# Patient Record
Sex: Male | Born: 1950
Health system: Southern US, Community
[De-identification: ages and names within clinical notes are randomized; demographics above are authoritative.]

## PROBLEM LIST (undated history)

## (undated) DIAGNOSIS — N400 Enlarged prostate without lower urinary tract symptoms: Secondary | ICD-10-CM

## (undated) DIAGNOSIS — C61 Malignant neoplasm of prostate: Secondary | ICD-10-CM

## (undated) DIAGNOSIS — F419 Anxiety disorder, unspecified: Secondary | ICD-10-CM

## (undated) DIAGNOSIS — E785 Hyperlipidemia, unspecified: Secondary | ICD-10-CM

## (undated) DIAGNOSIS — M1990 Unspecified osteoarthritis, unspecified site: Secondary | ICD-10-CM

## (undated) DIAGNOSIS — T7840XA Allergy, unspecified, initial encounter: Secondary | ICD-10-CM

## (undated) DIAGNOSIS — R7303 Prediabetes: Secondary | ICD-10-CM

## (undated) DIAGNOSIS — K635 Polyp of colon: Secondary | ICD-10-CM

## (undated) DIAGNOSIS — I10 Essential (primary) hypertension: Secondary | ICD-10-CM

## (undated) DIAGNOSIS — Z87442 Personal history of urinary calculi: Secondary | ICD-10-CM

## (undated) DIAGNOSIS — N200 Calculus of kidney: Secondary | ICD-10-CM

## (undated) DIAGNOSIS — N402 Nodular prostate without lower urinary tract symptoms: Secondary | ICD-10-CM

## (undated) DIAGNOSIS — E669 Obesity, unspecified: Secondary | ICD-10-CM

## (undated) DIAGNOSIS — K5790 Diverticulosis of intestine, part unspecified, without perforation or abscess without bleeding: Secondary | ICD-10-CM

## (undated) HISTORY — DX: Polyp of colon: K63.5

## (undated) HISTORY — DX: Hyperlipidemia, unspecified: E78.5

## (undated) HISTORY — PX: TRANSURETHRAL RESECTION OF PROSTATE: SHX73

## (undated) HISTORY — DX: Unspecified osteoarthritis, unspecified site: M19.90

## (undated) HISTORY — PX: HERNIA REPAIR: SHX51

## (undated) HISTORY — DX: Diverticulosis of intestine, part unspecified, without perforation or abscess without bleeding: K57.90

## (undated) HISTORY — DX: Obesity, unspecified: E66.9

## (undated) HISTORY — DX: Allergy, unspecified, initial encounter: T78.40XA

## (undated) HISTORY — DX: Benign prostatic hyperplasia without lower urinary tract symptoms: N40.0

## (undated) HISTORY — PX: JOINT REPLACEMENT: SHX530

## (undated) HISTORY — DX: Anxiety disorder, unspecified: F41.9

## (undated) HISTORY — PX: ANKLE FUSION: SHX881

## (undated) HISTORY — DX: Calculus of kidney: N20.0

## (undated) HISTORY — DX: Nodular prostate without lower urinary tract symptoms: N40.2

---
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
## 1989-06-10 HISTORY — PX: ANKLE FUSION: SHX881

## 1999-06-15 ENCOUNTER — Ambulatory Visit (HOSPITAL_COMMUNITY): Admission: RE | Admit: 1999-06-15 | Discharge: 1999-06-15 | Payer: Self-pay | Admitting: *Deleted

## 1999-06-15 ENCOUNTER — Encounter (INDEPENDENT_AMBULATORY_CARE_PROVIDER_SITE_OTHER): Payer: Self-pay | Admitting: Specialist

## 2002-01-19 ENCOUNTER — Encounter: Payer: Self-pay | Admitting: Urology

## 2002-01-19 ENCOUNTER — Encounter: Admission: RE | Admit: 2002-01-19 | Discharge: 2002-01-19 | Payer: Self-pay | Admitting: Urology

## 2002-01-28 ENCOUNTER — Ambulatory Visit (HOSPITAL_BASED_OUTPATIENT_CLINIC_OR_DEPARTMENT_OTHER): Admission: RE | Admit: 2002-01-28 | Discharge: 2002-01-28 | Payer: Self-pay | Admitting: Urology

## 2002-01-28 ENCOUNTER — Encounter (INDEPENDENT_AMBULATORY_CARE_PROVIDER_SITE_OTHER): Payer: Self-pay | Admitting: Specialist

## 2002-02-02 ENCOUNTER — Emergency Department (HOSPITAL_COMMUNITY): Admission: EM | Admit: 2002-02-02 | Discharge: 2002-02-02 | Payer: Self-pay | Admitting: Emergency Medicine

## 2002-02-07 ENCOUNTER — Emergency Department (HOSPITAL_COMMUNITY): Admission: EM | Admit: 2002-02-07 | Discharge: 2002-02-07 | Payer: Self-pay | Admitting: Emergency Medicine

## 2002-02-10 ENCOUNTER — Emergency Department (HOSPITAL_COMMUNITY): Admission: EM | Admit: 2002-02-10 | Discharge: 2002-02-10 | Payer: Self-pay | Admitting: Emergency Medicine

## 2003-08-08 ENCOUNTER — Inpatient Hospital Stay (HOSPITAL_COMMUNITY): Admission: RE | Admit: 2003-08-08 | Discharge: 2003-08-09 | Payer: Self-pay | Admitting: Urology

## 2003-08-08 ENCOUNTER — Encounter (INDEPENDENT_AMBULATORY_CARE_PROVIDER_SITE_OTHER): Payer: Self-pay | Admitting: Specialist

## 2004-08-15 ENCOUNTER — Inpatient Hospital Stay (HOSPITAL_COMMUNITY): Admission: EM | Admit: 2004-08-15 | Discharge: 2004-08-18 | Payer: Self-pay | Admitting: Emergency Medicine

## 2004-08-16 ENCOUNTER — Encounter (INDEPENDENT_AMBULATORY_CARE_PROVIDER_SITE_OTHER): Payer: Self-pay | Admitting: Specialist

## 2005-11-27 ENCOUNTER — Emergency Department (HOSPITAL_COMMUNITY): Admission: EM | Admit: 2005-11-27 | Discharge: 2005-11-28 | Payer: Self-pay | Admitting: Emergency Medicine

## 2008-01-09 ENCOUNTER — Encounter
Admission: RE | Admit: 2008-01-09 | Discharge: 2008-01-09 | Payer: Self-pay | Admitting: Physical Medicine and Rehabilitation

## 2008-07-27 DIAGNOSIS — N402 Nodular prostate without lower urinary tract symptoms: Secondary | ICD-10-CM

## 2008-07-27 HISTORY — DX: Nodular prostate without lower urinary tract symptoms: N40.2

## 2008-10-04 ENCOUNTER — Ambulatory Visit: Payer: Self-pay | Admitting: *Deleted

## 2009-06-10 HISTORY — PX: COLONOSCOPY: SHX174

## 2009-06-10 HISTORY — PX: POLYPECTOMY: SHX149

## 2009-07-31 ENCOUNTER — Encounter: Payer: Self-pay | Admitting: Internal Medicine

## 2009-08-18 ENCOUNTER — Encounter (INDEPENDENT_AMBULATORY_CARE_PROVIDER_SITE_OTHER): Payer: Self-pay | Admitting: *Deleted

## 2009-08-21 ENCOUNTER — Ambulatory Visit: Payer: Self-pay | Admitting: Internal Medicine

## 2009-09-01 ENCOUNTER — Telehealth: Payer: Self-pay | Admitting: Internal Medicine

## 2009-09-04 ENCOUNTER — Ambulatory Visit: Payer: Self-pay | Admitting: Internal Medicine

## 2009-09-04 LAB — HM COLONOSCOPY

## 2009-09-05 ENCOUNTER — Encounter: Payer: Self-pay | Admitting: Internal Medicine

## 2009-09-07 ENCOUNTER — Encounter: Payer: Self-pay | Admitting: Internal Medicine

## 2010-07-10 NOTE — Miscellaneous (Signed)
Summary: changed from moviprep to miralax  Clinical Lists Changes  Medications: Added new medication of MIRALAX   POWD (POLYETHYLENE GLYCOL 3350) As directed - Signed Added new medication of REGLAN 10 MG  TABS (METOCLOPRAMIDE HCL) As directed - Signed Added new medication of DULCOLAX 5 MG  TBEC (BISACODYL) As directed - Signed Rx of MIRALAX   POWD (POLYETHYLENE GLYCOL 3350) As directed;  #255 gms x 0;  Signed;  Entered by: Clide Cliff RN;  Authorized by: Hart Carwin MD;  Method used: [facility] [address] [phone]*, [address] [place], [place], [place]  [address], Ph: [phone] or [phone], Fax: [phone] Rx of REGLAN 10 MG  TABS (METOCLOPRAMIDE HCL) As directed;  #2 x 0;  Signed;  Entered by: Clide Cliff RN;  Authorized by: Hart Carwin MD;  Method used: [facility]ically to [facility] [address] [phone]*, [address], [place], [place], [place]  [address], Ph: [phone] or [phone], Fax: [phone] Rx of DULCOLAX 5 MG  TBEC (BISACODYL) As directed;  #4 x 1;  Signed;  Entered by: Clide Cliff RN;  Authorized by: Hart Carwin MD;  Method used: Electronically to [facility] [address] [phone]*, [address], [place], [place], [place]  [address], Ph: [phone] or [phone], Fax: [phone] Observations: Added new observation of ALLERGY REV: Done (08/21/2009 13:18)    DO NOT GIVE THE PATIENT MOVIPREP AS PREVIOUSLY ORDERED.  Prescriptions: DULCOLAX 5 MG  TBEC (BISACODYL) As directed  #4 x 1   Entered by:   Clide Cliff RN   Authorized by:   Hart Carwin MD   Signed by:   Clide Cliff RN on 08/21/2009   Method used:   Electronically to        [facility] [phone]* (retail)       [address]       [place], [place]  [address]       Ph: [phone] or [phone]       Fax: [phone]   RxID:   [id number] REGLAN 10 MG  TABS (METOCLOPRAMIDE HCL) As directed  #2  x 0   Entered by:   Clide Cliff RN   Authorized by:   Hart Carwin MD   Signed by:   Clide Cliff RN on 08/21/2009   Method used:   Electronically to        [facility] [address] [phone]* (retail)       [address]       [place], [place]  [address]       Ph: [phone] or [phone]       Fax: [phone]   RxID:   [id number] MIRALAX   POWD (POLYETHYLENE GLYCOL 3350) As directed  #255 gms x 0   Entered by:   Clide Cliff RN   Authorized by:   Hart Carwin MD   Signed by:   Clide Cliff RN on 08/21/2009   Method used:   Electronically to        [facility] [address] [phone]* (retail)       [address]       [place], [place]  [address]       Ph: [phone] or [phone]       Fax: [phone]   RxID:  [id number]  

## 2010-07-10 NOTE — Miscellaneous (Signed)
Summary: ROUTINE COL .[name] KitchenMarland KitchenEM  Clinical Lists Changes  Medications: Added new medication of MOVIPREP 100 GM  SOLR (PEG-KCL-NACL-NASULF-NA ASC-C) As directed - Signed Rx of MOVIPREP 100 GM  SOLR (PEG-KCL-NACL-NASULF-NA ASC-C) As directed;  #1 x 0;  Signed;  Entered by: Clide Cliff RN;  Authorized by: Hart Carwin MD;  Method used: Electronically to [facility] [address] [phone]*, [address] [place], [place], [place]  [address], Ph: [phone] or [phone], Fax: [phone] Allergies: Added new allergy or adverse reaction of ASA Added new allergy or adverse reaction of * STATINS Observations: Added new observation of NKA: F (08/21/2009 12:46)    Prescriptions: MOVIPREP 100 GM  SOLR (PEG-KCL-NACL-NASULF-NA ASC-C) As directed  #1 x 0   Entered by:   Clide Cliff RN   Authorized by:   Hart Carwin MD   Signed by:   Clide Cliff RN on 08/21/2009   Method used:   Electronically to        [facility] [phone]* (retail)       [address]       [place], [place]  [address]       Ph: [phone] or [phone]       Fax: [phone]   RxID:   [id number]

## 2010-07-10 NOTE — Procedures (Signed)
Summary: Colonoscopy  Patient: [name] Note: All result statuses are Final unless otherwise noted.  Tests: (1) Colonoscopy (COL)   COL Colonoscopy           DONE     [facility]     [address]     [place], [place]  [address]           COLONOSCOPY PROCEDURE REPORT           PATIENT:  [name] [name], [name] [name]  MR#:  [id number]     BIRTHDATE:  1950/12/25, 59 yrs. old  GENDER:  male     ENDOSCOPIST:  Hedwig Morton. Juanda Chance, MD     REF. BY:  Robert Bellow, M.D.     PROCEDURE DATE:  09/04/2009     PROCEDURE:  Colonoscopy 19147     ASA CLASS:  Class II     INDICATIONS:  Routine Risk Screening hx of colon polyps, no     records available     MEDICATIONS:   Versed 4 mg, Fentanyl 50 mcg           DESCRIPTION OF PROCEDURE:   After the risks benefits and     alternatives of the procedure were thoroughly explained, informed     consent was obtained.  Digital rectal exam was performed and     revealed no rectal masses.   The LB CF-H180AL [id number] endoscope     was introduced through the anus and advanced to the cecum, which     was identified by the ileocecal valve, without limitations.  The     quality of the prep was good, using MiraLax.  The instrument was     then slowly withdrawn as the colon was fully examined.     <<PROCEDUREIMAGES>>           FINDINGS:  Three polyps were found in the sigmoid colon. 3 polyps     at 20, 28, 30 cm sizes 3-6 mm The polyp was removed using cold     biopsy forceps. Polyp was snared without cautery. Retrieval was     successful (see image1, image2, image9, and image8). snare polyp     There were mild diverticular changes in left colon. diverticulosis     was found (see image3 and image7).  This was otherwise a normal     examination of the colon (see image10, image6, image5, and     image4). cecal pouch vis from outside the pouch   Retroflexed     views in the rectum revealed no abnormalities.    The scope was     then withdrawn from the patient and  the procedure completed.           COMPLICATIONS:  None     ENDOSCOPIC IMPRESSION:     1) Three polyps in the sigmoid colon     2) Diverticulosis,mild,left sided diverticulosis     3) Otherwise normal examination     RECOMMENDATIONS:     1) Await pathology results     2) high fiber diet     REPEAT EXAM:  In 5 year(s) for.           ______________________________     Hedwig Morton. Juanda Chance, MD           CC:           n.     eSIGNED:   Hedwig Morton. Brodie at 09/04/2009 10:11 AM  Dekker, Verga, [phone]  Note: An exclamation mark (!) indicates a result that was not dispersed into the flowsheet. Document Creation Date: 09/04/2009 10:12 AM _______________________________________________________________________  (1) Order result status: Final Collection or observation date-time: 09/04/2009 10:03 Requested date-time:  Receipt date-time:  Reported date-time:  Referring Physician:   Ordering Physician: Lina Sar [phone]) Specimen Source:  Source: Launa Grill Order Number: [id number] Lab site:   Appended Document: Colonoscopy     Procedures Next Due Date:    Colonoscopy: 08/2016

## 2010-07-10 NOTE — Progress Notes (Signed)
Summary: Not sure which prep to do  Phone Note Call from Patient Call back at Home Phone [phone]   Call For: Dr Juanda Chance Reason for Call: Talk to Nurse Summary of Call: Got Moviprep from drugstore but Miralax instructions. Initial call taken by: Leanor Kail [facility],  September 01, 2009 9:56 AM  Follow-up for Phone Call        Spoke with patient, encouraged him to take back moviprep. he states he picked both preps up. told patient he should use the miralax prep becaused this is the prep instructions he has, he agrees. Follow-up by: Sherren Kerns RN,  September 01, 2009 10:06 AM

## 2010-07-10 NOTE — Letter (Signed)
Summary: Patient Notice- Polyp Results  [facility]  [address] [place], [place] [address]   Phone: [phone]  Fax: [phone]        September 07, 2009 MRN: [id number]    [name] [name] [address] [place], [place]  [address]    Dear [name],  I am pleased to inform you that the colon polyp(s) removed during your recent colonoscopy was (were) found to be benign (no cancer detected) upon pathologic examination.The polyp was hyperplastic ( not precancerous)  I recommend you have a repeat colonoscopy examination in 7_ years to look for recurrent polyps, as having colon polyps increases your risk for having recurrent polyps or even colon cancer in the future.  Should you develop new or worsening symptoms of abdominal pain, bowel habit changes or bleeding from the rectum or bowels, please schedule an evaluation with either your primary care physician or with me.  Additional information/recommendations:  _x_ No further action with gastroenterology is needed at this time. Please      follow-up with your primary care physician for your other healthcare      needs.  __ Please call [phone] to schedule a return visit to review your      situation.  __ Please keep your follow-up visit as already scheduled.  __ Continue treatment plan as outlined the day of your exam.  Please call us if you are having persistent problems or have questions about your condition that have not been fully answered at this time.  Sincerely,  Hart Carwin MD  This letter has been electronically signed by your physician.  Appended Document: Patient Notice- Polyp Results Letter mailed 4.1.11

## 2010-07-10 NOTE — Letter (Signed)
Summary: [facility] Instructions  [facility]  [address] [place], [place] [address]   Phone: [phone]  Fax: [phone]       [name] [name]    [birth year]    MRN: [id number]       Procedure Day /Date:  Monday 09/04/2009     Arrival Time:  8:00 am     Procedure Time: 9:00 am     Location of Procedure:                    _x _  [facility] (4th Floor)    PREPARATION FOR COLONOSCOPY WITH MIRALAX  Starting 5 days prior to your procedure Wednesday 3/23 do not eat nuts, seeds, popcorn, corn, beans, peas,  salads, or any raw vegetables.  Do not take any fiber supplements (e.g. Metamucil, Citrucel, and Benefiber). ____________________________________________________________________________________________________   THE DAY BEFORE YOUR PROCEDURE         DATE: Sunday 3/27  1   Drink clear liquids the entire day-NO SOLID FOOD  2   Do not drink anything colored red or purple.  Avoid juices with pulp.  No orange juice.  3   Drink at least 64 oz. (8 glasses) of fluid/clear liquids during the day to prevent dehydration and help the prep work efficiently.  CLEAR LIQUIDS INCLUDE: Water Jello Ice Popsicles Tea (sugar ok, no milk/cream) Powdered fruit flavored drinks Coffee (sugar ok, no milk/cream) Gatorade Juice: apple, white grape, white cranberry  Lemonade Clear bullion, consomm, broth Carbonated beverages (any kind) Strained chicken noodle soup Hard Candy  4   Mix the entire bottle of Miralax with 64 oz. of Gatorade/Powerade in the morning and put in the refrigerator to chill.  5   At 3:00 pm take 2 Dulcolax/Bisacodyl tablets.  6   At 4:30 pm take one Reglan/Metoclopramide tablet.  7  Starting at 5:00 pm drink one 8 oz glass of the Miralax mixture every 15-20 minutes until you have finished drinking the entire 64 oz.  You should finish drinking prep around 7:30 or 8:00 pm.  8   If you are nauseated, you may take the 2nd Reglan/Metoclopramide  tablet at 6:30 pm.        9    At 8:00 pm take 2 more DULCOLAX/Bisacodyl tablets.     THE DAY OF YOUR PROCEDURE      DATE:  Monday 3/28  You may drink clear liquids until 7:00 am  (2 HOURS BEFORE PROCEDURE).   MEDICATION INSTRUCTIONS  Unless otherwise instructed, you should take regular prescription medications with a small sip of water as early as possible the morning of your procedure.  Diabetic patients - see separate instructions.   Additional medication instructions:  hold lisinopril/hctz the am of the procedure.         OTHER INSTRUCTIONS  You will need a responsible adult at least 58 years of age to accompany you and drive you home.   This person must remain in the waiting room during your procedure.  Wear loose fitting clothing that is easily removed.  Leave jewelry and other valuables at home.  However, you may wish to [name] a book to read or an iPod/MP3 player to listen to music as you wait for your procedure to start.  Remove all body piercing jewelry and leave at home.  Total time from sign-in until discharge is approximately 2-3 hours.  You should go home directly after your procedure and rest.  You can resume normal  activities the day after your procedure.  The day of your procedure you should not:   Drive   Make legal decisions   Operate machinery   Drink alcohol   Return to work  You will receive specific instructions about eating, activities and medications before you leave.   The above instructions have been reviewed and explained to me by   Clide Cliff, RN______________________    I fully understand and can verbalize these instructions _____________________________ Date _______

## 2010-07-10 NOTE — Letter (Signed)
Summary: [facility]  [facility]   Imported By: Lester [place] 09/20/2009 07:53:19  _____________________________________________________________________  External Attachment:    Type:   Image     Comment:   External Document

## 2010-07-10 NOTE — Letter (Signed)
Summary: Patient Notice- Polyp Results   [facility]  [address] [place], [place] [address]   Phone: [phone]  Fax: [phone]        September 05, 2009 MRN: [id number]    [name] [name] [address] [place], [place]  [address]    Dear [name],  I am pleased to inform you that the colon polyp(s) removed during your recent colonoscopy was (were) found to be benign (no cancer detected) upon pathologic examination.All polyps were hyperplastic ( not precancerous)  I recommend you have a repeat colonoscopy examination in 7_ years to look for recurrent polyps, as having colon polyps increases your risk for having recurrent polyps or even colon cancer in the future.  Should you develop new or worsening symptoms of abdominal pain, bowel habit changes or bleeding from the rectum or bowels, please schedule an evaluation with either your primary care physician or with me.  Additional information/recommendations:  _7_ No further action with gastroenterology is needed at this time. Please      follow-up with your primary care physician for your other healthcare      needs.  __ Please call [phone] to schedule a return visit to review your      situation.  __ Please keep your follow-up visit as already scheduled.  __ Continue treatment plan as outlined the day of your exam.  Please call us if you are having persistent problems or have questions about your condition that have not been fully answered at this time.  Sincerely,  Hart Carwin MD  This letter has been electronically signed by your physician.  Appended Document: Patient Notice- Polyp Results letter mailed 3.30.11

## 2010-10-23 NOTE — Procedures (Signed)
DUPLEX DEEP VENOUS EXAM - LOWER EXTREMITY   INDICATION:  Bilateral lower extremity pain.   HISTORY:  Edema:  No.  Trauma/Surgery:  No.  Pain:  Yes.  PE:  No.  Previous DVT:  No.  Anticoagulants:  No.  Other:   DUPLEX EXAM:                CFV   SFV   PopV  PTV    GSV                R  L  R  L  R  L  R   L  R  L  Thrombosis    o  o  o  o  o  o  o   o  o  o  Spontaneous   +  +  +  +  +  +  +   +  +  +  Phasic        +  +  +  +  +  +  +   +  +  +  Augmentation  +  +  +  +  +  +  +   +  +  +  Compressible  +  +  +  +  +  +  +   +  +  +  Competent     +  +  +  +  +  +  +   +  +  +   Legend:  + - yes  o - no  p - partial  D - decreased   IMPRESSION:  No evidence of deep or superficial vein thrombosis.    _____________________________  P. Liliane Bade, M.D.   AC/MEDQ  D:  10/04/2008  T:  10/04/2008  Job:  [id number]

## 2010-10-26 NOTE — Consult Note (Signed)
   NAMEFRANKIE, SCIPIO NO.:  [id number]   MEDICAL RECORD NO.:  [id number]                   PATIENT TYPE:  EMS   LOCATION:  ED                                   FACILITY:  [facility]   PHYSICIAN:  Lindaann Slough, M.D.               DATE OF BIRTH:  19-Sep-1950   DATE OF CONSULTATION:  DATE OF DISCHARGE:                                   CONSULTATION   REASON FOR CONSULTATION:  Urinary retention and gross hematuria.   HISTORY OF PRESENT ILLNESS:  The patient is a 60 year old male who has been  having difficulty voiding since yesterday. He has been voiding small amounts  of urine at a time. He was seen in the emergency room this evening and was  catheterized for 1200 cc of bloody urine. The patient had incision of  bladder neck and removal of bladder stone on January 28, 2002. He went into  urinary retention after the removal of the Foley catheter and the catheter  was left indwelling for a few days. It was removed again four days ago and  he started having difficulty voiding yesterday. He has been on Flomax.   PHYSICAL EXAMINATION:  VITAL SIGNS: Blood pressure is 157/110, pulse 66,  respiratory rate 16, temperature 97.4.  GU: Penis is uncircumcised. Meatus is normal. Scrotum unremarkable.  Testicles and cords and epididymis are within normal limits.  RECTAL: Examination is deferred.   IMPRESSION:  1. Clots with urinary retention.  2. Benign prostatic hypertrophy.  3. Bladder stones.   SUGGESTION:  1. Discharge to home with Foley catheter to leg bag.  2. Septra DS one tablet twice a day.  3. Increase Flomax to 2 capsules daily at HS.  4. Follow-up with Dr. Patsi Sears.                                                 Lindaann Slough, M.D.    MN/MEDQ  D:  02/07/2002  T:  02/07/2002  Job:  [id number]

## 2010-10-26 NOTE — Op Note (Signed)
[name] [name], [name] [name] NO.:  [id number]   MEDICAL RECORD NO.:  [id number]          PATIENT TYPE:  INP   LOCATION:  0349                         FACILITY:  [facility]   PHYSICIAN:  Georgiana Spinner, M.D.    DATE OF BIRTH:  12/20/1950   DATE OF PROCEDURE:  08/16/2004  DATE OF DISCHARGE:                                 OPERATIVE REPORT   PROCEDURE:  Upper endoscopy.   INDICATIONS:  Acute gastrointestinal bleeding for which I admitted him to  the hospital.   ANESTHESIA:  Demerol 50 mg, Versed 5 mg.   PROCEDURE:  With the patient mildly sedated in the left lateral decubitus  position, the Olympus videoscopic endoscope was inserted and passed under  direct vision through the esophagus which appeared normal into the stomach.  Fundus, body, and antrum appeared normal other than what appeared to be  either a pancreatic rest or umbilicated fold in the antrum which was  photographed and biopsied. We entered into the duodenal bulb and second  portion duodenum, both which appeared normal. From this point, the endoscope  was slowly withdrawn taking circumferential views of duodenal mucosa until  the endoscope had been pulled back in the stomach placed in retroflexion to  review the stomach from below. The endoscope was straightened and withdrawn,  taking circumferential views of remaining gastric esophageal mucosa. The  patient's vital signs and pulse oximeter remained stable. The patient  tolerated procedure well without apparent complications.   FINDINGS:  Umbilicated fold versus pancreatic rest of the antrum biopsied  and photographed. Await biopsy report. No evidence of acute gastrointestinal  bleeding. Plan will be to proceed with unprepped colonoscopy.      GMO/MEDQ  D:  08/16/2004  T:  08/16/2004  Job:  [id number]   cc:   Jonita Albee, M.D.  [facility]  [address]  [place]  [place] [address]  Fax: [phone]

## 2010-10-26 NOTE — Op Note (Signed)
NAMERAMBO, SARAFIAN NO.:  [id number]   MEDICAL RECORD NO.:  [id number]          PATIENT TYPE:  INP   LOCATION:  0349                         [facility]ACILITY[facility]  [facility]   PHYSICIAN:  Georgiana Spinner, M.D.    DATE OF BIRTH:  November 22, 1950   DATE OF PROCEDURE:  08/16/2004  DATE OF DISCHARGE:                                 OPERATIVE REPORT   PROCEDURE:  Colonoscopy.   INDICATIONS:  Rectal bleeding.   ANESTHESIA:  Demerol 25, Versed 2 mg.   PROCEDURE:  With the patient mildly sedated in the left lateral decubitus  position, the Olympus videoscopic colonoscope was inserted into the rectum  and passed under direct vision to above the splenic flexure. It was noted  that there was a transition from blood to brown at about the level of the  splenic flexure at approximately 50-60 cm of advancement from the anal verge  on the colonoscope.  We advanced further and the stool was clearly brown in  the transverse colon and became more copious. I felt that we had reached the  limit of benefit from this procedure. The endoscope was then withdrawn  taking circumferential views of the remaining colonic mucosa. The endoscope  was withdrawn. The patient's vital signs and pulse oximeter remained stable.  The patient tolerated procedure well without apparent complications.   FINDINGS:  No active bleeding at this time but blood seen in the left colon  from approximately the splenic flexure distally and brown stool seen  proximal to this.   PLAN:  Prep the patient for a colonoscopy which will plan on doing and  proceeding to do tomorrow.      GMO/MEDQ  D:  08/16/2004  T:  08/16/2004  Job:  [id number]   cc:   Jonita Albee, M.D.  [facility]  [address]  [place]  [place] [address]  Fax: [phone]

## 2010-10-26 NOTE — Op Note (Signed)
[name] [name], CELESTINE NO.:  [id number]   MEDICAL RECORD NO.:  [id number]                   PATIENT TYPE:  INP   LOCATION:  0342                                 FACILITY:  [facility]   PHYSICIAN:  Sigmund I. Patsi Sears, M.D.         DATE OF BIRTH:  [birth year]   DATE OF PROCEDURE:  08/08/2003  DATE OF DISCHARGE:  08/09/2003                                 OPERATIVE REPORT   PREOPERATIVE DIAGNOSES:  Benign prostatic hypertrophy and urethral  stricture.   POSTOPERATIVE DIAGNOSES:  Benign prostatic hypertrophy and urethral  stricture.   OPERATION:  Urethral dilation and transurethral resection of prostate.   SURGEON:  Sigmund I. Patsi Sears, M.D.   ANESTHESIA:  Spinal.   PREPARATION:  After appropriate preanesthesia, the patient was brought to  the operating room, placed on the operating table in the dorsal supine  position.  He was then replaced in the right lateral decubitus position  where a spinal anesthetic was introduced. He was then replaced in dorsal  lithotomy position where the pubis was prepped with Betadine solution and  draped in the usual fashion.   DESCRIPTION OF PROCEDURE:  Cystourethroscopy revealed dense distal urethral  stricture, and this was dilated with Sissy Hoff sounds. Following this,  cystoscopy revealed a large prostate which was resected beginning at the 11  o'clock to 7 o'clock position, and from the 2 o'clock and the 5 o'clock  positions. Chips were sent to the laboratory for evaluation. Following this,  a 71 Ainsworth catheter was passed, and the patient was then taken to the  recovery room in good condition.                                               Sigmund I. Patsi Sears, M.D.    SIT/MEDQ  D:  10/13/2003  T:  10/13/2003  Job:  [id number]

## 2010-10-26 NOTE — Op Note (Signed)
   [name] [name], [name] [name]                         ACCOUNT NO.:  [id number]   MEDICAL RECORD NO.:  [id number]                   PATIENT TYPE:  AMB   LOCATION:  DAY                                  FACILITY:  [facility]   PHYSICIAN:  Sigmund I. Patsi Sears, M.D.         DATE OF BIRTH:  01/27/1951   DATE OF PROCEDURE:  02/11/2002  DATE OF DISCHARGE:                                 OPERATIVE REPORT   PREOPERATIVE DIAGNOSIS:  Bladder neck obstruction with bladder stones.   POSTOPERATIVE DIAGNOSIS:  Bladder neck obstruction with bladder stones.   OPERATION:  1. Cystourethroscopy.  2. Cystolithalopaxy.  3. Transurethral bladder neck resection.   SURGEON:  Sigmund I. Patsi Sears, M.D.   ANESTHESIA:  General endotracheal.   PREPARATION:  After appropriate preanesthesia, the patient is brought to the  operating room and placed on the operating table in the dorsal supine  position where general endotracheal anesthesia was introduced.  He was then  re-placed in the dorsal lithotomy position where the pubis was prepped with  Betadine solution and draped in the usual fashion.   DESCRIPTION OF PROCEDURE:  Cystourethroscopy revealed a massive amount of  small egg-like stones within the bladder.  The bladder neck was markedly  elevated.  The bladder neck was first incised with a Collins knife then  resected with resectoscope, keeping the verumontanum in position through the  entire case.  Following this, bleeding was electrocoagulated, and multiple  stones were photographed and then removed with the piston syringe.  A Foley  catheter was placed to mild traction, but no continuous irrigation was  necessary.  The patient was awakened and taken to the recovery room in good  condition.                                               Sigmund I. Patsi Sears, M.D.    SIT/MEDQ  D:  03/19/2002  T:  03/19/2002  Job:  [id number]

## 2010-10-26 NOTE — Op Note (Signed)
NAMEMUNG, RINKER NO.:  [id number]   MEDICAL RECORD NO.:  [id number]          PATIENT TYPE:  INP   LOCATION:  0349                         FACILITY:  [facility]   PHYSICIAN:  Georgiana Spinner, M.D.    DATE OF BIRTH:  09-Mar-1951   DATE OF PROCEDURE:  08/17/2004  DATE OF DISCHARGE:                                 OPERATIVE REPORT   PROCEDURE:  Colonoscopy.   INDICATIONS:  Rectal bleeding.   ANESTHESIA:  Demerol 60, Versed 6 mg.   INDICATIONS FOR PROCEDURE:  Acute GI bleed.   DESCRIPTION OF PROCEDURE:  With the patient mildly sedated in the left  lateral decubitus position after a prep of MiraLax and Gatorade, the Olympus  videoscopic colonoscope was inserted into the rectum and passed under direct  vision to the cecum identified by ileocecal valve and appendiceal orifice  both of which were photographed. From this point the colonoscope was slowly  withdrawn taking circumferential views of colonic mucosa stopping to  photograph diverticulosis seen in the sigmoid colon which was moderately  significant until we reached the rectum which appeared normal on direct  showed and showed hemorrhoids on retroflexed view.  The endoscope was  straightened and withdrawn. The patient's vital signs, pulse oximeter  remained stable. The patient tolerated procedure well without apparent  complications.   FINDINGS:  Diverticulosis of the sigmoid colon, internal hemorrhoids  otherwise an unremarkable examination.   IMPRESSION:  Probable diverticular bleed now stopped.   PLAN:  If the patient remains stable will plan on discharge in the next 24  hours. Start the patient on iron therapy and have him follow-up with me as  an outpatient.      GMO/MEDQ  D:  08/17/2004  T:  08/17/2004  Job:  [id number]   cc:   Jonita Albee, M.D.  [facility]  [address]  [place]  [place] [address]  Fax: [phone]

## 2010-10-26 NOTE — Discharge Summary (Signed)
NAMETAYSHAWN, PURNELL NO.:  [id number]   MEDICAL RECORD NO.:  [id number]          PATIENT TYPE:  INP   LOCATION:  0349                         FACILITY:  [facility]   PHYSICIAN:  Georgiana Spinner, M.D.    DATE OF BIRTH:  12-Sep-1950   DATE OF ADMISSION:  08/15/2004  DATE OF DISCHARGE:  08/18/2004                                 DISCHARGE SUMMARY   Mr. Barto is a very pleasant 60 year old gentleman whom I had seen  previously, who was admitted to the hospital for acute gastrointestinal  bleeding.   HISTORY:  The patient was seen as an outpatient, had complained of some  small amount of rectal bleeding; however, that evening and the following  morning, the patient started bleeding significantly and was admitted to the  hospital on the 8th of March.  His past medical history is notable for  hypertension.  He was on aspirin and Lisinopril as his only medication, and  he had an allergy TO UNCOATED ASPIRIN.   FAMILY HISTORY:  Noncontributory.   No history of alcohol or tobacco use.  His only complaint besides the  bleeding was lower abdominal cramping.  When he was admitted, his pulse was  102 lying, went to 123 standing.  Blood pressure remained stable at 126/76,  and examination was notable only for red blood on rectal exam.  The patient  received during the hospital course 3 units of packed red blood cells.  On  the following morning, he underwent endoscopy and an unprepped colonoscopy.  Endoscopy was completely negative.  Colonoscopy revealed blood in the left  colon which turned to brown at the splenic flexure.  The following day, we  did a prepped colonoscopy since he remained fairly stable at that time and,  at that point, we found diverticulosis of the sigmoid colon, moderately  severe and internal hemorrhoids the only finding.  The patient remained  stable and was discharged the following day, March 11, for outpatient follow  up.  At that time, instructions were to  hold Lisinopril for 48 hours, drink  fluids, start on iron sulfate 325 mg t.i.d. with meals, and hold his aspirin  for 2 weeks.  Follow up will be with me in approximately 6 weeks as an  outpatient.   LABORATORY DATA:  Laboratory studies at this time revealed the following:  At discharge, his hemoglobin is 11.2, hematocrit 32.4.  His CMP on admission  showed a total protein of 5.5 with an albumin of 3, both slightly low.  Otherwise, an unremarkable examination.  His pro time PTT was normal.      GMO/MEDQ  D:  08/18/2004  T:  08/18/2004  Job:  [id number]   cc:   Jonita Albee, M.D.  [facility]  [address]  [place]  [place] [address]  Fax: [phone]

## 2011-01-28 LAB — LIPID PANEL
Cholesterol: 227 mg/dL — AB (ref 0–200)
LDl/HDL Ratio: 5.3

## 2011-03-18 ENCOUNTER — Emergency Department (HOSPITAL_BASED_OUTPATIENT_CLINIC_OR_DEPARTMENT_OTHER)
Admission: EM | Admit: 2011-03-18 | Discharge: 2011-03-18 | Disposition: A | Discharge status: Home / Self Care | Payer: BC Managed Care – PPO | Attending: Emergency Medicine | Admitting: Emergency Medicine

## 2011-03-18 ENCOUNTER — Encounter: Payer: Self-pay | Admitting: *Deleted

## 2011-03-18 DIAGNOSIS — E7800 Pure hypercholesterolemia, unspecified: Secondary | ICD-10-CM | POA: Insufficient documentation

## 2011-03-18 DIAGNOSIS — M5432 Sciatica, left side: Secondary | ICD-10-CM

## 2011-03-18 DIAGNOSIS — M25559 Pain in unspecified hip: Secondary | ICD-10-CM | POA: Insufficient documentation

## 2011-03-18 DIAGNOSIS — M5430 Sciatica, unspecified side: Secondary | ICD-10-CM | POA: Insufficient documentation

## 2011-03-18 DIAGNOSIS — I10 Essential (primary) hypertension: Secondary | ICD-10-CM | POA: Insufficient documentation

## 2011-03-18 DIAGNOSIS — Z79899 Other long term (current) drug therapy: Secondary | ICD-10-CM | POA: Insufficient documentation

## 2011-03-18 HISTORY — DX: Essential (primary) hypertension: I10

## 2011-03-18 MED ORDER — DIAZEPAM 5 MG PO TABS
5.0000 mg | ORAL_TABLET | Freq: Once | ORAL | Status: AC
  Administered 2011-03-18: 5 mg via ORAL
  Filled 2011-03-18: qty 1

## 2011-03-18 MED ORDER — IBUPROFEN 600 MG PO TABS: 600.0000 mg | ORAL_TABLET | Freq: Four times a day (QID) | ORAL | Status: AC | PRN

## 2011-03-18 MED ORDER — OXYCODONE-ACETAMINOPHEN 5-325 MG PO TABS: 1.0000 | ORAL_TABLET | ORAL | Status: AC | PRN

## 2011-03-18 MED ORDER — HYDROMORPHONE HCL 2 MG/ML IJ SOLN
2.0000 mg | Freq: Once | INTRAMUSCULAR | Status: AC
  Administered 2011-03-18: 2 mg via INTRAMUSCULAR
  Filled 2011-03-18: qty 1

## 2011-03-18 NOTE — ED Notes (Signed)
Dr. Campos at bedside   

## 2011-03-18 NOTE — ED Notes (Signed)
Pt able to ambulate around room. States the pain is at 7/10, but tolerable.

## 2011-03-18 NOTE — ED Notes (Signed)
Pt states he began a new workout routine last week, and was riding the bike and doing stair steps last Friday. Pain began that next morning to his left hip and thigh. Took tylenol yesterday without relief. Pt reports the discomfort/pain continues despite resting his leg for several days.

## 2011-03-18 NOTE — ED Notes (Signed)
Pt c/o lower back pain which radiates to left hip and down leg x 3 days.

## 2011-03-18 NOTE — ED Provider Notes (Signed)
History    Scribed for Lyanne Co, MD, the patient was seen in room MH07/MH07. This chart was scribed by Katha Cabal. This patient's care was started at 9:54 PM.  CSN: [id number] Arrival date & time: 03/18/2011  8:38 PM  Chief Complaint  Patient presents with  . Back Pain  . Hip Pain    (Consider location/radiation/quality/duration/timing/severity/associated sxs/prior treatment) HPI  [name] [name] is a 60 y.o. male who presents to the Emergency Department complaining of constant left hip pain that radiates to knee that began 3 days ago.  Denies weakness, numbness, urinary and bowel incontientce, painful BMs, fever, chills. back pain, abdominal pain, heavy lifting and hx of back surgery. Pain worsens while standing.  Tylenol doesn't help pain.  Per Wife:  Patient has been working out at [employer] Patient with hx of HTN, gout, and cholesterolemia and is compliant with medication.  No hx of AAA. Denies abdominal pain.  PCP Dr. Celedonio Savage      PAST MEDICAL HISTORY:  Past Medical History  Diagnosis Date  . Hypertension     PAST SURGICAL HISTORY:  Past Surgical History  Procedure Date  . Joint replacement   . Hernia repair     FAMILY HISTORY:  History reviewed. No pertinent family history.   SOCIAL HISTORY: History   Social History  . Marital Status: Married    Spouse Name: N/A    Number of Children: N/A  . Years of Education: N/A   Social History Main Topics  . Smoking status: Never Smoker   . Smokeless tobacco: None  . Alcohol Use: No  . Drug Use:   . Sexually Active: No   Other Topics Concern  . None   Social History Narrative  . None    Review of Systems  All other systems reviewed and are negative.    Allergies  Aspirin and Statins  Home Medications   Current Outpatient Rx  Name Route Sig Dispense Refill  . ASPIRIN EC 81 MG PO TBEC Oral Take 81 mg by mouth daily.      . B COMPLEX PO TABS Oral Take 1 tablet by mouth daily.      [name] Kitchen VITAMIN  D 1000 UNITS PO TABS Oral Take 1,000 Units by mouth daily.      . FEBUXOSTAT 40 MG PO TABS Oral Take 40 mg by mouth daily.      . OMEGA-3 FATTY ACIDS 1000 MG PO CAPS Oral Take 1 g by mouth daily.      [name] Kitchen LISINOPRIL-HYDROCHLOROTHIAZIDE 10-12.5 MG PO TABS Oral Take 1 tablet by mouth daily.      [name] Kitchen ONE-DAILY MULTI VITAMINS PO TABS Oral Take 1 tablet by mouth daily.      . TESTOSTERONE CYPIONATE 200 MG/ML IM OIL Intramuscular Inject into the muscle as needed.        BP 156/87  Pulse 60  Temp 99.3 F (37.4 C)  Resp 16  Ht 6\' 1"  (1.854 m)  Wt 289 lb (131.09 kg)  BMI 38.13 kg/m2  SpO2 98%  Physical Exam  Nursing note and vitals reviewed. Constitutional: He is oriented to person, place, and time. He appears well-developed and well-nourished.  HENT:  Head: Normocephalic and atraumatic.  Eyes: EOM are normal.  Neck: Normal range of motion.  Pulmonary/Chest: Effort normal. No respiratory distress.  Abdominal: Soft. He exhibits no distension. There is no tenderness. There is no rebound and no guarding.  Musculoskeletal: Normal range of motion.  No C, T, L spine tenderness.  Mild tenderness of sciatic grove, mild pain with external rotation of left hip, however, able to fully range left hip  Neurological: He is alert and oriented to person, place, and time.       nlm strength of left lower extremity   Skin: Skin is warm. No rash noted.  Psychiatric: He has a normal mood and affect. Judgment normal.    ED Course  Procedures (including critical care time) OTHER DATA REVIEWED: Nursing notes, vital signs, and past medical records reviewed.   DIAGNOSTIC STUDIES: Oxygen Saturation is 100% on room air, normal by my interpretation.     LABS / RADIOLOGY:  Labs Reviewed - No data to display  No results found.   ED COURSE / COORDINATION OF CARE:  9:59 PM  Physical exam complete.  Pain Control.          MDM   MDM: Patients symptoms seem most consistent with left-sided  sciatica.  He had no symptoms to suggest cauda equina.  Doubt AAA.  Doubt aortic dissection..Doubt spinal epidural abscess.    IMPRESSION: Diagnoses that have been ruled out:  Diagnoses that are still under consideration:  Final diagnoses:  Sciatica of left side     MEDICATIONS GIVEN IN THE E.D. Scheduled Meds:    . diazepam  5 mg Oral Once  .  HYDROmorphone (DILAUDID) injection  2 mg Intramuscular Once   Continuous Infusions:     DISCHARGE MEDICATIONS: New Prescriptions   IBUPROFEN (ADVIL,MOTRIN) 600 MG TABLET    Take 1 tablet (600 mg total) by mouth every 6 (six) hours as needed for pain.   OXYCODONE-ACETAMINOPHEN (PERCOCET) 5-325 MG PER TABLET    Take 1 tablet by mouth every 4 (four) hours as needed for pain.     I personally performed the services described in this documentation, which was scribed in my presence. The recorded information has been reviewed and considered.            Lyanne Co, MD 03/18/11 [phone]

## 2011-06-11 DIAGNOSIS — Z5189 Encounter for other specified aftercare: Secondary | ICD-10-CM

## 2011-06-11 HISTORY — DX: Encounter for other specified aftercare: Z51.89

## 2011-08-02 ENCOUNTER — Encounter: Payer: Self-pay | Admitting: *Deleted

## 2011-08-02 DIAGNOSIS — E669 Obesity, unspecified: Secondary | ICD-10-CM

## 2011-08-02 DIAGNOSIS — N402 Nodular prostate without lower urinary tract symptoms: Secondary | ICD-10-CM

## 2011-08-02 DIAGNOSIS — I152 Hypertension secondary to endocrine disorders: Secondary | ICD-10-CM | POA: Insufficient documentation

## 2011-08-02 DIAGNOSIS — N400 Enlarged prostate without lower urinary tract symptoms: Secondary | ICD-10-CM | POA: Insufficient documentation

## 2011-08-02 DIAGNOSIS — N200 Calculus of kidney: Secondary | ICD-10-CM | POA: Insufficient documentation

## 2011-08-02 DIAGNOSIS — I10 Essential (primary) hypertension: Secondary | ICD-10-CM | POA: Insufficient documentation

## 2011-08-05 ENCOUNTER — Ambulatory Visit (INDEPENDENT_AMBULATORY_CARE_PROVIDER_SITE_OTHER): Payer: BC Managed Care – PPO | Admitting: Internal Medicine

## 2011-08-05 ENCOUNTER — Encounter: Payer: Self-pay | Admitting: Internal Medicine

## 2011-08-05 VITALS — BP 134/83 | HR 65 | Temp 97.4°F | Resp 20 | Ht 73.5 in | Wt 290.0 lb

## 2011-08-05 DIAGNOSIS — Z7189 Other specified counseling: Secondary | ICD-10-CM

## 2011-08-05 DIAGNOSIS — N400 Enlarged prostate without lower urinary tract symptoms: Secondary | ICD-10-CM

## 2011-08-05 DIAGNOSIS — N529 Male erectile dysfunction, unspecified: Secondary | ICD-10-CM

## 2011-08-05 DIAGNOSIS — I10 Essential (primary) hypertension: Secondary | ICD-10-CM

## 2011-08-05 DIAGNOSIS — Z79899 Other long term (current) drug therapy: Secondary | ICD-10-CM

## 2011-08-05 DIAGNOSIS — Z0000 Encounter for general adult medical examination without abnormal findings: Secondary | ICD-10-CM

## 2011-08-05 DIAGNOSIS — R030 Elevated blood-pressure reading, without diagnosis of hypertension: Secondary | ICD-10-CM

## 2011-08-05 LAB — POCT UA - MICROSCOPIC ONLY
Mucus, UA: NEGATIVE
Yeast, UA: NEGATIVE

## 2011-08-05 LAB — POCT URINALYSIS DIPSTICK
Bilirubin, UA: NEGATIVE
Glucose, UA: NEGATIVE
Ketones, UA: NEGATIVE
Leukocytes, UA: NEGATIVE
Nitrite, UA: NEGATIVE

## 2011-08-05 LAB — POCT GLYCOSYLATED HEMOGLOBIN (HGB A1C): Hemoglobin A1C: 5.5

## 2011-08-05 NOTE — Progress Notes (Signed)
  Subjective:    Patient ID: [name] [name], male    DOB: [birth year], 61 y.o.   MRN: [id number]  HPI Feels good. HTN, ED, BPH Insomnia all controlled and tx. See scanned survey.   Review of Systems  See scanned ROS    Objective:   Physical Exam Obesity Normal exam       Assessment & Plan:   Refill meds 1 year No change in meds

## 2011-08-05 NOTE — Patient Instructions (Signed)
Back Exercises Back exercises help treat and prevent back injuries. The goal of back exercises is to increase the strength of your abdominal and back muscles and the flexibility of your back. These exercises should be started when you no longer have back pain. Back exercises include:  Pelvic Tilt. Lie on your back with your knees bent. Tilt your pelvis until the lower part of your back is against the floor. Hold this position 5 to 10 sec and repeat 5 to 10 times.   Knee to Chest. Pull first 1 knee up against your chest and hold for 20 to 30 seconds, repeat this with the other knee, and then both knees. This may be done with the other leg straight or bent, whichever feels better.   Sit-Ups or Curl-Ups. Bend your knees 90 degrees. Start with tilting your pelvis, and do a partial, slow sit-up, lifting your trunk only 30 to 45 degrees off the floor. Take at least 2 to 3 seconds for each sit-up. Do not do sit-ups with your knees out straight. If partial sit-ups are difficult, simply do the above but with only tightening your abdominal muscles and holding it as directed.   Hip-Lift. Lie on your back with your knees flexed 90 degrees. Push down with your feet and shoulders as you raise your hips a couple inches off the floor; hold for 10 seconds, repeat 5 to 10 times.   Back arches. Lie on your stomach, propping yourself up on bent elbows. Slowly press on your hands, causing an arch in your low back. Repeat 3 to 5 times. Any initial stiffness and discomfort should lessen with repetition over time.   Shoulder-Lifts. Lie face down with arms beside your body. Keep hips and torso pressed to floor as you slowly lift your head and shoulders off the floor.  Do not overdo your exercises, especially in the beginning. Exercises may cause you some mild back discomfort which lasts for a few minutes; however, if the pain is more severe, or lasts for more than 15 minutes, do not continue exercises until you see your  caregiver. Improvement with exercise therapy for back problems is slow.  See your caregivers for assistance with developing a proper back exercise program. Document Released: 07/04/2004 Document Revised: 01/23/2011 Document Reviewed: 05/27/2005 [employer] Patient Information 2012 [employer]. 

## 2011-08-06 LAB — CBC WITH DIFFERENTIAL/PLATELET
Lymphocytes Relative: 38 % (ref 12–46)
Lymphs Abs: 1.8 10*3/uL (ref 0.7–4.0)
Neutrophils Relative %: 53 % (ref 43–77)
Platelets: 186 10*3/uL (ref 150–400)
RBC: 4.98 MIL/uL (ref 4.22–5.81)
WBC: 4.7 10*3/uL (ref 4.0–10.5)

## 2011-08-06 LAB — COMPREHENSIVE METABOLIC PANEL
ALT: 37 U/L (ref 0–53)
CO2: 26 mEq/L (ref 19–32)
Calcium: 9.7 mg/dL (ref 8.4–10.5)
Chloride: 105 mEq/L (ref 96–112)
Potassium: 3.8 mEq/L (ref 3.5–5.3)
Sodium: 140 mEq/L (ref 135–145)
Total Protein: 6.9 g/dL (ref 6.0–8.3)

## 2011-08-06 LAB — LIPID PANEL
Cholesterol: 211 mg/dL — ABNORMAL HIGH (ref 0–200)
VLDL: 31 mg/dL (ref 0–40)

## 2011-08-06 LAB — TSH: TSH: 1.435 u[IU]/mL (ref 0.350–4.500)

## 2011-12-14 ENCOUNTER — Ambulatory Visit (INDEPENDENT_AMBULATORY_CARE_PROVIDER_SITE_OTHER): Payer: BC Managed Care – PPO | Admitting: Family Medicine

## 2011-12-14 VITALS — BP 144/84 | HR 81 | Temp 98.0°F | Resp 18 | Ht 73.0 in | Wt 284.0 lb

## 2011-12-14 DIAGNOSIS — M5430 Sciatica, unspecified side: Secondary | ICD-10-CM

## 2011-12-14 MED ORDER — OXYCODONE-ACETAMINOPHEN 5-325 MG PO TABS: 1.0000 | ORAL_TABLET | Freq: Three times a day (TID) | ORAL | Status: DC | PRN

## 2011-12-14 MED ORDER — PREDNISONE 20 MG PO TABS: ORAL_TABLET | ORAL | Status: DC

## 2011-12-14 NOTE — Progress Notes (Signed)
This is a 61 y.o.male who developed acute sciatica in left leg yesterday.   Patient has a past history of low back pain for which she   Mcguire Gasparyan denies any urinary symptoms, bowel problems, numbness in the legs, loss of motor power. [name] had no fever.  SLinwood Malizia has tried nothing.  Past Medical History  Diagnosis Date  . Hypertension   . BPH (benign prostatic hypertrophy)   . Obesity, unspecified   . Colon polyp     diverticular bleed with transfusion  . Kidney stones     15-20  . Prostate nodule [id number]  . Hyperlipidemia   . Anxiety      Past Surgical History  Procedure Date  . Joint replacement   . Hernia repair   . Transurethral resection of prostate   . Ankle fusion     Objective: Obese middle-aged male in no acute distress.  Palpation of the back reveals no localized tenderness.  Inspection of the back: Reveals no scoliosis  Straight-leg raising: Pain reproduced in the right back with left leg straightening well seated. No pain with right straight leg raising.  Motor exam of lower extremity: No abnormal weakness.  His left ankle is fused  Reflexes: Normal left KJ, right KJ, right AJ.  Left ankle is fused  Skin exam: Fading ecchymosis left brachial radialis area.  Assessment/Plan: Acute lower back pain without acute neurological findings consistent with sciatica on left  1. Sciatica  oxyCODONE-acetaminophen (ROXICET) 5-325 MG per tablet, predniSONE (DELTASONE) 20 MG tablet

## 2011-12-14 NOTE — Patient Instructions (Addendum)

## 2011-12-16 ENCOUNTER — Encounter: Payer: Self-pay | Admitting: Family Medicine

## 2011-12-16 ENCOUNTER — Ambulatory Visit (INDEPENDENT_AMBULATORY_CARE_PROVIDER_SITE_OTHER): Payer: BC Managed Care – PPO | Admitting: Family Medicine

## 2011-12-16 ENCOUNTER — Ambulatory Visit
Admission: RE | Admit: 2011-12-16 | Discharge: 2011-12-16 | Disposition: A | Discharge status: Home / Self Care | Payer: Self-pay | Source: Ambulatory Visit | Attending: Family Medicine | Admitting: Family Medicine

## 2011-12-16 VITALS — BP 98/70 | HR 102 | Temp 97.4°F | Resp 18 | Wt 286.0 lb

## 2011-12-16 DIAGNOSIS — M5430 Sciatica, unspecified side: Secondary | ICD-10-CM

## 2011-12-16 NOTE — Progress Notes (Signed)
This 61 year old man has acute left sciatica after moving awkwardly 4 days ago. On Saturday I gave him prednisone and Percocet he said no improvement in the pain. In addition to the sharp incapacitating pain, patient has paresthesias in his left thigh. He's had no change in bowel or bladder function, no fever, no stiff neck, no trauma, no previous back surgery , and no change in his muscle strength.  Objective: Patient is lying on his right side on his exam table in obvious pain. He's been using crutches. Back is nontender and shows no bony abnormality Straight-leg raising is exquisitely tender with left leg manipulation. Patient has good strength in his left leg. He has no significant loss of sensation in that left leg to touch.  Assessment acute unremitting sciatica of left leg  Plan: Stat open MRI today

## 2011-12-17 ENCOUNTER — Telehealth: Payer: Self-pay

## 2011-12-17 ENCOUNTER — Other Ambulatory Visit: Payer: Self-pay | Admitting: Family Medicine

## 2011-12-17 ENCOUNTER — Ambulatory Visit
Admission: RE | Admit: 2011-12-17 | Discharge: 2011-12-17 | Disposition: A | Discharge status: Home / Self Care | Payer: BC Managed Care – PPO | Source: Ambulatory Visit | Attending: Family Medicine | Admitting: Family Medicine

## 2011-12-17 ENCOUNTER — Ambulatory Visit: Admission: RE | Admit: 2011-12-17 | Payer: BC Managed Care – PPO | Source: Ambulatory Visit

## 2011-12-17 DIAGNOSIS — M5430 Sciatica, unspecified side: Secondary | ICD-10-CM

## 2011-12-17 DIAGNOSIS — M549 Dorsalgia, unspecified: Secondary | ICD-10-CM

## 2011-12-17 NOTE — Telephone Encounter (Signed)
The patient called again to request a pain medication for his sciatica that was exacerbated by the pressure applied this morning while trying to have MRI done.  The patient will also need to have the MRI rescheduled.  Please call patient at [phone].

## 2011-12-17 NOTE — Telephone Encounter (Signed)
PATIENT STATES HE WAS TO HAVE A MRI OF HIS (L) LEG TODAY, BUT HE COULD NOT STAND THE PRESSURE THEY HAD TO PUT ON IT. HE NEEDS TO HAVE IT RESCHEDULED, BUT IN THE MEAN TIME, HE REALLY NEEDS TO GET SOME STRONG MEDICATION CALLED INTO THE PHARMACY AS SOON AS POSSIBLE. DR. Delorise Jackson THE MRI. BEST PHONE [phone] (CELL)  PHARMACY CHOICE IS [facility] ([place] [address])   MBC

## 2011-12-17 NOTE — Telephone Encounter (Signed)
Patient is on Oxycodone. That should be quite strong enough. Patient will need to call and reschedule the appointment

## 2011-12-18 MED ORDER — OXYCODONE-ACETAMINOPHEN 10-325 MG PO TABS: 1.0000 | ORAL_TABLET | Freq: Three times a day (TID) | ORAL | Status: AC | PRN

## 2011-12-18 NOTE — Telephone Encounter (Signed)
Patient called again requesting pain medication.  He said the oxycodone is not helping and that Dr. Milus Glazier had talked about putting him on something stronger for the pain.  He was unable to get his MRI yesterday because he couldn't lay on his side.  Please call.

## 2011-12-18 NOTE — Telephone Encounter (Signed)
Please call in the percocet 10/325 #20 1 q6h prn

## 2011-12-18 NOTE — Telephone Encounter (Signed)
Left signed Rx in p/up drawer for pt p/up since it can not be called/sent in. Notified pt who agreed to p/up and then he will reschedule MRI when his pain is decreased.

## 2011-12-22 ENCOUNTER — Ambulatory Visit (INDEPENDENT_AMBULATORY_CARE_PROVIDER_SITE_OTHER): Payer: BC Managed Care – PPO | Admitting: Emergency Medicine

## 2011-12-22 ENCOUNTER — Ambulatory Visit
Admission: RE | Admit: 2011-12-22 | Discharge: 2011-12-22 | Disposition: A | Discharge status: Home / Self Care | Payer: BC Managed Care – PPO | Source: Ambulatory Visit | Attending: Family Medicine | Admitting: Family Medicine

## 2011-12-22 ENCOUNTER — Telehealth: Payer: Self-pay

## 2011-12-22 VITALS — BP 142/80 | HR 64 | Temp 97.8°F | Resp 16 | Wt 282.0 lb

## 2011-12-22 DIAGNOSIS — M549 Dorsalgia, unspecified: Secondary | ICD-10-CM

## 2011-12-22 MED ORDER — PREDNISONE 20 MG PO TABS: ORAL_TABLET | ORAL | Status: DC

## 2011-12-22 MED ORDER — HYDROCODONE-ACETAMINOPHEN 10-325 MG PO TABS: 1.0000 | ORAL_TABLET | ORAL | Status: AC | PRN

## 2011-12-22 NOTE — Telephone Encounter (Signed)
Pt request OOW note for tomorrow. Please call when ready.

## 2011-12-22 NOTE — Progress Notes (Signed)
  Subjective:    Patient ID: [name] [name], male    DOB: [birth year], 61 y.o.   MRN: [id number]  HPI patient states he has had severe low back pain. His pain is on the left and radiates down the left leg he has tried to go for a MRI but has been unable to lay flat to have the procedure done. He is receiving no relief from oxycodone he was given for pain he has persistent pain which radiates down the back of the left leg he denies any bowel or bladder problems. Patient had an MRI done in August of 2009 which showed a left foraminal disc protrusion and osteophyte spurring at L3-L4 with moderate changes at L5-S1. At that time he is under care of Dr. Regino Schultze and received epidural steroid injections.    Review of Systems     Objective:   Physical Exam physical exam reveals the patient laying in a fetal position complaining of severe low back pain. He did reflexes of the knees are 2+. He has a fused left ankle his right ankle reflexes 2+. He has straight leg raising positive findings on the left at approximately 40. Straight leg raising on the right was negative        Assessment & Plan:  Lumbar disc disease with foraminal disc and severe left-sided back pain with sciatica. We'll make referral to Dr. Regino Schultze in the morning. We'll try and give him pain relief with medications until then.

## 2011-12-23 NOTE — Telephone Encounter (Signed)
Note ready for pick up. Pt notified.  

## 2012-02-18 ENCOUNTER — Other Ambulatory Visit: Payer: Self-pay | Admitting: Family Medicine

## 2012-02-18 ENCOUNTER — Ambulatory Visit: Payer: BC Managed Care – PPO

## 2012-02-18 ENCOUNTER — Ambulatory Visit (INDEPENDENT_AMBULATORY_CARE_PROVIDER_SITE_OTHER): Payer: BC Managed Care – PPO | Admitting: Family Medicine

## 2012-02-18 VITALS — BP 122/80 | HR 99 | Temp 98.8°F | Resp 16 | Ht 73.5 in | Wt 283.0 lb

## 2012-02-18 DIAGNOSIS — R05 Cough: Secondary | ICD-10-CM

## 2012-02-18 MED ORDER — ALPRAZOLAM 1 MG PO TABS: 1.0000 mg | ORAL_TABLET | Freq: Every evening | ORAL | Status: DC | PRN

## 2012-02-18 MED ORDER — GUAIFENESIN-CODEINE 100-10 MG/5ML PO SYRP: 5.0000 mL | ORAL_SOLUTION | Freq: Three times a day (TID) | ORAL | Status: AC | PRN

## 2012-02-18 NOTE — Patient Instructions (Addendum)
Thank you for coming in today. I think you have a cold. Take Tylenol or ibuprofen as needed for sore throat. Additionally take cough syrup at night to help his sleep with your cough. Come back if not improving or worsening. Call or go to the emergency room if you get worse, have trouble breathing, have chest pains, or palpitations.

## 2012-02-18 NOTE — Progress Notes (Signed)
[name] [name] is a 61 y.o. male who presents to [facility] today for cough, headache, sore throat, congestion since Friday, and is worsening. No fevers or chills, or trouble breathing. Feels well otherwise. No sick contacts. Eating and drinking well. Has tried ibuprofen and Aleve which is not very much helpful. His most bothersome symptom is a sore throat and headache.  This is consistent with prior episodes of viral URIs.   PMH: Reviewed significant for hypertension, prostate nodule History  Substance Use Topics  . Smoking status: Never Smoker   . Smokeless tobacco: Not on file  . Alcohol Use: No   ROS as above  Medications reviewed. Current Outpatient Prescriptions  Medication Sig Dispense Refill  . ALPRAZolam (XANAX) 1 MG tablet Take 1 tablet (1 mg total) by mouth at bedtime as needed.  30 tablet  0  . aspirin EC 81 MG tablet Take 81 mg by mouth daily.        [name] Kitchen b complex vitamins tablet Take 1 tablet by mouth daily.        . cholecalciferol (VITAMIN D) 1000 UNITS tablet Take 1,000 Units by mouth daily.        . febuxostat (ULORIC) 40 MG tablet Take 40 mg by mouth daily.        . fish oil-omega-3 fatty acids 1000 MG capsule Take 1 g by mouth daily.        [name] Kitchen lisinopril-hydrochlorothiazide (PRINZIDE,ZESTORETIC) 10-12.5 MG per tablet Take 1 tablet by mouth daily.        . Multiple Vitamin (MULTIVITAMIN) tablet Take 1 tablet by mouth daily.        . sildenafil (VIAGRA) 100 MG tablet Take 100 mg by mouth as needed.      . testosterone cypionate (DEPOTESTOTERONE CYPIONATE) 200 MG/ML injection Inject into the muscle as needed.        [name] Kitchen guaiFENesin-codeine (ROBITUSSIN AC) 100-10 MG/5ML syrup Take 5 mLs by mouth 3 (three) times daily as needed for cough.  120 mL  0    Exam:  BP 122/80  Pulse 99  Temp 98.8 F (37.1 C)  Resp 16  Ht 6' 1.5" (1.867 m)  Wt 283 lb (128.368 kg)  BMI 36.83 kg/m2  SpO2 96% Gen: Well NAD HEENT: EOMI,  MMM Lungs: CTABL Nl WOB, occasional crackles in the right lower  base. Otherwise clear. No rhonchi or dullness to percussion Heart: RRR no MRG Abd: NABS, NT, ND Exts: Non edematous BL  LE, warm and well perfused.   Chest x-ray 2 view: No significant infiltrate noted. Mucus plugging present bilaterally with hilar fullness.  Assessment and Plan: 61 y.o. male with viral bronchitis versus URI.  Plan to treat symptomatically with Tylenol or ibuprofen. Additionally we'll treat cough with codeine and guaifenesin cough syrup. Discussed warning signs or symptoms. Please see discharge instructions. Patient expresses understanding.

## 2012-02-21 NOTE — Progress Notes (Signed)
Xray read and patient discussed with Dr. Denyse Amass. Agree with assessment and plan of care per his note. Xray report noted. IMPRESSION: Chronic bronchitic changes. No evidence of active pulmonary disease.

## 2012-04-20 ENCOUNTER — Other Ambulatory Visit: Payer: Self-pay | Admitting: Internal Medicine

## 2012-05-10 ENCOUNTER — Other Ambulatory Visit: Payer: Self-pay | Admitting: Physician Assistant

## 2012-06-07 ENCOUNTER — Other Ambulatory Visit: Payer: Self-pay | Admitting: Internal Medicine

## 2012-06-23 NOTE — Progress Notes (Signed)
Completed prior auth for pt's Viagra 100 mg over the phone and received approval for 36 mos. Faxed approval notice to pharmacy.

## 2012-08-24 ENCOUNTER — Other Ambulatory Visit: Payer: Self-pay | Admitting: Orthopedic Surgery

## 2012-08-24 DIAGNOSIS — M25562 Pain in left knee: Secondary | ICD-10-CM

## 2012-08-30 ENCOUNTER — Ambulatory Visit
Admission: RE | Admit: 2012-08-30 | Discharge: 2012-08-30 | Disposition: A | Discharge status: Home / Self Care | Payer: Managed Care, Other (non HMO) | Source: Ambulatory Visit | Attending: Orthopedic Surgery | Admitting: Orthopedic Surgery

## 2012-08-30 DIAGNOSIS — M25562 Pain in left knee: Secondary | ICD-10-CM

## 2012-09-19 ENCOUNTER — Ambulatory Visit (INDEPENDENT_AMBULATORY_CARE_PROVIDER_SITE_OTHER): Payer: Managed Care, Other (non HMO) | Admitting: Family Medicine

## 2012-09-19 VITALS — BP 151/91 | HR 65 | Temp 97.9°F | Resp 18 | Ht 74.5 in | Wt 291.0 lb

## 2012-09-19 DIAGNOSIS — R059 Cough, unspecified: Secondary | ICD-10-CM

## 2012-09-19 DIAGNOSIS — R05 Cough: Secondary | ICD-10-CM

## 2012-09-19 DIAGNOSIS — J209 Acute bronchitis, unspecified: Secondary | ICD-10-CM

## 2012-09-19 MED ORDER — HYDROCODONE-HOMATROPINE 5-1.5 MG/5ML PO SYRP: 5.0000 mL | ORAL_SOLUTION | Freq: Three times a day (TID) | ORAL | Status: DC | PRN

## 2012-09-19 MED ORDER — ALBUTEROL SULFATE HFA 108 (90 BASE) MCG/ACT IN AERS: 2.0000 | INHALATION_SPRAY | Freq: Four times a day (QID) | RESPIRATORY_TRACT | Status: DC | PRN

## 2012-09-19 MED ORDER — AZITHROMYCIN 250 MG PO TABS: ORAL_TABLET | ORAL | Status: DC

## 2012-09-19 NOTE — Progress Notes (Signed)
Patient ID: Toran Murch MRN: [id number], DOB: [birth year], 62 y.o. Date of Encounter: 09/19/2012, 10:42 AM  Primary Physician: Tally Due, MD  Chief Complaint:  Chief Complaint  Patient presents with  . Bronchitis    x 2 weeks   w/chest pain only when coughing     HPI: 62 y.o. year old male presents with a 14 day history of nasal congestion, post nasal drip, sore throat, and cough. Mild sinus pressure. Afebrile. No chills. Nasal congestion thick and green/yellow. Cough is productive of green/yellow sputum and not associated with time of day. Ears feel full, leading to sensation of muffled hearing. Has tried OTC cold preps without success. No GI complaints. Appetite okay  No sick contacts, recent antibiotics, or recent travels.   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  Past Medical History  Diagnosis Date  . Hypertension   . BPH (benign prostatic hypertrophy)   . Obesity, unspecified   . Colon polyp     diverticular bleed with transfusion  . Kidney stones     15-20  . Prostate nodule [id number]  . Hyperlipidemia   . Anxiety      Home Meds: Prior to Admission medications   Medication Sig Start Date End Date Taking? Authorizing Provider  ALPRAZolam Prudy Feeler) 1 MG tablet Take 1 tablet (1 mg total) by mouth at bedtime as needed for sleep. Needs OFFICE VISIT FOR MORE. 05/10/12  Yes Jonita Albee, MD  aspirin EC 81 MG tablet Take 81 mg by mouth daily.     Yes Historical Provider, MD  b complex vitamins tablet Take 1 tablet by mouth daily.     Yes Historical Provider, MD  cholecalciferol (VITAMIN D) 1000 UNITS tablet Take 1,000 Units by mouth daily.     Yes Historical Provider, MD  febuxostat (ULORIC) 40 MG tablet Take 40 mg by mouth daily.     Yes Historical Provider, MD  fish oil-omega-3 fatty acids 1000 MG capsule Take 1 g by mouth daily.     Yes Historical Provider, MD  lisinopril-hydrochlorothiazide  (PRINZIDE,ZESTORETIC) 10-12.5 MG per tablet TAKE 1 TABLET EVERY DAY FOR BLOOD PRESSURE 04/20/12  Yes Nelva Nay, PA-C  Multiple Vitamin (MULTIVITAMIN) tablet Take 1 tablet by mouth daily.     Yes Historical Provider, MD  sildenafil (VIAGRA) 100 MG tablet Take 1 tablet (100 mg total) by mouth as needed for erectile dysfunction. Needs office visit 06/07/12  Yes Nelva Nay, PA-C  testosterone cypionate (DEPOTESTOTERONE CYPIONATE) 200 MG/ML injection Inject into the muscle as needed.     Yes Historical Provider, MD    Allergies:  Allergies  Allergen Reactions  . Aspirin     REACTION: upsets stomach  . Statins     REACTION: UPSETS STOMACH    History   Social History  . Marital Status: Married    Spouse Name: N/A    Number of Children: 2  . Years of Education: N/A   Occupational History  . Not on file.   Social History Main Topics  . Smoking status: Never Smoker   . Smokeless tobacco: Not on file  . Alcohol Use: No  . Drug Use: No  . Sexually Active: No   Other Topics Concern  . Not on file   Social History Narrative  . No narrative on file     Review of Systems: Constitutional: negative for chills, fever, night sweats or weight changes  Cardiovascular: negative for chest pain or palpitations Respiratory: negative for hemoptysis, wheezing, or shortness of breath Abdominal: negative for abdominal pain, nausea, vomiting or diarrhea Dermatological: negative for rash Neurologic: negative for headache   Physical Exam: Blood pressure 151/91, pulse 65, temperature 97.9 F (36.6 C), temperature source Oral, resp. rate 18, height 6' 2.5" (1.892 m), weight 291 lb (131.997 kg), SpO2 98.00%., Body mass index is 36.87 kg/(m^2). General: Well developed, well nourished, in no acute distress. Head: Normocephalic, atraumatic, eyes without discharge, sclera non-icteric, nares are congested. Bilateral auditory canals clear, TM's are without perforation, pearly grey with  reflective cone of light bilaterally. No sinus TTP. Oral cavity moist, dentition normal. Posterior pharynx with post nasal drip and mild erythema. No peritonsillar abscess or tonsillar exudate. Neck: Supple. No thyromegaly. Full ROM. No lymphadenopathy. Lungs: Coarse breath sounds bilaterally without wheezes, rales, or rhonchi. Breathing is unlabored.  Heart: RRR with S1 S2. No murmurs, rubs, or gallops appreciated. Msk:  Strength and tone normal for age. Extremities: No clubbing or cyanosis. No edema. Neuro: Alert and oriented X 3. Moves all extremities spontaneously. CNII-XII grossly in tact. Psych:  Responds to questions appropriately with a normal affect.    ASSESSMENT AND PLAN:  62 y.o. year old male with bronchitis. -Acute bronchitis - Plan: azithromycin (ZITHROMAX Z-PAK) 250 MG tablet, HYDROcodone-homatropine (HYCODAN) 5-1.5 MG/5ML syrup, albuterol (PROVENTIL HFA;VENTOLIN HFA) 108 (90 BASE) MCG/ACT inhaler   -Mucinex -Tylenol/Motrin prn -Rest/fluids -RTC precautions -RTC 3-5 days if no improvement  Signed, Elvina Sidle, MD 09/19/2012 10:42 AM

## 2012-09-19 NOTE — Patient Instructions (Signed)

## 2012-09-25 ENCOUNTER — Other Ambulatory Visit: Payer: Self-pay | Admitting: Physician Assistant

## 2012-09-28 ENCOUNTER — Telehealth: Payer: Self-pay

## 2012-09-28 NOTE — Telephone Encounter (Signed)
[facility]axed [facility] confirmation to [facility] at [phone].

## 2012-09-28 NOTE — Telephone Encounter (Signed)
JANE FROM THE [facility] STATES PT IS HAVING SURGERY AND THEY ARE IN NEED OF HIS EKG HE HAD DONE LAST YEAR. PLEASE FAX TO [phone] AND THE PHONE NUMBER IS [phone] [phone]

## 2012-10-30 ENCOUNTER — Other Ambulatory Visit: Payer: Self-pay | Admitting: Internal Medicine

## 2012-11-09 ENCOUNTER — Ambulatory Visit (INDEPENDENT_AMBULATORY_CARE_PROVIDER_SITE_OTHER): Payer: Managed Care, Other (non HMO) | Admitting: Family Medicine

## 2012-11-09 VITALS — BP 130/80 | HR 78 | Temp 98.1°F | Resp 18 | Ht 73.75 in | Wt 290.0 lb

## 2012-11-09 DIAGNOSIS — Z0000 Encounter for general adult medical examination without abnormal findings: Secondary | ICD-10-CM

## 2012-11-09 DIAGNOSIS — I10 Essential (primary) hypertension: Secondary | ICD-10-CM

## 2012-11-09 DIAGNOSIS — G4700 Insomnia, unspecified: Secondary | ICD-10-CM

## 2012-11-09 DIAGNOSIS — R0683 Snoring: Secondary | ICD-10-CM

## 2012-11-09 DIAGNOSIS — E669 Obesity, unspecified: Secondary | ICD-10-CM

## 2012-11-09 DIAGNOSIS — N529 Male erectile dysfunction, unspecified: Secondary | ICD-10-CM

## 2012-11-09 DIAGNOSIS — E291 Testicular hypofunction: Secondary | ICD-10-CM

## 2012-11-09 DIAGNOSIS — M109 Gout, unspecified: Secondary | ICD-10-CM

## 2012-11-09 DIAGNOSIS — R040 Epistaxis: Secondary | ICD-10-CM

## 2012-11-09 LAB — COMPREHENSIVE METABOLIC PANEL
AST: 19 U/L (ref 0–37)
Alkaline Phosphatase: 52 U/L (ref 39–117)
BUN: 16 mg/dL (ref 6–23)
Creat: 1.15 mg/dL (ref 0.50–1.35)
Potassium: 4.1 mEq/L (ref 3.5–5.3)
Total Bilirubin: 0.4 mg/dL (ref 0.3–1.2)

## 2012-11-09 LAB — POCT CBC
Granulocyte percent: 58.3 %G (ref 37–80)
MCH, POC: 27.3 pg (ref 27–31.2)
MCHC: 31 g/dL — AB (ref 31.8–35.4)
MCV: 88.1 fL (ref 80–97)
MID (cbc): 0.3 (ref 0–0.9)
POC LYMPH PERCENT: 34.2 %L (ref 10–50)
Platelet Count, POC: 194 10*3/uL (ref 142–424)
RDW, POC: 16.4 %

## 2012-11-09 LAB — LIPID PANEL
Cholesterol: 229 mg/dL — ABNORMAL HIGH (ref 0–200)
Total CHOL/HDL Ratio: 5.7 Ratio
Triglycerides: 130 mg/dL (ref <150)
VLDL: 26 mg/dL (ref 0–40)

## 2012-11-09 MED ORDER — SILDENAFIL CITRATE 100 MG PO TABS: 100.0000 mg | ORAL_TABLET | ORAL | Status: DC | PRN

## 2012-11-09 MED ORDER — FEBUXOSTAT 40 MG PO TABS: 40.0000 mg | ORAL_TABLET | Freq: Every day | ORAL | Status: DC

## 2012-11-09 MED ORDER — ALPRAZOLAM 1 MG PO TABS: 1.0000 mg | ORAL_TABLET | Freq: Every evening | ORAL | Status: DC | PRN

## 2012-11-09 MED ORDER — POTASSIUM CITRATE ER 10 MEQ (1080 MG) PO TBCR: 10.0000 meq | EXTENDED_RELEASE_TABLET | Freq: Two times a day (BID) | ORAL | Status: DC

## 2012-11-09 MED ORDER — LISINOPRIL-HYDROCHLOROTHIAZIDE 10-12.5 MG PO TABS: ORAL_TABLET | ORAL | Status: DC

## 2012-11-09 NOTE — Progress Notes (Addendum)
[facility] [address] [place] [address] [phone]  Date:  11/09/2012   Name:  [name] [name]   DOB:  1950/11/16   MRN:  [id number]  PCP:  Tally Due, MD    Chief Complaint: Annual Exam   History of Present Illness:  [name] [name] is a 62 y.o. very pleasant male patient who presents with the following:  He is fasting this am- here for a CPE.   He sees Dr. Patsi Sears for urologic issues- he was last seen about one year ago and plans to follow-up soon.  He has had a prostatectomy, and is treated with testosterone.    He has noted some nosebleeds for the last year or so.  He stopped his aspirin, but continues to have a bleed once or twice a month. He can stop them in 15 minutes.  He always bleeds from the right side.  The bleeds can occur after blowing his nose, or without any apparent cause.    Last colonoscopy about 2 years ago, negative per his report.   He uses alprazaolam at bedtime once a week or so for insomnia.   He ran out of his uloric a few months ago and would like to restart.  He did well on 40 mg a day  States his home BP is usually 120's /80's.   He does snore at home, never been tested for OSA  Patient Active Problem List   Diagnosis Date Noted  . Cough 02/18/2012  . Hypertension   . BPH (benign prostatic hypertrophy)   . Obesity, unspecified   . Kidney stones   . Prostate nodule     Past Medical History  Diagnosis Date  . Hypertension   . BPH (benign prostatic hypertrophy)   . Obesity, unspecified   . Colon polyp     diverticular bleed with transfusion  . Kidney stones     15-20  . Prostate nodule [id number]  . Hyperlipidemia   . Anxiety   . Allergy   . Arthritis     Past Surgical History  Procedure Laterality Date  . Joint replacement    . Hernia repair    . Transurethral resection of prostate    . Ankle fusion      History  Substance Use Topics  . Smoking status: Never Smoker   . Smokeless  tobacco: Not on file  . Alcohol Use: No    Family History  Problem Relation Age of Onset  . Prostate cancer    . Diabetes    . Diabetes Mother     Allergies  Allergen Reactions  . Aspirin     REACTION: upsets stomach  . Statins     REACTION: UPSETS STOMACH    Medication list has been reviewed and updated.  Current Outpatient Prescriptions on File Prior to Visit  Medication Sig Dispense Refill  . albuterol (PROVENTIL HFA;VENTOLIN HFA) 108 (90 BASE) MCG/ACT inhaler Inhale 2 puffs into the lungs every 6 (six) hours as needed for wheezing.  1 Inhaler  1  . ALPRAZolam (XANAX) 1 MG tablet Take 1 tablet (1 mg total) by mouth at bedtime as needed for sleep. Needs OFFICE VISIT FOR MORE.  30 tablet  0  . b complex vitamins tablet Take 1 tablet by mouth daily.        . cholecalciferol (VITAMIN D) 1000 UNITS tablet Take 1,000 Units by mouth daily.        . fish oil-omega-3 fatty acids  1000 MG capsule Take 1 g by mouth daily.        [name] Kitchen lisinopril-hydrochlorothiazide (PRINZIDE,ZESTORETIC) 10-12.5 MG per tablet TAKE 1 TABLET EVERY DAY FOR BLOOD PRESSURE  30 tablet  0  . Multiple Vitamin (MULTIVITAMIN) tablet Take 1 tablet by mouth daily.        . sildenafil (VIAGRA) 100 MG tablet Take 1 tablet (100 mg total) by mouth as needed for erectile dysfunction. Needs office visit  21 tablet  0  . testosterone cypionate (DEPOTESTOTERONE CYPIONATE) 200 MG/ML injection Inject into the muscle as needed.        [name] Kitchen aspirin EC 81 MG tablet Take 81 mg by mouth daily.        [name] Kitchen azithromycin (ZITHROMAX Z-PAK) 250 MG tablet Take as directed on pack  6 tablet  0  . febuxostat (ULORIC) 40 MG tablet Take 40 mg by mouth daily.        [name] Kitchen HYDROcodone-homatropine (HYCODAN) 5-1.5 MG/5ML syrup Take 5 mLs by mouth every 8 (eight) hours as needed for cough.  120 mL  0   No current facility-administered medications on file prior to visit.    Review of Systems:  As per HPI- otherwise negative. Tdap and pneumovax are  UTD  Physical Examination: Filed Vitals:   11/09/12 0854  BP: 142/92  Pulse: 78  Temp: 98.1 F (36.7 C)  Resp: 18   Filed Vitals:   11/09/12 0854  Height: 6' 1.75" (1.873 m)  Weight: 290 lb (131.543 kg)   Body mass index is 37.5 kg/(m^2). Ideal Body Weight: Weight in (lb) to have BMI = 25: 193  GEN: WDWN, NAD, Non-toxic, A & O x 3, obesity HEENT: Atraumatic, Normocephalic. Neck supple. No masses, No LAD.  Bilateral TM wnl, oropharynx normal.  PEERL,EOMI.   Ears and Nose: No external deformity. CV: RRR, No M/G/R. No JVD. No thrill. No extra heart sounds. PULM: CTA B, no wheezes, crackles, rhonchi. No retractions. No resp. distress. No accessory muscle use. ABD: S, NT, ND, +BS. No rebound. No HSM. EXTR: No c/c/e NEURO Normal gait.  PSYCH: Normally interactive. Conversant. Not depressed or anxious appearing.  Calm demeanor.  Defer genital exam as he sees the urologist for this.    Results for orders placed in visit on 11/09/12  POCT CBC      Result Value Range   WBC 4.5 (*) 4.6 - 10.2 K/uL   Lymph, poc 1.5  0.6 - 3.4   POC LYMPH PERCENT 34.2  10 - 50 %L   MID (cbc) 0.3  0 - 0.9   POC MID % 7.5  0 - 12 %M   POC Granulocyte 2.6  2 - 6.9   Granulocyte percent 58.3  37 - 80 %G   RBC 4.79  4.69 - 6.13 M/uL   Hemoglobin 13.1 (*) 14.1 - 18.1 g/dL   HCT, POC 16.1 (*) 09.6 - 53.7 %   MCV 88.1  80 - 97 fL   MCH, POC 27.3  27 - 31.2 pg   MCHC 31.0 (*) 31.8 - 35.4 g/dL   RDW, POC 04.5     Platelet Count, POC 194  142 - 424 K/uL   MPV 7.1  0 - 99.8 fL    Assessment and Plan: Physical exam, annual - Plan: POCT CBC, Comprehensive metabolic panel, Lipid panel  Hypogonadism male - Plan: Testosterone  Snoring - Plan: Nocturnal polysomnography (NPSG)  Epistaxis - Plan: Ambulatory referral to ENT  Obesity, unspecified  HTN (hypertension) - Plan:  lisinopril-hydrochlorothiazide (PRINZIDE,ZESTORETIC) 10-12.5 MG per tablet, potassium citrate (UROCIT-K) 10 MEQ (1080 MG) SR  tablet  Erectile dysfunction - Plan: sildenafil (VIAGRA) 100 MG tablet  Insomnia - Plan: ALPRAZolam (XANAX) 1 MG tablet  Gout - Plan: febuxostat (ULORIC) 40 MG tablet  Refilled his medications today.  Referral for a sleep study, and to ENT for nosebleeds.   See patient instructions for more details.   Will plan further follow- up pending labs.   Signed Abbe Amsterdam, MD

## 2012-11-09 NOTE — Patient Instructions (Addendum)
We will get your set up to see ENT about the nosebleeds, and to have a sleep study to check you for sleep apnea.  As your knee heals, work on getting more exercise for weight loss.  Please keep an eye on your blood pressure- you can check it at work or at the drug store.  Let us know if running more than 135/85 on a regular basis  Be sure to see the dentist regularly  If you would like, you can have a shingles vaccine at the drug store-  Use the paper rx I gave you today

## 2012-11-10 ENCOUNTER — Encounter: Payer: Self-pay | Admitting: Family Medicine

## 2013-01-27 ENCOUNTER — Other Ambulatory Visit: Payer: Self-pay | Admitting: Family Medicine

## 2013-01-27 DIAGNOSIS — G4700 Insomnia, unspecified: Secondary | ICD-10-CM

## 2013-01-28 NOTE — Telephone Encounter (Signed)
Will refill #30 with 1RF

## 2013-04-15 ENCOUNTER — Other Ambulatory Visit: Payer: Self-pay

## 2013-06-15 ENCOUNTER — Ambulatory Visit (INDEPENDENT_AMBULATORY_CARE_PROVIDER_SITE_OTHER): Payer: BC Managed Care – PPO | Admitting: Physician Assistant

## 2013-06-15 VITALS — BP 130/80 | HR 87 | Temp 98.7°F | Resp 16 | Ht 73.75 in | Wt 291.4 lb

## 2013-06-15 DIAGNOSIS — J069 Acute upper respiratory infection, unspecified: Secondary | ICD-10-CM

## 2013-06-15 DIAGNOSIS — R059 Cough, unspecified: Secondary | ICD-10-CM

## 2013-06-15 DIAGNOSIS — R05 Cough: Secondary | ICD-10-CM

## 2013-06-15 MED ORDER — HYDROCODONE-HOMATROPINE 5-1.5 MG/5ML PO SYRP: 5.0000 mL | ORAL_SOLUTION | Freq: Three times a day (TID) | ORAL | Status: DC | PRN

## 2013-06-15 MED ORDER — BENZONATATE 100 MG PO CAPS: 100.0000 mg | ORAL_CAPSULE | Freq: Three times a day (TID) | ORAL | Status: DC | PRN

## 2013-06-15 NOTE — Patient Instructions (Signed)
Begin using the benzonatate (Tessalon Perles) every 8 hours as needed for cough.  Use the Hycodan syrup at bed time - this will make you sleepy!  Take Mucinex twice daily.  Drink plenty of fluids - water is best!  If any of your symptoms are worsening or not improving, please let us know   Upper Respiratory Infection, Adult An upper respiratory infection (URI) is also sometimes known as the common cold. The upper respiratory tract includes the nose, sinuses, throat, trachea, and bronchi. Bronchi are the airways leading to the lungs. Most people improve within 1 week, but symptoms can last up to 2 weeks. A residual cough may last even longer.  CAUSES Many different viruses can infect the tissues lining the upper respiratory tract. The tissues become irritated and inflamed and often become very moist. Mucus production is also common. A cold is contagious. You can easily spread the virus to others by oral contact. This includes kissing, sharing a glass, coughing, or sneezing. Touching your mouth or nose and then touching a surface, which is then touched by another person, can also spread the virus. SYMPTOMS  Symptoms typically develop 1 to 3 days after you come in contact with a cold virus. Symptoms vary from person to person. They may include:  Runny nose.  Sneezing.  Nasal congestion.  Sinus irritation.  Sore throat.  Loss of voice (laryngitis).  Cough.  Fatigue.  Muscle aches.  Loss of appetite.  Headache.  Low-grade fever. DIAGNOSIS  You might diagnose your own cold based on familiar symptoms, since most people get a cold 2 to 3 times a year. Your caregiver can confirm this based on your exam. Most importantly, your caregiver can check that your symptoms are not due to another disease such as strep throat, sinusitis, pneumonia, asthma, or epiglottitis. Blood tests, throat tests, and X-rays are not necessary to diagnose a common cold, but they may sometimes be helpful in excluding  other more serious diseases. Your caregiver will decide if any further tests are required. RISKS AND COMPLICATIONS  You may be at risk for a more severe case of the common cold if you smoke cigarettes, have chronic heart disease (such as heart failure) or lung disease (such as asthma), or if you have a weakened immune system. The very young and very old are also at risk for more serious infections. Bacterial sinusitis, middle ear infections, and bacterial pneumonia can complicate the common cold. The common cold can worsen asthma and chronic obstructive pulmonary disease (COPD). Sometimes, these complications can require emergency medical care and may be life-threatening. PREVENTION  The best way to protect against getting a cold is to practice good hygiene. Avoid oral or hand contact with people with cold symptoms. Wash your hands often if contact occurs. There is no clear evidence that vitamin C, vitamin E, echinacea, or exercise reduces the chance of developing a cold. However, it is always recommended to get plenty of rest and practice good nutrition. TREATMENT  Treatment is directed at relieving symptoms. There is no cure. Antibiotics are not effective, because the infection is caused by a virus, not by bacteria. Treatment may include:  Increased fluid intake. Sports drinks offer valuable electrolytes, sugars, and fluids.  Breathing heated mist or steam (vaporizer or shower).  Eating chicken soup or other clear broths, and maintaining good nutrition.  Getting plenty of rest.  Using gargles or lozenges for comfort.  Controlling fevers with ibuprofen or acetaminophen as directed by your caregiver.  Increasing usage  of your inhaler if you have asthma. Zinc gel and zinc lozenges, taken in the first 24 hours of the common cold, can shorten the duration and lessen the severity of symptoms. Pain medicines may help with fever, muscle aches, and throat pain. A variety of non-prescription medicines  are available to treat congestion and runny nose. Your caregiver can make recommendations and may suggest nasal or lung inhalers for other symptoms.  HOME CARE INSTRUCTIONS   Only take over-the-counter or prescription medicines for pain, discomfort, or fever as directed by your caregiver.  Use a warm mist humidifier or inhale steam from a shower to increase air moisture. This may keep secretions moist and make it easier to breathe.  Drink enough water and fluids to keep your urine clear or pale yellow.  Rest as needed.  Return to work when your temperature has returned to normal or as your caregiver advises. You may need to stay home longer to avoid infecting others. You can also use a face mask and careful hand washing to prevent spread of the virus. SEEK MEDICAL CARE IF:   After the first few days, you feel you are getting worse rather than better.  You need your caregiver's advice about medicines to control symptoms.  You develop chills, worsening shortness of breath, or brown or red sputum. These may be signs of pneumonia.  You develop yellow or brown nasal discharge or pain in the face, especially when you bend forward. These may be signs of sinusitis.  You develop a fever, swollen neck glands, pain with swallowing, or white areas in the back of your throat. These may be signs of strep throat. SEEK IMMEDIATE MEDICAL CARE IF:   You have a fever.  You develop severe or persistent headache, ear pain, sinus pain, or chest pain.  You develop wheezing, a prolonged cough, cough up blood, or have a change in your usual mucus (if you have chronic lung disease).  You develop sore muscles or a stiff neck. Document Released: 11/20/2000 Document Revised: 08/19/2011 Document Reviewed: 09/28/2010 [employer] Patient Information 2014 [place], [place].

## 2013-06-15 NOTE — Progress Notes (Signed)
   Subjective:    Patient ID: [name] [name], male    DOB: Jun 30, 1950, 63 y.o.   MRN: [id number]  HPI   [name] [name] is a very pleasant 63 yr old male here with concern for illness.  Reports symptoms began about 5 days ago.  Complains of dry, non-productive cough.  Coughed all night last night.  Chest is sore from coughing so much - across anterior chest, no radiation of pain, not worsened with exertion, only present with cough.  Has had a light headache for a couple days as well.  Denies nasal congestion.  Subjective fever 2 days ago but didn't take temp.  Denies body aches, muscle aches, SOB, wheezing.  Hx bronchitis but no asthma hx.  Non smoker.  Tried otc Delsym without relief.   Review of Systems  Constitutional: Positive for fever (subjective). Negative for chills.  HENT: Negative for congestion, ear pain, rhinorrhea, sinus pressure and sore throat.   Respiratory: Positive for cough. Negative for shortness of breath and wheezing.   Cardiovascular: Positive for chest pain (with cough only).  Gastrointestinal: Negative.   Musculoskeletal: Negative for arthralgias and myalgias.  Skin: Negative.   Neurological: Positive for headaches.       Objective:   Physical Exam  Vitals reviewed. Constitutional: He is oriented to person, place, and time. He appears well-developed and well-nourished. No distress.  HENT:  Head: Normocephalic and atraumatic.  Mouth/Throat: Uvula is midline and oropharynx is clear and moist.  Bilateral canals occluded with cerumen  Eyes: Conjunctivae are normal. No scleral icterus.  Neck: Neck supple.  Cardiovascular: Normal rate, regular rhythm and normal heart sounds.   Pulmonary/Chest: Effort normal and breath sounds normal. He has no wheezes. He has no rales.  Lymphadenopathy:    He has no cervical adenopathy.  Neurological: He is alert and oriented to person, place, and time.  Skin: Skin is warm and dry.  Psychiatric: He has a normal mood and affect. His  behavior is normal.        Assessment & Plan:  Cough - Plan: benzonatate (TESSALON) 100 MG capsule, HYDROcodone-homatropine (HYCODAN) 5-1.5 MG/5ML syrup   [name] [name] is a very pleasant 63 yr old male here with 5 days of cough.  Afebrile, lungs CTA.  Will treat with Tessalon, Hycodan.  Can also add Mucinex.  Push fluids, rest.  Work note provided.  Discussed RTC precautions including fever, worsening cough, SOB, etc  E. Natividad Brood MHS, PA-C [facility] 1/6/20158:34 AM

## 2013-06-18 ENCOUNTER — Telehealth: Payer: Self-pay

## 2013-06-18 ENCOUNTER — Encounter: Payer: Self-pay | Admitting: *Deleted

## 2013-06-18 DIAGNOSIS — R05 Cough: Secondary | ICD-10-CM

## 2013-06-18 DIAGNOSIS — R059 Cough, unspecified: Secondary | ICD-10-CM

## 2013-06-18 MED ORDER — HYDROCODONE-HOMATROPINE 5-1.5 MG/5ML PO SYRP: 5.0000 mL | ORAL_SOLUTION | Freq: Three times a day (TID) | ORAL | Status: DC | PRN

## 2013-06-18 NOTE — Telephone Encounter (Signed)
Rx printed for pt to pick up.  If worsening or not improving pt needs to RTC

## 2013-06-18 NOTE — Telephone Encounter (Signed)
Pt would like a refill on hydrocodone.  Best# [phone]  [facility]

## 2013-06-18 NOTE — Telephone Encounter (Signed)
Notified, pt agreed.

## 2013-07-07 ENCOUNTER — Ambulatory Visit (INDEPENDENT_AMBULATORY_CARE_PROVIDER_SITE_OTHER): Payer: BC Managed Care – PPO | Admitting: Emergency Medicine

## 2013-07-07 VITALS — BP 152/96 | HR 75 | Temp 98.5°F | Resp 16 | Ht 73.25 in | Wt 292.0 lb

## 2013-07-07 NOTE — Progress Notes (Signed)
[facility] [address], [place] [address] [phone]  Date:  07/07/2013   Name:  Baldomero Mirarchi   DOB:  1951/05/22   MRN:  [id number]  PCP:  Kennon Portela, MD    Chief Complaint: Headache and Nausea   History of Present Illness:  Myron Stankovich is a 63 y.o. very pleasant male patient who presents with the following:  Awoke with "the worst headache of life" this morning.  No relief with OTC medications. Has some photophobia and denies other neuro or visual symptoms.  History of migraines 25 years ago.  Says headache is frontal.  No nausea or vomiting.  No history of injury or antecedent illness.  No nuchal rigidity.  No improvement with over the counter medications or other home remedies. Denies other complaint or health concern today.   Patient Active Problem List   Diagnosis Date Noted  . Cough 02/18/2012  . Hypertension   . BPH (benign prostatic hypertrophy)   . Obesity, unspecified   . Kidney stones   . Prostate nodule     Past Medical History  Diagnosis Date  . Hypertension   . BPH (benign prostatic hypertrophy)   . Obesity, unspecified   . Colon polyp     diverticular bleed with transfusion  . Kidney stones     15-20  . Prostate nodule [id number]  . Hyperlipidemia   . Anxiety   . Allergy   . Arthritis     Past Surgical History  Procedure Laterality Date  . Joint replacement    . Hernia repair    . Transurethral resection of prostate    . Ankle fusion      History  Substance Use Topics  . Smoking status: Never Smoker   . Smokeless tobacco: Not on file  . Alcohol Use: No    Family History  Problem Relation Age of Onset  . Prostate cancer    . Diabetes    . Diabetes Mother     Allergies  Allergen Reactions  . Aspirin     REACTION: upsets stomach  . Statins     REACTION: UPSETS STOMACH    Medication list has been reviewed and updated.  Current Outpatient Prescriptions on File Prior to Visit  Medication Sig  Dispense Refill  . ALPRAZolam (XANAX) 1 MG tablet TAKE 1 TABLET BY MOUTH AT BEDTIME AS NEEDED FOR SLEEP  30 tablet  1  . b complex vitamins tablet Take 1 tablet by mouth daily.        . benzonatate (TESSALON) 100 MG capsule Take 1-2 capsules (100-200 mg total) by mouth 3 (three) times daily as needed for cough.  40 capsule  0  . cholecalciferol (VITAMIN D) 1000 UNITS tablet Take 1,000 Units by mouth daily.        . febuxostat (ULORIC) 40 MG tablet Take 1 tablet (40 mg total) by mouth daily.  30 tablet  11  . fish oil-omega-3 fatty acids 1000 MG capsule Take 1 g by mouth daily.        [name] Kitchen HYDROcodone-homatropine (HYCODAN) 5-1.5 MG/5ML syrup Take 5 mLs by mouth every 8 (eight) hours as needed for cough.  30 mL  0  . lisinopril-hydrochlorothiazide (PRINZIDE,ZESTORETIC) 10-12.5 MG per tablet TAKE 1 TABLET EVERY DAY FOR BLOOD PRESSURE  90 tablet  3  . Multiple Vitamin (MULTIVITAMIN) tablet Take 1 tablet by mouth daily.        . potassium citrate (UROCIT-K) 10 MEQ (1080 MG) SR  tablet Take 1 tablet (10 mEq total) by mouth 2 (two) times daily.  180 tablet  3  . sildenafil (VIAGRA) 100 MG tablet Take 1 tablet (100 mg total) by mouth as needed for erectile dysfunction.  6 tablet  11  . testosterone cypionate (DEPOTESTOTERONE CYPIONATE) 200 MG/ML injection Inject into the muscle as needed.         No current facility-administered medications on file prior to visit.    Review of Systems:  As per HPI, otherwise negative.    Physical Examination: Filed Vitals:   07/07/13 1950  BP: 152/96  Pulse: 75  Temp: 98.5 F (36.9 C)  Resp: 16   Filed Vitals:   07/07/13 1950  Height: 6' 1.25" (1.861 m)  Weight: 292 lb (132.45 kg)   Body mass index is 38.24 kg/(m^2). Ideal Body Weight: Weight in (lb) to have BMI = 25: 190.4  GEN: WDWN, moderate distress, Non-toxic, A & O x 3 HEENT: Atraumatic, Normocephalic. Neck supple. No masses, No LAD. Ears and Nose: No external deformity. CV: RRR, No M/G/R. No JVD.  No thrill. No extra heart sounds. PULM: CTA B, no wheezes, crackles, rhonchi. No retractions. No resp. distress. No accessory muscle use. ABD: S, NT, ND, +BS. No rebound. No HSM. EXTR: No c/c/e NEURO Normal gait.  PSYCH: Normally interactive. Conversant. Not depressed or anxious appearing.  Calm demeanor.    Assessment and Plan: Headache Referred to ER for evaluation and treatment There by POV.  Patient and wife in agreement.   Signed,  Ellison Carwin, MD

## 2013-08-16 ENCOUNTER — Encounter: Payer: Self-pay | Admitting: Internal Medicine

## 2013-08-16 ENCOUNTER — Ambulatory Visit (INDEPENDENT_AMBULATORY_CARE_PROVIDER_SITE_OTHER): Payer: BC Managed Care – PPO | Admitting: Internal Medicine

## 2013-08-16 VITALS — BP 136/84 | HR 64 | Temp 98.3°F | Resp 16 | Ht 73.5 in | Wt 294.0 lb

## 2013-08-16 DIAGNOSIS — R040 Epistaxis: Secondary | ICD-10-CM

## 2013-08-16 DIAGNOSIS — Z7189 Other specified counseling: Secondary | ICD-10-CM

## 2013-08-16 DIAGNOSIS — I10 Essential (primary) hypertension: Secondary | ICD-10-CM

## 2013-08-16 LAB — COMPREHENSIVE METABOLIC PANEL
ALBUMIN: 4.2 g/dL (ref 3.5–5.2)
ALT: 27 U/L (ref 0–53)
AST: 20 U/L (ref 0–37)
Alkaline Phosphatase: 55 U/L (ref 39–117)
BUN: 16 mg/dL (ref 6–23)
CO2: 27 mEq/L (ref 19–32)
Calcium: 9.9 mg/dL (ref 8.4–10.5)
Chloride: 107 mEq/L (ref 96–112)
Creat: 1.2 mg/dL (ref 0.50–1.35)
GLUCOSE: 103 mg/dL — AB (ref 70–99)
POTASSIUM: 4.1 meq/L (ref 3.5–5.3)
SODIUM: 143 meq/L (ref 135–145)
TOTAL PROTEIN: 6.9 g/dL (ref 6.0–8.3)
Total Bilirubin: 0.3 mg/dL (ref 0.2–1.2)

## 2013-08-16 LAB — POCT CBC
GRANULOCYTE PERCENT: 57.7 % (ref 37–80)
HEMATOCRIT: 40.3 % — AB (ref 43.5–53.7)
Hemoglobin: 12.7 g/dL — AB (ref 14.1–18.1)
Lymph, poc: 2 (ref 0.6–3.4)
MCH, POC: 27.4 pg (ref 27–31.2)
MCHC: 31.5 g/dL — AB (ref 31.8–35.4)
MCV: 86.8 fL (ref 80–97)
MID (cbc): 0.3 (ref 0–0.9)
MPV: 6.7 fL (ref 0–99.8)
PLATELET COUNT, POC: 218 10*3/uL (ref 142–424)
POC GRANULOCYTE: 3.1 (ref 2–6.9)
POC LYMPH %: 36.8 % (ref 10–50)
POC MID %: 5.5 %M (ref 0–12)
RBC: 4.64 M/uL — AB (ref 4.69–6.13)
RDW, POC: 16.4 %
WBC: 5.3 10*3/uL (ref 4.6–10.2)

## 2013-08-16 LAB — PSA: PSA: 6.57 ng/mL — ABNORMAL HIGH (ref <=4.00)

## 2013-08-16 LAB — TSH: TSH: 1.456 u[IU]/mL (ref 0.350–4.500)

## 2013-08-16 NOTE — Progress Notes (Signed)
   Subjective:    Patient ID: [name] [name], male    DOB: 1950-06-21, 63 y.o.   MRN: [id number]  HPI Nose bleeds off and on 6 weeks   Review of Systems     Objective:   Physical Exam  Vitals reviewed. Constitutional: He is oriented to person, place, and time. He appears well-developed and well-nourished. No distress.  HENT:  Head: Normocephalic.  Right Ear: External ear normal.  Left Ear: External ear normal.  Nose: Mucosal edema, rhinorrhea and sinus tenderness present. Epistaxis is observed.  Eyes: EOM are normal. No scleral icterus.  Neck: Normal range of motion. Neck supple.  Cardiovascular: Normal rate.   Pulmonary/Chest: Effort normal and breath sounds normal.  Neurological: He is alert and oriented to person, place, and time. He exhibits normal muscle tone. Coordination normal.  Psychiatric: He has a normal mood and affect. His behavior is normal.     Results for orders placed in visit on 08/16/13  POCT CBC      Result Value Ref Range   WBC 5.3  4.6 - 10.2 K/uL   Lymph, poc 2.0  0.6 - 3.4   POC LYMPH PERCENT 36.8  10 - 50 %L   MID (cbc) 0.3  0 - 0.9   POC MID % 5.5  0 - 12 %M   POC Granulocyte 3.1  2 - 6.9   Granulocyte percent 57.7  37 - 80 %G   RBC 4.64 (*) 4.69 - 6.13 M/uL   Hemoglobin 12.7 (*) 14.1 - 18.1 g/dL   HCT, POC 40.3 (*) 43.5 - 53.7 %   MCV 86.8  80 - 97 fL   MCH, POC 27.4  27 - 31.2 pg   MCHC 31.5 (*) 31.8 - 35.4 g/dL   RDW, POC 16.4     Platelet Count, POC 218  142 - 424 K/uL   MPV 6.7  0 - 99.8 fL   Cautery right anterior septum pyogenic granuloma and bleed. Preped with viscus lidocain     Assessment & Plan:  Anterior Epistaxis Nose bleed care

## 2013-08-16 NOTE — Patient Instructions (Signed)

## 2013-08-18 ENCOUNTER — Encounter: Payer: Self-pay | Admitting: Radiology

## 2013-10-05 ENCOUNTER — Other Ambulatory Visit: Payer: Self-pay | Admitting: Family Medicine

## 2013-10-05 DIAGNOSIS — G4700 Insomnia, unspecified: Secondary | ICD-10-CM

## 2013-10-06 NOTE — Telephone Encounter (Signed)
Dr Lorelei Pont, it looks like you have most recently been managing this med for pt, but you haven't seen the pt since 11/2012. Do you need to see pt for RFs, or do we need to send to Dr Elder Cyphers to review?

## 2013-10-18 ENCOUNTER — Ambulatory Visit (INDEPENDENT_AMBULATORY_CARE_PROVIDER_SITE_OTHER): Payer: BC Managed Care – PPO | Admitting: Internal Medicine

## 2013-10-18 VITALS — BP 122/84 | HR 73 | Temp 98.2°F | Resp 18 | Ht 73.0 in | Wt 294.0 lb

## 2013-10-18 DIAGNOSIS — R040 Epistaxis: Secondary | ICD-10-CM

## 2013-10-18 DIAGNOSIS — R972 Elevated prostate specific antigen [PSA]: Secondary | ICD-10-CM

## 2013-10-18 NOTE — Progress Notes (Signed)
   Subjective:    Patient ID: [name] [name], male    DOB: November 12, 1950, 63 y.o.   MRN: [id number]  HPI Right nasal bleed increasing in frequency, reviewed note from ENT. Silver nitrate cautery done and endocopy done. Not on blood thinners. Also PSA up to 6.47, take this to urology.   Review of Systems     Objective:   Physical Exam  Constitutional: He is oriented to person, place, and time. He appears well-developed and well-nourished.  HENT:  Head: Normocephalic.  Right Ear: External ear normal.  Left Ear: External ear normal.  Nose: Mucosal edema, rhinorrhea and sinus tenderness present. Epistaxis is observed.    Mouth/Throat: Oropharynx is clear and moist.  Granuloma in right nasal passage at inferior turbinate.  Pulmonary/Chest: Effort normal.  Musculoskeletal: Normal range of motion. He exhibits tenderness.  Neurological: He is alert and oriented to person, place, and time. He exhibits normal muscle tone. Coordination normal.  Psychiatric: He has a normal mood and affect.    Viscus lidocaine topical Silver nitrate cautery with success      Assessment & Plan:  Anterior nosebleed

## 2013-10-18 NOTE — Patient Instructions (Signed)

## 2013-11-12 ENCOUNTER — Ambulatory Visit: Payer: BC Managed Care – PPO

## 2013-11-12 ENCOUNTER — Ambulatory Visit (INDEPENDENT_AMBULATORY_CARE_PROVIDER_SITE_OTHER): Payer: BC Managed Care – PPO | Admitting: Family Medicine

## 2013-11-12 VITALS — BP 120/76 | HR 86 | Temp 97.8°F | Resp 16 | Ht 73.0 in | Wt 294.5 lb

## 2013-11-12 DIAGNOSIS — R05 Cough: Secondary | ICD-10-CM

## 2013-11-12 DIAGNOSIS — R042 Hemoptysis: Secondary | ICD-10-CM

## 2013-11-12 DIAGNOSIS — H9190 Unspecified hearing loss, unspecified ear: Secondary | ICD-10-CM

## 2013-11-12 DIAGNOSIS — R059 Cough, unspecified: Secondary | ICD-10-CM

## 2013-11-12 DIAGNOSIS — J40 Bronchitis, not specified as acute or chronic: Secondary | ICD-10-CM

## 2013-11-12 DIAGNOSIS — J209 Acute bronchitis, unspecified: Secondary | ICD-10-CM

## 2013-11-12 MED ORDER — HYDROCODONE-HOMATROPINE 5-1.5 MG/5ML PO SYRP: 5.0000 mL | ORAL_SOLUTION | Freq: Three times a day (TID) | ORAL | Status: DC | PRN

## 2013-11-12 MED ORDER — AZITHROMYCIN 250 MG PO TABS: ORAL_TABLET | ORAL | Status: DC

## 2013-11-12 MED ORDER — PREDNISONE 20 MG PO TABS: ORAL_TABLET | ORAL | Status: DC

## 2013-11-12 NOTE — Patient Instructions (Signed)

## 2013-11-12 NOTE — Progress Notes (Signed)
° °  Subjective:    Patient ID: [name] [name], male    DOB: Oct 18, 1950, 63 y.o.   MRN: [id number] This chart was scribed for [name] Haber, MD by Terressa Koyanagi, ED Scribe at [facility]. This patient was seen in room Room 11 and the patient's care was started at 9:04 AM.  HPI HPI Comments: [name] [name] is a 63 y.o. male, with a history of HTN, BPH. Kidney stones and prostate nodule, who presents to the [facility] complaining of bronchitis.     Review of Systems     Objective:   Physical Exam  CONSTITUTIONAL: Well developed/well nourished HEAD: Normocephalic/atraumatic EYES: EOMI/PERRL ENMT: Mucous membranes moist NECK: supple no meningeal signs SPINE:entire spine nontender CV: S1/S2 noted, no murmurs/rubs/gallops noted LUNGS: Lungs are clear to auscultation bilaterally, no apparent distress ABDOMEN: soft, nontender, no rebound or guarding GU:no cva tenderness NEURO: Pt is awake/alert, moves all extremitiesx4 EXTREMITIES: pulses normal, full ROM SKIN: warm, color normal PSYCH: no abnormalities of mood noted       Assessment & Plan:  DIAGNOSTIC STUDIES: Oxygen Saturation is 99% on RA, normal by my interpretation.   Cough - Plan: DG Chest 2 View, HYDROcodone-homatropine (HYCODAN) 5-1.5 MG/5ML syrup  Hemoptysis - Plan: DG Chest 2 View  Bronchitis - Plan: azithromycin (ZITHROMAX Z-PAK) 250 MG tablet, predniSONE (DELTASONE) 20 MG tablet, HYDROcodone-homatropine (HYCODAN) 5-1.5 MG/5ML syrup  Hearing loss  Signed, [name] Haber, MD

## 2013-11-12 NOTE — Progress Notes (Signed)
Patient ID: [name] [name] MRN: [id number], DOB: October 02, 1950, 63 y.o. Date of Encounter: 11/12/2013, 8:21 AM  Primary Physician: Kennon Portela, MD  Chief Complaint:  Chief Complaint  Patient presents with  . Bronchitis    cough x 1 day    HPI: 63 y.o. year old male presents with a 2 day history of nasal congestion, post nasal drip, sore throat, and cough. Mild sinus pressure. Afebrile. No chills. Nasal congestion thick and green/yellow. Cough is productive of green/yellow sputum and not associated with time of day. Ears feel full, leading to sensation of muffled hearing. Has tried OTC cold preps without success. No GI complaints.   No sick contacts, recent antibiotics, or recent travels.   No leg trauma, sedentary periods, h/o cancer, or tobacco use.  Patient has had bronchitis in the past. She denies fever or asthma. Nevertheless he has had wheezing in the past and he has wheezing now.  Patient works as an Engineer, manufacturing systems  Past Medical History  Diagnosis Date  . Hypertension   . BPH (benign prostatic hypertrophy)   . Obesity, unspecified   . Colon polyp     diverticular bleed with transfusion  . Kidney stones     15-20  . Prostate nodule [id number]  . Hyperlipidemia   . Anxiety   . Allergy   . Arthritis      Home Meds: Prior to Admission medications   Medication Sig Start Date End Date Taking? Authorizing Provider  ALPRAZolam (XANAX) 1 MG tablet TAKE 1 TABLET BY MOUTH AT BEDTIME AS NEEDED FOR SLEEP   Yes Gay Filler Copland, MD  b complex vitamins tablet Take 1 tablet by mouth daily.     Yes Historical Provider, MD  benzonatate (TESSALON) 100 MG capsule Take 1-2 capsules (100-200 mg total) by mouth 3 (three) times daily as needed for cough. 06/15/13  Yes Theda Sers, PA-C  cholecalciferol (VITAMIN D) 1000 UNITS tablet Take 1,000 Units by mouth daily.     Yes Historical Provider, MD  febuxostat (ULORIC) 40 MG tablet Take 1 tablet (40 mg total) by mouth daily.  11/09/12  Yes Gay Filler Copland, MD  fish oil-omega-3 fatty acids 1000 MG capsule Take 1 g by mouth daily.     Yes Historical Provider, MD  HYDROcodone-homatropine (HYCODAN) 5-1.5 MG/5ML syrup Take 5 mLs by mouth every 8 (eight) hours as needed for cough. 06/18/13  Yes Eleanore Kurtis Bushman, PA-C  lisinopril-hydrochlorothiazide (PRINZIDE,ZESTORETIC) 10-12.5 MG per tablet TAKE 1 TABLET EVERY DAY FOR BLOOD PRESSURE 11/09/12  Yes Gay Filler Copland, MD  Multiple Vitamin (MULTIVITAMIN) tablet Take 1 tablet by mouth daily.     Yes Historical Provider, MD  potassium citrate (UROCIT-K) 10 MEQ (1080 MG) SR tablet Take 1 tablet (10 mEq total) by mouth 2 (two) times daily. 11/09/12  Yes Gay Filler Copland, MD  sildenafil (VIAGRA) 100 MG tablet Take 1 tablet (100 mg total) by mouth as needed for erectile dysfunction. 11/09/12  Yes Gay Filler Copland, MD  testosterone cypionate (DEPOTESTOTERONE CYPIONATE) 200 MG/ML injection Inject into the muscle as needed.     Yes Historical Provider, MD    Allergies:  Allergies  Allergen Reactions  . Aspirin     REACTION: upsets stomach  . Statins     REACTION: UPSETS STOMACH    History   Social History  . Marital Status: Married    Spouse Name: N/A    Number of Children: 2  . Years of Education: N/A   Occupational  History  . Not on file.   Social History Main Topics  . Smoking status: Never Smoker   . Smokeless tobacco: Not on file  . Alcohol Use: No  . Drug Use: No  . Sexual Activity: No   Other Topics Concern  . Not on file   Social History Narrative  . No narrative on file     Review of Systems: Constitutional: negative for chills, fever, night sweats or weight changes Cardiovascular: negative for chest pain or palpitations Respiratory: Positive for hemoptysis, wheezing, but no  shortness of breath Abdominal: negative for abdominal pain, nausea, vomiting or diarrhea Dermatological: negative for rash Neurologic: negative for headache   Physical  Exam: Blood pressure 120/76, pulse 86, temperature 97.8 F (36.6 C), temperature source Oral, resp. rate 16, height 6\' 1"  (1.854 m), weight 294 lb 8 oz (133.584 kg), SpO2 99.00%., Body mass index is 38.86 kg/(m^2). General: Well developed, well nourished, in no acute distress. Head: Normocephalic, atraumatic, eyes without discharge, sclera non-icteric, nares are congested. Bilateral auditory canals clear, TM's are without perforation, pearly grey with reflective cone of light bilaterally. No sinus TTP. Oral cavity moist, dentition normal. Posterior pharynx with post nasal drip and mild erythema. No peritonsillar abscess or tonsillar exudate. Neck: Supple. No thyromegaly. Full ROM. No lymphadenopathy. Lungs: Coarse breath sounds bilaterally without wheezes, rales, or rhonchi. Breathing is unlabored.  Heart: RRR with S1 S2. No murmurs, rubs, or gallops appreciated. Msk:  Strength and tone normal for age. Extremities: No clubbing or cyanosis. No edema. Neuro: Alert and oriented X 3. Moves all extremities spontaneously. CNII-XII grossly in tact. Psych:  Responds to questions appropriately with a normal affect.   [facility] reading (PRIMARY) by  Dr. Joseph Art: Chest x-ray shows.streaky RLL density     ASSESSMENT AND PLAN:  63 y.o. year old male with bronchitis. Cough - Plan: DG Chest 2 View, HYDROcodone-homatropine (HYCODAN) 5-1.5 MG/5ML syrup  Hemoptysis - Plan: DG Chest 2 View  Bronchitis - Plan: azithromycin (ZITHROMAX Z-PAK) 250 MG tablet, predniSONE (DELTASONE) 20 MG tablet, HYDROcodone-homatropine (HYCODAN) 5-1.5 MG/5ML syrup   - -Mucinex -Tylenol/Motrin prn -Rest/fluids -RTC precautions -RTC 3-5 days if no improvement  Signed, [name], MD 11/12/2013 8:21 AM

## 2013-12-06 ENCOUNTER — Ambulatory Visit
Admission: RE | Admit: 2013-12-06 | Discharge: 2013-12-06 | Disposition: A | Discharge status: Home / Self Care | Payer: BC Managed Care – PPO | Source: Ambulatory Visit | Attending: Emergency Medicine | Admitting: Emergency Medicine

## 2013-12-06 ENCOUNTER — Telehealth: Payer: Self-pay | Admitting: Emergency Medicine

## 2013-12-06 ENCOUNTER — Telehealth: Payer: Self-pay | Admitting: Family Medicine

## 2013-12-06 ENCOUNTER — Ambulatory Visit (INDEPENDENT_AMBULATORY_CARE_PROVIDER_SITE_OTHER): Payer: BC Managed Care – PPO | Admitting: Emergency Medicine

## 2013-12-06 VITALS — BP 124/86 | HR 102 | Temp 99.9°F | Resp 16 | Ht 73.0 in | Wt 289.8 lb

## 2013-12-06 DIAGNOSIS — K5792 Diverticulitis of intestine, part unspecified, without perforation or abscess without bleeding: Secondary | ICD-10-CM

## 2013-12-06 DIAGNOSIS — R109 Unspecified abdominal pain: Secondary | ICD-10-CM

## 2013-12-06 DIAGNOSIS — K5732 Diverticulitis of large intestine without perforation or abscess without bleeding: Secondary | ICD-10-CM

## 2013-12-06 LAB — POCT URINALYSIS DIPSTICK
Bilirubin, UA: NEGATIVE
Glucose, UA: NEGATIVE
Ketones, UA: NEGATIVE
LEUKOCYTES UA: NEGATIVE
NITRITE UA: NEGATIVE
PH UA: 5
PROTEIN UA: NEGATIVE
Spec Grav, UA: 1.01
Urobilinogen, UA: 0.2

## 2013-12-06 LAB — COMPREHENSIVE METABOLIC PANEL
ALT: 29 U/L (ref 0–53)
AST: 24 U/L (ref 0–37)
Albumin: 4.4 g/dL (ref 3.5–5.2)
Alkaline Phosphatase: 56 U/L (ref 39–117)
BUN: 17 mg/dL (ref 6–23)
CALCIUM: 9.4 mg/dL (ref 8.4–10.5)
CHLORIDE: 102 meq/L (ref 96–112)
CO2: 27 mEq/L (ref 19–32)
CREATININE: 1.13 mg/dL (ref 0.50–1.35)
GLUCOSE: 96 mg/dL (ref 70–99)
Potassium: 3.6 mEq/L (ref 3.5–5.3)
Sodium: 139 mEq/L (ref 135–145)
Total Bilirubin: 0.5 mg/dL (ref 0.2–1.2)
Total Protein: 7.2 g/dL (ref 6.0–8.3)

## 2013-12-06 LAB — CBC WITH DIFFERENTIAL/PLATELET
Basophils Absolute: 0 10*3/uL (ref 0.0–0.1)
Basophils Relative: 0 % (ref 0–1)
EOS ABS: 0.1 10*3/uL (ref 0.0–0.7)
Eosinophils Relative: 1 % (ref 0–5)
HEMATOCRIT: 38.9 % — AB (ref 39.0–52.0)
HEMOGLOBIN: 13.5 g/dL (ref 13.0–17.0)
LYMPHS ABS: 1.8 10*3/uL (ref 0.7–4.0)
Lymphocytes Relative: 18 % (ref 12–46)
MCH: 27.6 pg (ref 26.0–34.0)
MCHC: 34.7 g/dL (ref 30.0–36.0)
MCV: 79.4 fL (ref 78.0–100.0)
MONO ABS: 0.7 10*3/uL (ref 0.1–1.0)
Monocytes Relative: 7 % (ref 3–12)
Neutro Abs: 7.5 10*3/uL (ref 1.7–7.7)
Neutrophils Relative %: 74 % (ref 43–77)
PLATELETS: 191 10*3/uL (ref 150–400)
RBC: 4.9 MIL/uL (ref 4.22–5.81)
RDW: 15.8 % — ABNORMAL HIGH (ref 11.5–15.5)
WBC: 10.1 10*3/uL (ref 4.0–10.5)

## 2013-12-06 LAB — POCT UA - MICROSCOPIC ONLY
Bacteria, U Microscopic: NEGATIVE
CASTS, UR, LPF, POC: NEGATIVE
Crystals, Ur, HPF, POC: NEGATIVE
Mucus, UA: NEGATIVE
RBC, urine, microscopic: NEGATIVE
YEAST UA: NEGATIVE

## 2013-12-06 MED ORDER — METRONIDAZOLE 500 MG PO TABS: 500.0000 mg | ORAL_TABLET | Freq: Four times a day (QID) | ORAL | Status: DC

## 2013-12-06 MED ORDER — CIPROFLOXACIN HCL 500 MG PO TABS: 500.0000 mg | ORAL_TABLET | Freq: Two times a day (BID) | ORAL | Status: DC

## 2013-12-06 MED ORDER — IOHEXOL 300 MG/ML  SOLN: 125.0000 mL | Freq: Once | INTRAMUSCULAR | Status: AC | PRN

## 2013-12-06 NOTE — Progress Notes (Signed)
[facility] [address], [place] [address] [phone]  Date:  12/06/2013   Name:  Muhammadali Ries   DOB:  08-25-1950   MRN:  [id number]  PCP:  Kennon Portela, MD    Chief Complaint: Abdominal Pain   History of Present Illness:  Jose Corvin is a 63 y.o. very pleasant male patient who presents with the following:  Sitting on the couch yesterday and developed lower abdominal pain.  Worsened since.  Poor appetite, feels bad overall.  No fever or chills, nausea or vomiting.  No blood in stools or black stools.  Ventral hernia repair.  Colonoscopy 3 years ago.  No dysuria, urgency or frequency.  No fever or chills.  No cough or coryza.  No jar tenderness.  History of diverticulosis.  Denies other complaint or health concern today.   Patient Active Problem List   Diagnosis Date Noted  . Hypertension   . BPH (benign prostatic hypertrophy)   . Kidney stones   . Prostate nodule     Past Medical History  Diagnosis Date  . Hypertension   . BPH (benign prostatic hypertrophy)   . Obesity, unspecified   . Colon polyp     diverticular bleed with transfusion  . Kidney stones     15-20  . Prostate nodule [id number]  . Hyperlipidemia   . Anxiety   . Allergy   . Arthritis     Past Surgical History  Procedure Laterality Date  . Joint replacement    . Hernia repair    . Transurethral resection of prostate    . Ankle fusion      History  Substance Use Topics  . Smoking status: Never Smoker   . Smokeless tobacco: Not on file  . Alcohol Use: No    Family History  Problem Relation Age of Onset  . Prostate cancer    . Diabetes    . Diabetes Mother     Allergies  Allergen Reactions  . Aspirin     REACTION: upsets stomach  . Statins     REACTION: UPSETS STOMACH    Medication list has been reviewed and updated.  Current Outpatient Prescriptions on File Prior to Visit  Medication Sig Dispense Refill  . ALPRAZolam (XANAX) 1 MG tablet TAKE  1 TABLET BY MOUTH AT BEDTIME AS NEEDED FOR SLEEP  30 tablet  1  . azithromycin (ZITHROMAX Z-PAK) 250 MG tablet Take as directed on pack  6 tablet  0  . b complex vitamins tablet Take 1 tablet by mouth daily.        . benzonatate (TESSALON) 100 MG capsule Take 1-2 capsules (100-200 mg total) by mouth 3 (three) times daily as needed for cough.  40 capsule  0  . cholecalciferol (VITAMIN D) 1000 UNITS tablet Take 1,000 Units by mouth daily.        . febuxostat (ULORIC) 40 MG tablet Take 1 tablet (40 mg total) by mouth daily.  30 tablet  11  . fish oil-omega-3 fatty acids 1000 MG capsule Take 1 g by mouth daily.        [name] Kitchen lisinopril-hydrochlorothiazide (PRINZIDE,ZESTORETIC) 10-12.5 MG per tablet TAKE 1 TABLET EVERY DAY FOR BLOOD PRESSURE  90 tablet  3  . Multiple Vitamin (MULTIVITAMIN) tablet Take 1 tablet by mouth daily.        . potassium citrate (UROCIT-K) 10 MEQ (1080 MG) SR tablet Take 1 tablet (10 mEq total) by mouth 2 (two) times daily.  180 tablet  3  . predniSONE (DELTASONE) 20 MG tablet 2 daily with food  10 tablet  1  . sildenafil (VIAGRA) 100 MG tablet Take 1 tablet (100 mg total) by mouth as needed for erectile dysfunction.  6 tablet  11  . testosterone cypionate (DEPOTESTOTERONE CYPIONATE) 200 MG/ML injection Inject into the muscle as needed.        [name] Kitchen HYDROcodone-homatropine (HYCODAN) 5-1.5 MG/5ML syrup Take 5 mLs by mouth every 8 (eight) hours as needed for cough.  30 mL  0   No current facility-administered medications on file prior to visit.    Review of Systems:  As per HPI, otherwise negative.    Physical Examination: Filed Vitals:   12/06/13 1147  BP: 124/86  Pulse: 102  Temp: 99.9 F (37.7 C)  Resp: 16   Filed Vitals:   12/06/13 1147  Height: 6\' 1"  (1.854 m)  Weight: 289 lb 12.8 oz (131.452 kg)   Body mass index is 38.24 kg/(m^2). Ideal Body Weight: Weight in (lb) to have BMI = 25: 189.1  GEN: obese, NAD, ill appearing, A & O x 3 HEENT: Atraumatic,  Normocephalic. Neck supple. No masses, No LAD. Ears and Nose: No external deformity. CV: RRR, No M/G/R. No JVD. No thrill. No extra heart sounds. PULM: CTA B, no wheezes, crackles, rhonchi. No retractions. No resp. distress. No accessory muscle use. ABD: S, tender in right and left lower quadrants more on right, ND, +BS. Right lower quadrant direct rebound. No HSM. EXTR: No c/c/e NEURO Normal gait.  PSYCH: Normally interactive. Conversant. Not depressed or anxious appearing.  Calm demeanor.    Assessment and Plan: Acute diverticulitis cipro Flagyl Follow up 2 weeks  Signed,  Ellison Carwin, MD

## 2013-12-06 NOTE — Telephone Encounter (Signed)
Patient needs a work note for tomorrow. Had a CT scan today.  [phone]

## 2013-12-06 NOTE — Patient Instructions (Signed)
Please report to [facility]:  [place] at 4:15  Please start drinking one bottle of the contrast at 1pm. You will then need to drink the second bottle at 4pm  Do not have anything further to eat. Nothing to eat until after your scan. You may have liquids to drink.

## 2013-12-06 NOTE — Telephone Encounter (Signed)
[name] [name] from [facility] called with call report CT. See report in imaging. Patient notified acute diverticulitis medication will be sent to pharmacy. Recheck in 2 weeks sooner if worse or no improvement. He voiced understanding.

## 2013-12-07 NOTE — Telephone Encounter (Addendum)
Called and spoke with pt. He will be given a work note for 6/30-67/1 per Dr. Ouida Sills.

## 2014-01-05 ENCOUNTER — Other Ambulatory Visit: Payer: Self-pay | Admitting: Family Medicine

## 2014-01-06 NOTE — Telephone Encounter (Signed)
Dr Elder Cyphers, since you have seen pt more recently and are pt's PCP, I am forwarding this req to you for review.

## 2014-01-12 ENCOUNTER — Encounter: Payer: Self-pay | Admitting: Internal Medicine

## 2014-03-24 ENCOUNTER — Ambulatory Visit (INDEPENDENT_AMBULATORY_CARE_PROVIDER_SITE_OTHER): Payer: BC Managed Care – PPO

## 2014-03-24 ENCOUNTER — Ambulatory Visit (INDEPENDENT_AMBULATORY_CARE_PROVIDER_SITE_OTHER): Payer: BC Managed Care – PPO | Admitting: Family Medicine

## 2014-03-24 ENCOUNTER — Other Ambulatory Visit: Payer: Self-pay | Admitting: Family Medicine

## 2014-03-24 VITALS — BP 142/80 | HR 76 | Temp 98.6°F | Resp 18 | Ht 74.5 in | Wt 297.0 lb

## 2014-03-24 DIAGNOSIS — M25461 Effusion, right knee: Secondary | ICD-10-CM

## 2014-03-24 DIAGNOSIS — M25561 Pain in right knee: Secondary | ICD-10-CM

## 2014-03-24 DIAGNOSIS — M1711 Unilateral primary osteoarthritis, right knee: Secondary | ICD-10-CM

## 2014-03-24 DIAGNOSIS — M179 Osteoarthritis of knee, unspecified: Secondary | ICD-10-CM

## 2014-03-24 LAB — SYNOVIAL FLUID PANEL
Crystals, Fluid: NONE SEEN
Eosinophils-Synovial: 0 % (ref 0–1)
Glucose, Synovial Fluid: 83 mg/dL
Lymphocytes-Synovial Fld: 33 % — ABNORMAL HIGH (ref 0–20)
Monocyte/Macrophage: 1 % — ABNORMAL LOW (ref 50–90)
Neutrophil, Synovial: 66 % — ABNORMAL HIGH (ref 0–25)
Protein, Synovial Fluid: 4.1 g/dL — ABNORMAL HIGH (ref 1.0–3.0)
WBC, Synovial: 585 cu mm — ABNORMAL HIGH (ref 0–200)

## 2014-03-24 NOTE — Progress Notes (Signed)
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 He's had a problem with his left knee in the past and Dr. French Ana operated on at resolving the effusion and pain from a torn meniscus. I was over year ago.  He was doing his usual activities up until this happened. It seemed to be a spontaneous swelling because he has no remembered injury or change in activity.  The knee is diffusely swollen and painful.  Objective: Patient in no acute distress Examination of the right knee reveals diffuse swelling and tenderness in the medial and lateral joint lines. The effusion extends to above the knee it is obvious to both palpation and inspection. He is unable to bend the knee more than 90.  [facility] reading (PRIMARY) by  Dr. Joseph Art  Right knee: .large effusion with some joint space narrowing  After sterile Betadine prep and 1% Xylocaine local anesthesia, 15 cc of serosanguineous fluid was aspirated from the lateral joint line of the right knee. Then 1 cc of Marcaine and 1 cc of Depo-Medrol was instilled with at least 50% relief of pain.    assessment: Osteoarthritis of the knee with large joint effusion  Plan: Out of work until Monday, return if pain or turns. Fluid sent for joint analysis Ace wrap  Signed, Carola Frost.D.

## 2014-03-27 LAB — BODY FLUID CULTURE
Gram Stain: NONE SEEN
Gram Stain: NONE SEEN
Organism ID, Bacteria: NO GROWTH

## 2014-04-12 ENCOUNTER — Other Ambulatory Visit: Payer: Self-pay | Admitting: Family Medicine

## 2014-04-24 ENCOUNTER — Other Ambulatory Visit: Payer: Self-pay | Admitting: Family Medicine

## 2014-04-27 NOTE — Telephone Encounter (Signed)
Called in.

## 2014-05-07 ENCOUNTER — Other Ambulatory Visit: Payer: Self-pay | Admitting: Internal Medicine

## 2014-05-08 ENCOUNTER — Ambulatory Visit (INDEPENDENT_AMBULATORY_CARE_PROVIDER_SITE_OTHER): Payer: BC Managed Care – PPO | Admitting: Family Medicine

## 2014-05-08 VITALS — BP 154/91 | HR 75 | Resp 16

## 2014-05-08 DIAGNOSIS — R040 Epistaxis: Secondary | ICD-10-CM

## 2014-05-08 DIAGNOSIS — I10 Essential (primary) hypertension: Secondary | ICD-10-CM

## 2014-05-08 LAB — POCT CBC
Granulocyte percent: 58.3 %G (ref 37–80)
HEMATOCRIT: 40.5 % — AB (ref 43.5–53.7)
Hemoglobin: 12.6 g/dL — AB (ref 14.1–18.1)
LYMPH, POC: 2.2 (ref 0.6–3.4)
MCH: 26.3 pg — AB (ref 27–31.2)
MCHC: 31.2 g/dL — AB (ref 31.8–35.4)
MCV: 84.4 fL (ref 80–97)
MID (cbc): 0.2 (ref 0–0.9)
MPV: 5.3 fL (ref 0–99.8)
POC Granulocyte: 3.3 (ref 2–6.9)
POC LYMPH PERCENT: 38.6 %L (ref 10–50)
POC MID %: 3.1 %M (ref 0–12)
Platelet Count, POC: 236 10*3/uL (ref 142–424)
RBC: 4.8 M/uL (ref 4.69–6.13)
RDW, POC: 16.6 %
WBC: 5.6 10*3/uL (ref 4.6–10.2)

## 2014-05-08 LAB — PROTIME-INR
INR: 0.96 (ref <1.50)
Prothrombin Time: 12.8 seconds (ref 11.6–15.2)

## 2014-05-08 LAB — APTT: aPTT: 27 seconds (ref 24–37)

## 2014-05-08 NOTE — Progress Notes (Addendum)
Subjective:    Patient ID: [name] [name], male    DOB: 1950/08/19, 63 y.o.   MRN: [id number]  HPI Chief Complaint  Patient presents with  . Epistaxis   This chart was scribed for [name] Forts, MD by [name] [name], ED Scribe. This patient was seen in room 7 and the patient's care was started at 12:58 PM.  HPI Comments: [name] [name] is a 63 y.o. male who presents to the [facility] complaining of recurrent epistaxis from bilateral nares that began this morning. Per wife nosebleed began this morning which had stopped but restarted about 30 minutes with drainage to the back of his throat. Pt has nosebleeds frequently but has not had one in about a month. He has been seen by ENT, last visit being in June 2015, where he received a nasal spray with relief to nosebleeds but has not used spray recently.  He has undergone recurrent cauterization of R nare.  Bleeding from B nares today with bleeding down posterior pharynx as well.  No other sites of bleeding. Does not take ASA.  Denies dizziness.  Past Medical History  Diagnosis Date  . Hypertension   . BPH (benign prostatic hypertrophy)   . Obesity, unspecified   . Colon polyp     diverticular bleed with transfusion  . Kidney stones     15-20  . Prostate nodule [id number]  . Hyperlipidemia   . Anxiety   . Allergy   . Arthritis    Allergies  Allergen Reactions  . Aspirin     REACTION: upsets stomach  . Statins     REACTION: UPSETS STOMACH   Prior to Admission medications   Medication Sig Start Date End Date Taking? Authorizing Provider  ALPRAZolam [name] [name]) 1 MG tablet TAKE 1 TABLET AT BEDTIME AS NEEDED 04/27/14  Yes Orma Flaming, MD  b complex vitamins tablet Take 1 tablet by mouth daily.     Yes Historical Provider, MD  cholecalciferol (VITAMIN D) 1000 UNITS tablet Take 1,000 Units by mouth daily.     Yes Historical Provider, MD  febuxostat (ULORIC) 40 MG tablet Take 1 tablet (40 mg total) by mouth daily. 11/09/12   Yes Gay Filler Copland, MD  fish oil-omega-3 fatty acids 1000 MG capsule Take 1 g by mouth daily.     Yes Historical Provider, MD  lisinopril-hydrochlorothiazide (PRINZIDE,ZESTORETIC) 10-12.5 MG per tablet Take 1 tablet by mouth daily. PATIENT NEEDS BLOOD PRESSURE CHECK UP FOR ADDITIONAL REFILLS 04/12/14  Yes Orma Flaming, MD  Multiple Vitamin (MULTIVITAMIN) tablet Take 1 tablet by mouth daily.     Yes Historical Provider, MD  potassium citrate (UROCIT-K) 10 MEQ (1080 MG) SR tablet Take 1 tablet (10 mEq total) by mouth 2 (two) times daily. 11/09/12  Yes Gay Filler Copland, MD  testosterone cypionate (DEPOTESTOTERONE CYPIONATE) 200 MG/ML injection Inject into the muscle as needed.     Yes Historical Provider, MD  VIAGRA 100 MG tablet TAKE 1 TABLET (100 MG TOTAL) BY MOUTH AS NEEDED FOR ERECTILE DYSFUNCTION.   Yes Orma Flaming, MD   Review of Systems  Constitutional: Negative for fever, chills, diaphoresis and fatigue.  HENT: Positive for nosebleeds. Negative for congestion, ear discharge, ear pain, postnasal drip, rhinorrhea and sore throat.   Respiratory: Negative for cough and shortness of breath.   Hematological: Does not bruise/bleed easily.   Objective:   Physical Exam  Constitutional: He is oriented to person, place, and time. He appears well-developed and well-nourished. No  distress.  HENT:  Head: Normocephalic and atraumatic.  Nose: No nose lacerations, sinus tenderness, nasal deformity, septal deviation or nasal septal hematoma. Epistaxis is observed.  No foreign bodies.  Mouth/Throat: No oral lesions. No oropharyngeal exudate, posterior oropharyngeal edema or posterior oropharyngeal erythema.  Blood draining from the chamber of the right nares and a little from the left chamber with residual blood. Mild blood drainage in the posterior oropharynx.  No mucosal lesions or sites of active bleeding.   Eyes: Conjunctivae and EOM are normal.  Neck: Neck supple.  Cardiovascular: Normal rate.     Pulmonary/Chest: Effort normal.  Musculoskeletal: Normal range of motion.  Neurological: He is alert and oriented to person, place, and time.  Skin: Skin is warm and dry. He is not diaphoretic.  Psychiatric: He has a normal mood and affect. His behavior is normal.  Nursing note and vitals reviewed.   Results for orders placed or performed in visit on 05/08/14  POCT CBC  Result Value Ref Range   WBC 5.6 4.6 - 10.2 K/uL   Lymph, poc 2.2 0.6 - 3.4   POC LYMPH PERCENT 38.6 10 - 50 %L   MID (cbc) 0.2 0 - 0.9   POC MID % 3.1 0 - 12 %M   POC Granulocyte 3.3 2 - 6.9   Granulocyte percent 58.3 37 - 80 %G   RBC 4.80 4.69 - 6.13 M/uL   Hemoglobin 12.6 (A) 14.1 - 18.1 g/dL   HCT, POC 40.5 (A) 43.5 - 53.7 %   MCV 84.4 80 - 97 fL   MCH, POC 26.3 (A) 27 - 31.2 pg   MCHC 31.2 (A) 31.8 - 35.4 g/dL   RDW, POC 16.6 %   Platelet Count, POC 236 142 - 424 K/uL   MPV 5.3 0 - 99.8 fL    PROCEDURE NOTE: VERBAL CONSENT OBTAINED; RHINO ROCKET SOAKED IN STERILE WATER IN A STERILE BOWL.  RHINO ROCKET INSERTED INTO R NARE WITH MILD RESISTANCE; INSERTED RHINO ROCKET ADEQUATELY.  5 CC AIR INSUFLATED.  PATIENT OBSERVED FOR 20 MINUTES IN OFFICE AFTER PROCEDURE.   REPEAT BLOOD PRESSURE 120/75  Assessment & Plan:  Epistaxis - Plan: POCT CBC, Protime-INR, APTT  Essential hypertension, benign    1. Epistaxis R>L:  New. Consistent with posterior bleed with B nares bleeding and bleeding down OP.  S/p Rhino Rocket placement with good hemostasis. Obtain labs including PT, PTT.  Follow-up with [name] [name]/ENT in upcoming 72 hours.   2. HTN: stable.     No orders of the defined types were placed in this encounter.    I personally performed the services described in this documentation, which was scribed in my presence. The recorded information has been reviewed and considered.  [name] [name], M.D.  [facility] [address] [place], [place]  [address] [phone] phone [phone] fax

## 2014-05-08 NOTE — Patient Instructions (Signed)
1. CALL ENT TOMORROW FOR AN APPOINTMENT WITHIN 72 HOURS.   Nosebleed Nosebleeds can be caused by many conditions, including trauma, infections, polyps, foreign bodies, dry mucous membranes or climate, medicines, and air conditioning. Most nosebleeds occur in the front of the nose. Because of this location, most nosebleeds can be controlled by pinching the nostrils gently and continuously for at least 10 to 20 minutes. The long, continuous pressure allows enough time for the blood to clot. If pressure is released during that 10 to 20 minute time period, the process may have to be started again. The nosebleed may stop by itself or quit with pressure, or it may need concentrated heating (cautery) or pressure from packing. HOME CARE INSTRUCTIONS   If your nose was packed, try to maintain the pack inside until your health care provider removes it. If a gauze pack was used and it starts to fall out, gently replace it or cut the end off. Do not cut if a balloon catheter was used to pack the nose. Otherwise, do not remove unless instructed.  Avoid blowing your nose for 12 hours after treatment. This could dislodge the pack or clot and start the bleeding again.  If the bleeding starts again, sit up and bend forward, gently pinching the front half of your nose continuously for 20 minutes.  If bleeding was caused by dry mucous membranes, use over-the-counter saline nasal spray or gel. This will keep the mucous membranes moist and allow them to heal. If you must use a lubricant, choose the water-soluble variety. Use it only sparingly and not within several hours of lying down.  Do not use petroleum jelly or mineral oil, as these may drip into the lungs and cause serious problems.  Maintain humidity in your home by using less air conditioning or by using a humidifier.  Do not use aspirin or medicines which make bleeding more likely. Your health care provider can give you recommendations on this.  Resume  normal activities as you are able, but try to avoid straining, lifting, or bending at the waist for several days.  If the nosebleeds become recurrent and the cause is unknown, your health care provider may suggest laboratory tests. SEEK MEDICAL CARE IF: You have a fever. SEEK IMMEDIATE MEDICAL CARE IF:   Bleeding recurs and cannot be controlled.  There is unusual bleeding from or bruising on other parts of the body.  Nosebleeds continue.  There is any worsening of the condition which originally brought you in.  You become light-headed, feel faint, become sweaty, or vomit blood. MAKE SURE YOU:   Understand these instructions.  Will watch your condition.  Will get help right away if you are not doing well or get worse. Document Released: 03/06/2005 Document Revised: 10/11/2013 Document Reviewed: 04/27/2009 [facility] Patient Information 2015 [place], [place]. This information is not intended to replace advice given to you by your health care provider. Make sure you discuss any questions you have with your health care provider.

## 2014-05-09 ENCOUNTER — Telehealth: Payer: Self-pay

## 2014-05-09 NOTE — Telephone Encounter (Signed)
Called ENT spoke to [facility], who advised the Rhino Rocket should be in place for 5 days prior to appointment. I have left message for Leafy Ro, Dr Danie Binder assistant, as we have been advised previously to only leave in place for 72 hours.  I have given her my number to call me back, hopefully with sooner appointment.

## 2014-05-09 NOTE — Telephone Encounter (Signed)
Pt should rtc to have rhino rocket removed if not able to get into ENT sooner than 72 hours. Please advise.

## 2014-05-09 NOTE — Telephone Encounter (Signed)
Patient was treated yesterday at our walk in clinic for a nose bleed. Per spouse patient had a rhino rocket inserted into his nose and was told it would need to come out in 72 hours. Patient has an appointment at a ENT on Friday 05/13/14 @ 1pm and wants to know if this is ok. Patient's call back number is [phone]

## 2014-05-10 NOTE — Telephone Encounter (Signed)
I spoke to [facility] Dr Danie Binder assistant, she has advised Friday is the first available, and the rhino rocket should remain in place for 5 days. She indicates she has discussed with Dr Wilburn Cornelia and patient does not need to be seen before Friday, and it should not be removed prior to the appointment with him on Friday.

## 2014-05-10 NOTE — Telephone Encounter (Signed)
Please call and advise and reassure patient of recommendations by Dr. Wilburn Cornelia.

## 2014-05-11 NOTE — Telephone Encounter (Signed)
Lm for rtn call 

## 2014-05-12 NOTE — Telephone Encounter (Signed)
Lm for pt - Confirm Dr. Victorio Palm recommendations.

## 2014-09-12 ENCOUNTER — Other Ambulatory Visit: Payer: Self-pay | Admitting: Family Medicine

## 2014-09-13 ENCOUNTER — Other Ambulatory Visit: Payer: Self-pay

## 2014-09-13 MED ORDER — LISINOPRIL-HYDROCHLOROTHIAZIDE 10-12.5 MG PO TABS: 1.0000 | ORAL_TABLET | Freq: Every day | ORAL | Status: DC

## 2014-10-25 ENCOUNTER — Ambulatory Visit (INDEPENDENT_AMBULATORY_CARE_PROVIDER_SITE_OTHER): Payer: BLUE CROSS/BLUE SHIELD | Admitting: Physician Assistant

## 2014-10-25 DIAGNOSIS — J069 Acute upper respiratory infection, unspecified: Secondary | ICD-10-CM | POA: Diagnosis not present

## 2014-10-25 DIAGNOSIS — M109 Gout, unspecified: Secondary | ICD-10-CM | POA: Insufficient documentation

## 2014-10-25 DIAGNOSIS — B9789 Other viral agents as the cause of diseases classified elsewhere: Principal | ICD-10-CM

## 2014-10-25 MED ORDER — HYDROCOD POLST-CPM POLST ER 10-8 MG/5ML PO SUER: 5.0000 mL | Freq: Two times a day (BID) | ORAL | Status: DC | PRN

## 2014-10-25 MED ORDER — FLUTICASONE PROPIONATE 50 MCG/ACT NA SUSP: 2.0000 | Freq: Every day | NASAL | Status: DC

## 2014-10-25 MED ORDER — BENZONATATE 100 MG PO CAPS: ORAL_CAPSULE | ORAL | Status: AC

## 2014-10-25 MED ORDER — FIRST-MOUTHWASH BLM MT SUSP: OROMUCOSAL | Status: DC

## 2014-10-25 MED ORDER — GUAIFENESIN ER 1200 MG PO TB12: 1.0000 | ORAL_TABLET | Freq: Two times a day (BID) | ORAL | Status: AC

## 2014-10-25 NOTE — Progress Notes (Signed)
   Subjective:    Patient ID: [name] [name], male    DOB: [birth year], 64 y.o.   MRN: [id number]  HPI Pt presents to clinic with 4 day h/o cold symptoms.  He has no rhinorrhea but he is having significant PND with a sore raw throat this is worse at night.  He has a cough that seems to be coming from his throat from a tickle.  He is not sleeping well at night.  He had old left over hydrocodone syrup that has worked in the past but he tried that and it did not help this time.     Sick contacts known - none Home treatments - none  Review of Systems  Constitutional: Positive for chills (resolved now). Negative for fever.  HENT: Positive for postnasal drip and sore throat. Negative for congestion and rhinorrhea.   Respiratory: Positive for cough (dry cough from the throat). Negative for shortness of breath and wheezing.        No h/o asthma.  Non-smoker.  Neurological: Positive for headaches.  Psychiatric/Behavioral: Positive for sleep disturbance (2nd to cough).       Objective:   Physical Exam  Constitutional: He is oriented to person, place, and time. He appears well-developed and well-nourished.  HENT:  Head: Normocephalic and atraumatic.  Right Ear: Hearing, tympanic membrane, external ear and ear canal normal.  Left Ear: Hearing, tympanic membrane, external ear and ear canal normal.  Nose: Mucosal edema (erythema) present.  Mouth/Throat: Uvula is midline, oropharynx is clear and moist and mucous membranes are normal. No oropharyngeal exudate, posterior oropharyngeal edema or posterior oropharyngeal erythema.  Eyes: Conjunctivae are normal.  Neck: Normal range of motion.  Cardiovascular: Normal rate, regular rhythm and normal heart sounds.   No murmur heard. Pulmonary/Chest: Effort normal. He has no wheezes.  Lymphadenopathy:    He has no cervical adenopathy.  Neurological: He is alert and oriented to person, place, and time.  Skin: Skin is warm and dry.  Psychiatric: He has a  normal mood and affect. His behavior is normal. Judgment and thought content normal.       Assessment & Plan:  Viral URI with cough - Plan: Guaifenesin (MUCINEX MAXIMUM STRENGTH) 1200 MG TB12, fluticasone (FLONASE) 50 MCG/ACT nasal spray, DPH-Lido-AlHydr-MgHydr-Simeth (FIRST-MOUTHWASH BLM) SUSP, benzonatate (TESSALON) 100 MG capsule, chlorpheniramine-HYDROcodone (TUSSIONEX PENNKINETIC ER) 10-8 MG/5ML SUER   Symptomatic care d/w pt.  He will increase his fluids.  Windell Hummingbird PA-C  [facility] 10/25/2014 10:39 AM

## 2014-10-25 NOTE — Progress Notes (Signed)
  Medical screening examination/treatment/procedure(s) were performed by non-physician practitioner and as supervising physician I was immediately available for consultation/collaboration.     

## 2014-10-25 NOTE — Addendum Note (Signed)
Addended by: Roselee Culver on: 10/25/2014 05:17 PM   Modules accepted: Miquel Dunn

## 2014-11-21 ENCOUNTER — Other Ambulatory Visit: Payer: Self-pay | Admitting: Physician Assistant

## 2014-12-18 ENCOUNTER — Other Ambulatory Visit: Payer: Self-pay | Admitting: Family Medicine

## 2014-12-22 ENCOUNTER — Other Ambulatory Visit: Payer: Self-pay | Admitting: Physician Assistant

## 2015-02-25 ENCOUNTER — Ambulatory Visit (INDEPENDENT_AMBULATORY_CARE_PROVIDER_SITE_OTHER): Payer: BLUE CROSS/BLUE SHIELD | Admitting: Emergency Medicine

## 2015-02-25 VITALS — BP 126/72 | HR 73 | Temp 98.4°F | Resp 18 | Ht 74.5 in | Wt 299.6 lb

## 2015-02-25 DIAGNOSIS — J209 Acute bronchitis, unspecified: Secondary | ICD-10-CM

## 2015-02-25 DIAGNOSIS — Z23 Encounter for immunization: Secondary | ICD-10-CM

## 2015-02-25 DIAGNOSIS — J0140 Acute pansinusitis, unspecified: Secondary | ICD-10-CM

## 2015-02-25 MED ORDER — LISINOPRIL-HYDROCHLOROTHIAZIDE 10-12.5 MG PO TABS: ORAL_TABLET | ORAL | Status: DC

## 2015-02-25 MED ORDER — AMOXICILLIN-POT CLAVULANATE 875-125 MG PO TABS: 1.0000 | ORAL_TABLET | Freq: Two times a day (BID) | ORAL | Status: DC

## 2015-02-25 MED ORDER — PSEUDOEPHEDRINE-GUAIFENESIN ER 60-600 MG PO TB12: 1.0000 | ORAL_TABLET | Freq: Two times a day (BID) | ORAL | Status: AC

## 2015-02-25 MED ORDER — HYDROCOD POLST-CPM POLST ER 10-8 MG/5ML PO SUER: 5.0000 mL | Freq: Two times a day (BID) | ORAL | Status: DC

## 2015-02-25 NOTE — Progress Notes (Signed)
Subjective:  Patient ID: [name] [name], male    DOB: 1950-06-13  Age: 64 y.o. MRN: [id number]  CC: Bronchitis and Flu Vaccine   HPI Nagi Furio presents  with nasal congestion postnasal drainage productive of purulent mucus. He has postnasal drainage and a sore throat. He has a cough productive of purulent sputum. He has no fever or chills. No wheezing or shortness of breath. No nausea vomiting. No rash stool change. Said no improvement with over-the-counter medication  History Demetreus has a past medical history of Hypertension; BPH (benign prostatic hypertrophy); Obesity, unspecified; Colon polyp; Kidney stones; Prostate nodule ([id number]); Hyperlipidemia; Anxiety; Allergy; and Arthritis.   He has past surgical history that includes Joint replacement; Hernia repair; Transurethral resection of prostate; and Ankle Fusion.   His  family history includes Diabetes in his mother and another family member; Prostate cancer in an other family member.  He   reports that he has never smoked. He does not have any smokeless tobacco history on file. He reports that he does not drink alcohol or use illicit drugs.  Outpatient Prescriptions Prior to Visit  Medication Sig Dispense Refill  . ALPRAZolam (XANAX) 1 MG tablet TAKE 1 TABLET AT BEDTIME AS NEEDED 60 tablet 1  . b complex vitamins tablet Take 1 tablet by mouth daily.      . cholecalciferol (VITAMIN D) 1000 UNITS tablet Take 1,000 Units by mouth daily.      . fish oil-omega-3 fatty acids 1000 MG capsule Take 1 g by mouth daily.      . fluticasone (FLONASE) 50 MCG/ACT nasal spray PLACE 2 SPRAYS INTO BOTH NOSTRILS EVERY DAY. 16 g 0  . Multiple Vitamin (MULTIVITAMIN) tablet Take 1 tablet by mouth daily.      [name] Kitchen lisinopril-hydrochlorothiazide (PRINZIDE,ZESTORETIC) 10-12.5 MG per tablet TAKE 1 TABLET BY MOUTH DAILY.  "OV NEEDED FOR REFILLS" 30 tablet 0  . chlorpheniramine-HYDROcodone (TUSSIONEX PENNKINETIC ER) 10-8 MG/5ML SUER Take 5 mLs by  mouth 2 (two) times daily as needed for cough. 70 mL 0  . DPH-Lido-AlHydr-MgHydr-Simeth (FIRST-MOUTHWASH BLM) SUSP 5 ml every 1-2h prn sore throat - swish gargle spit. 120 mL 0  . febuxostat (ULORIC) 40 MG tablet Take 1 tablet (40 mg total) by mouth daily. 30 tablet 11  . potassium citrate (UROCIT-K) 10 MEQ (1080 MG) SR tablet Take 1 tablet (10 mEq total) by mouth 2 (two) times daily. 180 tablet 3  . testosterone cypionate (DEPOTESTOTERONE CYPIONATE) 200 MG/ML injection Inject into the muscle as needed.      [name] Kitchen VIAGRA 100 MG tablet TAKE 1 TABLET (100 MG TOTAL) BY MOUTH AS NEEDED FOR ERECTILE DYSFUNCTION. 6 tablet 2   No facility-administered medications prior to visit.    Social History   Social History  . Marital Status: Married    Spouse Name: N/A  . Number of Children: 2  . Years of Education: N/A   Social History Main Topics  . Smoking status: Never Smoker   . Smokeless tobacco: None  . Alcohol Use: No  . Drug Use: No  . Sexual Activity: No   Other Topics Concern  . None   Social History Narrative     Review of Systems  Constitutional: Negative for fever, chills and appetite change.  HENT: Positive for congestion, postnasal drip, rhinorrhea and sore throat. Negative for ear pain and sinus pressure.   Eyes: Negative for pain and redness.  Respiratory: Positive for cough. Negative for shortness of breath and wheezing.   Cardiovascular: Negative for leg  swelling.  Gastrointestinal: Negative for nausea, vomiting, abdominal pain, diarrhea, constipation and blood in stool.  Endocrine: Negative for polyuria.  Genitourinary: Negative for dysuria, urgency, frequency and flank pain.  Musculoskeletal: Negative for gait problem.  Skin: Negative for rash.  Neurological: Negative for weakness and headaches.  Psychiatric/Behavioral: Negative for confusion and decreased concentration. The patient is not nervous/anxious.     Objective:  BP 126/72 mmHg  Pulse 73  Temp(Src) 98.4 F  (36.9 C) (Oral)  Resp 18  Ht 6' 2.5" (1.892 m)  Wt 299 lb 9.6 oz (135.898 kg)  BMI 37.96 kg/m2  SpO2 97%  Physical Exam  Constitutional: He is oriented to person, place, and time. He appears well-developed and well-nourished. No distress.  HENT:  Head: Normocephalic and atraumatic.  Right Ear: External ear normal.  Left Ear: External ear normal.  Nose: Nose normal.  Eyes: Conjunctivae and EOM are normal. Pupils are equal, round, and reactive to light. No scleral icterus.  Neck: Normal range of motion. Neck supple. No tracheal deviation present.  Cardiovascular: Normal rate, regular rhythm and normal heart sounds.   Pulmonary/Chest: Effort normal. No respiratory distress. He has no wheezes. He has no rales.  Abdominal: He exhibits no mass. There is no tenderness. There is no rebound and no guarding.  Musculoskeletal: He exhibits no edema.  Lymphadenopathy:    He has no cervical adenopathy.  Neurological: He is alert and oriented to person, place, and time. Coordination normal.  Skin: Skin is warm and dry. No rash noted.  Psychiatric: He has a normal mood and affect. His behavior is normal.      Assessment & Plan:   Verlyn was seen today for bronchitis and flu vaccine.  Diagnoses and all orders for this visit:  Acute bronchitis, unspecified organism  Acute pansinusitis, recurrence not specified  Flu vaccine need -     Flu Vaccine QUAD 36+ mos IM  Other orders -     lisinopril-hydrochlorothiazide (PRINZIDE,ZESTORETIC) 10-12.5 MG per tablet; TAKE 1 TABLET BY MOUTH DAILY. -     amoxicillin-clavulanate (AUGMENTIN) 875-125 MG per tablet; Take 1 tablet by mouth 2 (two) times daily. -     pseudoephedrine-guaifenesin (MUCINEX D) 60-600 MG per tablet; Take 1 tablet by mouth every 12 (twelve) hours. -     chlorpheniramine-HYDROcodone (TUSSIONEX PENNKINETIC ER) 10-8 MG/5ML SUER; Take 5 mLs by mouth 2 (two) times daily.   I have discontinued Mr. Bhola testosterone cypionate,  febuxostat, potassium citrate, VIAGRA, FIRST-MOUTHWASH BLM, and chlorpheniramine-HYDROcodone. I have also changed his lisinopril-hydrochlorothiazide. Additionally, I am having him start on amoxicillin-clavulanate, pseudoephedrine-guaifenesin, and chlorpheniramine-HYDROcodone. Lastly, I am having him maintain his fish oil-omega-3 fatty acids, cholecalciferol, multivitamin, b complex vitamins, ALPRAZolam, fluticasone, and tadalafil.  Meds ordered this encounter  Medications  . tadalafil (CIALIS) 20 MG tablet    Sig: Take 20 mg by mouth daily as needed for erectile dysfunction.  [name] Kitchen lisinopril-hydrochlorothiazide (PRINZIDE,ZESTORETIC) 10-12.5 MG per tablet    Sig: TAKE 1 TABLET BY MOUTH DAILY.    Dispense:  90 tablet    Refill:  3  . amoxicillin-clavulanate (AUGMENTIN) 875-125 MG per tablet    Sig: Take 1 tablet by mouth 2 (two) times daily.    Dispense:  20 tablet    Refill:  0  . pseudoephedrine-guaifenesin (MUCINEX D) 60-600 MG per tablet    Sig: Take 1 tablet by mouth every 12 (twelve) hours.    Dispense:  18 tablet    Refill:  0  . chlorpheniramine-HYDROcodone (TUSSIONEX PENNKINETIC ER) 10-8  MG/5ML SUER    Sig: Take 5 mLs by mouth 2 (two) times daily.    Dispense:  60 mL    Refill:  0    Appropriate red flag conditions were discussed with the patient as well as actions that should be taken.  Patient expressed his understanding.  Follow-up: Return if symptoms worsen or fail to improve.  Roselee Culver, MD

## 2015-02-25 NOTE — Patient Instructions (Signed)

## 2015-03-06 ENCOUNTER — Other Ambulatory Visit: Payer: Self-pay | Admitting: Internal Medicine

## 2015-03-07 NOTE — Telephone Encounter (Signed)
Dr Elder Cyphers you haven't seen pt since 08/2013 for med review or Dx relevant to this med. Pt has not seen any other provider for anxiety, although he has been in several times for acute issues, including this month. Do you want to deny this w/note to RTC?

## 2015-04-24 ENCOUNTER — Encounter: Payer: Self-pay | Admitting: Cardiology

## 2016-04-05 ENCOUNTER — Ambulatory Visit (INDEPENDENT_AMBULATORY_CARE_PROVIDER_SITE_OTHER): Payer: Managed Care, Other (non HMO)

## 2016-04-05 ENCOUNTER — Ambulatory Visit (INDEPENDENT_AMBULATORY_CARE_PROVIDER_SITE_OTHER): Payer: Managed Care, Other (non HMO) | Admitting: Physician Assistant

## 2016-04-05 VITALS — BP 124/70 | HR 71 | Temp 98.5°F | Resp 18 | Ht 74.5 in | Wt 304.0 lb

## 2016-04-05 DIAGNOSIS — R51 Headache: Secondary | ICD-10-CM

## 2016-04-05 DIAGNOSIS — R519 Headache, unspecified: Secondary | ICD-10-CM

## 2016-04-05 DIAGNOSIS — Z23 Encounter for immunization: Secondary | ICD-10-CM

## 2016-04-05 LAB — POCT SEDIMENTATION RATE: POCT SED RATE: 12 mm/h (ref 0–22)

## 2016-04-05 MED ORDER — AZITHROMYCIN 250 MG PO TABS: ORAL_TABLET | ORAL | 0 refills | Status: DC

## 2016-04-05 MED ORDER — CETIRIZINE-PSEUDOEPHEDRINE ER 5-120 MG PO TB12: 1.0000 | ORAL_TABLET | Freq: Two times a day (BID) | ORAL | 0 refills | Status: DC

## 2016-04-05 NOTE — Patient Instructions (Addendum)
Take tylenol 1000 mg every eight hours to control HA.  Start zyrtec-D today.  I will call you if the sed rate comes back elevated which will indicate the need for an antibiotic.  If your symptoms go on for ten days then it is okay to go ahead and fill the antibiotic despite negative work up.     IF you received an x-ray today, you will receive an invoice from [facility]. Please contact [facility] at [phone] with questions or concerns regarding your invoice.   IF you received labwork today, you will receive an invoice from [employer]. Please contact [facility] at [phone] with questions or concerns regarding your invoice.   Our billing staff will not be able to assist you with questions regarding bills from these companies.  You will be contacted with the lab results as soon as they are available. The fastest way to get your results is to activate your My Chart account. Instructions are located on the last page of this paperwork. If you have not heard from [place] regarding the results in 2 weeks, please contact this office.

## 2016-04-05 NOTE — Progress Notes (Signed)
04/05/2016 11:34 AM   DOB: 1950-07-18 / MRN: [id number]  SUBJECTIVE:  [name] [name] is a 65 y.o. male presenting for sinus congestion and HA that started 3 days ago.  He has tried OTC pain medication and this has failed.  He has also tried Afrin, twice daily for 1 day.  CThe bilateral dull HA is what is mostly bothering him today.  The HA is made worse with leaning forward.  He denies fever, chills, nausea, night sweats, nausea.      He is allergic to aspirin and statins.   He  has a past medical history of Allergy; Anxiety; Arthritis; BPH (benign prostatic hypertrophy); Colon polyp; Hyperlipidemia; Hypertension; Kidney stones; Obesity, unspecified; and Prostate nodule ([id number]).    He  reports that he has never smoked. He has never used smokeless tobacco. He reports that he does not drink alcohol or use drugs. He  reports that he does not engage in sexual activity. The patient  has a past surgical history that includes Joint replacement; Hernia repair; Transurethral resection of prostate; and Ankle Fusion.  His family history includes Diabetes in his mother.  Review of Systems  Constitutional: Negative for fever.  HENT: Positive for congestion. Negative for sore throat.   Respiratory: Negative for cough.   Gastrointestinal: Negative for nausea.  Skin: Negative for rash.  Neurological: Positive for headaches. Negative for dizziness, tingling and focal weakness.  Psychiatric/Behavioral: Negative for depression.    The problem list and medications were reviewed and updated by myself where necessary and exist elsewhere in the encounter.   OBJECTIVE:  BP 124/70 (BP Location: Right Arm, Patient Position: Sitting, Cuff Size: Large)   Pulse 71   Temp 98.5 F (36.9 C) (Oral)   Resp 18   Ht 6' 2.5" (1.892 m)   Wt (!) 304 lb (137.9 kg)   SpO2 98%   BMI 38.51 kg/m    Body surface area is 2.69 meters squared.   Physical Exam  Constitutional: He is oriented to person, place, and  time. He appears well-developed and well-nourished. He appears ill.  HENT:  Mouth/Throat: Oropharynx is clear and moist.  Cardiovascular: Normal rate, regular rhythm and normal heart sounds.  Exam reveals no gallop and no friction rub.   No murmur heard. Musculoskeletal: Normal range of motion.  Neurological: He is alert and oriented to person, place, and time. He has normal reflexes. He displays normal reflexes. No cranial nerve deficit. He exhibits normal muscle tone. Coordination normal.  Skin: Skin is warm and dry. He is not diaphoretic.  Psychiatric: He has a normal mood and affect.    Lab Results  Component Value Date   CREATININE 1.13 12/06/2013   Lab Results  Component Value Date   ALT 29 12/06/2013   AST 24 12/06/2013   ALKPHOS 56 12/06/2013   BILITOT 0.5 12/06/2013     Results for orders placed or performed in visit on 04/05/16 (from the past 72 hour(s))  POCT SEDIMENTATION RATE     Status: None   Collection Time: 04/05/16 11:17 AM  Result Value Ref Range   POCT SED RATE 12 0 - 22 mm/hr    Dg Sinuses Complete  Result Date: 04/05/2016 CLINICAL DATA:  Sinus headache for 3 days. EXAM: PARANASAL SINUSES - COMPLETE 3 + VIEW COMPARISON:  Radiographs 11/24/2006. FINDINGS: The paranasal sinus are aerated. There is no evidence of sinus opacification air-fluid levels or mucosal thickening. No significant bone abnormalities are seen. Grossly stable dural calcifications. IMPRESSION: No evidence  of active sinus disease. Electronically Signed   By: Richardean Sale M.D.   On: 04/05/2016 10:15    ASSESSMENT AND PLAN  [name] [name] was seen today for sinusitis.  Diagnoses and all orders for this visit:  Sinus headache: Rads negative.  I will call him if the sed rate is positive so he may fill the z-pack. If negative he will take 400 mg iburpofen q8 and Tylenol 1000 mg q8 along with zyrtec-d.    -     POCT SEDIMENTATION RATE -     DG Sinuses Complete; Future  Need for prophylactic  vaccination and inoculation against influenza -     Flu Vaccine QUAD 36+ mos IM    The patient is advised to call or return to clinic if he does not see an improvement in symptoms, or to seek the care of the closest emergency department if he worsens with the above plan.   Philis Fendt, MHS, PA-C [facility] 04/05/2016 11:34 AM

## 2016-04-16 ENCOUNTER — Other Ambulatory Visit: Payer: Self-pay

## 2016-04-16 MED ORDER — LISINOPRIL-HYDROCHLOROTHIAZIDE 10-12.5 MG PO TABS: ORAL_TABLET | ORAL | 0 refills | Status: DC

## 2016-04-16 NOTE — Telephone Encounter (Signed)
Refilled x 30 days with note to follow up in next 2 weeks for evaluation of HTN prior to further refills.

## 2016-06-09 ENCOUNTER — Other Ambulatory Visit: Payer: Self-pay | Admitting: Physician Assistant

## 2016-06-27 ENCOUNTER — Other Ambulatory Visit: Payer: Self-pay | Admitting: Physician Assistant

## 2016-06-28 ENCOUNTER — Encounter: Payer: Self-pay | Admitting: Gastroenterology

## 2016-06-28 MED ORDER — LISINOPRIL-HYDROCHLOROTHIAZIDE 10-12.5 MG PO TABS: ORAL_TABLET | ORAL | 0 refills | Status: DC

## 2016-07-25 ENCOUNTER — Other Ambulatory Visit: Payer: Self-pay | Admitting: Physician Assistant

## 2016-09-07 ENCOUNTER — Other Ambulatory Visit: Payer: Self-pay | Admitting: Family Medicine

## 2016-10-13 ENCOUNTER — Other Ambulatory Visit: Payer: Self-pay | Admitting: Physician Assistant

## 2016-11-20 ENCOUNTER — Other Ambulatory Visit: Payer: Self-pay | Admitting: Physician Assistant

## 2016-11-22 NOTE — Telephone Encounter (Signed)
mychart message sent to pt about making appt

## 2016-12-19 ENCOUNTER — Ambulatory Visit (INDEPENDENT_AMBULATORY_CARE_PROVIDER_SITE_OTHER): Payer: Managed Care, Other (non HMO) | Admitting: Emergency Medicine

## 2016-12-19 ENCOUNTER — Encounter: Payer: Self-pay | Admitting: Emergency Medicine

## 2016-12-19 VITALS — BP 146/79 | HR 90 | Temp 98.5°F | Resp 16 | Ht 72.0 in | Wt 309.2 lb

## 2016-12-19 DIAGNOSIS — J209 Acute bronchitis, unspecified: Secondary | ICD-10-CM | POA: Diagnosis not present

## 2016-12-19 DIAGNOSIS — R05 Cough: Secondary | ICD-10-CM | POA: Diagnosis not present

## 2016-12-19 DIAGNOSIS — R059 Cough, unspecified: Secondary | ICD-10-CM

## 2016-12-19 MED ORDER — PROMETHAZINE-CODEINE 6.25-10 MG/5ML PO SYRP: 5.0000 mL | ORAL_SOLUTION | Freq: Every evening | ORAL | 0 refills | Status: DC | PRN

## 2016-12-19 MED ORDER — AMOXICILLIN-POT CLAVULANATE 875-125 MG PO TABS: 1.0000 | ORAL_TABLET | Freq: Two times a day (BID) | ORAL | 0 refills | Status: AC

## 2016-12-19 NOTE — Progress Notes (Signed)
[name] [name] 66 y.o.   Chief Complaint  Patient presents with  . Cough    x2 weeks non-productive    HISTORY OF PRESENT ILLNESS: This is a 66 y.o. male complaining of dry cough x 2 weeks.  Cough  This is a new problem. The current episode started 1 to 4 weeks ago. The problem has been gradually worsening. The problem occurs constantly. The cough is non-productive. Associated symptoms include headaches. Pertinent negatives include no chest pain, chills, fever, heartburn, hemoptysis, myalgias, rash, rhinorrhea, sore throat, shortness of breath or wheezing. Risk factors: none. He has tried OTC cough suppressant for the symptoms. There is no history of asthma, bronchitis, COPD, emphysema or pneumonia.     Prior to Admission medications   Medication Sig Start Date End Date Taking? Authorizing Provider  ALPRAZolam Duanne Moron) 1 MG tablet TAKE 1 TABLET AT BEDTIME AS NEEDED 04/27/14  Yes Orma Flaming, MD  b complex vitamins tablet Take 1 tablet by mouth daily.     Yes [provider]  cetirizine-pseudoephedrine (ZYRTEC-D ALLERGY & CONGESTION) 5-120 MG tablet Take 1 tablet by mouth 2 (two) times daily. 04/05/16  Yes Tereasa Coop, PA-C  cholecalciferol (VITAMIN D) 1000 UNITS tablet Take 1,000 Units by mouth daily.     Yes [provider]  fish oil-omega-3 fatty acids 1000 MG capsule Take 1 g by mouth daily.     Yes [provider]  lisinopril-hydrochlorothiazide (PRINZIDE,ZESTORETIC) 10-12.5 MG tablet TAKE 1 TABLET BY MOUTH EVERY DAY 11/21/16  Yes Weber, Damaris Hippo, PA-C  Multiple Vitamin (MULTIVITAMIN) tablet Take 1 tablet by mouth daily.     Yes [provider]  azithromycin (ZITHROMAX) 250 MG tablet Take two on day one and one daily thereafter. Patient not taking: Reported on 12/19/2016 04/05/16   Tereasa Coop, PA-C  tadalafil (CIALIS) 20 MG tablet Take 20 mg by mouth daily as needed for erectile dysfunction.    [provider]    Allergies    Allergen Reactions  . Aspirin     REACTION: upsets stomach  . Statins     REACTION: UPSETS STOMACH    Patient Active Problem List   Diagnosis Date Noted  . Gout 10/25/2014  . Hypertension   . BPH (benign prostatic hypertrophy)   . Kidney stones   . Prostate nodule     Past Medical History:  Diagnosis Date  . Allergy   . Anxiety   . Arthritis   . BPH (benign prostatic hypertrophy)   . Colon polyp    diverticular bleed with transfusion  . Hyperlipidemia   . Hypertension   . Kidney stones    15-20  . Obesity, unspecified   . Prostate nodule [id number]    Past Surgical History:  Procedure Laterality Date  . ANKLE FUSION    . HERNIA REPAIR    . JOINT REPLACEMENT    . TRANSURETHRAL RESECTION OF PROSTATE      Social History   Social History  . Marital status: Married    Spouse name: N/A  . Number of children: 2  . Years of education: N/A   Occupational History  . Not on file.   Social History Main Topics  . Smoking status: Never Smoker  . Smokeless tobacco: Never Used  . Alcohol use No  . Drug use: No  . Sexual activity: No   Other Topics Concern  . Not on file   Social History Narrative  . No narrative on file  Family History  Problem Relation Age of Onset  . Diabetes Mother   . Prostate cancer Unknown   . Diabetes Unknown      Review of Systems  Constitutional: Negative.  Negative for chills, fever and malaise/fatigue.  HENT: Negative for congestion, nosebleeds, rhinorrhea and sore throat.   Eyes: Negative.  Negative for blurred vision and double vision.  Respiratory: Positive for cough. Negative for hemoptysis, shortness of breath and wheezing.   Cardiovascular: Negative for chest pain, palpitations, claudication and leg swelling.  Gastrointestinal: Negative for abdominal pain, diarrhea, heartburn, nausea and vomiting.  Musculoskeletal: Negative for back pain, myalgias and neck pain.  Skin: Negative for rash.  Neurological: Positive for  headaches.  Endo/Heme/Allergies: Negative.   All other systems reviewed and are negative.  Vitals:   12/19/16 0853  BP: (!) 146/79  Pulse: 90  Resp: 16  Temp: 98.5 F (36.9 C)     Physical Exam  Constitutional: He is oriented to person, place, and time. He appears well-developed.  Obese.  HENT:  Head: Normocephalic and atraumatic.  Nose: Nose normal.  Mouth/Throat: Oropharynx is clear and moist. No oropharyngeal exudate.  Eyes: Pupils are equal, round, and reactive to light. Conjunctivae and EOM are normal.  Neck: Normal range of motion. Neck supple. No JVD present.  Cardiovascular: Normal rate, regular rhythm and normal heart sounds.   Pulmonary/Chest: Effort normal and breath sounds normal.  Abdominal: Soft. There is no tenderness.  Musculoskeletal: Normal range of motion.  Lymphadenopathy:    He has no cervical adenopathy.  Neurological: He is alert and oriented to person, place, and time. No sensory deficit. He exhibits normal muscle tone.  Skin: Skin is warm and dry. Capillary refill takes less than 2 seconds.  Psychiatric: He has a normal mood and affect. His behavior is normal.  Vitals reviewed.    ASSESSMENT & PLAN: [name] [name] was seen today for cough.  Diagnoses and all orders for this visit:  Acute bronchitis, unspecified organism -     amoxicillin-clavulanate (AUGMENTIN) 875-125 MG tablet; Take 1 tablet by mouth 2 (two) times daily.  Cough -     promethazine-codeine (PHENERGAN WITH CODEINE) 6.25-10 MG/5ML syrup; Take 5 mLs by mouth at bedtime as needed for cough.    Patient Instructions       IF you received an x-ray today, you will receive an invoice from [facility]. Please contact [facility] at [phone] with questions or concerns regarding your invoice.   IF you received labwork today, you will receive an invoice from [facility]. Please contact [facility] at [phone] with questions or concerns regarding your invoice.   Our  billing staff will not be able to assist you with questions regarding bills from these companies.  You will be contacted with the lab results as soon as they are available. The fastest way to get your results is to activate your My Chart account. Instructions are located on the last page of this paperwork. If you have not heard from [place] regarding the results in 2 weeks, please contact this office.      Acute Bronchitis, Adult Acute bronchitis is when air tubes (bronchi) in the lungs suddenly get swollen. The condition can make it hard to breathe. It can also cause these symptoms:  A cough.  Coughing up clear, yellow, or green mucus.  Wheezing.  Chest congestion.  Shortness of breath.  A fever.  Body aches.  Chills.  A sore throat.  Follow these instructions at home: Medicines  Take over-the-counter  and prescription medicines only as told by your doctor.  If you were prescribed an antibiotic medicine, take it as told by your doctor. Do not stop taking the antibiotic even if you start to feel better. General instructions  Rest.  Drink enough fluids to keep your pee (urine) clear or pale yellow.  Avoid smoking and secondhand smoke. If you smoke and you need help quitting, ask your doctor. Quitting will help your lungs heal faster.  Use an inhaler, cool mist vaporizer, or humidifier as told by your doctor.  Keep all follow-up visits as told by your doctor. This is important. How is this prevented? To lower your risk of getting this condition again:  Wash your hands often with soap and water. If you cannot use soap and water, use hand sanitizer.  Avoid contact with people who have cold symptoms.  Try not to touch your hands to your mouth, nose, or eyes.  Make sure to get the flu shot every year.  Contact a doctor if:  Your symptoms do not get better in 2 weeks. Get help right away if:  You cough up blood.  You have chest pain.  You have very bad shortness of  breath.  You become dehydrated.  You faint (pass out) or keep feeling like you are going to pass out.  You keep throwing up (vomiting).  You have a very bad headache.  Your fever or chills gets worse. This information is not intended to replace advice given to you by your health care provider. Make sure you discuss any questions you have with your health care provider. Document Released: 11/13/2007 Document Revised: 01/03/2016 Document Reviewed: 11/15/2015 Elsevier Interactive Patient Education  2017 Elsevier Inc.      [name], MD [facility]

## 2016-12-19 NOTE — Patient Instructions (Addendum)
     IF you received an x-ray today, you will receive an invoice from [facility]. Please contact [facility] at [phone] with questions or concerns regarding your invoice.   IF you received labwork today, you will receive an invoice from [facility]. Please contact [facility] at [phone] with questions or concerns regarding your invoice.   Our billing staff will not be able to assist you with questions regarding bills from these companies.  You will be contacted with the lab results as soon as they are available. The fastest way to get your results is to activate your My Chart account. Instructions are located on the last page of this paperwork. If you have not heard from us regarding the results in 2 weeks, please contact this office.      Acute Bronchitis, Adult Acute bronchitis is when air tubes (bronchi) in the lungs suddenly get swollen. The condition can make it hard to breathe. It can also cause these symptoms:  A cough.  Coughing up clear, yellow, or green mucus.  Wheezing.  Chest congestion.  Shortness of breath.  A fever.  Body aches.  Chills.  A sore throat.  Follow these instructions at home: Medicines  Take over-the-counter and prescription medicines only as told by your doctor.  If you were prescribed an antibiotic medicine, take it as told by your doctor. Do not stop taking the antibiotic even if you start to feel better. General instructions  Rest.  Drink enough fluids to keep your pee (urine) clear or pale yellow.  Avoid smoking and secondhand smoke. If you smoke and you need help quitting, ask your doctor. Quitting will help your lungs heal faster.  Use an inhaler, cool mist vaporizer, or humidifier as told by your doctor.  Keep all follow-up visits as told by your doctor. This is important. How is this prevented? To lower your risk of getting this condition again:  Wash your hands often with soap and water. If you cannot  use soap and water, use hand sanitizer.  Avoid contact with people who have cold symptoms.  Try not to touch your hands to your mouth, nose, or eyes.  Make sure to get the flu shot every year.  Contact a doctor if:  Your symptoms do not get better in 2 weeks. Get help right away if:  You cough up blood.  You have chest pain.  You have very bad shortness of breath.  You become dehydrated.  You faint (pass out) or keep feeling like you are going to pass out.  You keep throwing up (vomiting).  You have a very bad headache.  Your fever or chills gets worse. This information is not intended to replace advice given to you by your health care provider. Make sure you discuss any questions you have with your health care provider. Document Released: 11/13/2007 Document Revised: 01/03/2016 Document Reviewed: 11/15/2015 Elsevier Interactive Patient Education  2017 [employer].  

## 2017-01-09 ENCOUNTER — Ambulatory Visit (INDEPENDENT_AMBULATORY_CARE_PROVIDER_SITE_OTHER): Payer: Managed Care, Other (non HMO) | Admitting: Emergency Medicine

## 2017-01-09 ENCOUNTER — Encounter: Payer: Self-pay | Admitting: Emergency Medicine

## 2017-01-09 VITALS — BP 124/70 | HR 74 | Temp 98.6°F | Resp 16 | Ht 73.0 in | Wt 306.4 lb

## 2017-01-09 DIAGNOSIS — I10 Essential (primary) hypertension: Secondary | ICD-10-CM | POA: Diagnosis not present

## 2017-01-09 DIAGNOSIS — Z0000 Encounter for general adult medical examination without abnormal findings: Secondary | ICD-10-CM

## 2017-01-09 MED ORDER — LISINOPRIL-HYDROCHLOROTHIAZIDE 10-12.5 MG PO TABS: 1.0000 | ORAL_TABLET | Freq: Every day | ORAL | 3 refills | Status: DC

## 2017-01-09 MED ORDER — ALPRAZOLAM 1 MG PO TABS: 1.0000 mg | ORAL_TABLET | Freq: Every evening | ORAL | 1 refills | Status: DC | PRN

## 2017-01-09 NOTE — Progress Notes (Addendum)
[name] [name] 66 y.o.   Chief Complaint  Patient presents with  . Annual Exam  . Medication Refill    Lisinopril-hctz and Xanax    HISTORY OF PRESENT ILLNESS: This is a 66 y.o. male here for annual exam and medication refills for HTN; has no complaints or medical concerns.  HPI   Prior to Admission medications   Medication Sig Start Date End Date Taking? Authorizing Provider  ALPRAZolam Duanne Moron) 1 MG tablet TAKE 1 TABLET AT BEDTIME AS NEEDED 04/27/14  Yes Orma Flaming, MD  b complex vitamins tablet Take 1 tablet by mouth daily.     Yes [provider]  cholecalciferol (VITAMIN D) 1000 UNITS tablet Take 1,000 Units by mouth daily.     Yes [provider]  fish oil-omega-3 fatty acids 1000 MG capsule Take 1 g by mouth daily.     Yes [provider]  lisinopril-hydrochlorothiazide (PRINZIDE,ZESTORETIC) 10-12.5 MG tablet TAKE 1 TABLET BY MOUTH EVERY DAY 11/21/16  Yes Weber, Damaris Hippo, PA-C  Multiple Vitamin (MULTIVITAMIN) tablet Take 1 tablet by mouth daily.     Yes [provider]    Allergies  Allergen Reactions  . Aspirin     REACTION: upsets stomach  . Statins     REACTION: UPSETS STOMACH    Patient Active Problem List   Diagnosis Date Noted  . Acute bronchitis 12/19/2016  . Gout 10/25/2014  . Cough 02/18/2012  . Hypertension   . BPH (benign prostatic hypertrophy)   . Kidney stones   . Prostate nodule     Past Medical History:  Diagnosis Date  . Allergy   . Anxiety   . Arthritis   . BPH (benign prostatic hypertrophy)   . Colon polyp    diverticular bleed with transfusion  . Hyperlipidemia   . Hypertension   . Kidney stones    15-20  . Obesity, unspecified   . Prostate nodule [id number]    Past Surgical History:  Procedure Laterality Date  . ANKLE FUSION    . HERNIA REPAIR    . JOINT REPLACEMENT    . TRANSURETHRAL RESECTION OF PROSTATE      Social History   Social History  . Marital status: Married    Spouse  name: N/A  . Number of children: 2  . Years of education: N/A   Occupational History  . Not on file.   Social History Main Topics  . Smoking status: Never Smoker  . Smokeless tobacco: Never Used  . Alcohol use No  . Drug use: No  . Sexual activity: No   Other Topics Concern  . Not on file   Social History Narrative  . No narrative on file    Family History  Problem Relation Age of Onset  . Diabetes Mother   . Prostate cancer Unknown   . Diabetes Unknown      Review of Systems  Constitutional: Negative.  Negative for chills, fever and weight loss.  HENT: Negative.  Negative for congestion, ear pain, hearing loss, nosebleeds and sore throat.   Eyes: Negative.  Negative for discharge and redness.  Respiratory: Negative.  Negative for cough, shortness of breath, wheezing and stridor.   Cardiovascular: Negative.  Negative for chest pain, palpitations, leg swelling and PND.  Gastrointestinal: Negative.  Negative for abdominal pain, blood in stool, diarrhea, melena, nausea and vomiting.  Genitourinary: Negative.  Negative for dysuria, flank pain and hematuria.  Musculoskeletal: Negative.  Negative for back pain, myalgias and neck pain.  Skin: Negative.  Negative for rash.  Neurological: Negative.  Negative for dizziness, sensory change, focal weakness and headaches.  Endo/Heme/Allergies: Negative.   All other systems reviewed and are negative.   Vitals:   01/09/17 0904  BP: 124/70  Pulse: 74  Resp: 16  Temp: 98.6 F (37 C)    Physical Exam  Constitutional: He is oriented to person, place, and time. He appears well-developed and well-nourished.  HENT:  Head: Normocephalic and atraumatic.  Right Ear: External ear normal.  Left Ear: External ear normal.  Nose: Nose normal.  Mouth/Throat: Oropharynx is clear and moist.  Eyes: Pupils are equal, round, and reactive to light. Conjunctivae and EOM are normal.  Neck: Normal range of motion. Neck supple. No JVD present.  No thyromegaly present.  Cardiovascular: Normal rate, regular rhythm, normal heart sounds and intact distal pulses.   Pulmonary/Chest: Effort normal and breath sounds normal.  Abdominal: Soft. Bowel sounds are normal. He exhibits no distension. There is no tenderness.  +horizontal old surgical scar below umbilicus  Musculoskeletal: Normal range of motion.  Lymphadenopathy:    He has no cervical adenopathy.  Neurological: He is alert and oriented to person, place, and time. No sensory deficit. He exhibits normal muscle tone. Coordination normal.  Skin: Skin is warm and dry. Capillary refill takes less than 2 seconds. No rash noted.  Psychiatric: He has a normal mood and affect. His behavior is normal.  Vitals reviewed.    ASSESSMENT & PLAN: [name] was seen today for annual exam and medication refill.  Diagnoses and all orders for this visit:  Essential hypertension  Routine general medical examination at a health care facility -     CBC with Differential -     Comprehensive metabolic panel -     Hemoglobin A1c -     Lipid panel -     TSH -     PSA(Must document that pt has been informed of limitations of PSA testing.) -     Hepatitis C antibody screen -     HIV antibody  Other orders -     ALPRAZolam (XANAX) 1 MG tablet; Take 1 tablet (1 mg total) by mouth at bedtime as needed. -     lisinopril-hydrochlorothiazide (PRINZIDE,ZESTORETIC) 10-12.5 MG tablet; Take 1 tablet by mouth daily.    Patient Instructions       IF you received an x-ray today, you will receive an invoice from [facility]. Please contact [facility] at [phone] with questions or concerns regarding your invoice.   IF you received labwork today, you will receive an invoice from [facility]. Please contact [facility] at [phone] with questions or concerns regarding your invoice.   Our billing staff will not be able to assist you with questions regarding bills from these  companies.  You will be contacted with the lab results as soon as they are available. The fastest way to get your results is to activate your My Chart account. Instructions are located on the last page of this paperwork. If you have not heard from [place] regarding the results in 2 weeks, please contact this office.        Health Maintenance, Male A healthy lifestyle and preventive care is important for your health and wellness. Ask your health care provider about what schedule of regular examinations is right for you. What should I know about weight and diet? Eat a Healthy Diet  Eat plenty of vegetables, fruits, whole grains, low-fat dairy products, and lean protein.  Do not eat a lot of foods high in solid fats, added sugars, or salt.  Maintain a Healthy Weight Regular exercise can help you achieve or maintain a healthy weight. You should:  Do at least 150 minutes of exercise each week. The exercise should increase your heart rate and make you sweat (moderate-intensity exercise).  Do strength-training exercises at least twice a week.  Watch Your Levels of Cholesterol and Blood Lipids  Have your blood tested for lipids and cholesterol every 5 years starting at 64 years of age. If you are at high risk for heart disease, you should start having your blood tested when you are 64 years old. You may need to have your cholesterol levels checked more often if: ? Your lipid or cholesterol levels are high. ? You are older than 64 years of age. ? You are at high risk for heart disease.  What should I know about cancer screening? Many types of cancers can be detected early and may often be prevented. Lung Cancer  You should be screened every year for lung cancer if: ? You are a current smoker who has smoked for at least 30 years. ? You are a former smoker who has quit within the past 15 years.  Talk to your health care provider about your screening options, when you should start screening,  and how often you should be screened.  Colorectal Cancer  Routine colorectal cancer screening usually begins at 64 years of age and should be repeated every 5-10 years until you are 64 years old. You may need to be screened more often if early forms of precancerous polyps or small growths are found. Your health care provider may recommend screening at an earlier age if you have risk factors for colon cancer.  Your health care provider may recommend using home test kits to check for hidden blood in the stool.  A small camera at the end of a tube can be used to examine your colon (sigmoidoscopy or colonoscopy). This checks for the earliest forms of colorectal cancer.  Prostate and Testicular Cancer  Depending on your age and overall health, your health care provider may do certain tests to screen for prostate and testicular cancer.  Talk to your health care provider about any symptoms or concerns you have about testicular or prostate cancer.  Skin Cancer  Check your skin from head to toe regularly.  Tell your health care provider about any new moles or changes in moles, especially if: ? There is a change in a mole's size, shape, or color. ? You have a mole that is larger than a pencil eraser.  Always use sunscreen. Apply sunscreen liberally and repeat throughout the day.  Protect yourself by wearing long sleeves, pants, a wide-brimmed hat, and sunglasses when outside.  What should I know about heart disease, diabetes, and high blood pressure?  If you are 68-54 years of age, have your blood pressure checked every 3-5 years. If you are 28 years of age or older, have your blood pressure checked every year. You should have your blood pressure measured twice-once when you are at a hospital or clinic, and once when you are not at a hospital or clinic. Record the average of the two measurements. To check your blood pressure when you are not at a hospital or clinic, you can use: ? An automated  blood pressure machine at a pharmacy. ? A home blood pressure monitor.  Talk to your health care provider about your target  blood pressure.  If you are between 10-66 years old, ask your health care provider if you should take aspirin to prevent heart disease.  Have regular diabetes screenings by checking your fasting blood sugar level. ? If you are at a normal weight and have a low risk for diabetes, have this test once every three years after the age of 51. ? If you are overweight and have a high risk for diabetes, consider being tested at a younger age or more often.  A one-time screening for abdominal aortic aneurysm (AAA) by ultrasound is recommended for men aged 34-75 years who are current or former smokers. What should I know about preventing infection? Hepatitis B If you have a higher risk for hepatitis B, you should be screened for this virus. Talk with your health care provider to find out if you are at risk for hepatitis B infection. Hepatitis C Blood testing is recommended for:  Everyone born from [birth year] through 1963.  Anyone with known risk factors for hepatitis C.  Sexually Transmitted Diseases (STDs)  You should be screened each year for STDs including gonorrhea and chlamydia if: ? You are sexually active and are younger than 64 years of age. ? You are older than 64 years of age and your health care provider tells you that you are at risk for this type of infection. ? Your sexual activity has changed since you were last screened and you are at an increased risk for chlamydia or gonorrhea. Ask your health care provider if you are at risk.  Talk with your health care provider about whether you are at high risk of being infected with HIV. Your health care provider may recommend a prescription medicine to help prevent HIV infection.  What else can I do?  Schedule regular health, dental, and eye exams.  Stay current with your vaccines (immunizations).  Do not use any  tobacco products, such as cigarettes, chewing tobacco, and e-cigarettes. If you need help quitting, ask your health care provider.  Limit alcohol intake to no more than 2 drinks per day. One drink equals 12 ounces of beer, 5 ounces of wine, or 1 ounces of hard liquor.  Do not use street drugs.  Do not share needles.  Ask your health care provider for help if you need support or information about quitting drugs.  Tell your health care provider if you often feel depressed.  Tell your health care provider if you have ever been abused or do not feel safe at home. This information is not intended to replace advice given to you by your health care provider. Make sure you discuss any questions you have with your health care provider. Document Released: 11/23/2007 Document Revised: 01/24/2016 Document Reviewed: 02/28/2015 Elsevier Interactive Patient Education  2018 [place] (AHA) Exercise Recommendation  Being physically active is important to prevent heart disease and stroke, the nation's No. 1and No. 5killers. To improve overall cardiovascular health, we suggest at least 150 minutes per week of moderate exercise or 75 minutes per week of vigorous exercise (or a combination of moderate and vigorous activity). Thirty minutes a day, five times a week is an easy goal to remember. You will also experience benefits even if you divide your time into two or three segments of 10 to 15 minutes per day.  For people who would benefit from lowering their blood pressure or cholesterol, we recommend 40 minutes of aerobic exercise of moderate to vigorous intensity three to four times  a week to lower the risk for heart attack and stroke.  Physical activity is anything that makes you move your body and burn calories.  This includes things like climbing stairs or playing sports. Aerobic exercises benefit your heart, and include walking, jogging, swimming or biking. Strength and  stretching exercises are best for overall stamina and flexibility.  The simplest, positive change you can make to effectively improve your heart health is to start walking. It's enjoyable, free, easy, social and great exercise. A walking program is flexible and boasts high success rates because people can stick with it. It's easy for walking to become a regular and satisfying part of life.   For Overall Cardiovascular Health:  At least 30 minutes of moderate-intensity aerobic activity at least 5 days per week for a total of 150  OR   At least 25 minutes of vigorous aerobic activity at least 3 days per week for a total of 75 minutes; or a combination of moderate- and vigorous-intensity aerobic activity  AND   Moderate- to high-intensity muscle-strengthening activity at least 2 days per week for additional health benefits.  For Lowering Blood Pressure and Cholesterol  An average 40 minutes of moderate- to vigorous-intensity aerobic activity 3 or 4 times per week  What if I can't make it to the time goal? Something is always better than nothing! And everyone has to start somewhere. Even if you've been sedentary for years, today is the day you can begin to make healthy changes in your life. If you don't think you'll make it for 30 or 40 minutes, set a reachable goal for today. You can work up toward your overall goal by increasing your time as you get stronger. Don't let all-or-nothing thinking rob you of doing what you can every day.  Source:[email or web address].[name], MD [facility]

## 2017-01-09 NOTE — Patient Instructions (Addendum)
   IF you received an x-ray today, you will receive an invoice from [facility]. Please contact [facility] at [phone] with questions or concerns regarding your invoice.   IF you received labwork today, you will receive an invoice from [facility]. Please contact [facility] at [phone] with questions or concerns regarding your invoice.   Our billing staff will not be able to assist you with questions regarding bills from these companies.  You will be contacted with the lab results as soon as they are available. The fastest way to get your results is to activate your My Chart account. Instructions are located on the last page of this paperwork. If you have not heard from us regarding the results in 2 weeks, please contact this office.      Health Maintenance, Male A healthy lifestyle and preventive care is important for your health and wellness. Ask your health care provider about what schedule of regular examinations is right for you. What should I know about weight and diet? Eat a Healthy Diet  Eat plenty of vegetables, fruits, whole grains, low-fat dairy products, and lean protein.  Do not eat a lot of foods high in solid fats, added sugars, or salt.  Maintain a Healthy Weight Regular exercise can help you achieve or maintain a healthy weight. You should:  Do at least 150 minutes of exercise each week. The exercise should increase your heart rate and make you sweat (moderate-intensity exercise).  Do strength-training exercises at least twice a week.  Watch Your Levels of Cholesterol and Blood Lipids  Have your blood tested for lipids and cholesterol every 5 years starting at 64 years of age. If you are at high risk for heart disease, you should start having your blood tested when you are 64 years old. You may need to have your cholesterol levels checked more often if: ? Your lipid or cholesterol levels are high. ? You are older than 64 years of age. ? You  are at high risk for heart disease.  What should I know about cancer screening? Many types of cancers can be detected early and may often be prevented. Lung Cancer  You should be screened every year for lung cancer if: ? You are a current smoker who has smoked for at least 30 years. ? You are a former smoker who has quit within the past 15 years.  Talk to your health care provider about your screening options, when you should start screening, and how often you should be screened.  Colorectal Cancer  Routine colorectal cancer screening usually begins at 64 years of age and should be repeated every 5-10 years until you are 64 years old. You may need to be screened more often if early forms of precancerous polyps or small growths are found. Your health care provider may recommend screening at an earlier age if you have risk factors for colon cancer.  Your health care provider may recommend using home test kits to check for hidden blood in the stool.  A small camera at the end of a tube can be used to examine your colon (sigmoidoscopy or colonoscopy). This checks for the earliest forms of colorectal cancer.  Prostate and Testicular Cancer  Depending on your age and overall health, your health care provider may do certain tests to screen for prostate and testicular cancer.  Talk to your health care provider about any symptoms or concerns you have about testicular or prostate cancer.  Skin Cancer  Check your skin   your skin from head to toe regularly.  Tell your health care provider about any new moles or changes in moles, especially if: ? There is a change in a mole's size, shape, or color. ? You have a mole that is larger than a pencil eraser.  Always use sunscreen. Apply sunscreen liberally and repeat throughout the day.  Protect yourself by wearing long sleeves, pants, a wide-brimmed hat, and sunglasses when outside.  What should I know about heart disease, diabetes, and high  blood pressure?  If you are 7-68 years of age, have your blood pressure checked every 3-5 years. If you are 60 years of age or older, have your blood pressure checked every year. You should have your blood pressure measured twice-once when you are at a hospital or clinic, and once when you are not at a hospital or clinic. Record the average of the two measurements. To check your blood pressure when you are not at a hospital or clinic, you can use: ? An automated blood pressure machine at a pharmacy. ? A home blood pressure monitor.  Talk to your health care provider about your target blood pressure.  If you are between 34-21 years old, ask your health care provider if you should take aspirin to prevent heart disease.  Have regular diabetes screenings by checking your fasting blood sugar level. ? If you are at a normal weight and have a low risk for diabetes, have this test once every three years after the age of 28. ? If you are overweight and have a high risk for diabetes, consider being tested at a younger age or more often.  A one-time screening for abdominal aortic aneurysm (AAA) by ultrasound is recommended for men aged 58-75 years who are current or former smokers. What should I know about preventing infection? Hepatitis B If you have a higher risk for hepatitis B, you should be screened for this virus. Talk with your health care provider to find out if you are at risk for hepatitis B infection. Hepatitis C Blood testing is recommended for:  Everyone born from [birth year] through 1963.  Anyone with known risk factors for hepatitis C.  Sexually Transmitted Diseases (STDs)  You should be screened each year for STDs including gonorrhea and chlamydia if: ? You are sexually active and are younger than 64 years of age. ? You are older than 64 years of age and your health care provider tells you that you are at risk for this type of infection. ? Your sexual activity has changed since you were  last screened and you are at an increased risk for chlamydia or gonorrhea. Ask your health care provider if you are at risk.  Talk with your health care provider about whether you are at high risk of being infected with HIV. Your health care provider may recommend a prescription medicine to help prevent HIV infection.  What else can I do?  Schedule regular health, dental, and eye exams.  Stay current with your vaccines (immunizations).  Do not use any tobacco products, such as cigarettes, chewing tobacco, and e-cigarettes. If you need help quitting, ask your health care provider.  Limit alcohol intake to no more than 2 drinks per day. One drink equals 12 ounces of beer, 5 ounces of wine, or 1 ounces of hard liquor.  Do not use street drugs.  Do not share needles.  Ask your health care provider for help if you need support or information about quitting drugs.  Tell your  provider if you often feel depressed.  Tell your health care provider if you have ever been abused or do not feel safe at home. This information is not intended to replace advice given to you by your health care provider. Make sure you discuss any questions you have with your health care provider. Document Released: 11/23/2007 Document Revised: 01/24/2016 Document Reviewed: 02/28/2015 Elsevier Interactive Patient Education  2018 [employer] Inc.  American Heart Association (AHA) Exercise Recommendation  Being physically active is important to prevent heart disease and stroke, the nation's No. 1and No. 5killers. To improve overall cardiovascular health, we suggest at least 150 minutes per week of moderate exercise or 75 minutes per week of vigorous exercise (or a combination of moderate and vigorous activity). Thirty minutes a day, five times a week is an easy goal to remember. You will also experience benefits even if you divide your time into two or three segments of 10 to 15 minutes per day.  For people who would  benefit from lowering their blood pressure or cholesterol, we recommend 40 minutes of aerobic exercise of moderate to vigorous intensity three to four times a week to lower the risk for heart attack and stroke.  Physical activity is anything that makes you move your body and burn calories.  This includes things like climbing stairs or playing sports. Aerobic exercises benefit your heart, and include walking, jogging, swimming or biking. Strength and stretching exercises are best for overall stamina and flexibility.  The simplest, positive change you can make to effectively improve your heart health is to start walking. It's enjoyable, free, easy, social and great exercise. A walking program is flexible and boasts high success rates because people can stick with it. It's easy for walking to become a regular and satisfying part of life.   For Overall Cardiovascular Health:  At least 30 minutes of moderate-intensity aerobic activity at least 5 days per week for a total of 150  OR   At least 25 minutes of vigorous aerobic activity at least 3 days per week for a total of 75 minutes; or a combination of moderate- and vigorous-intensity aerobic activity  AND   Moderate- to high-intensity muscle-strengthening activity at least 2 days per week for additional health benefits.  For Lowering Blood Pressure and Cholesterol  An average 40 minutes of moderate- to vigorous-intensity aerobic activity 3 or 4 times per week  What if I can't make it to the time goal? Something is always better than nothing! And everyone has to start somewhere. Even if you've been sedentary for years, today is the day you can begin to make healthy changes in your life. If you don't think you'll make it for 30 or 40 minutes, set a reachable goal for today. You can work up toward your overall goal by increasing your time as you get stronger. Don't let all-or-nothing thinking rob you of doing what you can every day.   Source:http://www.heart.org    

## 2017-01-10 ENCOUNTER — Other Ambulatory Visit: Payer: Self-pay | Admitting: Emergency Medicine

## 2017-01-10 ENCOUNTER — Encounter: Payer: Self-pay | Admitting: Emergency Medicine

## 2017-01-10 DIAGNOSIS — R972 Elevated prostate specific antigen [PSA]: Secondary | ICD-10-CM

## 2017-01-10 LAB — COMPREHENSIVE METABOLIC PANEL
A/G RATIO: 1.5 (ref 1.2–2.2)
ALT: 25 IU/L (ref 0–44)
AST: 26 IU/L (ref 0–40)
Albumin: 4.3 g/dL (ref 3.6–4.8)
Alkaline Phosphatase: 57 IU/L (ref 39–117)
BILIRUBIN TOTAL: 0.3 mg/dL (ref 0.0–1.2)
BUN/Creatinine Ratio: 13 (ref 10–24)
BUN: 16 mg/dL (ref 8–27)
CO2: 23 mmol/L (ref 20–29)
CREATININE: 1.22 mg/dL (ref 0.76–1.27)
Calcium: 9.7 mg/dL (ref 8.6–10.2)
Chloride: 104 mmol/L (ref 96–106)
GFR, EST AFRICAN AMERICAN: 72 mL/min/{1.73_m2} (ref >59)
GFR, EST NON AFRICAN AMERICAN: 62 mL/min/{1.73_m2} (ref >59)
GLOBULIN, TOTAL: 2.9 g/dL (ref 1.5–4.5)
Glucose: 93 mg/dL (ref 65–99)
Potassium: 4.1 mmol/L (ref 3.5–5.2)
Sodium: 143 mmol/L (ref 134–144)
TOTAL PROTEIN: 7.2 g/dL (ref 6.0–8.5)

## 2017-01-10 LAB — LIPID PANEL
CHOLESTEROL TOTAL: 215 mg/dL — AB (ref 100–199)
Chol/HDL Ratio: 5.7 ratio — ABNORMAL HIGH (ref 0.0–5.0)
HDL: 38 mg/dL — AB (ref >39)
LDL CALC: 149 mg/dL — AB (ref 0–99)
Triglycerides: 140 mg/dL (ref 0–149)
VLDL CHOLESTEROL CAL: 28 mg/dL (ref 5–40)

## 2017-01-10 LAB — CBC WITH DIFFERENTIAL/PLATELET
BASOS ABS: 0 10*3/uL (ref 0.0–0.2)
Basos: 0 %
EOS (ABSOLUTE): 0.1 10*3/uL (ref 0.0–0.4)
Eos: 2 %
HEMATOCRIT: 40.8 % (ref 37.5–51.0)
Hemoglobin: 13.2 g/dL (ref 13.0–17.7)
IMMATURE GRANULOCYTES: 0 %
Immature Grans (Abs): 0 10*3/uL (ref 0.0–0.1)
LYMPHS ABS: 1.6 10*3/uL (ref 0.7–3.1)
Lymphs: 36 %
MCH: 26.5 pg — AB (ref 26.6–33.0)
MCHC: 32.4 g/dL (ref 31.5–35.7)
MCV: 82 fL (ref 79–97)
MONOS ABS: 0.3 10*3/uL (ref 0.1–0.9)
Monocytes: 6 %
NEUTROS PCT: 56 %
Neutrophils Absolute: 2.5 10*3/uL (ref 1.4–7.0)
PLATELETS: 223 10*3/uL (ref 150–379)
RBC: 4.98 x10E6/uL (ref 4.14–5.80)
RDW: 15.7 % — AB (ref 12.3–15.4)
WBC: 4.5 10*3/uL (ref 3.4–10.8)

## 2017-01-10 LAB — HIV ANTIBODY (ROUTINE TESTING W REFLEX): HIV Screen 4th Generation wRfx: NONREACTIVE

## 2017-01-10 LAB — PSA: Prostate Specific Ag, Serum: 11.1 ng/mL — ABNORMAL HIGH (ref 0.0–4.0)

## 2017-01-10 LAB — HEPATITIS C ANTIBODY: Hep C Virus Ab: 0.2 s/co ratio (ref 0.0–0.9)

## 2017-01-10 LAB — HEMOGLOBIN A1C
ESTIMATED AVERAGE GLUCOSE: 134 mg/dL
Hgb A1c MFr Bld: 6.3 % — ABNORMAL HIGH (ref 4.8–5.6)

## 2017-01-10 LAB — TSH: TSH: 1.83 u[IU]/mL (ref 0.450–4.500)

## 2017-04-25 ENCOUNTER — Emergency Department (HOSPITAL_COMMUNITY): Payer: Managed Care, Other (non HMO)

## 2017-04-25 ENCOUNTER — Emergency Department (HOSPITAL_COMMUNITY)
Admission: EM | Admit: 2017-04-25 | Discharge: 2017-04-25 | Disposition: A | Discharge status: Home / Self Care | Payer: Managed Care, Other (non HMO) | Attending: Emergency Medicine | Admitting: Emergency Medicine

## 2017-04-25 ENCOUNTER — Encounter (HOSPITAL_COMMUNITY): Payer: Self-pay | Admitting: Emergency Medicine

## 2017-04-25 DIAGNOSIS — M542 Cervicalgia: Secondary | ICD-10-CM | POA: Insufficient documentation

## 2017-04-25 DIAGNOSIS — I10 Essential (primary) hypertension: Secondary | ICD-10-CM | POA: Diagnosis not present

## 2017-04-25 DIAGNOSIS — Y9389 Activity, other specified: Secondary | ICD-10-CM | POA: Insufficient documentation

## 2017-04-25 DIAGNOSIS — Y998 Other external cause status: Secondary | ICD-10-CM | POA: Diagnosis not present

## 2017-04-25 DIAGNOSIS — F419 Anxiety disorder, unspecified: Secondary | ICD-10-CM | POA: Insufficient documentation

## 2017-04-25 DIAGNOSIS — Y92410 Unspecified street and highway as the place of occurrence of the external cause: Secondary | ICD-10-CM | POA: Insufficient documentation

## 2017-04-25 DIAGNOSIS — M545 Low back pain: Secondary | ICD-10-CM | POA: Insufficient documentation

## 2017-04-25 DIAGNOSIS — Z79899 Other long term (current) drug therapy: Secondary | ICD-10-CM | POA: Diagnosis not present

## 2017-04-25 MED ORDER — HYDROCODONE-ACETAMINOPHEN 5-325 MG PO TABS
1.0000 | ORAL_TABLET | Freq: Once | ORAL | Status: AC
Start: 2017-04-25 — End: 2017-04-25
  Administered 2017-04-25: 1 via ORAL
  Filled 2017-04-25: qty 1

## 2017-04-25 MED ORDER — LIDOCAINE 5 % EX PTCH: 1.0000 | MEDICATED_PATCH | CUTANEOUS | 0 refills | Status: DC

## 2017-04-25 MED ORDER — IBUPROFEN 600 MG PO TABS: 600.0000 mg | ORAL_TABLET | Freq: Four times a day (QID) | ORAL | 0 refills | Status: DC | PRN

## 2017-04-25 MED ORDER — METHOCARBAMOL 500 MG PO TABS
1000.0000 mg | ORAL_TABLET | Freq: Once | ORAL | Status: AC
  Administered 2017-04-25: 1000 mg via ORAL
  Filled 2017-04-25: qty 2

## 2017-04-25 MED ORDER — METHOCARBAMOL 500 MG PO TABS
500.0000 mg | ORAL_TABLET | Freq: Two times a day (BID) | ORAL | 0 refills | Status: DC
Start: 2017-04-25 — End: 2020-01-10

## 2017-04-25 MED ORDER — IBUPROFEN 400 MG PO TABS
600.0000 mg | ORAL_TABLET | Freq: Once | ORAL | Status: AC
  Administered 2017-04-25: 10:00:00 600 mg via ORAL
  Filled 2017-04-25: qty 1
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
## 2017-04-25 NOTE — Discharge Instructions (Signed)
Expect your soreness to increase over the next 2-3 days. Take it easy, but do not lay around too much as this may make any stiffness worse.  °Antiinflammatory medications: Take 600 mg of ibuprofen every 6 hours or 440 mg (over the counter dose) to 500 mg (prescription dose) of naproxen every 12 hours for the next 3 days. After this time, these medications may be used as needed for pain. Take these medications with food to avoid upset stomach. Choose only one of these medications, do not take them together.  °Tylenol: Should you continue to have additional pain while taking the ibuprofen or naproxen, you may add in tylenol as needed. Your daily total maximum amount of tylenol from all sources should be limited to 4000mg/day for persons without liver problems, or 2000mg/day for those with liver problems. °Muscle relaxer: Robaxin is a muscle relaxer and may help loosen stiff muscles. Do not take the Robaxin while driving or performing other dangerous activities.  °Lidocaine patches: These are available via either prescription or over-the-counter. The over-the-counter option may be more economical one and are likely just as effective. There are multiple over-the-counter brands, such as Salonpas. °Exercises: Be sure to perform the attached exercises starting with three times a week and working up to performing them daily. This is an essential part of preventing long term problems.  ° °Follow up with a primary care provider for any future management of these complaints. °
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
## 2017-07-01 ENCOUNTER — Other Ambulatory Visit: Payer: Self-pay

## 2017-07-01 ENCOUNTER — Ambulatory Visit (INDEPENDENT_AMBULATORY_CARE_PROVIDER_SITE_OTHER): Payer: 59 | Admitting: Emergency Medicine

## 2017-07-01 ENCOUNTER — Encounter: Payer: Self-pay | Admitting: Emergency Medicine

## 2017-07-01 VITALS — BP 122/72 | HR 78 | Temp 98.2°F | Resp 16 | Ht 75.0 in | Wt 311.2 lb

## 2017-07-01 DIAGNOSIS — J22 Unspecified acute lower respiratory infection: Secondary | ICD-10-CM | POA: Diagnosis not present

## 2017-07-01 DIAGNOSIS — R05 Cough: Secondary | ICD-10-CM | POA: Diagnosis not present

## 2017-07-01 DIAGNOSIS — R059 Cough, unspecified: Secondary | ICD-10-CM

## 2017-07-01 MED ORDER — PROMETHAZINE-CODEINE 6.25-10 MG/5ML PO SYRP
5.0000 mL | ORAL_SOLUTION | Freq: Every evening | ORAL | 0 refills | Status: DC | PRN
Start: 2017-07-01 — End: 2017-08-29

## 2017-07-01 MED ORDER — AMOXICILLIN-POT CLAVULANATE 875-125 MG PO TABS: 1.0000 | ORAL_TABLET | Freq: Two times a day (BID) | ORAL | 0 refills | Status: AC

## 2017-07-01 NOTE — Patient Instructions (Addendum)
     IF you received an x-ray today, you will receive an invoice from [facility]. Please contact [facility] at [phone] with questions or concerns regarding your invoice.   IF you received labwork today, you will receive an invoice from [facility]. Please contact [facility] at [phone] with questions or concerns regarding your invoice.   Our billing staff will not be able to assist you with questions regarding bills from these companies.  You will be contacted with the lab results as soon as they are available. The fastest way to get your results is to activate your My Chart account. Instructions are located on the last page of this paperwork. If you have not heard from us regarding the results in 2 weeks, please contact this office.     Acute Bronchitis, Adult Acute bronchitis is when air tubes (bronchi) in the lungs suddenly get swollen. The condition can make it hard to breathe. It can also cause these symptoms:  A cough.  Coughing up clear, yellow, or green mucus.  Wheezing.  Chest congestion.  Shortness of breath.  A fever.  Body aches.  Chills.  A sore throat.  Follow these instructions at home: Medicines  Take over-the-counter and prescription medicines only as told by your doctor.  If you were prescribed an antibiotic medicine, take it as told by your doctor. Do not stop taking the antibiotic even if you start to feel better. General instructions  Rest.  Drink enough fluids to keep your pee (urine) clear or pale yellow.  Avoid smoking and secondhand smoke. If you smoke and you need help quitting, ask your doctor. Quitting will help your lungs heal faster.  Use an inhaler, cool mist vaporizer, or humidifier as told by your doctor.  Keep all follow-up visits as told by your doctor. This is important. How is this prevented? To lower your risk of getting this condition again:  Wash your hands often with soap and water. If you cannot  use soap and water, use hand sanitizer.  Avoid contact with people who have cold symptoms.  Try not to touch your hands to your mouth, nose, or eyes.  Make sure to get the flu shot every year.  Contact a doctor if:  Your symptoms do not get better in 2 weeks. Get help right away if:  You cough up blood.  You have chest pain.  You have very bad shortness of breath.  You become dehydrated.  You faint (pass out) or keep feeling like you are going to pass out.  You keep throwing up (vomiting).  You have a very bad headache.  Your fever or chills gets worse. This information is not intended to replace advice given to you by your health care provider. Make sure you discuss any questions you have with your health care provider. Document Released: 11/13/2007 Document Revised: 01/03/2016 Document Reviewed: 11/15/2015 Elsevier Interactive Patient Education  2018 [employer] Inc.  

## 2017-07-01 NOTE — Progress Notes (Signed)
[name] [name] 67 y.o.   Chief Complaint  Patient presents with  . Cough    x 2 days productive with brown mucus    HISTORY OF PRESENT ILLNESS: This is a 67 y.o. male complaining of productive cough x 3-4 days.  Cough  This is a new problem. The current episode started in the past 7 days. The problem has been gradually worsening. The problem occurs every few minutes. The cough is productive of sputum. Associated symptoms include nasal congestion. Pertinent negatives include no chest pain, chills, fever, headaches, myalgias, rash, sore throat, shortness of breath, weight loss or wheezing. Risk factors: none. There is no history of asthma, COPD, emphysema or pneumonia.     Prior to Admission medications   Medication Sig Start Date End Date Taking? Authorizing Provider  ALPRAZolam Duanne Moron) 1 MG tablet Take 1 tablet (1 mg total) by mouth at bedtime as needed. 01/09/17  Yes Horald Pollen, MD  b complex vitamins tablet Take 1 tablet by mouth daily.     Yes [provider]  cholecalciferol (VITAMIN D) 1000 UNITS tablet Take 1,000 Units by mouth daily.     Yes [provider]  fish oil-omega-3 fatty acids 1000 MG capsule Take 1 g by mouth daily.     Yes [provider]  lisinopril-hydrochlorothiazide (PRINZIDE,ZESTORETIC) 10-12.5 MG tablet Take 1 tablet by mouth daily. 01/09/17  Yes SagardiaInes Bloomer, MD  Multiple Vitamin (MULTIVITAMIN) tablet Take 1 tablet by mouth daily.     Yes [provider]  ibuprofen (ADVIL,MOTRIN) 600 MG tablet Take 1 tablet (600 mg total) every 6 (six) hours as needed by mouth. Patient not taking: Reported on 07/01/2017 04/25/17   Joy, Shawn C, PA-C  lidocaine (LIDODERM) 5 % Place 1 patch daily onto the skin. Remove & Discard patch within 12 hours or as directed by MD Patient not taking: Reported on 07/01/2017 04/25/17   Joy, Helane Gunther, PA-C  methocarbamol (ROBAXIN) 500 MG tablet Take 1 tablet (500 mg total) 2 (two) times daily  by mouth. Patient not taking: Reported on 07/01/2017 04/25/17   Lorayne Bender, PA-C    Allergies  Allergen Reactions  . Aspirin     REACTION: upsets stomach  . Statins     REACTION: UPSETS STOMACH    Patient Active Problem List   Diagnosis Date Noted  . Gout 10/25/2014  . Hypertension   . BPH (benign prostatic hypertrophy)   . Kidney stones   . Prostate nodule     Past Medical History:  Diagnosis Date  . Allergy   . Anxiety   . Arthritis   . BPH (benign prostatic hypertrophy)   . Colon polyp    diverticular bleed with transfusion  . Hyperlipidemia   . Hypertension   . Kidney stones    15-20  . Obesity, unspecified   . Prostate nodule [id number]    Past Surgical History:  Procedure Laterality Date  . ANKLE FUSION    . HERNIA REPAIR    . JOINT REPLACEMENT    . TRANSURETHRAL RESECTION OF PROSTATE      Social History   Socioeconomic History  . Marital status: Married    Spouse name: Not on file  . Number of children: 2  . Years of education: Not on file  . Highest education level: Not on file  Social Needs  . Financial resource strain: Not on file  . Food insecurity - worry: Not on file  . Food insecurity - inability: Not  on file  . Transportation needs - medical: Not on file  . Transportation needs - non-medical: Not on file  Occupational History  . Not on file  Tobacco Use  . Smoking status: Never Smoker  . Smokeless tobacco: Never Used  Substance and Sexual Activity  . Alcohol use: No  . Drug use: No  . Sexual activity: No  Other Topics Concern  . Not on file  Social History Narrative  . Not on file    Family History  Problem Relation Age of Onset  . Diabetes Mother   . Prostate cancer Unknown   . Diabetes Unknown      Review of Systems  Constitutional: Negative.  Negative for chills, fever and weight loss.  HENT: Negative.  Negative for sinus pain and sore throat.   Eyes: Negative.   Respiratory: Positive for cough. Negative for  shortness of breath and wheezing.   Cardiovascular: Negative for chest pain and palpitations.  Gastrointestinal: Negative for abdominal pain, nausea and vomiting.  Genitourinary: Negative for dysuria and hematuria.  Musculoskeletal: Negative for back pain, joint pain and myalgias.  Skin: Negative for rash.  Neurological: Negative for dizziness and headaches.  Endo/Heme/Allergies: Negative.   All other systems reviewed and are negative.   Vitals:   07/01/17 1202  BP: 122/72  Pulse: 78  Resp: 16  Temp: 98.2 F (36.8 C)  SpO2: 99%    Physical Exam  Constitutional: He is oriented to person, place, and time. He appears well-developed and well-nourished.  HENT:  Head: Normocephalic and atraumatic.  Nose: Nose normal.  Mouth/Throat: Oropharynx is clear and moist.  Eyes: Conjunctivae and EOM are normal. Pupils are equal, round, and reactive to light.  Neck: Normal range of motion. Neck supple. No JVD present. No thyromegaly present.  Cardiovascular: Normal rate, regular rhythm and normal heart sounds.  Pulmonary/Chest: Effort normal and breath sounds normal. No respiratory distress.  Abdominal: Soft. Bowel sounds are normal. He exhibits no distension. There is no tenderness.  Musculoskeletal: Normal range of motion. He exhibits no edema.  Lymphadenopathy:    He has no cervical adenopathy.  Neurological: He is alert and oriented to person, place, and time. No sensory deficit. He exhibits normal muscle tone.  Skin: Skin is warm and dry. Capillary refill takes less than 2 seconds. No rash noted.  Psychiatric: He has a normal mood and affect. His behavior is normal.  Vitals reviewed.   A total of 25 minutes was spent in the room with the patient, greater than 50% of which was in counseling/coordination of care.   ASSESSMENT & PLAN: Elhadji was seen today for cough.  Diagnoses and all orders for this visit:  Cough -     promethazine-codeine (PHENERGAN WITH CODEINE) 6.25-10 MG/5ML  syrup; Take 5 mLs by mouth at bedtime as needed for cough.  Lower respiratory infection -     amoxicillin-clavulanate (AUGMENTIN) 875-125 MG tablet; Take 1 tablet by mouth 2 (two) times daily for 7 days.    Patient Instructions       IF you received an x-ray today, you will receive an invoice from [facility]. Please contact [facility] at [phone] with questions or concerns regarding your invoice.   IF you received labwork today, you will receive an invoice from [facility]. Please contact [facility] at [phone] with questions or concerns regarding your invoice.   Our billing staff will not be able to assist you with questions regarding bills from these companies.  You will be contacted with  the lab results as soon as they are available. The fastest way to get your results is to activate your My Chart account. Instructions are located on the last page of this paperwork. If you have not heard from [place] regarding the results in 2 weeks, please contact this office.     Acute Bronchitis, Adult Acute bronchitis is when air tubes (bronchi) in the lungs suddenly get swollen. The condition can make it hard to breathe. It can also cause these symptoms:  A cough.  Coughing up clear, yellow, or green mucus.  Wheezing.  Chest congestion.  Shortness of breath.  A fever.  Body aches.  Chills.  A sore throat.  Follow these instructions at home: Medicines  Take over-the-counter and prescription medicines only as told by your doctor.  If you were prescribed an antibiotic medicine, take it as told by your doctor. Do not stop taking the antibiotic even if you start to feel better. General instructions  Rest.  Drink enough fluids to keep your pee (urine) clear or pale yellow.  Avoid smoking and secondhand smoke. If you smoke and you need help quitting, ask your doctor. Quitting will help your lungs heal faster.  Use an inhaler, cool mist vaporizer, or  humidifier as told by your doctor.  Keep all follow-up visits as told by your doctor. This is important. How is this prevented? To lower your risk of getting this condition again:  Wash your hands often with soap and water. If you cannot use soap and water, use hand sanitizer.  Avoid contact with people who have cold symptoms.  Try not to touch your hands to your mouth, nose, or eyes.  Make sure to get the flu shot every year.  Contact a doctor if:  Your symptoms do not get better in 2 weeks. Get help right away if:  You cough up blood.  You have chest pain.  You have very bad shortness of breath.  You become dehydrated.  You faint (pass out) or keep feeling like you are going to pass out.  You keep throwing up (vomiting).  You have a very bad headache.  Your fever or chills gets worse. This information is not intended to replace advice given to you by your health care provider. Make sure you discuss any questions you have with your health care provider. Document Released: 11/13/2007 Document Revised: 01/03/2016 Document Reviewed: 11/15/2015 Elsevier Interactive Patient Education  2018 [employer].      [name], MD [facility]

## 2017-07-18 ENCOUNTER — Ambulatory Visit: Payer: Managed Care, Other (non HMO) | Admitting: Emergency Medicine

## 2017-07-28 ENCOUNTER — Other Ambulatory Visit: Payer: Self-pay | Admitting: Urology

## 2017-07-28 DIAGNOSIS — C61 Malignant neoplasm of prostate: Secondary | ICD-10-CM

## 2017-08-12 ENCOUNTER — Other Ambulatory Visit: Payer: Managed Care, Other (non HMO)

## 2017-08-29 ENCOUNTER — Encounter: Payer: Self-pay | Admitting: Emergency Medicine

## 2017-08-29 ENCOUNTER — Ambulatory Visit (INDEPENDENT_AMBULATORY_CARE_PROVIDER_SITE_OTHER): Payer: 59 | Admitting: Emergency Medicine

## 2017-08-29 VITALS — BP 147/87 | HR 70 | Temp 98.5°F | Resp 17 | Ht 74.5 in | Wt 312.0 lb

## 2017-08-29 DIAGNOSIS — I10 Essential (primary) hypertension: Secondary | ICD-10-CM | POA: Diagnosis not present

## 2017-08-29 DIAGNOSIS — N400 Enlarged prostate without lower urinary tract symptoms: Secondary | ICD-10-CM | POA: Diagnosis not present

## 2017-08-29 DIAGNOSIS — R972 Elevated prostate specific antigen [PSA]: Secondary | ICD-10-CM | POA: Diagnosis not present

## 2017-08-29 NOTE — Patient Instructions (Addendum)
   IF you received an x-ray today, you will receive an invoice from [facility]. Please contact [facility] at [phone] with questions or concerns regarding your invoice.   IF you received labwork today, you will receive an invoice from [facility]. Please contact [facility] at [phone] with questions or concerns regarding your invoice.   Our billing staff will not be able to assist you with questions regarding bills from these companies.  You will be contacted with the lab results as soon as they are available. The fastest way to get your results is to activate your My Chart account. Instructions are located on the last page of this paperwork. If you have not heard from us regarding the results in 2 weeks, please contact this office.     Hypertension Hypertension, commonly called high blood pressure, is when the force of blood pumping through the arteries is too strong. The arteries are the blood vessels that carry blood from the heart throughout the body. Hypertension forces the heart to work harder to pump blood and may cause arteries to become narrow or stiff. Having untreated or uncontrolled hypertension can cause heart attacks, strokes, kidney disease, and other problems. A blood pressure reading consists of a higher number over a lower number. Ideally, your blood pressure should be below 120/80. The first ("top") number is called the systolic pressure. It is a measure of the pressure in your arteries as your heart beats. The second ("bottom") number is called the diastolic pressure. It is a measure of the pressure in your arteries as the heart relaxes. What are the causes? The cause of this condition is not known. What increases the risk? Some risk factors for high blood pressure are under your control. Others are not. Factors you can change  Smoking.  Having type 2 diabetes mellitus, high cholesterol, or both.  Not getting enough exercise or physical  activity.  Being overweight.  Having too much fat, sugar, calories, or salt (sodium) in your diet.  Drinking too much alcohol. Factors that are difficult or impossible to change  Having chronic kidney disease.  Having a family history of high blood pressure.  Age. Risk increases with age.  Race. You may be at higher risk if you are African-American.  Gender. Men are at higher risk than women before age 45. After age 65, women are at higher risk than men.  Having obstructive sleep apnea.  Stress. What are the signs or symptoms? Extremely high blood pressure (hypertensive crisis) may cause:  Headache.  Anxiety.  Shortness of breath.  Nosebleed.  Nausea and vomiting.  Severe chest pain.  Jerky movements you cannot control (seizures).  How is this diagnosed? This condition is diagnosed by measuring your blood pressure while you are seated, with your arm resting on a surface. The cuff of the blood pressure monitor will be placed directly against the skin of your upper arm at the level of your heart. It should be measured at least twice using the same arm. Certain conditions can cause a difference in blood pressure between your right and left arms. Certain factors can cause blood pressure readings to be lower or higher than normal (elevated) for a short period of time:  When your blood pressure is higher when you are in a health care provider's office than when you are at home, this is called white coat hypertension. Most people with this condition do not need medicines.  When your blood pressure is higher at home than when you   are in a health care provider's office, this is called masked hypertension. Most people with this condition may need medicines to control blood pressure.  If you have a high blood pressure reading during one visit or you have normal blood pressure with other risk factors:  You may be asked to return on a different day to have your blood pressure  checked again.  You may be asked to monitor your blood pressure at home for 1 week or longer.  If you are diagnosed with hypertension, you may have other blood or imaging tests to help your health care provider understand your overall risk for other conditions. How is this treated? This condition is treated by making healthy lifestyle changes, such as eating healthy foods, exercising more, and reducing your alcohol intake. Your health care provider may prescribe medicine if lifestyle changes are not enough to get your blood pressure under control, and if:  Your systolic blood pressure is above 130.  Your diastolic blood pressure is above 80.  Your personal target blood pressure may vary depending on your medical conditions, your age, and other factors. Follow these instructions at home: Eating and drinking  Eat a diet that is high in fiber and potassium, and low in sodium, added sugar, and fat. An example eating plan is called the DASH (Dietary Approaches to Stop Hypertension) diet. To eat this way: ? Eat plenty of fresh fruits and vegetables. Try to fill half of your plate at each meal with fruits and vegetables. ? Eat whole grains, such as whole wheat pasta, brown rice, or whole grain bread. Fill about one quarter of your plate with whole grains. ? Eat or drink low-fat dairy products, such as skim milk or low-fat yogurt. ? Avoid fatty cuts of meat, processed or cured meats, and poultry with skin. Fill about one quarter of your plate with lean proteins, such as fish, chicken without skin, beans, eggs, and tofu. ? Avoid premade and processed foods. These tend to be higher in sodium, added sugar, and fat.  Reduce your daily sodium intake. Most people with hypertension should eat less than 1,500 mg of sodium a day.  Limit alcohol intake to no more than 1 drink a day for nonpregnant women and 2 drinks a day for men. One drink equals 12 oz of beer, 5 oz of wine, or 1 oz of hard  liquor. Lifestyle  Work with your health care provider to maintain a healthy body weight or to lose weight. Ask what an ideal weight is for you.  Get at least 30 minutes of exercise that causes your heart to beat faster (aerobic exercise) most days of the week. Activities may include walking, swimming, or biking.  Include exercise to strengthen your muscles (resistance exercise), such as pilates or lifting weights, as part of your weekly exercise routine. Try to do these types of exercises for 30 minutes at least 3 days a week.  Do not use any products that contain nicotine or tobacco, such as cigarettes and e-cigarettes. If you need help quitting, ask your health care provider.  Monitor your blood pressure at home as told by your health care provider.  Keep all follow-up visits as told by your health care provider. This is important. Medicines  Take over-the-counter and prescription medicines only as told by your health care provider. Follow directions carefully. Blood pressure medicines must be taken as prescribed.  Do not skip doses of blood pressure medicine. Doing this puts you at risk for problems and   can make the medicine less effective.  Ask your health care provider about side effects or reactions to medicines that you should watch for. Contact a health care provider if:  You think you are having a reaction to a medicine you are taking.  You have headaches that keep coming back (recurring).  You feel dizzy.  You have swelling in your ankles.  You have trouble with your vision. Get help right away if:  You develop a severe headache or confusion.  You have unusual weakness or numbness.  You feel faint.  You have severe pain in your chest or abdomen.  You vomit repeatedly.  You have trouble breathing. Summary  Hypertension is when the force of blood pumping through your arteries is too strong. If this condition is not controlled, it may put you at risk for serious  complications.  Your personal target blood pressure may vary depending on your medical conditions, your age, and other factors. For most people, a normal blood pressure is less than 120/80.  Hypertension is treated with lifestyle changes, medicines, or a combination of both. Lifestyle changes include weight loss, eating a healthy, low-sodium diet, exercising more, and limiting alcohol. This information is not intended to replace advice given to you by your health care provider. Make sure you discuss any questions you have with your health care provider. Document Released: 05/27/2005 Document Revised: 04/24/2016 Document Reviewed: 04/24/2016 Elsevier Interactive Patient Education  2018 [employer]  

## 2017-08-29 NOTE — Progress Notes (Signed)
[name] [name] 67 y.o.   Chief Complaint  Patient presents with  . Follow-up    hbp     HISTORY OF PRESENT ILLNESS: This is a 67 y.o. male with a history of hypertension here for follow-up.  Compliant with medication.  He takes lisinopril/hydrochlorothiazide daily.  Doing well.  Has no complaints.  Home blood pressure readings have been okay, highest being 143/79. Labs reviewed shows increased PSA 11.1 done on 01/09/2017.  Has seen the urologist.  Has enlarged prostate.  Had a biopsy done.  2 spots showed cancer cells.  Scheduled for MRI. No other active problems at the present time.  HPI   Prior to Admission medications   Medication Sig Start Date End Date Taking? Authorizing Provider  ALPRAZolam Duanne Moron) 1 MG tablet Take 1 tablet (1 mg total) by mouth at bedtime as needed. 01/09/17  Yes Horald Pollen, MD  b complex vitamins tablet Take 1 tablet by mouth daily.     Yes [provider]  cholecalciferol (VITAMIN D) 1000 UNITS tablet Take 1,000 Units by mouth daily.     Yes [provider]  fish oil-omega-3 fatty acids 1000 MG capsule Take 1 g by mouth daily.     Yes [provider]  lisinopril-hydrochlorothiazide (PRINZIDE,ZESTORETIC) 10-12.5 MG tablet Take 1 tablet by mouth daily. 01/09/17  Yes Tivon Lemoine, Ines Bloomer, MD  methocarbamol (ROBAXIN) 500 MG tablet Take 1 tablet (500 mg total) 2 (two) times daily by mouth. 04/25/17  Yes Joy, Shawn C, PA-C  Multiple Vitamin (MULTIVITAMIN) tablet Take 1 tablet by mouth daily.     Yes [provider]    Allergies  Allergen Reactions  . Aspirin     REACTION: upsets stomach  . Statins     REACTION: UPSETS STOMACH    Patient Active Problem List   Diagnosis Date Noted  . Lower respiratory infection 07/01/2017  . Gout 10/25/2014  . Cough 02/18/2012  . Hypertension   . BPH (benign prostatic hypertrophy)   . Kidney stones   . Prostate nodule     Past Medical History:  Diagnosis Date  . Allergy     . Anxiety   . Arthritis   . BPH (benign prostatic hypertrophy)   . Colon polyp    diverticular bleed with transfusion  . Hyperlipidemia   . Hypertension   . Kidney stones    15-20  . Obesity, unspecified   . Prostate nodule [id number]    Past Surgical History:  Procedure Laterality Date  . ANKLE FUSION    . HERNIA REPAIR    . JOINT REPLACEMENT    . TRANSURETHRAL RESECTION OF PROSTATE      Social History   Socioeconomic History  . Marital status: Married    Spouse name: Not on file  . Number of children: 2  . Years of education: Not on file  . Highest education level: Not on file  Occupational History  . Not on file  Social Needs  . Financial resource strain: Not on file  . Food insecurity:    Worry: Not on file    Inability: Not on file  . Transportation needs:    Medical: Not on file    Non-medical: Not on file  Tobacco Use  . Smoking status: Never Smoker  . Smokeless tobacco: Never Used  Substance and Sexual Activity  . Alcohol use: No  . Drug use: No  . Sexual activity: Never  Lifestyle  . Physical activity:    Days per  week: Not on file    Minutes per session: Not on file  . Stress: Not on file  Relationships  . Social connections:    Talks on phone: Not on file    Gets together: Not on file    Attends religious service: Not on file    Active member of club or organization: Not on file    Attends meetings of clubs or organizations: Not on file    Relationship status: Not on file  . Intimate partner violence:    Fear of current or ex partner: Not on file    Emotionally abused: Not on file    Physically abused: Not on file    Forced sexual activity: Not on file  Other Topics Concern  . Not on file  Social History Narrative  . Not on file    Family History  Problem Relation Age of Onset  . Diabetes Mother   . Prostate cancer Unknown   . Diabetes Unknown      Review of Systems  Constitutional: Negative.  Negative for chills, fever,  malaise/fatigue and weight loss.  HENT: Negative.  Negative for congestion, nosebleeds and sore throat.   Eyes: Negative.  Negative for blurred vision and double vision.  Respiratory: Negative for cough, hemoptysis and shortness of breath.   Cardiovascular: Negative.  Negative for chest pain, palpitations and leg swelling.  Gastrointestinal: Negative.  Negative for abdominal pain, diarrhea, nausea and vomiting.  Genitourinary: Negative.  Negative for dysuria and hematuria.  Musculoskeletal: Negative.  Negative for back pain, myalgias and neck pain.  Skin: Negative.  Negative for rash.  Neurological: Negative.  Negative for dizziness, sensory change, focal weakness and headaches.  Endo/Heme/Allergies: Negative.   All other systems reviewed and are negative.   Vitals:   08/29/17 0953  BP: (!) 147/87  Pulse: 70  Resp: 17  Temp: 98.5 F (36.9 C)  SpO2: 98%    Physical Exam  Constitutional: He is oriented to person, place, and time. He appears well-developed and well-nourished.  HENT:  Head: Normocephalic and atraumatic.  Right Ear: External ear normal.  Left Ear: External ear normal.  Nose: Nose normal.  Mouth/Throat: Oropharynx is clear and moist.  Eyes: Pupils are equal, round, and reactive to light. Conjunctivae and EOM are normal.  Neck: Normal range of motion. Neck supple. No JVD present. No thyromegaly present.  Cardiovascular: Normal rate, regular rhythm and normal heart sounds.  Pulmonary/Chest: Effort normal and breath sounds normal. No respiratory distress.  Abdominal: Soft. Bowel sounds are normal. He exhibits no distension. There is no tenderness.  Musculoskeletal: Normal range of motion.  Lymphadenopathy:    He has no cervical adenopathy.  Neurological: He is alert and oriented to person, place, and time. No sensory deficit. He exhibits normal muscle tone.  Skin: Skin is warm and dry. Capillary refill takes less than 2 seconds. No rash noted.  Psychiatric: He has a  normal mood and affect. His behavior is normal.  Vitals reviewed.  A total of 25 minutes was spent in the room with the patient, greater than 50% of which was in counseling/coordination of care.  Hypertension Fairly well controlled.  Encouraged to continue in home blood pressure monitoring.  Continue present medication.  Follow-up in 6 months.  Elevated PSA Presently under the care of urology for prostate cancer workup.      ASSESSMENT & PLAN: Seyed was seen today for follow-up.  Diagnoses and all orders for this visit:  Essential hypertension  Elevated PSA  Prostate enlargement    Patient Instructions       IF you received an x-ray today, you will receive an invoice from [facility]. Please contact [facility] at [phone] with questions or concerns regarding your invoice.   IF you received labwork today, you will receive an invoice from [facility]. Please contact [facility] at [phone] with questions or concerns regarding your invoice.   Our billing staff will not be able to assist you with questions regarding bills from these companies.  You will be contacted with the lab results as soon as they are available. The fastest way to get your results is to activate your My Chart account. Instructions are located on the last page of this paperwork. If you have not heard from [place] regarding the results in 2 weeks, please contact this office.      Hypertension Hypertension, commonly called high blood pressure, is when the force of blood pumping through the arteries is too strong. The arteries are the blood vessels that carry blood from the heart throughout the body. Hypertension forces the heart to work harder to pump blood and may cause arteries to become narrow or stiff. Having untreated or uncontrolled hypertension can cause heart attacks, strokes, kidney disease, and other problems. A blood pressure reading consists of a higher number over a lower  number. Ideally, your blood pressure should be below 120/80. The first ("top") number is called the systolic pressure. It is a measure of the pressure in your arteries as your heart beats. The second ("bottom") number is called the diastolic pressure. It is a measure of the pressure in your arteries as the heart relaxes. What are the causes? The cause of this condition is not known. What increases the risk? Some risk factors for high blood pressure are under your control. Others are not. Factors you can change  Smoking.  Having type 2 diabetes mellitus, high cholesterol, or both.  Not getting enough exercise or physical activity.  Being overweight.  Having too much fat, sugar, calories, or salt (sodium) in your diet.  Drinking too much alcohol. Factors that are difficult or impossible to change  Having chronic kidney disease.  Having a family history of high blood pressure.  Age. Risk increases with age.  Race. You may be at higher risk if you are African-American.  Gender. Men are at higher risk than women before age 33. After age 20, women are at higher risk than men.  Having obstructive sleep apnea.  Stress. What are the signs or symptoms? Extremely high blood pressure (hypertensive crisis) may cause:  Headache.  Anxiety.  Shortness of breath.  Nosebleed.  Nausea and vomiting.  Severe chest pain.  Jerky movements you cannot control (seizures).  How is this diagnosed? This condition is diagnosed by measuring your blood pressure while you are seated, with your arm resting on a surface. The cuff of the blood pressure monitor will be placed directly against the skin of your upper arm at the level of your heart. It should be measured at least twice using the same arm. Certain conditions can cause a difference in blood pressure between your right and left arms. Certain factors can cause blood pressure readings to be lower or higher than normal (elevated) for a short  period of time:  When your blood pressure is higher when you are in a health care provider's office than when you are at home, this is called white coat hypertension. Most people with this condition do not need medicines.  When your blood pressure is higher at home than when you are in a health care provider's office, this is called masked hypertension. Most people with this condition may need medicines to control blood pressure.  If you have a high blood pressure reading during one visit or you have normal blood pressure with other risk factors:  You may be asked to return on a different day to have your blood pressure checked again.  You may be asked to monitor your blood pressure at home for 1 week or longer.  If you are diagnosed with hypertension, you may have other blood or imaging tests to help your health care provider understand your overall risk for other conditions. How is this treated? This condition is treated by making healthy lifestyle changes, such as eating healthy foods, exercising more, and reducing your alcohol intake. Your health care provider may prescribe medicine if lifestyle changes are not enough to get your blood pressure under control, and if:  Your systolic blood pressure is above 130.  Your diastolic blood pressure is above 80.  Your personal target blood pressure may vary depending on your medical conditions, your age, and other factors. Follow these instructions at home: Eating and drinking  Eat a diet that is high in fiber and potassium, and low in sodium, added sugar, and fat. An example eating plan is called the DASH (Dietary Approaches to Stop Hypertension) diet. To eat this way: ? Eat plenty of fresh fruits and vegetables. Try to fill half of your plate at each meal with fruits and vegetables. ? Eat whole grains, such as whole wheat pasta, brown rice, or whole grain bread. Fill about one quarter of your plate with whole grains. ? Eat or drink low-fat  dairy products, such as skim milk or low-fat yogurt. ? Avoid fatty cuts of meat, processed or cured meats, and poultry with skin. Fill about one quarter of your plate with lean proteins, such as fish, chicken without skin, beans, eggs, and tofu. ? Avoid premade and processed foods. These tend to be higher in sodium, added sugar, and fat.  Reduce your daily sodium intake. Most people with hypertension should eat less than 1,500 mg of sodium a day.  Limit alcohol intake to no more than 1 drink a day for nonpregnant women and 2 drinks a day for men. One drink equals 12 oz of beer, 5 oz of wine, or 1 oz of hard liquor. Lifestyle  Work with your health care provider to maintain a healthy body weight or to lose weight. Ask what an ideal weight is for you.  Get at least 30 minutes of exercise that causes your heart to beat faster (aerobic exercise) most days of the week. Activities may include walking, swimming, or biking.  Include exercise to strengthen your muscles (resistance exercise), such as pilates or lifting weights, as part of your weekly exercise routine. Try to do these types of exercises for 30 minutes at least 3 days a week.  Do not use any products that contain nicotine or tobacco, such as cigarettes and e-cigarettes. If you need help quitting, ask your health care provider.  Monitor your blood pressure at home as told by your health care provider.  Keep all follow-up visits as told by your health care provider. This is important. Medicines  Take over-the-counter and prescription medicines only as told by your health care provider. Follow directions carefully. Blood pressure medicines must be taken as prescribed.  Do not skip doses of blood pressure  medicine. Doing this puts you at risk for problems and can make the medicine less effective.  Ask your health care provider about side effects or reactions to medicines that you should watch for. Contact a health care provider  if:  You think you are having a reaction to a medicine you are taking.  You have headaches that keep coming back (recurring).  You feel dizzy.  You have swelling in your ankles.  You have trouble with your vision. Get help right away if:  You develop a severe headache or confusion.  You have unusual weakness or numbness.  You feel faint.  You have severe pain in your chest or abdomen.  You vomit repeatedly.  You have trouble breathing. Summary  Hypertension is when the force of blood pumping through your arteries is too strong. If this condition is not controlled, it may put you at risk for serious complications.  Your personal target blood pressure may vary depending on your medical conditions, your age, and other factors. For most people, a normal blood pressure is less than 120/80.  Hypertension is treated with lifestyle changes, medicines, or a combination of both. Lifestyle changes include weight loss, eating a healthy, low-sodium diet, exercising more, and limiting alcohol. This information is not intended to replace advice given to you by your health care provider. Make sure you discuss any questions you have with your health care provider. Document Released: 05/27/2005 Document Revised: 04/24/2016 Document Reviewed: 04/24/2016 Elsevier Interactive Patient Education  2018 [employer]      [name], MD [facility]

## 2017-08-29 NOTE — Assessment & Plan Note (Signed)
Fairly well controlled.  Encouraged to continue in home blood pressure monitoring.  Continue present medication.  Follow-up in 6 months.

## 2017-08-29 NOTE — Assessment & Plan Note (Signed)
Presently under the care of urology for prostate cancer workup.

## 2017-09-22 ENCOUNTER — Ambulatory Visit
Admission: RE | Admit: 2017-09-22 | Discharge: 2017-09-22 | Disposition: A | Discharge status: Home / Self Care | Payer: 59 | Source: Ambulatory Visit | Attending: Urology | Admitting: Urology

## 2017-09-22 DIAGNOSIS — C61 Malignant neoplasm of prostate: Secondary | ICD-10-CM

## 2017-09-22 MED ORDER — GADOBENATE DIMEGLUMINE 529 MG/ML IV SOLN
20.0000 mL | Freq: Once | INTRAVENOUS | Status: AC | PRN
  Administered 2017-09-22: 20 mL via INTRAVENOUS

## 2017-11-04 ENCOUNTER — Encounter: Payer: Self-pay | Admitting: Family Medicine

## 2017-11-27 ENCOUNTER — Other Ambulatory Visit: Payer: Self-pay | Admitting: Emergency Medicine

## 2017-11-27 NOTE — Telephone Encounter (Signed)
Refill request Alprazolam 1 mg  Last OV 08/29/2017 Dr Mitchel Honour  Last filled  01/09/2017  30 tabs with 1 refill Dr Mitchel Honour  Pharmacy on file

## 2017-11-28 ENCOUNTER — Other Ambulatory Visit: Payer: Self-pay | Admitting: Emergency Medicine

## 2017-11-28 ENCOUNTER — Other Ambulatory Visit: Payer: Self-pay | Admitting: *Deleted

## 2018-01-31 ENCOUNTER — Other Ambulatory Visit: Payer: Self-pay | Admitting: Emergency Medicine

## 2018-02-02 NOTE — Telephone Encounter (Signed)
Lisinopril-hctz 10-12.5 mg refill Last Refill:01/09/17 # 30 and 11 refills, prescription expired on 01/09/18 Last OV: 08/29/17 PCP: [place]: [facility] # [phone]

## 2018-03-03 ENCOUNTER — Ambulatory Visit (INDEPENDENT_AMBULATORY_CARE_PROVIDER_SITE_OTHER): Payer: 59 | Admitting: Emergency Medicine

## 2018-03-03 ENCOUNTER — Encounter: Payer: Self-pay | Admitting: Emergency Medicine

## 2018-03-03 VITALS — BP 129/81 | HR 68 | Temp 98.5°F | Resp 17 | Ht 74.5 in | Wt 313.0 lb

## 2018-03-03 DIAGNOSIS — I10 Essential (primary) hypertension: Secondary | ICD-10-CM | POA: Diagnosis not present

## 2018-03-03 DIAGNOSIS — R972 Elevated prostate specific antigen [PSA]: Secondary | ICD-10-CM | POA: Diagnosis not present

## 2018-03-03 DIAGNOSIS — Z23 Encounter for immunization: Secondary | ICD-10-CM | POA: Diagnosis not present

## 2018-03-03 DIAGNOSIS — N400 Enlarged prostate without lower urinary tract symptoms: Secondary | ICD-10-CM

## 2018-03-03 MED ORDER — LISINOPRIL-HYDROCHLOROTHIAZIDE 10-12.5 MG PO TABS: 1.0000 | ORAL_TABLET | Freq: Every day | ORAL | 3 refills | Status: DC

## 2018-03-03 NOTE — Patient Instructions (Addendum)
   If you have lab work done today you will be contacted with your lab results within the next 2 weeks.  If you have not heard from us then please contact us. The fastest way to get your results is to register for My Chart.   IF you received an x-ray today, you will receive an invoice from [facility]. Please contact [facility] at [phone] with questions or concerns regarding your invoice.   IF you received labwork today, you will receive an invoice from [facility]. Please contact [facility] at [phone] with questions or concerns regarding your invoice.   Our billing staff will not be able to assist you with questions regarding bills from these companies.  You will be contacted with the lab results as soon as they are available. The fastest way to get your results is to activate your My Chart account. Instructions are located on the last page of this paperwork. If you have not heard from us regarding the results in 2 weeks, please contact this office.     Hypertension Hypertension, commonly called high blood pressure, is when the force of blood pumping through the arteries is too strong. The arteries are the blood vessels that carry blood from the heart throughout the body. Hypertension forces the heart to work harder to pump blood and may cause arteries to become narrow or stiff. Having untreated or uncontrolled hypertension can cause heart attacks, strokes, kidney disease, and other problems. A blood pressure reading consists of a higher number over a lower number. Ideally, your blood pressure should be below 120/80. The first ("top") number is called the systolic pressure. It is a measure of the pressure in your arteries as your heart beats. The second ("bottom") number is called the diastolic pressure. It is a measure of the pressure in your arteries as the heart relaxes. What are the causes? The cause of this condition is not known. What increases the risk? Some  risk factors for high blood pressure are under your control. Others are not. Factors you can change  Smoking.  Having type 2 diabetes mellitus, high cholesterol, or both.  Not getting enough exercise or physical activity.  Being overweight.  Having too much fat, sugar, calories, or salt (sodium) in your diet.  Drinking too much alcohol. Factors that are difficult or impossible to change  Having chronic kidney disease.  Having a family history of high blood pressure.  Age. Risk increases with age.  Race. You may be at higher risk if you are African-American.  Gender. Men are at higher risk than women before age 45. After age 65, women are at higher risk than men.  Having obstructive sleep apnea.  Stress. What are the signs or symptoms? Extremely high blood pressure (hypertensive crisis) may cause:  Headache.  Anxiety.  Shortness of breath.  Nosebleed.  Nausea and vomiting.  Severe chest pain.  Jerky movements you cannot control (seizures).  How is this diagnosed? This condition is diagnosed by measuring your blood pressure while you are seated, with your arm resting on a surface. The cuff of the blood pressure monitor will be placed directly against the skin of your upper arm at the level of your heart. It should be measured at least twice using the same arm. Certain conditions can cause a difference in blood pressure between your right and left arms. Certain factors can cause blood pressure readings to be lower or higher than normal (elevated) for a short period of time:  When   your blood pressure is higher when you are in a health care provider's office than when you are at home, this is called white coat hypertension. Most people with this condition do not need medicines.  When your blood pressure is higher at home than when you are in a health care provider's office, this is called masked hypertension. Most people with this condition may need medicines to control  blood pressure.  If you have a high blood pressure reading during one visit or you have normal blood pressure with other risk factors:  You may be asked to return on a different day to have your blood pressure checked again.  You may be asked to monitor your blood pressure at home for 1 week or longer.  If you are diagnosed with hypertension, you may have other blood or imaging tests to help your health care provider understand your overall risk for other conditions. How is this treated? This condition is treated by making healthy lifestyle changes, such as eating healthy foods, exercising more, and reducing your alcohol intake. Your health care provider may prescribe medicine if lifestyle changes are not enough to get your blood pressure under control, and if:  Your systolic blood pressure is above 130.  Your diastolic blood pressure is above 80.  Your personal target blood pressure may vary depending on your medical conditions, your age, and other factors. Follow these instructions at home: Eating and drinking  Eat a diet that is high in fiber and potassium, and low in sodium, added sugar, and fat. An example eating plan is called the DASH (Dietary Approaches to Stop Hypertension) diet. To eat this way: ? Eat plenty of fresh fruits and vegetables. Try to fill half of your plate at each meal with fruits and vegetables. ? Eat whole grains, such as whole wheat pasta, brown rice, or whole grain bread. Fill about one quarter of your plate with whole grains. ? Eat or drink low-fat dairy products, such as skim milk or low-fat yogurt. ? Avoid fatty cuts of meat, processed or cured meats, and poultry with skin. Fill about one quarter of your plate with lean proteins, such as fish, chicken without skin, beans, eggs, and tofu. ? Avoid premade and processed foods. These tend to be higher in sodium, added sugar, and fat.  Reduce your daily sodium intake. Most people with hypertension should eat less  than 1,500 mg of sodium a day.  Limit alcohol intake to no more than 1 drink a day for nonpregnant women and 2 drinks a day for men. One drink equals 12 oz of beer, 5 oz of wine, or 1 oz of hard liquor. Lifestyle  Work with your health care provider to maintain a healthy body weight or to lose weight. Ask what an ideal weight is for you.  Get at least 30 minutes of exercise that causes your heart to beat faster (aerobic exercise) most days of the week. Activities may include walking, swimming, or biking.  Include exercise to strengthen your muscles (resistance exercise), such as pilates or lifting weights, as part of your weekly exercise routine. Try to do these types of exercises for 30 minutes at least 3 days a week.  Do not use any products that contain nicotine or tobacco, such as cigarettes and e-cigarettes. If you need help quitting, ask your health care provider.  Monitor your blood pressure at home as told by your health care provider.  Keep all follow-up visits as told by your health care provider.   This is important. Medicines  Take over-the-counter and prescription medicines only as told by your health care provider. Follow directions carefully. Blood pressure medicines must be taken as prescribed.  Do not skip doses of blood pressure medicine. Doing this puts you at risk for problems and can make the medicine less effective.  Ask your health care provider about side effects or reactions to medicines that you should watch for. Contact a health care provider if:  You think you are having a reaction to a medicine you are taking.  You have headaches that keep coming back (recurring).  You feel dizzy.  You have swelling in your ankles.  You have trouble with your vision. Get help right away if:  You develop a severe headache or confusion.  You have unusual weakness or numbness.  You feel faint.  You have severe pain in your chest or abdomen.  You vomit  repeatedly.  You have trouble breathing. Summary  Hypertension is when the force of blood pumping through your arteries is too strong. If this condition is not controlled, it may put you at risk for serious complications.  Your personal target blood pressure may vary depending on your medical conditions, your age, and other factors. For most people, a normal blood pressure is less than 120/80.  Hypertension is treated with lifestyle changes, medicines, or a combination of both. Lifestyle changes include weight loss, eating a healthy, low-sodium diet, exercising more, and limiting alcohol. This information is not intended to replace advice given to you by your health care provider. Make sure you discuss any questions you have with your health care provider. Document Released: 05/27/2005 Document Revised: 04/24/2016 Document Reviewed: 04/24/2016 Elsevier Interactive Patient Education  2018 [employer]vier Inc[employer]  

## 2018-03-03 NOTE — Progress Notes (Signed)
[name] [name] 67 y.o.   Chief Complaint  Patient presents with  . Follow-up    hypertension   . Medication Refill    Lisinopril    HISTORY OF PRESENT ILLNESS: This is a 67 y.o. male with history of hypertension here for follow-up today.  No medical concerns or complaints today.  Needs medication refill. Also has a history of prostate CA.  Seen urologist.  Doing well. BP Readings from Last 3 Encounters:  03/03/18 129/81  08/29/17 (!) 147/87  07/01/17 122/72    HPI   Prior to Admission medications   Medication Sig Start Date End Date Taking? Authorizing Provider  ALPRAZolam [name] [name]) 1 MG tablet TAKE 1 TABLET AT BEDTIME AS NEEDED 11/28/17  Yes [name] [name]  b complex vitamins tablet Take 1 tablet by mouth daily.     Yes Provider, Historical, [name]  cholecalciferol (VITAMIN D) 1000 UNITS tablet Take 1,000 Units by mouth daily.     Yes Provider, Historical, [name]  finasteride (PROPECIA) 1 MG tablet Take 1 mg by mouth daily.   Yes Provider, Historical, [name]  fish oil-omega-3 fatty acids 1000 MG capsule Take 1 g by mouth daily.     Yes Provider, Historical, [name]  lisinopril-hydrochlorothiazide (PRINZIDE,ZESTORETIC) 10-12.5 MG tablet Take 1 tablet by mouth daily. 01/09/17  Yes [name] [name], [name] [name]  methocarbamol (ROBAXIN) 500 MG tablet Take 1 tablet (500 mg total) 2 (two) times daily by mouth. 04/25/17  Yes [name] [name]  Multiple Vitamin (MULTIVITAMIN) tablet Take 1 tablet by mouth daily.     Yes Provider, Historical, [name]    Allergies  Allergen Reactions  . Aspirin     REACTION: upsets stomach  . Statins     REACTION: UPSETS STOMACH  . Gadolinium Derivatives Nausea And Vomiting    Pt was given 20 ml multihance and became nauseated and vomited several times.     Patient Active Problem List   Diagnosis Date Noted  . Elevated PSA 08/29/2017  . Prostate enlargement 08/29/2017  . Lower respiratory infection 07/01/2017  . Gout 10/25/2014  . Cough 02/18/2012  .  Hypertension   . BPH (benign prostatic hypertrophy)   . Kidney stones   . Prostate nodule     Past Medical History:  Diagnosis Date  . Allergy   . Anxiety   . Arthritis   . BPH (benign prostatic hypertrophy)   . Colon polyp    diverticular bleed with transfusion  . Hyperlipidemia   . Hypertension   . Kidney stones    15-20  . Obesity, unspecified   . Prostate nodule [id number]    Past Surgical History:  Procedure Laterality Date  . ANKLE FUSION    . HERNIA REPAIR    . JOINT REPLACEMENT    . TRANSURETHRAL RESECTION OF PROSTATE      Social History   Socioeconomic History  . Marital status: Married    Spouse name: Not on file  . Number of children: 2  . Years of education: Not on file  . Highest education level: Not on file  Occupational History  . Not on file  Social Needs  . Financial resource strain: Not on file  . Food insecurity:    Worry: Not on file    Inability: Not on file  . Transportation needs:    Medical: Not on file    Non-medical: Not on file  Tobacco Use  . Smoking status: Never Smoker  . Smokeless tobacco: Never Used  Substance and  Sexual Activity  . Alcohol use: No  . Drug use: No  . Sexual activity: Never  Lifestyle  . Physical activity:    Days per week: Not on file    Minutes per session: Not on file  . Stress: Not on file  Relationships  . Social connections:    Talks on phone: Not on file    Gets together: Not on file    Attends religious service: Not on file    Active member of club or organization: Not on file    Attends meetings of clubs or organizations: Not on file    Relationship status: Not on file  . Intimate partner violence:    Fear of current or ex partner: Not on file    Emotionally abused: Not on file    Physically abused: Not on file    Forced sexual activity: Not on file  Other Topics Concern  . Not on file  Social History Narrative  . Not on file    Family History  Problem Relation Age of Onset  .  Diabetes Mother   . Prostate cancer Unknown   . Diabetes Unknown      Review of Systems  Constitutional: Negative.  Negative for chills and fever.  HENT: Negative.  Negative for hearing loss.   Eyes: Negative.  Negative for blurred vision and double vision.  Respiratory: Negative.  Negative for cough and shortness of breath.   Cardiovascular: Negative.  Negative for chest pain and palpitations.  Gastrointestinal: Negative.  Negative for abdominal pain, blood in stool, diarrhea, melena, nausea and vomiting.  Genitourinary: Negative.  Negative for dysuria, flank pain and hematuria.  Musculoskeletal: Negative.  Negative for back pain, myalgias and neck pain.  Skin: Negative.  Negative for rash.  Neurological: Negative.  Negative for dizziness and headaches.  Endo/Heme/Allergies: Negative.   All other systems reviewed and are negative.     Vitals:   03/03/18 0923  BP: 129/81  Pulse: 68  Resp: 17  Temp: 98.5 F (36.9 C)  SpO2: 98%    Physical Exam  Constitutional: He is oriented to person, place, and time. He appears well-developed and well-nourished.  HENT:  Head: Normocephalic and atraumatic.  Nose: Nose normal.  Mouth/Throat: Oropharynx is clear and moist.  Eyes: Pupils are equal, round, and reactive to light. Conjunctivae and EOM are normal.  Neck: Normal range of motion. Neck supple.  Cardiovascular: Normal rate, regular rhythm and normal heart sounds.  Pulmonary/Chest: Effort normal and breath sounds normal.  Abdominal: Soft. Bowel sounds are normal. He exhibits no distension. There is no tenderness.  Musculoskeletal: Normal range of motion. He exhibits no edema or tenderness.  Neurological: He is alert and oriented to person, place, and time. No sensory deficit. He exhibits normal muscle tone.  Skin: Skin is warm and dry. Capillary refill takes less than 2 seconds.  Psychiatric: He has a normal mood and affect. His behavior is normal.  Vitals reviewed.  A total of  25 minutes was spent in the room with the patient, greater than 50% of which was in counseling/coordination of care regarding chronic medical problems, management, medications, and need for follow-up.  Hypertension Well-controlled and stable.  Continue present management.  Follow-up in 6 months.   ASSESSMENT & PLAN: [name] [name] was seen today for follow-up and medication refill.  Diagnoses and all orders for this visit:  Essential hypertension -     lisinopril-hydrochlorothiazide (PRINZIDE,ZESTORETIC) 10-12.5 MG tablet; Take 1 tablet by mouth daily.  Need for  prophylactic vaccination and inoculation against influenza -     Flu vaccine HIGH DOSE PF  Need for prophylactic vaccination against Streptococcus pneumoniae (pneumococcus) -     Pneumococcal conjugate vaccine 13-valent IM  Prostate enlargement  Elevated PSA    Patient Instructions       If you have lab work done today you will be contacted with your lab results within the next 2 weeks.  If you have not heard from [place] then please contact us. The fastest way to get your results is to register for My Chart.   IF you received an x-ray today, you will receive an invoice from [facility]. Please contact [facility] at [phone] with questions or concerns regarding your invoice.   IF you received labwork today, you will receive an invoice from [facility]. Please contact [facility] at [phone] with questions or concerns regarding your invoice.   Our billing staff will not be able to assist you with questions regarding bills from these companies.  You will be contacted with the lab results as soon as they are available. The fastest way to get your results is to activate your My Chart account. Instructions are located on the last page of this paperwork. If you have not heard from [place] regarding the results in 2 weeks, please contact this office.      Hypertension Hypertension, commonly called high blood  pressure, is when the force of blood pumping through the arteries is too strong. The arteries are the blood vessels that carry blood from the heart throughout the body. Hypertension forces the heart to work harder to pump blood and may cause arteries to become narrow or stiff. Having untreated or uncontrolled hypertension can cause heart attacks, strokes, kidney disease, and other problems. A blood pressure reading consists of a higher number over a lower number. Ideally, your blood pressure should be below 120/80. The first ("top") number is called the systolic pressure. It is a measure of the pressure in your arteries as your heart beats. The second ("bottom") number is called the diastolic pressure. It is a measure of the pressure in your arteries as the heart relaxes. What are the causes? The cause of this condition is not known. What increases the risk? Some risk factors for high blood pressure are under your control. Others are not. Factors you can change  Smoking.  Having type 2 diabetes mellitus, high cholesterol, or both.  Not getting enough exercise or physical activity.  Being overweight.  Having too much fat, sugar, calories, or salt (sodium) in your diet.  Drinking too much alcohol. Factors that are difficult or impossible to change  Having chronic kidney disease.  Having a family history of high blood pressure.  Age. Risk increases with age.  Race. You may be at higher risk if you are African-American.  Gender. Men are at higher risk than women before age 49. After age 73, women are at higher risk than men.  Having obstructive sleep apnea.  Stress. What are the signs or symptoms? Extremely high blood pressure (hypertensive crisis) may cause:  Headache.  Anxiety.  Shortness of breath.  Nosebleed.  Nausea and vomiting.  Severe chest pain.  Jerky movements you cannot control (seizures).  How is this diagnosed? This condition is diagnosed by measuring  your blood pressure while you are seated, with your arm resting on a surface. The cuff of the blood pressure monitor will be placed directly against the skin of your upper arm at the level of  your heart. It should be measured at least twice using the same arm. Certain conditions can cause a difference in blood pressure between your right and left arms. Certain factors can cause blood pressure readings to be lower or higher than normal (elevated) for a short period of time:  When your blood pressure is higher when you are in a health care provider's office than when you are at home, this is called white coat hypertension. Most people with this condition do not need medicines.  When your blood pressure is higher at home than when you are in a health care provider's office, this is called masked hypertension. Most people with this condition may need medicines to control blood pressure.  If you have a high blood pressure reading during one visit or you have normal blood pressure with other risk factors:  You may be asked to return on a different day to have your blood pressure checked again.  You may be asked to monitor your blood pressure at home for 1 week or longer.  If you are diagnosed with hypertension, you may have other blood or imaging tests to help your health care provider understand your overall risk for other conditions. How is this treated? This condition is treated by making healthy lifestyle changes, such as eating healthy foods, exercising more, and reducing your alcohol intake. Your health care provider may prescribe medicine if lifestyle changes are not enough to get your blood pressure under control, and if:  Your systolic blood pressure is above 130.  Your diastolic blood pressure is above 80.  Your personal target blood pressure may vary depending on your medical conditions, your age, and other factors. Follow these instructions at home: Eating and drinking  Eat a diet that  is high in fiber and potassium, and low in sodium, added sugar, and fat. An example eating plan is called the DASH (Dietary Approaches to Stop Hypertension) diet. To eat this way: ? Eat plenty of fresh fruits and vegetables. Try to fill half of your plate at each meal with fruits and vegetables. ? Eat whole grains, such as whole wheat pasta, brown rice, or whole grain bread. Fill about one quarter of your plate with whole grains. ? Eat or drink low-fat dairy products, such as skim milk or low-fat yogurt. ? Avoid fatty cuts of meat, processed or cured meats, and poultry with skin. Fill about one quarter of your plate with lean proteins, such as fish, chicken without skin, beans, eggs, and tofu. ? Avoid premade and processed foods. These tend to be higher in sodium, added sugar, and fat.  Reduce your daily sodium intake. Most people with hypertension should eat less than 1,500 mg of sodium a day.  Limit alcohol intake to no more than 1 drink a day for nonpregnant women and 2 drinks a day for men. One drink equals 12 oz of beer, 5 oz of wine, or 1 oz of hard liquor. Lifestyle  Work with your health care provider to maintain a healthy body weight or to lose weight. Ask what an ideal weight is for you.  Get at least 30 minutes of exercise that causes your heart to beat faster (aerobic exercise) most days of the week. Activities may include walking, swimming, or biking.  Include exercise to strengthen your muscles (resistance exercise), such as pilates or lifting weights, as part of your weekly exercise routine. Try to do these types of exercises for 30 minutes at least 3 days a week.  Do  not use any products that contain nicotine or tobacco, such as cigarettes and e-cigarettes. If you need help quitting, ask your health care provider.  Monitor your blood pressure at home as told by your health care provider.  Keep all follow-up visits as told by your health care provider. This is  important. Medicines  Take over-the-counter and prescription medicines only as told by your health care provider. Follow directions carefully. Blood pressure medicines must be taken as prescribed.  Do not skip doses of blood pressure medicine. Doing this puts you at risk for problems and can make the medicine less effective.  Ask your health care provider about side effects or reactions to medicines that you should watch for. Contact a health care provider if:  You think you are having a reaction to a medicine you are taking.  You have headaches that keep coming back (recurring).  You feel dizzy.  You have swelling in your ankles.  You have trouble with your vision. Get help right away if:  You develop a severe headache or confusion.  You have unusual weakness or numbness.  You feel faint.  You have severe pain in your chest or abdomen.  You vomit repeatedly.  You have trouble breathing. Summary  Hypertension is when the force of blood pumping through your arteries is too strong. If this condition is not controlled, it may put you at risk for serious complications.  Your personal target blood pressure may vary depending on your medical conditions, your age, and other factors. For most people, a normal blood pressure is less than 120/80.  Hypertension is treated with lifestyle changes, medicines, or a combination of both. Lifestyle changes include weight loss, eating a healthy, low-sodium diet, exercising more, and limiting alcohol. This information is not intended to replace advice given to you by your health care provider. Make sure you discuss any questions you have with your health care provider. Document Released: 05/27/2005 Document Revised: 04/24/2016 Document Reviewed: 04/24/2016 Elsevier Interactive Patient Education  2018 [employer] Inc.      [name], [name]

## 2018-03-03 NOTE — Assessment & Plan Note (Signed)
Well-controlled and stable.  Continue present management.  Follow-up in 6 months.

## 2018-06-10 DIAGNOSIS — C61 Malignant neoplasm of prostate: Secondary | ICD-10-CM

## 2018-06-10 HISTORY — DX: Malignant neoplasm of prostate: C61

## 2018-06-10 HISTORY — PX: TRANSURETHRAL RESECTION OF PROSTATE: SHX73

## 2018-06-22 ENCOUNTER — Other Ambulatory Visit: Payer: Self-pay

## 2018-06-22 ENCOUNTER — Encounter: Payer: Self-pay | Admitting: Emergency Medicine

## 2018-06-22 ENCOUNTER — Ambulatory Visit (INDEPENDENT_AMBULATORY_CARE_PROVIDER_SITE_OTHER): Payer: 59 | Admitting: Emergency Medicine

## 2018-06-22 VITALS — BP 125/77 | HR 79 | Temp 98.6°F | Resp 16 | Ht 74.0 in | Wt 316.4 lb

## 2018-06-22 DIAGNOSIS — R21 Rash and other nonspecific skin eruption: Secondary | ICD-10-CM | POA: Diagnosis not present

## 2018-06-22 DIAGNOSIS — B372 Candidiasis of skin and nail: Secondary | ICD-10-CM | POA: Diagnosis not present

## 2018-06-22 MED ORDER — CLOTRIMAZOLE-BETAMETHASONE 1-0.05 % EX CREA: 1.0000 "application " | TOPICAL_CREAM | Freq: Two times a day (BID) | CUTANEOUS | 3 refills | Status: AC

## 2018-06-22 NOTE — Progress Notes (Signed)
[name] [name] 68 y.o.   Chief Complaint  Patient presents with  . Rash    under right armpit x 2 wks, "itch sometimes" haven't changed deodorants, changed to a mild soap since having itching    HISTORY OF PRESENT ILLNESS: This is a 68 y.o. male complaining of itchy rash to right armpit that started 2 weeks ago.  No other significant symptoms.  HPI   Prior to Admission medications   Medication Sig Start Date End Date Taking? Authorizing Provider  ALPRAZolam Duanne Moron) 1 MG tablet TAKE 1 TABLET AT BEDTIME AS NEEDED 11/28/17  Yes [name] [name]  b complex vitamins tablet Take 1 tablet by mouth daily.     Yes Provider, Historical, [name]  cholecalciferol (VITAMIN D) 1000 UNITS tablet Take 1,000 Units by mouth daily.     Yes Provider, Historical, [name]  finasteride (PROPECIA) 1 MG tablet Take 1 mg by mouth daily.   Yes Provider, Historical, [name]  fish oil-omega-3 fatty acids 1000 MG capsule Take 1 g by mouth daily.     Yes Provider, Historical, [name]  lisinopril-hydrochlorothiazide (PRINZIDE,ZESTORETIC) 10-12.5 MG tablet Take 1 tablet by mouth daily. 03/03/18  Yes [name] [name]  methocarbamol (ROBAXIN) 500 MG tablet Take 1 tablet (500 mg total) 2 (two) times daily by mouth. 04/25/17  Yes [name] [name]  Multiple Vitamin (MULTIVITAMIN) tablet Take 1 tablet by mouth daily.     Yes Provider, Historical, [name]    Allergies  Allergen Reactions  . Aspirin     REACTION: upsets stomach  . Statins     REACTION: UPSETS STOMACH  . Gadolinium Derivatives Nausea And Vomiting    Pt was given 20 ml multihance and became nauseated and vomited several times.     Patient Active Problem List   Diagnosis Date Noted  . Elevated PSA 08/29/2017  . Prostate enlargement 08/29/2017  . Lower respiratory infection 07/01/2017  . Gout 10/25/2014  . Cough 02/18/2012  . Hypertension   . BPH (benign prostatic hypertrophy)   . Kidney stones   . Prostate nodule     Past Medical History:    Diagnosis Date  . Allergy   . Anxiety   . Arthritis   . BPH (benign prostatic hypertrophy)   . Colon polyp    diverticular bleed with transfusion  . Hyperlipidemia   . Hypertension   . Kidney stones    15-20  . Obesity, unspecified   . Prostate nodule [id number]    Past Surgical History:  Procedure Laterality Date  . ANKLE FUSION    . HERNIA REPAIR    . JOINT REPLACEMENT    . TRANSURETHRAL RESECTION OF PROSTATE      Social History   Socioeconomic History  . Marital status: Married    Spouse name: Not on file  . Number of children: 2  . Years of education: Not on file  . Highest education level: Not on file  Occupational History  . Not on file  Social Needs  . Financial resource strain: Not on file  . Food insecurity:    Worry: Not on file    Inability: Not on file  . Transportation needs:    Medical: Not on file    Non-medical: Not on file  Tobacco Use  . Smoking status: Never Smoker  . Smokeless tobacco: Never Used  Substance and Sexual Activity  . Alcohol use: No  . Drug use: No  . Sexual activity: Never  Lifestyle  . Physical activity:  Days per week: Not on file    Minutes per session: Not on file  . Stress: Not on file  Relationships  . Social connections:    Talks on phone: Not on file    Gets together: Not on file    Attends religious service: Not on file    Active member of club or organization: Not on file    Attends meetings of clubs or organizations: Not on file    Relationship status: Not on file  . Intimate partner violence:    Fear of current or ex partner: Not on file    Emotionally abused: Not on file    Physically abused: Not on file    Forced sexual activity: Not on file  Other Topics Concern  . Not on file  Social History Narrative  . Not on file    Family History  Problem Relation Age of Onset  . Diabetes Mother   . Prostate cancer Unknown   . Diabetes Unknown      Review of Systems  Constitutional: Negative for  chills and fever.  HENT: Negative for sore throat.   Eyes: Negative for discharge and redness.  Respiratory: Negative for cough and shortness of breath.   Cardiovascular: Negative for chest pain and palpitations.  Gastrointestinal: Negative for nausea and vomiting.  Musculoskeletal: Negative for back pain, myalgias and neck pain.  Skin: Positive for itching and rash.  Neurological: Negative for dizziness and headaches.  All other systems reviewed and are negative.  Vitals:   06/22/18 1124  BP: 125/77  Pulse: 79  Resp: 16  Temp: 98.6 F (37 C)  SpO2: 99%     Physical Exam Vitals signs reviewed.  Constitutional:      Appearance: Normal appearance.  HENT:     Head: Normocephalic and atraumatic.     Mouth/Throat:     Mouth: Mucous membranes are moist.     Pharynx: Oropharynx is clear.  Eyes:     Extraocular Movements: Extraocular movements intact.     Pupils: Pupils are equal, round, and reactive to light.  Neck:     Musculoskeletal: Normal range of motion and neck supple.  Cardiovascular:     Rate and Rhythm: Normal rate and regular rhythm.     Heart sounds: Normal heart sounds.  Pulmonary:     Effort: Pulmonary effort is normal.     Breath sounds: Normal breath sounds.  Skin:    General: Skin is warm and dry.     Capillary Refill: Capillary refill takes less than 2 seconds.     Findings: Rash present.     Comments: Positive rash to right underarm.  Neurological:     General: No focal deficit present.     Mental Status: He is alert and oriented to person, place, and time.  Psychiatric:        Mood and Affect: Mood normal.        Behavior: Behavior normal.        ASSESSMENT & PLAN: [name] was seen today for rash.  Diagnoses and all orders for this visit:  Yeast dermatitis -     clotrimazole-betamethasone (LOTRISONE) cream; Apply 1 application topically 2 (two) times daily for 10 days.  Rash and nonspecific skin eruption    Patient Instructions        If you have lab work done today you will be contacted with your lab results within the next 2 weeks.  If you have not heard from [place] then please  contact us. The fastest way to get your results is to register for My Chart.   IF you received an x-ray today, you will receive an invoice from [facility]. Please contact [facility] at [phone] with questions or concerns regarding your invoice.   IF you received labwork today, you will receive an invoice from [facility]. Please contact [facility] at [phone] with questions or concerns regarding your invoice.   Our billing staff will not be able to assist you with questions regarding bills from these companies.  You will be contacted with the lab results as soon as they are available. The fastest way to get your results is to activate your My Chart account. Instructions are located on the last page of this paperwork. If you have not heard from [place] regarding the results in 2 weeks, please contact this office.     Skin Yeast Infection  A skin yeast infection is a condition in which there is an overgrowth of yeast (candida) that normally lives on the skin. This condition usually occurs in areas of the skin that are constantly warm and moist, such as the armpits or the groin. What are the causes? This condition is caused by a change in the normal balance of the yeast and bacteria that live on the skin. What increases the risk? You are more likely to develop this condition if you:  Are obese.  Are pregnant.  Take birth control pills.  Have diabetes.  Take antibiotic medicines.  Take steroid medicines.  Are malnourished.  Have a weak body defense system (immune system).  Are 48 years of age or older.  Wear tight clothing. What are the signs or symptoms? The most common symptom of this condition is itchiness in the affected area. Other symptoms include:  Red, swollen area of the skin.  Bumps on the  skin. How is this diagnosed?  This condition is diagnosed with a medical history and physical exam.  Your health care provider may check for yeast by taking light scrapings of the skin to be viewed under a microscope. How is this treated? This condition is treated with medicine. Medicines may be prescribed or be available over the counter. The medicines may be:  Taken by mouth (orally).  Applied as a cream or powder to your skin. Follow these instructions at home:   Take or apply over-the-counter and prescription medicines only as told by your health care provider.  Maintain a healthy weight. If you need help losing weight, talk with your health care provider.  Keep your skin clean and dry.  If you have diabetes, keep your blood sugar under control.  Keep all follow-up visits as told by your health care provider. This is important. Contact a health care provider if:  Your symptoms go away and then return.  Your symptoms do not get better with treatment.  Your symptoms get worse.  Your rash spreads.  You have a fever or chills.  You have new symptoms.  You have new warmth or redness of your skin. Summary  A skin yeast infection is a condition in which there is an overgrowth of yeast (candida) that normally lives on the skin. This condition is caused by a change in the normal balance of the yeast and bacteria that live on the skin.  Take or apply over-the-counter and prescription medicines only as told by your health care provider.  Keep your skin clean and dry.  Contact a health care provider if your symptoms do not get  better with treatment. This information is not intended to replace advice given to you by your health care provider. Make sure you discuss any questions you have with your health care provider. Document Released: 02/12/2011 Document Revised: 10/14/2017 Document Reviewed: 10/14/2017 Elsevier Interactive Patient Education  2019 Elsevier  Inc.      [name], [name]

## 2018-06-22 NOTE — Patient Instructions (Addendum)
If you have lab work done today you will be contacted with your lab results within the next 2 weeks.  If you have not heard from [place] then please contact us. The fastest way to get your results is to register for My Chart.   IF you received an x-ray today, you will receive an invoice from [facility]. Please contact [facility] at [phone] with questions or concerns regarding your invoice.   IF you received labwork today, you will receive an invoice from [facility]. Please contact [facility] at [phone] with questions or concerns regarding your invoice.   Our billing staff will not be able to assist you with questions regarding bills from these companies.  You will be contacted with the lab results as soon as they are available. The fastest way to get your results is to activate your My Chart account. Instructions are located on the last page of this paperwork. If you have not heard from [place] regarding the results in 2 weeks, please contact this office.     Skin Yeast Infection  A skin yeast infection is a condition in which there is an overgrowth of yeast (candida) that normally lives on the skin. This condition usually occurs in areas of the skin that are constantly warm and moist, such as the armpits or the groin. What are the causes? This condition is caused by a change in the normal balance of the yeast and bacteria that live on the skin. What increases the risk? You are more likely to develop this condition if you:  Are obese.  Are pregnant.  Take birth control pills.  Have diabetes.  Take antibiotic medicines.  Take steroid medicines.  Are malnourished.  Have a weak body defense system (immune system).  Are 66 years of age or older.  Wear tight clothing. What are the signs or symptoms? The most common symptom of this condition is itchiness in the affected area. Other symptoms include:  Red, swollen area of the skin.  Bumps on the  skin. How is this diagnosed?  This condition is diagnosed with a medical history and physical exam.  Your health care provider may check for yeast by taking light scrapings of the skin to be viewed under a microscope. How is this treated? This condition is treated with medicine. Medicines may be prescribed or be available over the counter. The medicines may be:  Taken by mouth (orally).  Applied as a cream or powder to your skin. Follow these instructions at home:   Take or apply over-the-counter and prescription medicines only as told by your health care provider.  Maintain a healthy weight. If you need help losing weight, talk with your health care provider.  Keep your skin clean and dry.  If you have diabetes, keep your blood sugar under control.  Keep all follow-up visits as told by your health care provider. This is important. Contact a health care provider if:  Your symptoms go away and then return.  Your symptoms do not get better with treatment.  Your symptoms get worse.  Your rash spreads.  You have a fever or chills.  You have new symptoms.  You have new warmth or redness of your skin. Summary  A skin yeast infection is a condition in which there is an overgrowth of yeast (candida) that normally lives on the skin. This condition is caused by a change in the normal balance of the yeast and bacteria that live on the skin.  Take  or apply over-the-counter and prescription medicines only as told by your health care provider.  Keep your skin clean and dry.  Contact a health care provider if your symptoms do not get better with treatment. This information is not intended to replace advice given to you by your health care provider. Make sure you discuss any questions you have with your health care provider. Document Released: 02/12/2011 Document Revised: 10/14/2017 Document Reviewed: 10/14/2017 Elsevier Interactive Patient Education  2019 [employer].

## 2018-07-30 ENCOUNTER — Other Ambulatory Visit: Payer: Self-pay

## 2018-07-30 ENCOUNTER — Encounter: Payer: Self-pay | Admitting: Family Medicine

## 2018-07-30 ENCOUNTER — Ambulatory Visit (INDEPENDENT_AMBULATORY_CARE_PROVIDER_SITE_OTHER): Payer: 59 | Admitting: Family Medicine

## 2018-07-30 VITALS — BP 130/70 | HR 92 | Temp 98.4°F | Ht 74.0 in | Wt 320.9 lb

## 2018-07-30 DIAGNOSIS — J3489 Other specified disorders of nose and nasal sinuses: Secondary | ICD-10-CM

## 2018-07-30 DIAGNOSIS — R6889 Other general symptoms and signs: Secondary | ICD-10-CM

## 2018-07-30 DIAGNOSIS — Z09 Encounter for follow-up examination after completed treatment for conditions other than malignant neoplasm: Secondary | ICD-10-CM | POA: Diagnosis not present

## 2018-07-30 NOTE — Progress Notes (Signed)
Sick Visit--Established Patient  Subjective:  Patient ID: [name] [name], male    DOB: [birth year]  Age: 68 y.o. MRN: [id number]  CC:  Chief Complaint  Patient presents with  . Cough    1 week   . Nasal Congestion    HPI [name] [name] is a 68 year old male who presents for a Sick Visit.   Past Medical History:  Diagnosis Date  . Allergy   . Anxiety   . Arthritis   . BPH (benign prostatic hypertrophy)   . Colon polyp    diverticular bleed with transfusion  . Hyperlipidemia   . Hypertension   . Kidney stones    15-20  . Obesity, unspecified   . Prostate nodule [id number]   Current Status: Since her last office visit, he has been having cold symptoms X 6 days. He reports sinus burning and pressure, cough, and body aches. He states that his symptoms are improving. He has been taking OTC Mucinex as directed. He denies fevers, chills, fatigue, recent infections, weight loss, and night sweats. He has not had any headaches, visual changes, dizziness, and falls. No chest pain, heart palpitations, cough and shortness of breath reported. No reports of GI problems such as nausea, vomiting, diarrhea, and constipation. He has no reports of blood in stools, dysuria and hematuria. No depression or anxiety reported. He denies pain today.   Past Surgical History:  Procedure Laterality Date  . ANKLE FUSION    . HERNIA REPAIR    . JOINT REPLACEMENT    . TRANSURETHRAL RESECTION OF PROSTATE      Family History  Problem Relation Age of Onset  . Diabetes Mother   . Prostate cancer Unknown   . Diabetes Unknown     Social History   Socioeconomic History  . Marital status: Married    Spouse name: Not on file  . Number of children: 2  . Years of education: Not on file  . Highest education level: Not on file  Occupational History  . Not on file  Social Needs  . Financial resource strain: Not on file  . Food insecurity:    Worry: Not on file    Inability: Not on file  .  Transportation needs:    Medical: Not on file    Non-medical: Not on file  Tobacco Use  . Smoking status: Never Smoker  . Smokeless tobacco: Never Used  Substance and Sexual Activity  . Alcohol use: No  . Drug use: No  . Sexual activity: Never  Lifestyle  . Physical activity:    Days per week: Not on file    Minutes per session: Not on file  . Stress: Not on file  Relationships  . Social connections:    Talks on phone: Not on file    Gets together: Not on file    Attends religious service: Not on file    Active member of club or organization: Not on file    Attends meetings of clubs or organizations: Not on file    Relationship status: Not on file  . Intimate partner violence:    Fear of current or ex partner: Not on file    Emotionally abused: Not on file    Physically abused: Not on file    Forced sexual activity: Not on file  Other Topics Concern  . Not on file  Social History Narrative  . Not on file    Outpatient Medications Prior to Visit  Medication Sig Dispense Refill  .  ALPRAZolam (XANAX) 1 MG tablet TAKE 1 TABLET AT BEDTIME AS NEEDED 20 tablet 0  . b complex vitamins tablet Take 1 tablet by mouth daily.      . finasteride (PROPECIA) 1 MG tablet Take 1 mg by mouth daily.    [name] Kitchen lisinopril-hydrochlorothiazide (PRINZIDE,ZESTORETIC) 10-12.5 MG tablet Take 1 tablet by mouth daily. 90 tablet 3  . methocarbamol (ROBAXIN) 500 MG tablet Take 1 tablet (500 mg total) 2 (two) times daily by mouth. 20 tablet 0  . cholecalciferol (VITAMIN D) 1000 UNITS tablet Take 1,000 Units by mouth daily.      . fish oil-omega-3 fatty acids 1000 MG capsule Take 1 g by mouth daily.      . Multiple Vitamin (MULTIVITAMIN) tablet Take 1 tablet by mouth daily.       No facility-administered medications prior to visit.     Allergies  Allergen Reactions  . Aspirin     REACTION: upsets stomach  . Statins     REACTION: UPSETS STOMACH  . Gadolinium Derivatives Nausea And Vomiting    Pt was  given 20 ml multihance and became nauseated and vomited several times.     ROS Review of Systems  Constitutional: Positive for fatigue.  HENT: Negative.   Eyes: Negative.   Respiratory: Positive for cough.   Cardiovascular: Negative.   Gastrointestinal: Negative.   Endocrine: Negative.   Genitourinary: Negative.   Musculoskeletal: Positive for arthralgias.       Body Aches  Skin: Negative.   Allergic/Immunologic: Negative.   Neurological: Positive for dizziness and headaches.  Hematological: Negative.   Psychiatric/Behavioral: Negative.    Objective:    Physical Exam  Constitutional: He is oriented to person, place, and time. He appears well-developed and well-nourished.  HENT:  Head: Normocephalic and atraumatic.  Eyes: Conjunctivae are normal.  Neck: Normal range of motion. Neck supple.  Cardiovascular: Normal rate, regular rhythm, normal heart sounds and intact distal pulses.  Pulmonary/Chest: Effort normal and breath sounds normal.  Abdominal: Soft. Bowel sounds are normal. He exhibits distension (Obese).  Musculoskeletal: Normal range of motion.  Neurological: He is alert and oriented to person, place, and time. He has normal reflexes.  Skin: Skin is warm and dry.  Psychiatric: He has a normal mood and affect. His behavior is normal. Judgment and thought content normal.  Nursing note and vitals reviewed.   BP 130/70   Pulse 92   Temp 98.4 F (36.9 C) (Oral)   Ht 6\' 2"  (1.88 m)   Wt (!) 320 lb 14.4 oz (145.6 kg)   SpO2 95%   BMI 41.20 kg/m  Wt Readings from Last 3 Encounters:  07/30/18 (!) 320 lb 14.4 oz (145.6 kg)  06/22/18 (!) 316 lb 6.4 oz (143.5 kg)  03/03/18 (!) 313 lb (142 kg)   There are no preventive care reminders to display for this patient.  There are no preventive care reminders to display for this patient.  Lab Results  Component Value Date   TSH 1.830 01/09/2017   Lab Results  Component Value Date   WBC 4.5 01/09/2017   HGB 13.2  01/09/2017   HCT 40.8 01/09/2017   MCV 82 01/09/2017   PLT 223 01/09/2017   Lab Results  Component Value Date   NA 143 01/09/2017   K 4.1 01/09/2017   CO2 23 01/09/2017   GLUCOSE 93 01/09/2017   BUN 16 01/09/2017   CREATININE 1.22 01/09/2017   BILITOT 0.3 01/09/2017   ALKPHOS 57 01/09/2017   AST  26 01/09/2017   ALT 25 01/09/2017   PROT 7.2 01/09/2017   ALBUMIN 4.3 01/09/2017   CALCIUM 9.7 01/09/2017   Lab Results  Component Value Date   CHOL 215 (H) 01/09/2017   Lab Results  Component Value Date   HDL 38 (L) 01/09/2017   Lab Results  Component Value Date   LDLCALC 149 (H) 01/09/2017   Lab Results  Component Value Date   TRIG 140 01/09/2017   Lab Results  Component Value Date   CHOLHDL 5.7 (H) 01/09/2017   Lab Results  Component Value Date   HGBA1C 6.3 (H) 01/09/2017    Assessment & Plan:   1. Flu-like symptoms Improved. Continue OTC Cold/Flu medication, Mucinex, increase fluids, and get plenty of rest. He will contact office if symptoms do not improve or worsen. Additional instructions given via AVS at discharge.   2. Sinus pain  3. Follow up He will follow up as needed.   No orders of the defined types were placed in this encounter.   No orders of the defined types were placed in this encounter.  Referral Orders  No referral(s) requested today   Kathe Becton,  MSN, FNP-C [facility] [place], [place] [address] [phone]  Problem List Items Addressed This Visit    None    Visit Diagnoses    Flu-like symptoms    -  Primary   Sinus pain       Follow up          No orders of the defined types were placed in this encounter.   Follow-up: No follow-ups on file.    Azzie Glatter, FNP

## 2018-07-30 NOTE — Patient Instructions (Addendum)
If you have lab work done today you will be contacted with your lab results within the next 2 weeks.  If you have not heard from [place] then please contact us. The fastest way to get your results is to register for My Chart.   IF you received an x-ray today, you will receive an invoice from [facility]. Please contact [facility] at [phone] with questions or concerns regarding your invoice.   IF you received labwork today, you will receive an invoice from [place]. Please contact [facility] at [phone] with questions or concerns regarding your invoice.   Our billing staff will not be able to assist you with questions regarding bills from these companies.  You will be contacted with the lab results as soon as they are available. The fastest way to get your results is to activate your My Chart account. Instructions are located on the last page of this paperwork. If you have not heard from [place] regarding the results in 2 weeks, please contact this office.     Cough, Adult  A cough helps to clear your throat and lungs. A cough may last only 2-3 weeks (acute), or it may last longer than 8 weeks (chronic). Many different things can cause a cough. A cough may be a sign of an illness or another medical condition. Follow these instructions at home:  Pay attention to any changes in your cough.  Take medicines only as told by your doctor. ? If you were prescribed an antibiotic medicine, take it as told by your doctor. Do not stop taking it even if you start to feel better. ? Talk with your doctor before you try using a cough medicine.  Drink enough fluid to keep your pee (urine) clear or pale yellow.  If the air is dry, use a cold steam vaporizer or humidifier in your home.  Stay away from things that make you cough at work or at home.  If your cough is worse at night, try using extra pillows to raise your head up higher while you sleep.  Do not smoke, and try not to  be around smoke. If you need help quitting, ask your doctor.  Do not have caffeine.  Do not drink alcohol.  Rest as needed. Contact a doctor if:  You have new problems (symptoms).  You cough up yellow fluid (pus).  Your cough does not get better after 2-3 weeks, or your cough gets worse.  Medicine does not help your cough and you are not sleeping well.  You have pain that gets worse or pain that is not helped with medicine.  You have a fever.  You are losing weight and you do not know why.  You have night sweats. Get help right away if:  You cough up blood.  You have trouble breathing.  Your heartbeat is very fast. This information is not intended to replace advice given to you by your health care provider. Make sure you discuss any questions you have with your health care provider. Document Released: 02/07/2011 Document Revised: 11/02/2015 Document Reviewed: 08/03/2014 Elsevier Interactive Patient Education  2019 [employer].  Rehydration, Adult Rehydration is the replacement of body fluids and salts and minerals (electrolytes) that are lost during dehydration. Dehydration is when there is not enough fluid or water in the body. This happens when you lose more fluids than you take in. Common causes of dehydration include:  Vomiting.  Diarrhea.  Excessive sweating, such as from heat exposure  or exercise.  Taking medicines that cause the body to lose excess fluid (diuretics).  Impaired kidney function.  Not drinking enough fluid.  Certain illnesses or infections.  Certain poorly controlled long-term (chronic) illnesses, such as diabetes, heart disease, and kidney disease.  Symptoms of mild dehydration may include thirst, dry lips and mouth, dry skin, and dizziness. Symptoms of severe dehydration may include increased heart rate, confusion, fainting, and not urinating. You can rehydrate by drinking certain fluids or getting fluids through an IV tube, as told by  your health care provider. What are the risks? Generally, rehydration is safe. However, one problem that can happen is taking in too much fluid (overhydration). This is rare. If overhydration happens, it can cause an electrolyte imbalance, kidney failure, or a decrease in salt (sodium) levels in the body. How to rehydrate Follow instructions from your health care provider for rehydration. The kind of fluid you should drink and the amount you should drink depend on your condition.  If directed by your health care provider, drink an oral rehydration solution (ORS). This is a drink designed to treat dehydration that is found in pharmacies and retail stores. ? Make an ORS by following instructions on the package. ? Start by drinking small amounts, about  cup (120 mL) every 5-10 minutes. ? Slowly increase how much you drink until you have taken the amount recommended by your health care provider.  Drink enough clear fluids to keep your urine clear or pale yellow. If you were instructed to drink an ORS, finish the ORS first, then start slowly drinking other clear fluids. Drink fluids such as: ? Water. Do not drink only water. Doing that can lead to having too little sodium in your body (hyponatremia). ? Ice chips. ? Fruit juice that you have added water to (diluted juice). ? Low-calorie sports drinks.  If you are severely dehydrated, your health care provider may recommend that you receive fluids through an IV tube in the hospital.  Do not take sodium tablets. Doing that can lead to the condition of having too much sodium in your body (hypernatremia). Eating while you rehydrate Follow instructions from your health care provider about what to eat while you rehydrate. Your health care provider may recommend that you slowly begin eating regular foods in small amounts.  Eat foods that contain a healthy balance of electrolytes, such as bananas, oranges, potatoes, tomatoes, and spinach.  Avoid foods  that are greasy or contain a lot of fat or sugar.  In some cases, you may get nutrition through a feeding tube that is passed through your nose and into your stomach (nasogastric tube, or NG tube). This may be done if you have uncontrolled vomiting or diarrhea. Beverages to avoid Certain beverages may make dehydration worse. While you rehydrate, avoid:  Alcohol.  Caffeine.  Drinks that contain a lot of sugar. These include: ? High-calorie sports drinks. ? Fruit juice that is not diluted. ? Soda.  Check nutrition labels to see how much sugar or caffeine a beverage contains. Signs of dehydration recovery You may be recovering from dehydration if:  You are urinating more often than before you started rehydrating.  Your urine is clear or pale yellow.  Your energy level improves.  You vomit less frequently.  You have diarrhea less frequently.  Your appetite improves or returns to normal.  You feel less dizzy or less light-headed.  Your skin tone and color start to look more normal. Contact a health care provider if:  You continue to have symptoms of mild dehydration, such as: ? Thirst. ? Dry lips. ? Slightly dry mouth. ? Dry, warm skin. ? Dizziness.  You continue to vomit or have diarrhea. Get help right away if:  You have symptoms of dehydration that get worse.  You feel: ? Confused. ? Weak. ? Like you are going to faint.  You have not urinated in 6-8 hours.  You have very dark urine.  You have trouble breathing.  Your heart rate while sitting still is over 100 beats a minute.  You cannot drink fluids without vomiting.  You have vomiting or diarrhea that: ? Gets worse. ? Does not go away.  You have a fever. This information is not intended to replace advice given to you by your health care provider. Make sure you discuss any questions you have with your health care provider. Document Released: 08/19/2011 Document Revised: 12/15/2015 Document Reviewed:  07/21/2015 Elsevier Interactive Patient Education  2019 [employer].  Viral Illness, Adult Viruses are tiny germs that can get into a person's body and cause illness. There are many different types of viruses, and they cause many types of illness. Viral illnesses can range from mild to severe. They can affect various parts of the body. Common illnesses that are caused by a virus include colds and the flu. Viral illnesses also include serious conditions such as HIV/AIDS (human immunodeficiency virus/acquired immunodeficiency syndrome). A few viruses have been linked to certain cancers. What are the causes? Many types of viruses can cause illness. Viruses invade cells in your body, multiply, and cause the infected cells to malfunction or die. When the cell dies, it releases more of the virus. When this happens, you develop symptoms of the illness, and the virus continues to spread to other cells. If the virus takes over the function of the cell, it can cause the cell to divide and grow out of control, as is the case when a virus causes cancer. Different viruses get into the body in different ways. You can get a virus by:  Swallowing food or water that is contaminated with the virus.  Breathing in droplets that have been coughed or sneezed into the air by an infected person.  Touching a surface that has been contaminated with the virus and then touching your eyes, nose, or mouth.  Being bitten by an insect or animal that carries the virus.  Having sexual contact with a person who is infected with the virus.  Being exposed to blood or fluids that contain the virus, either through an open cut or during a transfusion. If a virus enters your body, your body's defense system (immune system) will try to fight the virus. You may be at higher risk for a viral illness if your immune system is weak. What are the signs or symptoms? Symptoms vary depending on the type of virus and the location of the cells  that it invades. Common symptoms of the main types of viral illnesses include: Cold and flu viruses  Fever.  Headache.  Sore throat.  Muscle aches.  Nasal congestion.  Cough. Digestive system (gastrointestinal) viruses  Fever.  Abdominal pain.  Nausea.  Diarrhea. Liver viruses (hepatitis)  Loss of appetite.  Tiredness.  Yellowing of the skin (jaundice). Brain and spinal cord viruses  Fever.  Headache.  Stiff neck.  Nausea and vomiting.  Confusion or sleepiness. Skin viruses  Warts.  Itching.  Rash. Sexually transmitted viruses  Discharge.  Swelling.  Redness.  Rash. How is  this treated? Viruses can be difficult to treat because they live within cells. Antibiotic medicines do not treat viruses because these drugs do not get inside cells. Treatment for a viral illness may include:  Resting and drinking plenty of fluids.  Medicines to relieve symptoms. These can include over-the-counter medicine for pain and fever, medicines for cough or congestion, and medicines to relieve diarrhea.  Antiviral medicines. These drugs are available only for certain types of viruses. They may help reduce flu symptoms if taken early. There are also many antiviral medicines for hepatitis and HIV/AIDS. Some viral illnesses can be prevented with vaccinations. A common example is the flu shot. Follow these instructions at home: Medicines   Take over-the-counter and prescription medicines only as told by your health care provider.  If you were prescribed an antiviral medicine, take it as told by your health care provider. Do not stop taking the medicine even if you start to feel better.  Be aware of when antibiotics are needed and when they are not needed. Antibiotics do not treat viruses. If your health care provider thinks that you may have a bacterial infection as well as a viral infection, you may get an antibiotic. ? Do not ask for an antibiotic prescription if you  have been diagnosed with a viral illness. That will not make your illness go away faster. ? Frequently taking antibiotics when they are not needed can lead to antibiotic resistance. When this develops, the medicine no longer works against the bacteria that it normally fights. General instructions  Drink enough fluids to keep your urine clear or pale yellow.  Rest as much as possible.  Return to your normal activities as told by your health care provider. Ask your health care provider what activities are safe for you.  Keep all follow-up visits as told by your health care provider. This is important. How is this prevented? Take these actions to reduce your risk of viral infection:  Eat a healthy diet and get enough rest.  Wash your hands often with soap and water. This is especially important when you are in public places. If soap and water are not available, use hand sanitizer.  Avoid close contact with friends and family who have a viral illness.  If you travel to areas where viral gastrointestinal infection is common, avoid drinking water or eating raw food.  Keep your immunizations up to date. Get a flu shot every year as told by your health care provider.  Do not share toothbrushes, nail clippers, razors, or needles with other people.  Always practice safe sex.  Contact a health care provider if:  You have symptoms of a viral illness that do not go away.  Your symptoms come back after going away.  Your symptoms get worse. Get help right away if:  You have trouble breathing.  You have a severe headache or a stiff neck.  You have severe vomiting or abdominal pain. This information is not intended to replace advice given to you by your health care provider. Make sure you discuss any questions you have with your health care provider. Document Released: 10/06/2015 Document Revised: 11/08/2015 Document Reviewed: 10/06/2015 Elsevier Interactive Patient Education  2019  [employer]. Ibuprofen tablets and capsules What is this medicine? IBUPROFEN (eye BYOO proe fen) is a non-steroidal anti-inflammatory drug (NSAID). It is used for dental pain, fever, headaches or migraines, osteoarthritis, rheumatoid arthritis, or painful monthly periods. It can also relieve minor aches and pains caused by a cold, flu,  or sore throat. This medicine may be used for other purposes; ask your health care provider or pharmacist if you have questions. COMMON BRAND NAME(S): Advil, Advil Junior Strength, Advil Migraine, Genpril, Ibren, IBU, Midol, Midol Cramps and Body Aches, Motrin, Motrin IB, Motrin Junior Strength, Motrin Migraine Pain, Samson-8, Toxicology Saliva Collection What should I tell my health care provider before I take this medicine? They need to know if you have any of these conditions: -cigarette smoker -coronary artery bypass graft (CABG) surgery within the past 2 weeks -drink more than 3 alcohol-containing drinks a day -heart disease -high blood pressure -history of stomach bleeding -kidney disease -liver disease -lung or breathing disease, like asthma -an unusual or allergic reaction to ibuprofen, aspirin, other NSAIDs, other medicines, foods, dyes, or preservatives -pregnant or trying to get pregnant -breast-feeding How should I use this medicine? Take this medicine by mouth with a glass of water. Follow the directions on the prescription label. Take this medicine with food if your stomach gets upset. Try to not lie down for at least 10 minutes after you take the medicine. Take your medicine at regular intervals. Do not take your medicine more often than directed. A special MedGuide will be given to you by the pharmacist with each prescription and refill. Be sure to read this information carefully each time. Talk to your pediatrician regarding the use of this medicine in children. Special care may be needed. Overdosage: If you think you have taken too much of  this medicine contact a poison control center or emergency room at once. NOTE: This medicine is only for you. Do not share this medicine with others. What if I miss a dose? If you miss a dose, take it as soon as you can. If it is almost time for your next dose, take only that dose. Do not take double or extra doses. What may interact with this medicine? Do not take this medicine with any of the following medications: -cidofovir -ketorolac -methotrexate -pemetrexed This medicine may also interact with the following medications: -alcohol -aspirin -diuretics -lithium -other drugs for inflammation like prednisone -warfarin This list may not describe all possible interactions. Give your health care provider a list of all the medicines, herbs, non-prescription drugs, or dietary supplements you use. Also tell them if you smoke, drink alcohol, or use illegal drugs. Some items may interact with your medicine. What should I watch for while using this medicine? Tell your doctor or healthcare professional if your symptoms do not start to get better or if they get worse. This medicine does not prevent heart attack or stroke. In fact, this medicine may increase the chance of a heart attack or stroke. The chance may increase with longer use of this medicine and in people who have heart disease. If you take aspirin to prevent heart attack or stroke, talk with your doctor or health care professional. Do not take other medicines that contain aspirin, ibuprofen, or naproxen with this medicine. Side effects such as stomach upset, nausea, or ulcers may be more likely to occur. Many medicines available without a prescription should not be taken with this medicine. This medicine can cause ulcers and bleeding in the stomach and intestines at any time during treatment. Ulcers and bleeding can happen without warning symptoms and can cause death. To reduce your risk, do not smoke cigarettes or drink alcohol while you are  taking this medicine. You may get drowsy or dizzy. Do not drive, use machinery, or do anything that needs mental  alertness until you know how this medicine affects you. Do not stand or sit up quickly, especially if you are an older patient. This reduces the risk of dizzy or fainting spells. This medicine can cause you to bleed more easily. Try to avoid damage to your teeth and gums when you brush or floss your teeth. This medicine may be used to treat migraines. If you take migraine medicines for 10 or more days a month, your migraines may get worse. Keep a diary of headache days and medicine use. Contact your healthcare professional if your migraine attacks occur more frequently. What side effects may I notice from receiving this medicine? Side effects that you should report to your doctor or health care professional as soon as possible: -allergic reactions like skin rash, itching or hives, swelling of the face, lips, or tongue -severe stomach pain -signs and symptoms of bleeding such as bloody or black, tarry stools; red or dark-brown urine; spitting up blood or brown material that looks like coffee grounds; red spots on the skin; unusual bruising or bleeding from the eye, gums, or nose -signs and symptoms of a blood clot such as changes in vision; chest pain; severe, sudden headache; trouble speaking; sudden numbness or weakness of the face, arm, or leg -unexplained weight gain or swelling -unusually weak or tired -yellowing of eyes or skin Side effects that usually do not require medical attention (report to your doctor or health care professional if they continue or are bothersome): -bruising -diarrhea -dizziness, drowsiness -headache -nausea, vomiting This list may not describe all possible side effects. Call your doctor for medical advice about side effects. You may report side effects to FDA at 1-800-FDA-1088. Where should I keep my medicine? Keep out of the reach of children. Store at  room temperature between 15 and 30 degrees C (59 and 86 degrees F). Keep container tightly closed. Throw away any unused medicine after the expiration date. NOTE: This sheet is a summary. It may not cover all possible information. If you have questions about this medicine, talk to your doctor, pharmacist, or health care provider.  2019 Elsevier/Gold Standard (2017-01-29 12:43:57)

## 2018-08-02 ENCOUNTER — Other Ambulatory Visit: Payer: Self-pay | Admitting: Emergency Medicine

## 2018-08-03 NOTE — Telephone Encounter (Signed)
Requested medication (s) are due for refill today: yes  Requested medication (s) are on the active medication list: yes  Last refill:  11/28/17 #20  Future visit scheduled: yes  Notes to clinic:  Medication not delegated to NT to refill   Requested Prescriptions  Pending Prescriptions Disp Refills   ALPRAZolam (XANAX) 1 MG tablet [Pharmacy Med Name: ALPRAZOLAM 1 MG TABLET] 20 tablet     Sig: TAKE 1 TABLET BY MOUTH EVERY DAY AT BEDTIME AS NEEDED     Not Delegated - Psychiatry:  Anxiolytics/Hypnotics Failed - 08/02/2018  8:54 AM      Failed - This refill cannot be delegated      Failed - Urine Drug Screen completed in last 360 days.      Passed - Valid encounter within last 6 months    Recent Outpatient Visits          4 days ago Flu-like symptoms   [facility] at [facility], Ellie Lunch, [place]   1 month ago Yeast dermatitis   [facility] at [facility], Ines Bloomer, [place]   5 months ago Essential hypertension   [facility] at [facility], Ines Bloomer, [place]   11 months ago Essential hypertension   [facility] at [facility], Ines Bloomer, [place]   1 year ago Cough   Primary Care at [facility], Ines Bloomer, [place]      Future Appointments            In 4 weeks Sagardia, Ines Bloomer, MD [facility] at [place], [facility]

## 2018-08-06 NOTE — Telephone Encounter (Signed)
Patient is requesting a refill of the following medications: Requested Prescriptions   Pending Prescriptions Disp Refills  . ALPRAZolam (XANAX) 1 MG tablet [Pharmacy Med Name: ALPRAZOLAM 1 MG TABLET] 20 tablet     Sig: TAKE 1 TABLET BY MOUTH EVERY DAY AT BEDTIME AS NEEDED    Date of patient request: 08/01/2018 Last office visit: 07/30/2018 Date of last refill: 11/28/2017 Last refill amount: 20 Follow up time period per chart:

## 2018-08-10 ENCOUNTER — Ambulatory Visit (INDEPENDENT_AMBULATORY_CARE_PROVIDER_SITE_OTHER): Payer: 59 | Admitting: Emergency Medicine

## 2018-08-10 ENCOUNTER — Other Ambulatory Visit: Payer: Self-pay

## 2018-08-10 ENCOUNTER — Encounter: Payer: Self-pay | Admitting: Emergency Medicine

## 2018-08-10 VITALS — BP 152/78 | HR 83 | Temp 98.7°F | Ht 74.0 in | Wt 319.2 lb

## 2018-08-10 DIAGNOSIS — R1030 Lower abdominal pain, unspecified: Secondary | ICD-10-CM | POA: Diagnosis not present

## 2018-08-10 DIAGNOSIS — K625 Hemorrhage of anus and rectum: Secondary | ICD-10-CM | POA: Diagnosis not present

## 2018-08-10 DIAGNOSIS — K5790 Diverticulosis of intestine, part unspecified, without perforation or abscess without bleeding: Secondary | ICD-10-CM

## 2018-08-10 LAB — POCT URINALYSIS DIP (MANUAL ENTRY)
BILIRUBIN UA: NEGATIVE
BILIRUBIN UA: NEGATIVE mg/dL
GLUCOSE UA: NEGATIVE mg/dL
Leukocytes, UA: NEGATIVE
Nitrite, UA: NEGATIVE
Protein Ur, POC: NEGATIVE mg/dL
SPEC GRAV UA: 1.025 (ref 1.010–1.025)
UROBILINOGEN UA: 0.2 U/dL
pH, UA: 5.5 (ref 5.0–8.0)

## 2018-08-10 LAB — POCT CBC
Granulocyte percent: 62.6 %G (ref 37–80)
HCT, POC: 37.9 % (ref 29–41)
HEMOGLOBIN: 12.4 g/dL (ref 11–14.6)
LYMPH, POC: 1.9 (ref 0.6–3.4)
MCH: 27.1 pg (ref 27–31.2)
MCHC: 32.7 g/dL (ref 31.8–35.4)
MCV: 82.9 fL (ref 76–111)
MID (cbc): 0.2 (ref 0–0.9)
MPV: 5.7 fL (ref 0–99.8)
PLATELET COUNT, POC: 251 10*3/uL (ref 142–424)
POC Granulocyte: 3.6 (ref 2–6.9)
POC LYMPH PERCENT: 33.9 %L (ref 10–50)
POC MID %: 3.5 % (ref 0–12)
RBC: 4.58 M/uL — AB (ref 4.69–6.13)
RDW, POC: 15.6 %
WBC: 5.7 10*3/uL (ref 4.6–10.2)

## 2018-08-10 NOTE — Patient Instructions (Addendum)
If you have lab work done today you will be contacted with your lab results within the next 2 weeks.  If you have not heard from [place] then please contact us. The fastest way to get your results is to register for My Chart.   IF you received an x-ray today, you will receive an invoice from [facility]. Please contact [facility] at [phone] with questions or concerns regarding your invoice.   IF you received labwork today, you will receive an invoice from [place]. Please contact [facility] at [phone] with questions or concerns regarding your invoice.   Our billing staff will not be able to assist you with questions regarding bills from these companies.  You will be contacted with the lab results as soon as they are available. The fastest way to get your results is to activate your My Chart account. Instructions are located on the last page of this paperwork. If you have not heard from [place] regarding the results in 2 weeks, please contact this office.     Diverticulosis  Diverticulosis is a condition that develops when small pouches (diverticula) form in the wall of the large intestine (colon). The colon is where water is absorbed and stool is formed. The pouches form when the inside layer of the colon pushes through weak spots in the outer layers of the colon. You may have a few pouches or many of them. What are the causes? The cause of this condition is not known. What increases the risk? The following factors may make you more likely to develop this condition:  Being older than age 38. Your risk for this condition increases with age. Diverticulosis is rare among people younger than age 34. By age 22, many people have it.  Eating a low-fiber diet.  Having frequent constipation.  Being overweight.  Not getting enough exercise.  Smoking.  Taking over-the-counter pain medicines, like aspirin and ibuprofen.  Having a family history of diverticulosis. What  are the signs or symptoms? In most people, there are no symptoms of this condition. If you do have symptoms, they may include:  Bloating.  Cramps in the abdomen.  Constipation or diarrhea.  Pain in the lower left side of the abdomen. How is this diagnosed? This condition is most often diagnosed during an exam for other colon problems. Because diverticulosis usually has no symptoms, it often cannot be diagnosed independently. This condition may be diagnosed by:  Using a flexible scope to examine the colon (colonoscopy).  Taking an X-ray of the colon after dye has been put into the colon (barium enema).  Doing a CT scan. How is this treated? You may not need treatment for this condition if you have never developed an infection related to diverticulosis. If you have had an infection before, treatment may include:  Eating a high-fiber diet. This may include eating more fruits, vegetables, and grains.  Taking a fiber supplement.  Taking a live bacteria supplement (probiotic).  Taking medicine to relax your colon.  Taking antibiotic medicines. Follow these instructions at home:  Drink 6-8 glasses of water or more each day to prevent constipation.  Try not to strain when you have a bowel movement.  If you have had an infection before: ? Eat more fiber as directed by your health care provider or your diet and nutrition specialist (dietitian). ? Take a fiber supplement or probiotic, if your health care provider approves.  Take over-the-counter and prescription medicines only as told by your health  care provider.  If you were prescribed an antibiotic, take it as told by your health care provider. Do not stop taking the antibiotic even if you start to feel better.  Keep all follow-up visits as told by your health care provider. This is important. Contact a health care provider if:  You have pain in your abdomen.  You have bloating.  You have cramps.  You have not had a bowel  movement in 3 days. Get help right away if:  Your pain gets worse.  Your bloating becomes very bad.  You have a fever or chills, and your symptoms suddenly get worse.  You vomit.  You have bowel movements that are bloody or black.  You have bleeding from your rectum. Summary  Diverticulosis is a condition that develops when small pouches (diverticula) form in the wall of the large intestine (colon).  You may have a few pouches or many of them.  This condition is most often diagnosed during an exam for other colon problems.  If you have had an infection related to diverticulosis, treatment may include increasing the fiber in your diet, taking supplements, or taking medicines. This information is not intended to replace advice given to you by your health care provider. Make sure you discuss any questions you have with your health care provider. Document Released: 02/22/2004 Document Revised: 04/15/2016 Document Reviewed: 04/15/2016 Elsevier Interactive Patient Education  2019 [employer].

## 2018-08-10 NOTE — Progress Notes (Signed)
[name] [name] 68 y.o.   Chief Complaint  Patient presents with  . Rectal Bleeding    says has had diverticultis in the past, has been bleeding frim the rectum since Thursday. Also having stomach pain which started Thurs.    HISTORY OF PRESENT ILLNESS: This is a 68 y.o. male with history of diverticulosis complaining of rectal bleeding and lower abdominal pain that started last Thursday, 4 days ago.  Feels much better today than then.  Describes amount of blood as small.  Able to eat and drink.  Denies nausea or vomiting.  Denies fever or chills.  Denies lightheadedness or syncope.  No urinary symptoms.  Lower abdominal pain gone now. Prostate MRI done last year showed the following: FINDINGS: Prostate:  -- Peripheral Zone: No focal abnormality seen on ADC and DWI sequences. No suspicious T2 hypointense nodules identified.  -- Transition/Central Zone: Diffuse involvement by multiple BPH nodules. No suspicious T2 hypointense nodules identified.  -- Volume:  6.6 by 4.8 x 6.9 cm (volume = 110 cm^3)  Transcapsular spread:  Absent  Seminal vesicle involvement:  Absent  Neurovascular bundle involvement:  Absent  Pelvic adenopathy: None visualized  Bone metastasis: None visualized  Other: Severe sigmoid diverticulosis noted, without evidence of diverticulitis.  IMPRESSION: No radiographic evidence of high-grade prostate carcinoma. PI-RADS 1: Very Low (clinically significant cancer is highly unlikely to be present)   Electronically Signed   By: Earle Gell M.D.   On: 09/22/2017 14:41 Severe sigmoid diverticulosis noted.  HPI   Prior to Admission medications   Medication Sig Start Date End Date Taking? Authorizing Provider  ALPRAZolam Duanne Moron) 1 MG tablet TAKE 1 TABLET BY MOUTH EVERY DAY AT BEDTIME AS NEEDED 08/06/18  Yes Nihal Doan, Ines Bloomer, MD  finasteride (PROPECIA) 1 MG tablet Take 1 mg by mouth daily.   Yes [provider]    lisinopril-hydrochlorothiazide (PRINZIDE,ZESTORETIC) 10-12.5 MG tablet Take 1 tablet by mouth daily. 03/03/18  Yes Louella Medaglia, Ines Bloomer, MD  methocarbamol (ROBAXIN) 500 MG tablet Take 1 tablet (500 mg total) 2 (two) times daily by mouth. 04/25/17  Yes Joy, Shawn C, PA-C    Allergies  Allergen Reactions  . Aspirin     REACTION: upsets stomach  . Statins     REACTION: UPSETS STOMACH  . Gadolinium Derivatives Nausea And Vomiting    Pt was given 20 ml multihance and became nauseated and vomited several times.     Patient Active Problem List   Diagnosis Date Noted  . Elevated PSA 08/29/2017  . Prostate enlargement 08/29/2017  . Lower respiratory infection 07/01/2017  . Gout 10/25/2014  . Cough 02/18/2012  . Hypertension   . BPH (benign prostatic hypertrophy)   . Kidney stones   . Prostate nodule     Past Medical History:  Diagnosis Date  . Allergy   . Anxiety   . Arthritis   . BPH (benign prostatic hypertrophy)   . Colon polyp    diverticular bleed with transfusion  . Hyperlipidemia   . Hypertension   . Kidney stones    15-20  . Obesity, unspecified   . Prostate nodule [id number]    Past Surgical History:  Procedure Laterality Date  . ANKLE FUSION    . HERNIA REPAIR    . JOINT REPLACEMENT    . TRANSURETHRAL RESECTION OF PROSTATE      Social History   Socioeconomic History  . Marital status: Married    Spouse name: Not on file  . Number of children: 2  .  Years of education: Not on file  . Highest education level: Not on file  Occupational History  . Not on file  Social Needs  . Financial resource strain: Not on file  . Food insecurity:    Worry: Not on file    Inability: Not on file  . Transportation needs:    Medical: Not on file    Non-medical: Not on file  Tobacco Use  . Smoking status: Never Smoker  . Smokeless tobacco: Never Used  Substance and Sexual Activity  . Alcohol use: No  . Drug use: No  . Sexual activity: Yes  Lifestyle  . Physical  activity:    Days per week: Not on file    Minutes per session: Not on file  . Stress: Not on file  Relationships  . Social connections:    Talks on phone: Not on file    Gets together: Not on file    Attends religious service: Not on file    Active member of club or organization: Not on file    Attends meetings of clubs or organizations: Not on file    Relationship status: Not on file  . Intimate partner violence:    Fear of current or ex partner: Not on file    Emotionally abused: Not on file    Physically abused: Not on file    Forced sexual activity: Not on file  Other Topics Concern  . Not on file  Social History Narrative  . Not on file    Family History  Problem Relation Age of Onset  . Diabetes Mother   . Prostate cancer Other   . Diabetes Other      Review of Systems  Constitutional: Negative.  Negative for chills and fever.  HENT: Negative.   Eyes: Negative.   Respiratory: Negative.  Negative for cough and shortness of breath.   Cardiovascular: Negative.  Negative for chest pain and palpitations.  Gastrointestinal: Positive for abdominal pain and blood in stool. Negative for constipation, diarrhea, heartburn, melena, nausea and vomiting.  Genitourinary: Negative.  Negative for dysuria and hematuria.  Musculoskeletal: Negative.   Skin: Negative.  Negative for rash.  Neurological: Negative.  Negative for dizziness and headaches.  Endo/Heme/Allergies: Negative.   All other systems reviewed and are negative.  Vitals:   08/10/18 1636  BP: (!) 152/78  Pulse: 83  Temp: 98.7 F (37.1 C)  SpO2: 98%   Orthostatic VS for the past 24 hrs (Last 3 readings):  BP- Lying Pulse- Lying BP- Sitting Pulse- Sitting BP- Standing at 0 minutes Pulse- Standing at 0 minutes  08/10/18 1659 132/79 79 130/83 80 145/88 85     Physical Exam Vitals signs reviewed.  Constitutional:      Appearance: Normal appearance.  HENT:     Head: Normocephalic and atraumatic.      Mouth/Throat:     Mouth: Mucous membranes are moist.     Pharynx: Oropharynx is clear.  Eyes:     Extraocular Movements: Extraocular movements intact.     Conjunctiva/sclera: Conjunctivae normal.     Pupils: Pupils are equal, round, and reactive to light.  Neck:     Musculoskeletal: Normal range of motion and neck supple.  Cardiovascular:     Rate and Rhythm: Normal rate and regular rhythm.     Heart sounds: Normal heart sounds.  Pulmonary:     Effort: Pulmonary effort is normal.     Breath sounds: Normal breath sounds.  Abdominal:  General: There is no distension.     Palpations: Abdomen is soft.     Tenderness: There is no abdominal tenderness.  Musculoskeletal: Normal range of motion.  Skin:    General: Skin is warm and dry.     Capillary Refill: Capillary refill takes less than 2 seconds.  Neurological:     General: No focal deficit present.     Mental Status: He is alert and oriented to person, place, and time.  Psychiatric:        Mood and Affect: Mood normal.        Behavior: Behavior normal.     Results for orders placed or performed in visit on 08/10/18 (from the past 24 hour(s))  POCT urinalysis dipstick     Status: Abnormal   Collection Time: 08/10/18  4:53 PM  Result Value Ref Range   Color, UA yellow yellow   Clarity, UA clear clear   Glucose, UA negative negative mg/dL   Bilirubin, UA negative negative   Ketones, POC UA negative negative mg/dL   Spec Grav, UA 1.025 1.010 - 1.025   Blood, UA small (A) negative   pH, UA 5.5 5.0 - 8.0   Protein Ur, POC negative negative mg/dL   Urobilinogen, UA 0.2 0.2 or 1.0 E.U./dL   Nitrite, UA Negative Negative   Leukocytes, UA Negative Negative  POCT CBC     Status: Abnormal   Collection Time: 08/10/18  5:07 PM  Result Value Ref Range   WBC 5.7 4.6 - 10.2 K/uL   Lymph, poc 1.9 0.6 - 3.4   POC LYMPH PERCENT 33.9 10 - 50 %L   MID (cbc) 0.2 0 - 0.9   POC MID % 3.5 0 - 12 %M   POC Granulocyte 3.6 2 - 6.9    Granulocyte percent 62.6 37 - 80 %G   RBC 4.58 (A) 4.69 - 6.13 M/uL   Hemoglobin 12.4 11 - 14.6 g/dL   HCT, POC 37.9 29 - 41 %   MCV 82.9 76 - 111 fL   MCH, POC 27.1 27 - 31.2 pg   MCHC 32.7 31.8 - 35.4 g/dL   RDW, POC 15.6 %   Platelet Count, POC 251 142 - 424 K/uL   MPV 5.7 0 - 99.8 fL    ASSESSMENT & PLAN: Miranda was seen today for rectal bleeding.  Diagnoses and all orders for this visit:  Lower abdominal pain -     POCT urinalysis dipstick  Rectal bleeding -     POCT CBC -     Comprehensive metabolic panel -     Orthostatic vital signs  Diverticulosis    Patient Instructions       If you have lab work done today you will be contacted with your lab results within the next 2 weeks.  If you have not heard from [place] then please contact us. The fastest way to get your results is to register for My Chart.   IF you received an x-ray today, you will receive an invoice from [facility]. Please contact [facility] at [phone] with questions or concerns regarding your invoice.   IF you received labwork today, you will receive an invoice from [place]. Please contact [facility] at [phone] with questions or concerns regarding your invoice.   Our billing staff will not be able to assist you with questions regarding bills from these companies.  You will be contacted with the lab results as soon as they are available. The fastest way  to get your results is to activate your My Chart account. Instructions are located on the last page of this paperwork. If you have not heard from [place] regarding the results in 2 weeks, please contact this office.     Diverticulosis  Diverticulosis is a condition that develops when small pouches (diverticula) form in the wall of the large intestine (colon). The colon is where water is absorbed and stool is formed. The pouches form when the inside layer of the colon pushes through weak spots in the outer layers of the colon. You  may have a few pouches or many of them. What are the causes? The cause of this condition is not known. What increases the risk? The following factors may make you more likely to develop this condition:  Being older than age 76. Your risk for this condition increases with age. Diverticulosis is rare among people younger than age 74. By age 62, many people have it.  Eating a low-fiber diet.  Having frequent constipation.  Being overweight.  Not getting enough exercise.  Smoking.  Taking over-the-counter pain medicines, like aspirin and ibuprofen.  Having a family history of diverticulosis. What are the signs or symptoms? In most people, there are no symptoms of this condition. If you do have symptoms, they may include:  Bloating.  Cramps in the abdomen.  Constipation or diarrhea.  Pain in the lower left side of the abdomen. How is this diagnosed? This condition is most often diagnosed during an exam for other colon problems. Because diverticulosis usually has no symptoms, it often cannot be diagnosed independently. This condition may be diagnosed by:  Using a flexible scope to examine the colon (colonoscopy).  Taking an X-ray of the colon after dye has been put into the colon (barium enema).  Doing a CT scan. How is this treated? You may not need treatment for this condition if you have never developed an infection related to diverticulosis. If you have had an infection before, treatment may include:  Eating a high-fiber diet. This may include eating more fruits, vegetables, and grains.  Taking a fiber supplement.  Taking a live bacteria supplement (probiotic).  Taking medicine to relax your colon.  Taking antibiotic medicines. Follow these instructions at home:  Drink 6-8 glasses of water or more each day to prevent constipation.  Try not to strain when you have a bowel movement.  If you have had an infection before: ? Eat more fiber as directed by your health  care provider or your diet and nutrition specialist (dietitian). ? Take a fiber supplement or probiotic, if your health care provider approves.  Take over-the-counter and prescription medicines only as told by your health care provider.  If you were prescribed an antibiotic, take it as told by your health care provider. Do not stop taking the antibiotic even if you start to feel better.  Keep all follow-up visits as told by your health care provider. This is important. Contact a health care provider if:  You have pain in your abdomen.  You have bloating.  You have cramps.  You have not had a bowel movement in 3 days. Get help right away if:  Your pain gets worse.  Your bloating becomes very bad.  You have a fever or chills, and your symptoms suddenly get worse.  You vomit.  You have bowel movements that are bloody or black.  You have bleeding from your rectum. Summary  Diverticulosis is a condition that develops when small pouches (diverticula)  form in the wall of the large intestine (colon).  You may have a few pouches or many of them.  This condition is most often diagnosed during an exam for other colon problems.  If you have had an infection related to diverticulosis, treatment may include increasing the fiber in your diet, taking supplements, or taking medicines. This information is not intended to replace advice given to you by your health care provider. Make sure you discuss any questions you have with your health care provider. Document Released: 02/22/2004 Document Revised: 04/15/2016 Document Reviewed: 04/15/2016 Elsevier Interactive Patient Education  2019 [employer]      [name], MD [facility]

## 2018-08-11 ENCOUNTER — Encounter: Payer: Self-pay | Admitting: Radiology

## 2018-08-11 LAB — COMPREHENSIVE METABOLIC PANEL
A/G RATIO: 1.4 (ref 1.2–2.2)
ALT: 21 IU/L (ref 0–44)
AST: 19 IU/L (ref 0–40)
Albumin: 4 g/dL (ref 3.8–4.8)
Alkaline Phosphatase: 62 IU/L (ref 39–117)
BUN/Creatinine Ratio: 11 (ref 10–24)
BUN: 14 mg/dL (ref 8–27)
Bilirubin Total: <0.2 mg/dL (ref 0.0–1.2)
CALCIUM: 9.5 mg/dL (ref 8.6–10.2)
CO2: 22 mmol/L (ref 20–29)
Chloride: 104 mmol/L (ref 96–106)
Creatinine, Ser: 1.27 mg/dL (ref 0.76–1.27)
GFR calc Af Amer: 68 mL/min/{1.73_m2} (ref >59)
GFR, EST NON AFRICAN AMERICAN: 58 mL/min/{1.73_m2} — AB (ref >59)
Globulin, Total: 2.9 g/dL (ref 1.5–4.5)
Glucose: 104 mg/dL — ABNORMAL HIGH (ref 65–99)
POTASSIUM: 3.6 mmol/L (ref 3.5–5.2)
SODIUM: 141 mmol/L (ref 134–144)
Total Protein: 6.9 g/dL (ref 6.0–8.5)

## 2018-08-31 ENCOUNTER — Other Ambulatory Visit: Payer: Self-pay

## 2018-08-31 ENCOUNTER — Ambulatory Visit (INDEPENDENT_AMBULATORY_CARE_PROVIDER_SITE_OTHER): Payer: 59 | Admitting: Emergency Medicine

## 2018-08-31 DIAGNOSIS — I10 Essential (primary) hypertension: Secondary | ICD-10-CM

## 2018-08-31 DIAGNOSIS — K625 Hemorrhage of anus and rectum: Secondary | ICD-10-CM

## 2018-08-31 DIAGNOSIS — R7303 Prediabetes: Secondary | ICD-10-CM

## 2018-09-01 ENCOUNTER — Other Ambulatory Visit: Payer: Self-pay

## 2018-09-01 ENCOUNTER — Telehealth (INDEPENDENT_AMBULATORY_CARE_PROVIDER_SITE_OTHER): Payer: 59 | Admitting: Emergency Medicine

## 2018-09-01 DIAGNOSIS — E785 Hyperlipidemia, unspecified: Secondary | ICD-10-CM | POA: Diagnosis not present

## 2018-09-01 DIAGNOSIS — I10 Essential (primary) hypertension: Secondary | ICD-10-CM | POA: Diagnosis not present

## 2018-09-01 DIAGNOSIS — R7303 Prediabetes: Secondary | ICD-10-CM | POA: Diagnosis not present

## 2018-09-01 LAB — CBC WITH DIFFERENTIAL/PLATELET
Basophils Absolute: 0 10*3/uL (ref 0.0–0.2)
Basos: 1 %
EOS (ABSOLUTE): 0.2 10*3/uL (ref 0.0–0.4)
EOS: 3 %
Hematocrit: 38.4 % (ref 37.5–51.0)
Hemoglobin: 12.5 g/dL — ABNORMAL LOW (ref 13.0–17.7)
IMMATURE GRANULOCYTES: 0 %
Immature Grans (Abs): 0 10*3/uL (ref 0.0–0.1)
LYMPHS ABS: 1.7 10*3/uL (ref 0.7–3.1)
LYMPHS: 33 %
MCH: 26.4 pg — AB (ref 26.6–33.0)
MCHC: 32.6 g/dL (ref 31.5–35.7)
MCV: 81 fL (ref 79–97)
MONOCYTES: 7 %
Monocytes Absolute: 0.4 10*3/uL (ref 0.1–0.9)
Neutrophils Absolute: 3 10*3/uL (ref 1.4–7.0)
Neutrophils: 56 %
PLATELETS: 204 10*3/uL (ref 150–450)
RBC: 4.73 x10E6/uL (ref 4.14–5.80)
RDW: 14.5 % (ref 11.6–15.4)
WBC: 5.3 10*3/uL (ref 3.4–10.8)

## 2018-09-01 LAB — LIPID PANEL
CHOLESTEROL TOTAL: 196 mg/dL (ref 100–199)
Chol/HDL Ratio: 6.1 ratio — ABNORMAL HIGH (ref 0.0–5.0)
HDL: 32 mg/dL — AB (ref >39)
LDL Calculated: 113 mg/dL — ABNORMAL HIGH (ref 0–99)
Triglycerides: 256 mg/dL — ABNORMAL HIGH (ref 0–149)
VLDL Cholesterol Cal: 51 mg/dL — ABNORMAL HIGH (ref 5–40)

## 2018-09-01 LAB — HEMOGLOBIN A1C
ESTIMATED AVERAGE GLUCOSE: 134 mg/dL
HEMOGLOBIN A1C: 6.3 % — AB (ref 4.8–5.6)

## 2018-09-01 MED ORDER — ROSUVASTATIN CALCIUM 10 MG PO TABS: 10.0000 mg | ORAL_TABLET | Freq: Every day | ORAL | 3 refills | Status: DC

## 2018-09-01 NOTE — Progress Notes (Signed)
Telemedicine Encounter- SOAP NOTE Established Patient This telephone encounter was conducted with the patient's (or proxy's) verbal consent via audio telecommunications: yes/no: Yes Patient was instructed to have this encounter in a suitably private space; and to only have persons present to whom they give permission to participate. In addition, patient identity was confirmed by use of name plus two identifiers (DOB and address). I spent a total of TIME; 0 MIN TO 60 MIN: 15 minutes talking with the patient or their proxy. No chief complaint on file.  Subjective  [name] [name] is a 68 y.o. @GENDER @ established patient. Telephone visit today for follow-up of hypertension and labs done yesterday.  Labs show abnormal lipid profile with increase triglycerides and increase LDL.  Patient states statins give him some GI side effects but no allergic reaction such as hives.  Prediabetes.  Patient has no complaints.  He is doing well.  Seen by me several weeks ago with rectal bleeding which has subsided.  Normal CBC.  Patient takes his own blood pressure at home systolics have been between 150 and 170 with normal diastolics in the 60F and 09N.  All labs reviewed with patient. HPI ? Patient Active Problem List   Diagnosis Date Noted  . Elevated PSA 08/29/2017  . Prostate enlargement 08/29/2017  . Lower respiratory infection 07/01/2017  . Gout 10/25/2014  . Cough 02/18/2012  . Hypertension   . BPH (benign prostatic hypertrophy)   . Kidney stones   . Prostate nodule    Past Medical History:  Diagnosis Date  . Allergy   . Anxiety   . Arthritis   . BPH (benign prostatic hypertrophy)   . Colon polyp    diverticular bleed with transfusion  . Hyperlipidemia   . Hypertension   . Kidney stones    15-20  . Obesity, unspecified   . Prostate nodule [id number]   Current Outpatient Medications  Medication Sig Dispense Refill  . ALPRAZolam (XANAX) 1 MG tablet TAKE 1 TABLET BY MOUTH EVERY DAY AT BEDTIME  AS NEEDED 20 tablet 0  . finasteride (PROPECIA) 1 MG tablet Take 1 mg by mouth daily.    [name] Kitchen lisinopril-hydrochlorothiazide (PRINZIDE,ZESTORETIC) 10-12.5 MG tablet Take 1 tablet by mouth daily. 90 tablet 3  . methocarbamol (ROBAXIN) 500 MG tablet Take 1 tablet (500 mg total) 2 (two) times daily by mouth. 20 tablet 0   No current facility-administered medications for this visit.    Allergies  Allergen Reactions  . Aspirin     REACTION: upsets stomach  . Statins     REACTION: UPSETS STOMACH  . Gadolinium Derivatives Nausea And Vomiting    Pt was given 20 ml multihance and became nauseated and vomited several times.    Social History   Socioeconomic History  . Marital status: Married    Spouse name: Not on file  . Number of children: 2  . Years of education: Not on file  . Highest education level: Not on file  Occupational History  . Not on file  Social Needs  . Financial resource strain: Not on file  . Food insecurity:    Worry: Not on file    Inability: Not on file  . Transportation needs:    Medical: Not on file    Non-medical: Not on file  Tobacco Use  . Smoking status: Never Smoker  . Smokeless tobacco: Never Used  Substance and Sexual Activity  . Alcohol use: No  . Drug use: No  . Sexual activity: Yes  Lifestyle  .  Physical activity:    Days per week: Not on file    Minutes per session: Not on file  . Stress: Not on file  Relationships  . Social connections:    Talks on phone: Not on file    Gets together: Not on file    Attends religious service: Not on file    Active member of club or organization: Not on file    Attends meetings of clubs or organizations: Not on file    Relationship status: Not on file  . Intimate partner violence:    Fear of current or ex partner: Not on file    Emotionally abused: Not on file    Physically abused: Not on file    Forced sexual activity: Not on file  Other Topics Concern  . Not on file  Social History Narrative  . Not  on file   ROS unremarkable and noncontributory. Objective  Vitals as reported by the patient: Blood pressure: Systolics between 250 and 539 and diastolics between 70 and 80. There were no vitals filed for this visit. There are no diagnoses linked to this encounter.  Assessment and plan. Essential hypertension.  Continue present medication. Dyslipidemia.  Will start Crestor 10 mg a day. Prediabetes.  Advised him diet and nutrition. Follow-up in 3 to 6 months in the office.  I discussed the assessment and treatment plan with the patient. The patient was provided an opportunity to ask questions and all were answered. The patient agreed with the plan and demonstrated an understanding of the instructions.   The patient was advised to call back or seek an in-person evaluation if the symptoms worsen or if the condition fails to improve as anticipated.  I provided 15 minutes of non-face-to-face time during this encounter.

## 2018-09-01 NOTE — Progress Notes (Signed)
Diagnoses and all orders for this visit:  Hypertension, unspecified type  Dyslipidemia -     rosuvastatin (CRESTOR) 10 MG tablet; Take 1 tablet (10 mg total) by mouth daily.  Prediabetes

## 2018-11-13 ENCOUNTER — Other Ambulatory Visit: Payer: Self-pay | Admitting: Emergency Medicine

## 2018-11-13 NOTE — Telephone Encounter (Signed)
Patient is requesting a refill of the following medications: Requested Prescriptions   Pending Prescriptions Disp Refills  . ALPRAZolam (XANAX) 1 MG tablet [Pharmacy Med Name: ALPRAZOLAM 1 MG TABLET] 20 tablet 0    Sig: TAKE 1 TABLET BY MOUTH EVERY DAY AT BEDTIME AS NEEDED    Date of patient request: 11/13/2018 Last office visit: 09/01/2018 tele-med Date of last refill: 08/06/2018 Last refill amount: 20 tab with no refill Follow up time period per chart: n/a

## 2019-03-09 ENCOUNTER — Other Ambulatory Visit: Payer: Self-pay | Admitting: Emergency Medicine

## 2019-03-09 DIAGNOSIS — I10 Essential (primary) hypertension: Secondary | ICD-10-CM

## 2019-03-09 NOTE — Telephone Encounter (Signed)
Forwarding medication refill request to the clinical pool for review. 

## 2019-05-04 ENCOUNTER — Other Ambulatory Visit: Payer: Self-pay | Admitting: Emergency Medicine

## 2019-05-04 NOTE — Telephone Encounter (Signed)
Requested medication (s) are due for refill today: yes  Requested medication (s) are on the active medication list: yes  Last refill: 11/15/2018  Future visit scheduled: no  Notes to clinic:  Not delegated    Requested Prescriptions  Pending Prescriptions Disp Refills   ALPRAZolam (XANAX) 1 MG tablet [Pharmacy Med Name: ALPRAZOLAM 1 MG TABLET] 20 tablet 0    Sig: TAKE 1 TABLET BY MOUTH EVERY DAY AT BEDTIME AS NEEDED     Not Delegated - Psychiatry:  Anxiolytics/Hypnotics Failed - 05/04/2019  7:18 AM      Failed - This refill cannot be delegated      Failed - Urine Drug Screen completed in last 360 days.      Failed - Valid encounter within last 6 months    Recent Outpatient Visits          8 months ago Hypertension, unspecified type   Primary Care at [facility], [place], [place]   8 months ago Hypertension, unspecified type   [facility] at [facility], Maria Antonia, [place]   8 months ago Lower abdominal pain   Primary Care at [facility], Ines Bloomer, MD   9 months ago Flu-like symptoms   Primary Care at [facility], Ellie Lunch, [place]   10 months ago Yeast dermatitis   Primary Care at [facility], [place], [place]

## 2019-05-10 ENCOUNTER — Encounter: Payer: Self-pay | Admitting: *Deleted

## 2019-05-10 ENCOUNTER — Other Ambulatory Visit: Payer: Self-pay | Admitting: Urology

## 2019-05-10 DIAGNOSIS — C61 Malignant neoplasm of prostate: Secondary | ICD-10-CM

## 2019-05-31 ENCOUNTER — Ambulatory Visit (HOSPITAL_COMMUNITY)
Admission: RE | Admit: 2019-05-31 | Discharge: 2019-05-31 | Disposition: A | Discharge status: Home / Self Care | Payer: 59 | Source: Ambulatory Visit | Attending: Urology | Admitting: Urology

## 2019-05-31 ENCOUNTER — Other Ambulatory Visit: Payer: Self-pay

## 2019-05-31 DIAGNOSIS — C61 Malignant neoplasm of prostate: Secondary | ICD-10-CM | POA: Diagnosis not present

## 2019-05-31 MED ORDER — TECHNETIUM TC 99M MEDRONATE IV KIT
21.6000 | PACK | Freq: Once | INTRAVENOUS | Status: AC | PRN
  Administered 2019-05-31: 21.6 via INTRAVENOUS

## 2019-06-07 ENCOUNTER — Other Ambulatory Visit: Payer: Self-pay

## 2019-06-07 ENCOUNTER — Encounter: Payer: Self-pay | Admitting: Emergency Medicine

## 2019-06-07 ENCOUNTER — Ambulatory Visit (INDEPENDENT_AMBULATORY_CARE_PROVIDER_SITE_OTHER): Payer: 59 | Admitting: Emergency Medicine

## 2019-06-07 VITALS — BP 129/73 | HR 80 | Temp 98.3°F | Resp 16 | Ht 74.0 in | Wt 310.0 lb

## 2019-06-07 DIAGNOSIS — E785 Hyperlipidemia, unspecified: Secondary | ICD-10-CM

## 2019-06-07 DIAGNOSIS — I10 Essential (primary) hypertension: Secondary | ICD-10-CM | POA: Diagnosis not present

## 2019-06-07 DIAGNOSIS — R0981 Nasal congestion: Secondary | ICD-10-CM | POA: Diagnosis not present

## 2019-06-07 DIAGNOSIS — J0100 Acute maxillary sinusitis, unspecified: Secondary | ICD-10-CM

## 2019-06-07 DIAGNOSIS — Z23 Encounter for immunization: Secondary | ICD-10-CM

## 2019-06-07 MED ORDER — ALPRAZOLAM 1 MG PO TABS: ORAL_TABLET | ORAL | 0 refills | Status: DC

## 2019-06-07 MED ORDER — AMOXICILLIN-POT CLAVULANATE 875-125 MG PO TABS: 1.0000 | ORAL_TABLET | Freq: Two times a day (BID) | ORAL | 0 refills | Status: AC

## 2019-06-07 MED ORDER — TRIAMCINOLONE ACETONIDE 55 MCG/ACT NA AERO: 2.0000 | INHALATION_SPRAY | Freq: Every day | NASAL | 12 refills | Status: DC

## 2019-06-07 NOTE — Patient Instructions (Addendum)
If you have lab work done today you will be contacted with your lab results within the next 2 weeks.  If you have not heard from [place] then please contact us. The fastest way to get your results is to register for My Chart.   IF you received an x-ray today, you will receive an invoice from [facility]. Please contact [facility] at [phone] with questions or concerns regarding your invoice.   IF you received labwork today, you will receive an invoice from [facility]. Please contact [facility] at [phone] with questions or concerns regarding your invoice.   Our billing staff will not be able to assist you with questions regarding bills from these companies.  You will be contacted with the lab results as soon as they are available. The fastest way to get your results is to activate your My Chart account. Instructions are located on the last page of this paperwork. If you have not heard from [place] regarding the results in 2 weeks, please contact this office.     Sinusitis, Adult Sinusitis is soreness and swelling (inflammation) of your sinuses. Sinuses are hollow spaces in the bones around your face. They are located:  Around your eyes.  In the middle of your forehead.  Behind your nose.  In your cheekbones. Your sinuses and nasal passages are lined with a fluid called mucus. Mucus drains out of your sinuses. Swelling can trap mucus in your sinuses. This lets germs (bacteria, virus, or fungus) grow, which leads to infection. Most of the time, this condition is caused by a virus. What are the causes? This condition is caused by:  Allergies.  Asthma.  Germs.  Things that block your nose or sinuses.  Growths in the nose (nasal polyps).  Chemicals or irritants in the air.  Fungus (rare). What increases the risk? You are more likely to develop this condition if:  You have a weak body defense system (immune system).  You do a lot of swimming or  diving.  You use nasal sprays too much.  You smoke. What are the signs or symptoms? The main symptoms of this condition are pain and a feeling of pressure around the sinuses. Other symptoms include:  Stuffy nose (congestion).  Runny nose (drainage).  Swelling and warmth in the sinuses.  Headache.  Toothache.  A cough that may get worse at night.  Mucus that collects in the throat or the back of the nose (postnasal drip).  Being unable to smell and taste.  Being very tired (fatigue).  A fever.  Sore throat.  Bad breath. How is this diagnosed? This condition is diagnosed based on:  Your symptoms.  Your medical history.  A physical exam.  Tests to find out if your condition is short-term (acute) or long-term (chronic). Your doctor may: ? Check your nose for growths (polyps). ? Check your sinuses using a tool that has a light (endoscope). ? Check for allergies or germs. ? Do imaging tests, such as an MRI or CT scan. How is this treated? Treatment for this condition depends on the cause and whether it is short-term or long-term.  If caused by a virus, your symptoms should go away on their own within 10 days. You may be given medicines to relieve symptoms. They include: ? Medicines that shrink swollen tissue in the nose. ? Medicines that treat allergies (antihistamines). ? A spray that treats swelling of the nostrils. ? Rinses that help get rid of thick mucus in your  nose (nasal saline washes).  If caused by bacteria, your doctor may wait to see if you will get better without treatment. You may be given antibiotic medicine if you have: ? A very bad infection. ? A weak body defense system.  If caused by growths in the nose, you may need to have surgery. Follow these instructions at home: Medicines  Take, use, or apply over-the-counter and prescription medicines only as told by your doctor. These may include nasal sprays.  If you were prescribed an antibiotic  medicine, take it as told by your doctor. Do not stop taking the antibiotic even if you start to feel better. Hydrate and humidify   Drink enough water to keep your pee (urine) pale yellow.  Use a cool mist humidifier to keep the humidity level in your home above 50%.  Breathe in steam for 10-15 minutes, 3-4 times a day, or as told by your doctor. You can do this in the bathroom while a hot shower is running.  Try not to spend time in cool or dry air. Rest  Rest as much as you can.  Sleep with your head raised (elevated).  Make sure you get enough sleep each night. General instructions   Put a warm, moist washcloth on your face 3-4 times a day, or as often as told by your doctor. This will help with discomfort.  Wash your hands often with soap and water. If there is no soap and water, use hand sanitizer.  Do not smoke. Avoid being around people who are smoking (secondhand smoke).  Keep all follow-up visits as told by your doctor. This is important. Contact a doctor if:  You have a fever.  Your symptoms get worse.  Your symptoms do not get better within 10 days. Get help right away if:  You have a very bad headache.  You cannot stop throwing up (vomiting).  You have very bad pain or swelling around your face or eyes.  You have trouble seeing.  You feel confused.  Your neck is stiff.  You have trouble breathing. Summary  Sinusitis is swelling of your sinuses. Sinuses are hollow spaces in the bones around your face.  This condition is caused by tissues in your nose that become inflamed or swollen. This traps germs. These can lead to infection.  If you were prescribed an antibiotic medicine, take it as told by your doctor. Do not stop taking it even if you start to feel better.  Keep all follow-up visits as told by your doctor. This is important. This information is not intended to replace advice given to you by your health care provider. Make sure you discuss  any questions you have with your health care provider. Document Released: 11/13/2007 Document Revised: 10/27/2017 Document Reviewed: 10/27/2017 Elsevier Patient Education  2020 [employer].

## 2019-06-07 NOTE — Progress Notes (Signed)
GU Location of Tumor / Histology: prostatic adenocarcinoma  If Prostate Cancer, Gleason Score is (4 + 3) and PSA is (14.20). Prostate volume: 137 grams.  [name] [name] was diagnosed with prostate cancer in 02/2017 when his PSA was 6.88. Unfortunately, surveillance biopsy revealed upstaging.   Biopsies of prostate (if applicable) revealed:   Past/Anticipated interventions by urology, if any: Cystoscopy, prostate biopsy, surveillance prostate biopsies, CT abdomen pelvis (borderline enlarged lymph nodes that are stable from prior exam), bone scan (negative), referral to Dr. Tammi Klippel for consideration of radiotherapy  Past/Anticipated interventions by medical oncology, if any: no  Weight changes, if any: no  Bowel/Bladder complaints, if any: IPSS 3. SHIM 1. Denies dysuria, hematuria, urinary leakage or incontinence. Denies any bowel complaints.    Nausea/Vomiting, if any: no  Pain issues, if any:  no  SAFETY ISSUES:  Prior radiation? denies  Pacemaker/ICD? denies  Possible current pregnancy? no, male patient  Is the patient on methotrexate? denies  Current Complaints / other details:  68 year old. African [place] male. Married. Retired. Patient reports he did not have a relationship with his father but he understands he had prostate cancer. Patient reports his mother had a history of breast cancer.

## 2019-06-07 NOTE — Progress Notes (Signed)
[name] [name] 68 y.o.   Chief Complaint  Patient presents with  . Sinus Problem    per patient stopped up with bloody nose with burning  . Headache    per patient slightly    HISTORY OF PRESENT ILLNESS: This is a 68 y.o. male complaining of possible sinus infection for 1 week progressively getting worse.  Complaining of nasal congestion with bloody nose and nasal discharge along with sinus headaches worse in the morning and better during the day.  Denies any other significant symptoms.  HPI   Prior to Admission medications   Medication Sig Start Date End Date Taking? Authorizing Provider  ALPRAZolam Duanne Moron) 1 MG tablet TAKE 1 TABLET BY MOUTH EVERY DAY AT BEDTIME AS NEEDED 11/15/18  Yes Deegan Valentino, Ines Bloomer, MD  finasteride (PROPECIA) 1 MG tablet Take 1 mg by mouth daily.   Yes [provider]  lisinopril-hydrochlorothiazide (ZESTORETIC) 10-12.5 MG tablet TAKE 1 TABLET BY MOUTH EVERY DAY 03/10/19  Yes Deaundre Allston, Ines Bloomer, MD  methocarbamol (ROBAXIN) 500 MG tablet Take 1 tablet (500 mg total) 2 (two) times daily by mouth. 04/25/17  Yes Joy, Shawn C, PA-C  rosuvastatin (CRESTOR) 10 MG tablet Take 1 tablet (10 mg total) by mouth daily. 09/01/18  Yes Horald Pollen, MD    Allergies  Allergen Reactions  . Aspirin     REACTION: upsets stomach  . Statins     REACTION: UPSETS STOMACH  . Gadolinium Derivatives Nausea And Vomiting    Pt was given 20 ml multihance and became nauseated and vomited several times.     Patient Active Problem List   Diagnosis Date Noted  . Elevated PSA 08/29/2017  . Prostate enlargement 08/29/2017  . Gout 10/25/2014  . Hypertension   . BPH (benign prostatic hypertrophy)   . Kidney stones   . Prostate nodule     Past Medical History:  Diagnosis Date  . Allergy   . Anxiety   . Arthritis   . BPH (benign prostatic hypertrophy)   . Colon polyp    diverticular bleed with transfusion  . Hyperlipidemia   . Hypertension   . Kidney  stones    15-20  . Obesity, unspecified   . Prostate nodule [id number]    Past Surgical History:  Procedure Laterality Date  . ANKLE FUSION    . HERNIA REPAIR    . JOINT REPLACEMENT    . TRANSURETHRAL RESECTION OF PROSTATE      Social History   Socioeconomic History  . Marital status: Married    Spouse name: Not on file  . Number of children: 2  . Years of education: Not on file  . Highest education level: Not on file  Occupational History  . Not on file  Tobacco Use  . Smoking status: Never Smoker  . Smokeless tobacco: Never Used  Substance and Sexual Activity  . Alcohol use: No  . Drug use: No  . Sexual activity: Yes  Other Topics Concern  . Not on file  Social History Narrative  . Not on file   Social Determinants of Health   Financial Resource Strain:   . Difficulty of Paying Living Expenses: Not on file  Food Insecurity:   . Worried About Charity fundraiser in the Last Year: Not on file  . Ran Out of Food in the Last Year: Not on file  Transportation Needs:   . Lack of Transportation (Medical): Not on file  . Lack of Transportation (Non-Medical): Not on file  Physical Activity:   . Days of Exercise per Week: Not on file  . Minutes of Exercise per Session: Not on file  Stress:   . Feeling of Stress : Not on file  Social Connections:   . Frequency of Communication with Friends and Family: Not on file  . Frequency of Social Gatherings with Friends and Family: Not on file  . Attends Religious Services: Not on file  . Active Member of Clubs or Organizations: Not on file  . Attends Archivist Meetings: Not on file  . Marital Status: Not on file  Intimate Partner Violence:   . Fear of Current or Ex-Partner: Not on file  . Emotionally Abused: Not on file  . Physically Abused: Not on file  . Sexually Abused: Not on file    Family History  Problem Relation Age of Onset  . Diabetes Mother   . Prostate cancer Other   . Diabetes Other       Review of Systems  Constitutional: Negative.  Negative for chills and fever.  HENT: Positive for congestion and sinus pain. Negative for ear pain and sore throat.   Respiratory: Negative.  Negative for cough and shortness of breath.   Cardiovascular: Negative.  Negative for chest pain and palpitations.  Gastrointestinal: Negative.  Negative for abdominal pain, diarrhea, nausea and vomiting.  Genitourinary:       Recently diagnosed with prostate cancer.  To schedule radiation therapy.  Skin: Negative.  Negative for rash.  Neurological: Positive for headaches. Negative for dizziness.  All other systems reviewed and are negative.    Vitals:   06/07/19 1134  BP: 129/73  Pulse: 80  Resp: 16  Temp: 98.3 F (36.8 C)  SpO2: 97%     Physical Exam Vitals reviewed.  Constitutional:      Appearance: Normal appearance. He is well-developed.  HENT:     Head: Normocephalic.     Nose: Congestion present.     Right Turbinates: Swollen.     Left Turbinates: Swollen.     Right Sinus: Maxillary sinus tenderness present.     Left Sinus: Maxillary sinus tenderness present.     Mouth/Throat:     Mouth: Mucous membranes are moist.     Pharynx: Oropharynx is clear.  Eyes:     Extraocular Movements: Extraocular movements intact.     Conjunctiva/sclera: Conjunctivae normal.     Pupils: Pupils are equal, round, and reactive to light.  Cardiovascular:     Rate and Rhythm: Regular rhythm.     Pulses: Normal pulses.     Heart sounds: Normal heart sounds.  Pulmonary:     Effort: Pulmonary effort is normal.     Breath sounds: Normal breath sounds.  Musculoskeletal:        General: Normal range of motion.     Cervical back: Normal range of motion and neck supple.  Skin:    General: Skin is warm and dry.     Capillary Refill: Capillary refill takes less than 2 seconds.  Neurological:     General: No focal deficit present.     Mental Status: He is alert and oriented to person, place, and  time.  Psychiatric:        Mood and Affect: Mood normal.        Behavior: Behavior normal.    A total of 25 minutes was spent in the room with the patient, greater than 50% of which was in counseling/coordination of care regarding sinusitis and treatment,  medications, hypertension precautions with oral decongestants, prognosis, and need for follow-up if no better in 1 to 2 weeks.   ASSESSMENT & PLAN:  Arlene was seen today for sinus problem and headache.  Diagnoses and all orders for this visit:  Acute non-recurrent maxillary sinusitis -     amoxicillin-clavulanate (AUGMENTIN) 875-125 MG tablet; Take 1 tablet by mouth 2 (two) times daily for 7 days.  Sinus congestion -     triamcinolone (NASACORT) 55 MCG/ACT AERO nasal inhaler; Place 2 sprays into the nose daily.  Essential hypertension  Need for prophylactic vaccination against Streptococcus pneumoniae (pneumococcus) -     Pneumococcal (PPSV23) vaccine  Other orders -     ALPRAZolam (XANAX) 1 MG tablet; TAKE HALF TABLET BY MOUTH EVERY DAY AT BEDTIME AS NEEDED    Patient Instructions       If you have lab work done today you will be contacted with your lab results within the next 2 weeks.  If you have not heard from [place] then please contact us. The fastest way to get your results is to register for My Chart.   IF you received an x-ray today, you will receive an invoice from [facility]. Please contact [facility] at [phone] with questions or concerns regarding your invoice.   IF you received labwork today, you will receive an invoice from [facility]. Please contact [facility] at [phone] with questions or concerns regarding your invoice.   Our billing staff will not be able to assist you with questions regarding bills from these companies.  You will be contacted with the lab results as soon as they are available. The fastest way to get your results is to activate your My Chart account. Instructions  are located on the last page of this paperwork. If you have not heard from [place] regarding the results in 2 weeks, please contact this office.     Sinusitis, Adult Sinusitis is soreness and swelling (inflammation) of your sinuses. Sinuses are hollow spaces in the bones around your face. They are located:  Around your eyes.  In the middle of your forehead.  Behind your nose.  In your cheekbones. Your sinuses and nasal passages are lined with a fluid called mucus. Mucus drains out of your sinuses. Swelling can trap mucus in your sinuses. This lets germs (bacteria, virus, or fungus) grow, which leads to infection. Most of the time, this condition is caused by a virus. What are the causes? This condition is caused by:  Allergies.  Asthma.  Germs.  Things that block your nose or sinuses.  Growths in the nose (nasal polyps).  Chemicals or irritants in the air.  Fungus (rare). What increases the risk? You are more likely to develop this condition if:  You have a weak body defense system (immune system).  You do a lot of swimming or diving.  You use nasal sprays too much.  You smoke. What are the signs or symptoms? The main symptoms of this condition are pain and a feeling of pressure around the sinuses. Other symptoms include:  Stuffy nose (congestion).  Runny nose (drainage).  Swelling and warmth in the sinuses.  Headache.  Toothache.  A cough that may get worse at night.  Mucus that collects in the throat or the back of the nose (postnasal drip).  Being unable to smell and taste.  Being very tired (fatigue).  A fever.  Sore throat.  Bad breath. How is this diagnosed? This condition is diagnosed based on:  Your  symptoms.  Your medical history.  A physical exam.  Tests to find out if your condition is short-term (acute) or long-term (chronic). Your doctor may: ? Check your nose for growths (polyps). ? Check your sinuses using a tool that has a light  (endoscope). ? Check for allergies or germs. ? Do imaging tests, such as an MRI or CT scan. How is this treated? Treatment for this condition depends on the cause and whether it is short-term or long-term.  If caused by a virus, your symptoms should go away on their own within 10 days. You may be given medicines to relieve symptoms. They include: ? Medicines that shrink swollen tissue in the nose. ? Medicines that treat allergies (antihistamines). ? A spray that treats swelling of the nostrils. ? Rinses that help get rid of thick mucus in your nose (nasal saline washes).  If caused by bacteria, your doctor may wait to see if you will get better without treatment. You may be given antibiotic medicine if you have: ? A very bad infection. ? A weak body defense system.  If caused by growths in the nose, you may need to have surgery. Follow these instructions at home: Medicines  Take, use, or apply over-the-counter and prescription medicines only as told by your doctor. These may include nasal sprays.  If you were prescribed an antibiotic medicine, take it as told by your doctor. Do not stop taking the antibiotic even if you start to feel better. Hydrate and humidify   Drink enough water to keep your pee (urine) pale yellow.  Use a cool mist humidifier to keep the humidity level in your home above 50%.  Breathe in steam for 10-15 minutes, 3-4 times a day, or as told by your doctor. You can do this in the bathroom while a hot shower is running.  Try not to spend time in cool or dry air. Rest  Rest as much as you can.  Sleep with your head raised (elevated).  Make sure you get enough sleep each night. General instructions   Put a warm, moist washcloth on your face 3-4 times a day, or as often as told by your doctor. This will help with discomfort.  Wash your hands often with soap and water. If there is no soap and water, use hand sanitizer.  Do not smoke. Avoid being around  people who are smoking (secondhand smoke).  Keep all follow-up visits as told by your doctor. This is important. Contact a doctor if:  You have a fever.  Your symptoms get worse.  Your symptoms do not get better within 10 days. Get help right away if:  You have a very bad headache.  You cannot stop throwing up (vomiting).  You have very bad pain or swelling around your face or eyes.  You have trouble seeing.  You feel confused.  Your neck is stiff.  You have trouble breathing. Summary  Sinusitis is swelling of your sinuses. Sinuses are hollow spaces in the bones around your face.  This condition is caused by tissues in your nose that become inflamed or swollen. This traps germs. These can lead to infection.  If you were prescribed an antibiotic medicine, take it as told by your doctor. Do not stop taking it even if you start to feel better.  Keep all follow-up visits as told by your doctor. This is important. This information is not intended to replace advice given to you by your health care provider. Make sure you  discuss any questions you have with your health care provider. Document Released: 11/13/2007 Document Revised: 10/27/2017 Document Reviewed: 10/27/2017 Elsevier Patient Education  2020 [employer]     [name], MD [facility]

## 2019-06-08 ENCOUNTER — Ambulatory Visit
Admission: RE | Admit: 2019-06-08 | Discharge: 2019-06-08 | Disposition: A | Discharge status: Home / Self Care | Payer: 59 | Source: Ambulatory Visit | Attending: Radiation Oncology | Admitting: Radiation Oncology

## 2019-06-08 ENCOUNTER — Encounter: Payer: Self-pay | Admitting: Radiation Oncology

## 2019-06-08 ENCOUNTER — Other Ambulatory Visit: Payer: Self-pay

## 2019-06-08 ENCOUNTER — Telehealth: Payer: Self-pay

## 2019-06-08 VITALS — Ht 74.0 in | Wt 310.0 lb

## 2019-06-08 DIAGNOSIS — C61 Malignant neoplasm of prostate: Secondary | ICD-10-CM

## 2019-06-08 NOTE — Progress Notes (Signed)
See progress note under physician encounter. 

## 2019-06-08 NOTE — Addendum Note (Signed)
Addended by: Amalia Hailey on: 06/08/2019 05:55 PM   Modules accepted: Orders

## 2019-06-08 NOTE — Progress Notes (Signed)
Radiation Oncology         [phone] ________________________________  Initial outpatient Consultation - Conducted via Telephone due to current COVID-19 concerns for limiting patient exposure  Name: [name] [name] MRN: [id number]  Date: 06/08/2019  DOB: 1950/11/22  [id number], Ines Bloomer, MD  Lucas Mallow, MD   REFERRING PHYSICIAN: Lucas Mallow, MD  DIAGNOSIS: 68 y.o. gentleman with Stage T1c adenocarcinoma of the prostate with Gleason score of 4+3, and PSA of 14.2.    ICD-10-CM   1. Malignant neoplasm of prostate ([place])  C61     HISTORY OF PRESENT ILLNESS: [name] [name] is a 68 y.o. male with a diagnosis of prostate cancer. He has a history of an BPH with BOO and elevated PSA since at least 2003. He was initially referred to Dr. Gaynelle Arabian in 06/2001 for a PSA of 4.29, as well as prostatitis. He subsequently underwent prostate biopsy on 06/26/2001, which was benign aside from a microscopic focus of atypical glands. Later that year, on 01/28/2002, he underwent surgical excision of bladder stones. Pathology was again benign. On 08/08/2003, he underwent TURP, with pathology showing stromal and glandular hyperplasia with focal basal cell hyperplasia and chronic prostatitis, negative for prostatic carcinoma.  He was referred back to Dr. Gaynelle Arabian in 03/2009 after his PCP, Dr. Elder Cyphers, noted a prostate nodule on physical exam. PSA at that time was 5.65. He proceeded to repeat biopsy on 05/19/2009, which was again benign with one area of HGPIN in the left lateral apex. He was treated for prostatitis, and his PSA decreaseded to 3.06 in 11/2009 and to 2.29 in 05/2010. Dr. Gaynelle Arabian cared for the patient until 2018 when he retired. The patient's last PSA with Dr. Gaynelle Arabian was 6.88 in 04/2016.  He was referred back to [facility] when his PSA had further elevated to 11 in 01/2017. He was seen by Dr. Gloriann Loan on 01/30/2017 and proceeded to repeat prostate biopsy on 02/27/2017. Prostate  volume at that time was 105 grams. Pathology from this procedure confirmed prostatic adenocarcinoma with a Gleason score of 3+3 in 5-10% two cores in the left base lateral and left mid, as well as HGPIN in right mid lateral.  After extensive discussion regarding treatment options, he wisely elected to pursue active surveillance at that time.   In 08/2017, his PSA remained elevated at 11.7, and he had an episode of gross hematuria. He underwent cystoscopy, which yielded negative pathology, and CT scan, which was also negative. His PSA rose again to 13.6 in 09/2017. He underwent surveillance prostate MRI, which showed a 110 g prostate with no evidence of high-grade lesions and therefore elected to continue under active surveillance.   His PSA had decreased to 8.92 in 12/2017 but rose again to 11.3 by 06/2018. He had a recurrence of painless gross hematuria in early 2020 and underwent CT A/P on 07/13/2018 which showed no radiographic evidence of urinary tract neoplasm, urolithiasis, or hydronephrosis.  There was stable, mildly enlarged prostate and probable chronic bladder outlet obstruction with stable, borderline enlarged bilateral external iliac lymph nodes. He underwent repeat cystoscopy on 08/13/2018, which showed an obstructing prostate with an enlarged intravesical median lobe and severe hyperplasia but no concerns for bladder carcinoma.  His PSA continued to rise, up to 14.2 in 02/2019. Dr. Gloriann Loan performed repeat prostate biopsy on 04/29/2019, which unfortunately revealed upstaging of his disease. The prostate volume was 137 g. Pathology revealed prostatic adenocarcinoma in 5 out of 12 cores. The maximum Gleason score was 4+3, and  this was seen in right apex. Additionally, Gleason 3+4 was seen in right apex lateral, left apex, and left apex lateral, and Gleason 3+3 in left mid lateral (small focus).  For disease staging, he underwent CT A/P on 05/31/2019, which showed prostatomegaly (120 cc) with stable  prominence of the right seminal vesicle, and stable borderline bilateral external iliac adenopathy. That same day, he also underwent bone scan, which showed no evidence of bony metastatic disease.  The patient reviewed the biopsy results with his urologist and he has kindly been referred today for discussion of potential radiation treatment options.   PREVIOUS RADIATION THERAPY: No  PAST MEDICAL HISTORY:  Past Medical History:  Diagnosis Date  . Allergy   . Anxiety   . Arthritis   . BPH (benign prostatic hypertrophy)   . Colon polyp    diverticular bleed with transfusion  . Hyperlipidemia   . Hypertension   . Kidney stones    15-20  . Obesity, unspecified   . Prostate cancer ([place])   . Prostate nodule [id number]      PAST SURGICAL HISTORY: Past Surgical History:  Procedure Laterality Date  . ANKLE FUSION    . HERNIA REPAIR    . JOINT REPLACEMENT    . TRANSURETHRAL RESECTION OF PROSTATE      FAMILY HISTORY:  Family History  Problem Relation Age of Onset  . Diabetes Mother   . Breast cancer Mother   . Prostate cancer Father        patient did not have a relationship with his father but understands he had prostate ca  . Colon cancer Neg Hx   . Pancreatic cancer Neg Hx     SOCIAL HISTORY:  Social History   Socioeconomic History  . Marital status: Married    Spouse name: Not on file  . Number of children: 2  . Years of education: Not on file  . Highest education level: Not on file  Occupational History  . Not on file  Tobacco Use  . Smoking status: Never Smoker  . Smokeless tobacco: Never Used  Substance and Sexual Activity  . Alcohol use: No  . Drug use: No  . Sexual activity: Yes  Other Topics Concern  . Not on file  Social History Narrative  . Not on file   Social Determinants of Health   Financial Resource Strain:   . Difficulty of Paying Living Expenses: Not on file  Food Insecurity:   . Worried About Charity fundraiser in the Last Year: Not on  file  . Ran Out of Food in the Last Year: Not on file  Transportation Needs:   . Lack of Transportation (Medical): Not on file  . Lack of Transportation (Non-Medical): Not on file  Physical Activity:   . Days of Exercise per Week: Not on file  . Minutes of Exercise per Session: Not on file  Stress:   . Feeling of Stress : Not on file  Social Connections:   . Frequency of Communication with Friends and Family: Not on file  . Frequency of Social Gatherings with Friends and Family: Not on file  . Attends Religious Services: Not on file  . Active Member of Clubs or Organizations: Not on file  . Attends Archivist Meetings: Not on file  . Marital Status: Not on file  Intimate Partner Violence:   . Fear of Current or Ex-Partner: Not on file  . Emotionally Abused: Not on file  . Physically Abused: Not  on file  . Sexually Abused: Not on file    ALLERGIES: Aspirin, Statins, and Gadolinium derivatives  MEDICATIONS:  Current Outpatient Medications  Medication Sig Dispense Refill  . ALPRAZolam (XANAX) 1 MG tablet TAKE HALF TABLET BY MOUTH EVERY DAY AT BEDTIME AS NEEDED 20 tablet 0  . amoxicillin-clavulanate (AUGMENTIN) 875-125 MG tablet Take 1 tablet by mouth 2 (two) times daily for 7 days. 14 tablet 0  . finasteride (PROPECIA) 1 MG tablet Take 1 mg by mouth daily.    [name] Kitchen lisinopril-hydrochlorothiazide (ZESTORETIC) 10-12.5 MG tablet TAKE 1 TABLET BY MOUTH EVERY DAY 30 tablet 2  . methocarbamol (ROBAXIN) 500 MG tablet Take 1 tablet (500 mg total) 2 (two) times daily by mouth. 20 tablet 0  . rosuvastatin (CRESTOR) 10 MG tablet Take 1 tablet (10 mg total) by mouth daily. 90 tablet 3  . triamcinolone (NASACORT) 55 MCG/ACT AERO nasal inhaler Place 2 sprays into the nose daily. 1 Inhaler 12   No current facility-administered medications for this encounter.    REVIEW OF SYSTEMS:  On review of systems, the patient reports that he is doing well overall. He denies any chest pain, shortness  of breath, cough, fevers, chills, night sweats, unintended weight changes. He denies any bowel disturbances, and denies abdominal pain, nausea or vomiting. He denies any new musculoskeletal or joint aches or pains. His IPSS was 3, indicating mild urinary symptoms. He reports he has been on finasteride for about 6 months. His SHIM was 1, indicating he has severe erectile dysfunction. He has tried Cialis and Viagra in the past with no success. A complete review of systems is obtained and is otherwise negative.    PHYSICAL EXAM:  Wt Readings from Last 3 Encounters:  06/08/19 (!) 310 lb (140.6 kg)  06/07/19 (!) 310 lb (140.6 kg)  08/10/18 (!) 319 lb 3.2 oz (144.8 kg)   Temp Readings from Last 3 Encounters:  06/07/19 98.3 F (36.8 C) (Oral)  08/10/18 98.7 F (37.1 C) (Oral)  07/30/18 98.4 F (36.9 C) (Oral)   BP Readings from Last 3 Encounters:  06/07/19 129/73  08/10/18 (!) 152/78  07/30/18 130/70   Pulse Readings from Last 3 Encounters:  06/07/19 80  08/10/18 83  07/30/18 92   Pain Assessment Pain Score: 0-No pain/10  Physical exam not performed in light of telephone consult visit format.    KPS = 90  100 - Normal; no complaints; no evidence of disease. 90   - Able to carry on normal activity; minor signs or symptoms of disease. 80   - Normal activity with effort; some signs or symptoms of disease. 48   - Cares for self; unable to carry on normal activity or to do active work. 60   - Requires occasional assistance, but is able to care for most of his personal needs. 50   - Requires considerable assistance and frequent medical care. 66   - Disabled; requires special care and assistance. 70   - Severely disabled; hospital admission is indicated although death not imminent. 70   - Very sick; hospital admission necessary; active supportive treatment necessary. 10   - Moribund; fatal processes progressing rapidly. 0     - Dead  Karnofsky DA, Abelmann WH, Craver LS and Burchenal  [facility] [phone]) The use of the nitrogen mustards in the palliative treatment of carcinoma: with particular reference to bronchogenic carcinoma Cancer 1 634-56  LABORATORY DATA:  Lab Results  Component Value Date   WBC 5.3 08/31/2018   HGB  12.5 (L) 08/31/2018   HCT 38.4 08/31/2018   MCV 81 08/31/2018   PLT 204 08/31/2018   Lab Results  Component Value Date   NA 141 08/10/2018   K 3.6 08/10/2018   CL 104 08/10/2018   CO2 22 08/10/2018   Lab Results  Component Value Date   ALT 21 08/10/2018   AST 19 08/10/2018   ALKPHOS 62 08/10/2018   BILITOT <0.2 08/10/2018     RADIOGRAPHY: NM Bone Scan Whole Body  Result Date: 05/31/2019 CLINICAL DATA:  Prostate cancer. EXAM: NUCLEAR MEDICINE WHOLE BODY BONE SCAN TECHNIQUE: Whole body anterior and posterior images were obtained approximately 3 hours after intravenous injection of radiopharmaceutical. RADIOPHARMACEUTICALS:  21.6 mCi Technetium-66m MDP IV COMPARISON:  Abdomen and pelvis CT obtained today. FINDINGS: Increased tracer uptake in both shoulders, both sternoclavicular joints, both wrists, both hands at the MCP joints, both knees, both ankles and multiple MTP joints bilaterally. Small amount of increased tracer uptake at the right 8th costovertebral joint. Normal tracer distribution throughout the remainder of the bony skeleton. Normal renal and bladder activity. IMPRESSION: 1. No evidence of bony metastatic disease. 2. Multi joint degenerative changes. Electronically Signed   By: Claudie Revering M.D.   On: 05/31/2019 20:29      IMPRESSION/PLAN: This visit was conducted via Telephone to spare the patient unnecessary potential exposure in the healthcare setting during the current COVID-19 pandemic. 1. 68 y.o. gentleman with Stage T1c adenocarcinoma of the prostate with Gleason Score of 4+3, and PSA of 14.2. We discussed the patient's workup and outlined the nature of prostate cancer in this setting. The patient's T stage, Gleason's score, and PSA put  him into the unfavorable intermediate risk group. Accordingly, he is eligible for a variety of potential treatment options including brachytherapy or 5.5 weeks of external radiation. We discussed the available radiation techniques, and focused on the details and logistics of delivery. The patient is not an ideal candidate for brachytherapy with a prostate volume of 137 g. We therefore focused our discussion on external beam radiotherapy, discussing and outlining the risks, benefits, short and long-term effects associated with prostate IMRT and compared and contrasted these with prostatectomy. We discussed the role of SpaceOAR in reducing the rectal toxicity associated with radiotherapy. We also detailed the role of ADT in the treatment of unfavorable intermediate risk prostate cancer and outlined the associated side effects that could be expected with this therapy.  We also explained the rationale behind the intentional delay of starting radiotherapy for approximately 2 months after the start of ADT to allow for the radiosensitizing effects of this treatment.  He was encouraged to ask questions that were answered to his stated satisfaction.  At the end of the conversation, the patient is interested in moving forward with 5.5 weeks of external beam therapy in combination with ST-ADT. He has not received his first Lupron injection. We will share our discussion with Dr. Gloriann Loan and move forward with coordinating a follow up appointment for start of ADT, first available.  We will also coordinate for fiducial markers and SpaceOAR placement to reduce rectal toxicity from radiotherapy, in late Feb. 2021, prior to CT simulation in anticipation of beginning his daily radiation treatments in early March 2021.  He has a scheduled follow-up appointment with Dr. Gloriann Loan on 07/01/2019 but wishes to proceed with ADT prior to that visit if at all possible.  We will share our discussion with Dr. Gloriann Loan and move forward with treatment  planning accordingly.  Given current  concerns for patient exposure during the COVID-19 pandemic, this encounter was conducted via telephone. The patient was notified in advance and was offered a MyChart meeting to allow for face to face communication but unfortunately reported that he did not have the appropriate resources/technology to support such a visit and instead preferred to proceed with telephone consult. The patient has given verbal consent for this type of encounter. The time spent during this encounter was 60 minutes. The attendants for this meeting include Tyler Pita MD, Ashlyn Bruning PA-C, [place], and patient, Child Pettis. During the encounter, Tyler Pita MD, Ashlyn Bruning PA-C, and scribe, Wilburn Mylar were located at [place].  Patient, Aquarius Geissler was located at home.    Nicholos Johns, PA-C    Tyler Pita, MD  [facility] Direct Dial: [phone]  Fax: [phone] [email or web address]  Skype  [email or web address]   This document serves as a record of services personally performed by Tyler Pita, MD and Freeman Caldron, PA-C. It was created on their behalf by Wilburn Mylar, a trained medical scribe. The creation of this record is based on the scribe's personal observations and the provider's statements to them. This document has been checked and approved by the attending provider.

## 2019-06-09 ENCOUNTER — Telehealth: Payer: Self-pay | Admitting: *Deleted

## 2019-06-09 NOTE — Telephone Encounter (Signed)
Left message in voice mail to call back concerning medication that was discuss yesterday, you stated you had 7 more pills before running out.

## 2019-06-15 ENCOUNTER — Telehealth: Payer: Self-pay | Admitting: Medical Oncology

## 2019-06-15 NOTE — Telephone Encounter (Signed)
Left message to introduce myself as the prostate nurse navigator and discuss my role. I asked for a return call to follow post consults with Dr. Tammi Klippel 12/29.

## 2019-06-16 NOTE — Telephone Encounter (Signed)
Patient was called on 06/09/2019 about the Crestor medication, Lisino/hctz was sent to the home delivery pharmacy [facility]. Patient requested Crestor be sent to local pharmacy because he only had 7 tablets left. Today I left a message in mobile voice mail for him to call back.

## 2019-06-25 ENCOUNTER — Telehealth: Payer: Self-pay | Admitting: *Deleted

## 2019-06-25 NOTE — Telephone Encounter (Signed)
Called patient to remind of ADT appt. For 07-01-19 - arrival time- 9:30 am @ Dr. Purvis [facility] Office, spoke with patient and he is aware of this appt.

## 2019-06-29 ENCOUNTER — Other Ambulatory Visit: Payer: Self-pay | Admitting: Urology

## 2019-06-29 DIAGNOSIS — C61 Malignant neoplasm of prostate: Secondary | ICD-10-CM

## 2019-07-12 ENCOUNTER — Encounter: Payer: Self-pay | Admitting: Emergency Medicine

## 2019-07-12 ENCOUNTER — Other Ambulatory Visit: Payer: Self-pay

## 2019-07-12 ENCOUNTER — Ambulatory Visit: Payer: Medicare PPO | Admitting: Emergency Medicine

## 2019-07-12 VITALS — BP 131/76 | HR 80 | Temp 97.9°F | Resp 16 | Ht 74.0 in | Wt 309.0 lb

## 2019-07-12 DIAGNOSIS — C61 Malignant neoplasm of prostate: Secondary | ICD-10-CM | POA: Diagnosis not present

## 2019-07-12 DIAGNOSIS — I10 Essential (primary) hypertension: Secondary | ICD-10-CM | POA: Diagnosis not present

## 2019-07-12 DIAGNOSIS — E785 Hyperlipidemia, unspecified: Secondary | ICD-10-CM | POA: Diagnosis not present

## 2019-07-12 DIAGNOSIS — S39012A Strain of muscle, fascia and tendon of lower back, initial encounter: Secondary | ICD-10-CM

## 2019-07-12 DIAGNOSIS — R7303 Prediabetes: Secondary | ICD-10-CM | POA: Diagnosis not present

## 2019-07-12 NOTE — Progress Notes (Signed)
[name] [name] 69 y.o.   Chief Complaint  Patient presents with  . Hypertension    follow up  . Hip Pain    RIGHT x 4 days     HISTORY OF PRESENT ILLNESS: This is a 69 y.o. male with chronic medical problems here for follow-up: 1.  Hypertension: On Zestoretic 10-12.5 mg daily. BP Readings from Last 3 Encounters:  07/12/19 131/76  06/07/19 129/73  08/10/18 (!) 152/78    2.  Dyslipidemia: On Crestor 10 mg daily. Lab Results  Component Value Date   CHOL 196 08/31/2018   HDL 32 (L) 08/31/2018   LDLCALC 113 (H) 08/31/2018   TRIG 256 (H) 08/31/2018   CHOLHDL 6.1 (H) 08/31/2018    3.  Prediabetes: No medications Lab Results  Component Value Date   HGBA1C 6.3 (H) 08/31/2018    4.  Malignant neoplasm of prostate.  Sees urologist regularly.  No metastatic disease. 5.  Right sided lumbar pain for the past 4 days.  Responsive to ibuprofen.  No associated symptoms.  Sharp localized pain worse with movement and better with rest.  Denies any direct injuries.  HPI   Prior to Admission medications   Medication Sig Start Date End Date Taking? Authorizing Provider  ALPRAZolam Duanne Moron) 1 MG tablet TAKE HALF TABLET BY MOUTH EVERY DAY AT BEDTIME AS NEEDED 06/07/19  Yes Haruki Arnold, Ines Bloomer, MD  finasteride (PROPECIA) 1 MG tablet Take 1 mg by mouth daily.   Yes [provider]  lisinopril-hydrochlorothiazide (ZESTORETIC) 10-12.5 MG tablet TAKE 1 TABLET BY MOUTH EVERY DAY 03/10/19  Yes Murl Zogg, Ines Bloomer, MD  rosuvastatin (CRESTOR) 10 MG tablet Take 1 tablet (10 mg total) by mouth daily. 09/01/18  Yes Karmon Andis, Ines Bloomer, MD  triamcinolone (NASACORT) 55 MCG/ACT AERO nasal inhaler Place 2 sprays into the nose daily. 06/07/19  Yes Quince Santana, Ines Bloomer, MD  methocarbamol (ROBAXIN) 500 MG tablet Take 1 tablet (500 mg total) 2 (two) times daily by mouth. Patient not taking: Reported on 07/12/2019 04/25/17   Lorayne Bender, PA-C    Allergies  Allergen Reactions  . Aspirin    REACTION: upsets stomach  . Statins     REACTION: UPSETS STOMACH  . Gadolinium Derivatives Nausea And Vomiting    Pt was given 20 ml multihance and became nauseated and vomited several times.     Patient Active Problem List   Diagnosis Date Noted  . Malignant neoplasm of prostate ([facility]) 06/08/2019  . Elevated PSA 08/29/2017  . Prostate enlargement 08/29/2017  . Gout 10/25/2014  . Hypertension   . BPH (benign prostatic hypertrophy)   . Kidney stones   . Prostate nodule     Past Medical History:  Diagnosis Date  . Allergy   . Anxiety   . Arthritis   . BPH (benign prostatic hypertrophy)   . Colon polyp    diverticular bleed with transfusion  . Diverticulosis   . Hyperlipidemia   . Hypertension   . Kidney stones    15-20  . Obesity, unspecified   . Prostate cancer ([facility])   . Prostate nodule [id number]    Past Surgical History:  Procedure Laterality Date  . ANKLE FUSION    . HERNIA REPAIR    . JOINT REPLACEMENT    . TRANSURETHRAL RESECTION OF PROSTATE      Social History   Socioeconomic History  . Marital status: Married    Spouse name: Not on file  . Number of children: 2  . Years of education: Not  on file  . Highest education level: Not on file  Occupational History  . Not on file  Tobacco Use  . Smoking status: Never Smoker  . Smokeless tobacco: Never Used  Substance and Sexual Activity  . Alcohol use: No  . Drug use: No  . Sexual activity: Yes  Other Topics Concern  . Not on file  Social History Narrative  . Not on file   Social Determinants of Health   Financial Resource Strain:   . Difficulty of Paying Living Expenses: Not on file  Food Insecurity:   . Worried About Charity fundraiser in the Last Year: Not on file  . Ran Out of Food in the Last Year: Not on file  Transportation Needs:   . Lack of Transportation (Medical): Not on file  . Lack of Transportation (Non-Medical): Not on file  Physical Activity:   . Days of Exercise per Week: Not  on file  . Minutes of Exercise per Session: Not on file  Stress:   . Feeling of Stress : Not on file  Social Connections:   . Frequency of Communication with Friends and Family: Not on file  . Frequency of Social Gatherings with Friends and Family: Not on file  . Attends Religious Services: Not on file  . Active Member of Clubs or Organizations: Not on file  . Attends Archivist Meetings: Not on file  . Marital Status: Not on file  Intimate Partner Violence:   . Fear of Current or Ex-Partner: Not on file  . Emotionally Abused: Not on file  . Physically Abused: Not on file  . Sexually Abused: Not on file    Family History  Problem Relation Age of Onset  . Diabetes Mother   . Breast cancer Mother   . Prostate cancer Father        patient did not have a relationship with his father but understands he had prostate ca  . Colon cancer Neg Hx   . Pancreatic cancer Neg Hx      Review of Systems  Constitutional: Negative.  Negative for chills and fever.  HENT: Negative.  Negative for congestion and sore throat.   Respiratory: Negative.  Negative for cough and shortness of breath.   Cardiovascular: Negative.  Negative for chest pain and palpitations.  Gastrointestinal: Negative for abdominal pain, blood in stool, diarrhea, melena, nausea and vomiting.  Genitourinary: Negative.  Negative for dysuria and hematuria.       Denies urinary symptoms  Musculoskeletal: Positive for back pain (Recent).  Skin: Negative.  Negative for rash.  Neurological: Negative.  Negative for dizziness, sensory change, speech change, focal weakness and headaches.  Endo/Heme/Allergies: Negative.   All other systems reviewed and are negative.  Today's Vitals   07/12/19 1011  BP: 131/76  Pulse: 80  Resp: 16  Temp: 97.9 F (36.6 C)  TempSrc: Temporal  SpO2: 95%  Weight: (!) 309 lb (140.2 kg)  Height: 6\' 2"  (1.88 m)   Body mass index is 39.67 kg/m.   Physical Exam Vitals reviewed.    Constitutional:      Appearance: Normal appearance.  HENT:     Head: Normocephalic.  Eyes:     Extraocular Movements: Extraocular movements intact.     Pupils: Pupils are equal, round, and reactive to light.  Cardiovascular:     Rate and Rhythm: Normal rate and regular rhythm.     Pulses: Normal pulses.     Heart sounds: Normal heart sounds.  Pulmonary:     Effort: Pulmonary effort is normal.     Breath sounds: Normal breath sounds.  Abdominal:     General: There is no distension.     Palpations: Abdomen is soft.     Tenderness: There is no abdominal tenderness.  Musculoskeletal:     Cervical back: Normal range of motion and neck supple.     Lumbar back: Spasms and tenderness present. Decreased range of motion.       Back:  Skin:    General: Skin is warm and dry.  Neurological:     General: No focal deficit present.     Mental Status: He is alert and oriented to person, place, and time.     Sensory: No sensory deficit.     Motor: No weakness.     Coordination: Coordination normal.     Deep Tendon Reflexes: Reflexes normal.  Psychiatric:        Mood and Affect: Mood normal.        Behavior: Behavior normal.    A total of 30 minutes was spent with the patient, greater than 50% of which was in counseling/coordination of care regarding chronic medical problems, treatment and management including medications diet and nutrition, review of most recent office visit notes, review of most recent blood work results, differential diagnosis of lumbar pain, treatment, and medication including Tylenol and occasional ibuprofen, prognosis and need for follow-up.   ASSESSMENT & PLAN: Clinically stable.  No medical concerns identified during this visit.  Continue present medications.  No changes.  Jory was seen today for hypertension and hip pain.  Diagnoses and all orders for this visit:  Essential hypertension -     CBC with Differential/Platelet -     Comprehensive metabolic  panel  Dyslipidemia -     Lipid panel  Prediabetes -     CBC with Differential/Platelet -     Hemoglobin A1c  Malignant neoplasm of prostate (HCC)  Acute myofascial strain of lumbar region, initial encounter    Patient Instructions       If you have lab work done today you will be contacted with your lab results within the next 2 weeks.  If you have not heard from [place] then please contact us. The fastest way to get your results is to register for My Chart.   IF you received an x-ray today, you will receive an invoice from [facility]. Please contact [facility] at [phone] with questions or concerns regarding your invoice.   IF you received labwork today, you will receive an invoice from [facility]. Please contact [facility] at [phone] with questions or concerns regarding your invoice.   Our billing staff will not be able to assist you with questions regarding bills from these companies.  You will be contacted with the lab results as soon as they are available. The fastest way to get your results is to activate your My Chart account. Instructions are located on the last page of this paperwork. If you have not heard from [place] regarding the results in 2 weeks, please contact this office.     Health Maintenance After Age 78 After age 62, you are at a higher risk for certain long-term diseases and infections as well as injuries from falls. Falls are a major cause of broken bones and head injuries in people who are older than age 108. Getting regular preventive care can help to keep you healthy and well. Preventive care includes getting regular testing and making  lifestyle changes as recommended by your health care provider. Talk with your health care provider about:  Which screenings and tests you should have. A screening is a test that checks for a disease when you have no symptoms.  A diet and exercise plan that is right for you. What should I know about  screenings and tests to prevent falls? Screening and testing are the best ways to find a health problem early. Early diagnosis and treatment give you the best chance of managing medical conditions that are common after age 36. Certain conditions and lifestyle choices may make you more likely to have a fall. Your health care provider may recommend:  Regular vision checks. Poor vision and conditions such as cataracts can make you more likely to have a fall. If you wear glasses, make sure to get your prescription updated if your vision changes.  Medicine review. Work with your health care provider to regularly review all of the medicines you are taking, including over-the-counter medicines. Ask your health care provider about any side effects that may make you more likely to have a fall. Tell your health care provider if any medicines that you take make you feel dizzy or sleepy.  Osteoporosis screening. Osteoporosis is a condition that causes the bones to get weaker. This can make the bones weak and cause them to break more easily.  Blood pressure screening. Blood pressure changes and medicines to control blood pressure can make you feel dizzy.  Strength and balance checks. Your health care provider may recommend certain tests to check your strength and balance while standing, walking, or changing positions.  Foot health exam. Foot pain and numbness, as well as not wearing proper footwear, can make you more likely to have a fall.  Depression screening. You may be more likely to have a fall if you have a fear of falling, feel emotionally low, or feel unable to do activities that you used to do.  Alcohol use screening. Using too much alcohol can affect your balance and may make you more likely to have a fall. What actions can I take to lower my risk of falls? General instructions  Talk with your health care provider about your risks for falling. Tell your health care provider if: ? You fall. Be sure  to tell your health care provider about all falls, even ones that seem minor. ? You feel dizzy, sleepy, or off-balance.  Take over-the-counter and prescription medicines only as told by your health care provider. These include any supplements.  Eat a healthy diet and maintain a healthy weight. A healthy diet includes low-fat dairy products, low-fat (lean) meats, and fiber from whole grains, beans, and lots of fruits and vegetables. Home safety  Remove any tripping hazards, such as rugs, cords, and clutter.  Install safety equipment such as grab bars in bathrooms and safety rails on stairs.  Keep rooms and walkways well-lit. Activity   Follow a regular exercise program to stay fit. This will help you maintain your balance. Ask your health care provider what types of exercise are appropriate for you.  If you need a cane or walker, use it as recommended by your health care provider.  Wear supportive shoes that have nonskid soles. Lifestyle  Do not drink alcohol if your health care provider tells you not to drink.  If you drink alcohol, limit how much you have: ? 0-1 drink a day for women. ? 0-2 drinks a day for men.  Be aware of how much  alcohol is in your drink. In the [place] one drink equals one typical bottle of beer (12 oz), one-half glass of wine (5 oz), or one shot of hard liquor (1 oz).  Do not use any products that contain nicotine or tobacco, such as cigarettes and e-cigarettes. If you need help quitting, ask your health care provider. Summary  Having a healthy lifestyle and getting preventive care can help to protect your health and wellness after age 78.  Screening and testing are the best way to find a health problem early and help you avoid having a fall. Early diagnosis and treatment give you the best chance for managing medical conditions that are more common for people who are older than age 58.  Falls are a major cause of broken bones and head injuries in people  who are older than age 29. Take precautions to prevent a fall at home.  Work with your health care provider to learn what changes you can make to improve your health and wellness and to prevent falls. This information is not intended to replace advice given to you by your health care provider. Make sure you discuss any questions you have with your health care provider. Document Revised: 09/17/2018 Document Reviewed: 04/09/2017 Elsevier Patient Education  2020 [place].  Lumbar Strain A lumbar strain, which is sometimes called a low-back strain, is a stretch or tear in a muscle or the strong cords of tissue that attach muscle to bone (tendons) in the lower back (lumbar spine). This type of injury occurs when muscles or tendons are torn or are stretched beyond their limits. Lumbar strains can range from mild to severe. Mild strains may involve stretching a muscle or tendon without tearing it. These may heal in 1-2 weeks. More severe strains involve tearing of muscle fibers or tendons. These will cause more pain and may take 6-8 weeks to heal. What are the causes? This condition may be caused by:  Trauma, such as a fall or a hit to the body.  Twisting or overstretching the back. This may result from doing activities that need a lot of energy, such as lifting heavy objects. What increases the risk? This injury is more common in:  Athletes.  People with obesity.  People who do repeated lifting, bending, or other movements that involve their back. What are the signs or symptoms? Symptoms of this condition may include:  Sharp or dull pain in the lower back that does not go away. The pain may extend to the buttocks.  Stiffness or limited range of motion.  Sudden muscle tightening (spasms). How is this diagnosed? This condition may be diagnosed based on:  Your symptoms.  Your medical history.  A physical exam.  Imaging tests, such as: ? X-rays. ? MRI. How is this  treated? Treatment for this condition may include:  Rest.  Applying heat and cold to the affected area.  Over-the-counter medicines to help relieve pain and inflammation, such as NSAIDs.  Prescription pain medicine and muscle relaxants may be needed for a short time.  Physical therapy. Follow these instructions at home: Managing pain, stiffness, and swelling      If directed, put ice on the injured area during the first 24 hours after your injury. ? Put ice in a plastic bag. ? Place a towel between your skin and the bag. ? Leave the ice on for 20 minutes, 2-3 times a day.  If directed, apply heat to the affected area as often as told by  your health care provider. Use the heat source that your health care provider recommends, such as a moist heat pack or a heating pad. ? Place a towel between your skin and the heat source. ? Leave the heat on for 20-30 minutes. ? Remove the heat if your skin turns bright red. This is especially important if you are unable to feel pain, heat, or cold. You may have a greater risk of getting burned. Activity  Rest and return to your normal activities as told by your health care provider. Ask your health care provider what activities are safe for you.  Do exercises as told by your health care provider. Medicines  Take over-the-counter and prescription medicines only as told by your health care provider.  Ask your health care provider if the medicine prescribed to you: ? Requires you to avoid driving or using heavy machinery. ? Can cause constipation. You may need to take these actions to prevent or treat constipation:  Drink enough fluid to keep your urine pale yellow.  Take over-the-counter or prescription medicines.  Eat foods that are high in fiber, such as beans, whole grains, and fresh fruits and vegetables.  Limit foods that are high in fat and processed sugars, such as fried or sweet foods. Injury prevention To prevent a future  low-back injury:  Always warm up properly before physical activity or sports.  Cool down and stretch after being active.  Use correct form when playing sports and lifting heavy objects. Bend your knees before you lift heavy objects.  Use good posture when sitting and standing.  Stay physically fit and keep a healthy weight. ? Do at least 150 minutes of moderate-intensity exercise each week, such as brisk walking or water aerobics. ? Do strength exercises at least 2 times each week.  General instructions  Do not use any products that contain nicotine or tobacco, such as cigarettes, e-cigarettes, and chewing tobacco. If you need help quitting, ask your health care provider.  Keep all follow-up visits as told by your health care provider. This is important. Contact a health care provider if:  Your back pain does not improve after 6 weeks of treatment.  Your symptoms get worse. Get help right away if:  Your back pain is severe.  You are unable to stand or walk.  You develop pain in your legs.  You develop weakness in your buttocks or legs.  You have difficulty controlling when you urinate or when you have a bowel movement. ? You have frequent, painful, or bloody urination. ? You have a temperature over 101.16F (38.3C) Summary  A lumbar strain, which is sometimes called a low-back strain, is a stretch or tear in a muscle or the strong cords of tissue that attach muscle to bone (tendons) in the lower back (lumbar spine).  This type of injury occurs when muscles or tendons are torn or are stretched beyond their limits.  Rest and return to your normal activities as told by your health care provider. If directed, apply heat and ice to the affected area as often as told by your health care provider.  Take over-the-counter and prescription medicines only as told by your health care provider.  Contact a health care provider if you have new or worsening symptoms. This information  is not intended to replace advice given to you by your health care provider. Make sure you discuss any questions you have with your health care provider. Document Revised: 03/26/2018 Document Reviewed: 03/26/2018 Elsevier Patient Education  [place], MD [facility]

## 2019-07-12 NOTE — Patient Instructions (Addendum)
   If you have lab work done today you will be contacted with your lab results within the next 2 weeks.  If you have not heard from us then please contact us. The fastest way to get your results is to register for My Chart.   IF you received an x-ray today, you will receive an invoice from [facility]. Please contact [facility] at [phone] with questions or concerns regarding your invoice.   IF you received labwork today, you will receive an invoice from [facility]. Please contact [facility] at [phone] with questions or concerns regarding your invoice.   Our billing staff will not be able to assist you with questions regarding bills from these companies.  You will be contacted with the lab results as soon as they are available. The fastest way to get your results is to activate your My Chart account. Instructions are located on the last page of this paperwork. If you have not heard from us regarding the results in 2 weeks, please contact this office.     Health Maintenance After Age 65 After age 65, you are at a higher risk for certain long-term diseases and infections as well as injuries from falls. Falls are a major cause of broken bones and head injuries in people who are older than age 65. Getting regular preventive care can help to keep you healthy and well. Preventive care includes getting regular testing and making lifestyle changes as recommended by your health care provider. Talk with your health care provider about:  Which screenings and tests you should have. A screening is a test that checks for a disease when you have no symptoms.  A diet and exercise plan that is right for you. What should I know about screenings and tests to prevent falls? Screening and testing are the best ways to find a health problem early. Early diagnosis and treatment give you the best chance of managing medical conditions that are common after age 65. Certain conditions and  lifestyle choices may make you more likely to have a fall. Your health care provider may recommend:  Regular vision checks. Poor vision and conditions such as cataracts can make you more likely to have a fall. If you wear glasses, make sure to get your prescription updated if your vision changes.  Medicine review. Work with your health care provider to regularly review all of the medicines you are taking, including over-the-counter medicines. Ask your health care provider about any side effects that may make you more likely to have a fall. Tell your health care provider if any medicines that you take make you feel dizzy or sleepy.  Osteoporosis screening. Osteoporosis is a condition that causes the bones to get weaker. This can make the bones weak and cause them to break more easily.  Blood pressure screening. Blood pressure changes and medicines to control blood pressure can make you feel dizzy.  Strength and balance checks. Your health care provider may recommend certain tests to check your strength and balance while standing, walking, or changing positions.  Foot health exam. Foot pain and numbness, as well as not wearing proper footwear, can make you more likely to have a fall.  Depression screening. You may be more likely to have a fall if you have a fear of falling, feel emotionally low, or feel unable to do activities that you used to do.  Alcohol use screening. Using too much alcohol can affect your balance and may make you more likely to   have a fall. What actions can I take to lower my risk of falls? General instructions  Talk with your health care provider about your risks for falling. Tell your health care provider if: ? You fall. Be sure to tell your health care provider about all falls, even ones that seem minor. ? You feel dizzy, sleepy, or off-balance.  Take over-the-counter and prescription medicines only as told by your health care provider. These include any  supplements.  Eat a healthy diet and maintain a healthy weight. A healthy diet includes low-fat dairy products, low-fat (lean) meats, and fiber from whole grains, beans, and lots of fruits and vegetables. Home safety  Remove any tripping hazards, such as rugs, cords, and clutter.  Install safety equipment such as grab bars in bathrooms and safety rails on stairs.  Keep rooms and walkways well-lit. Activity   Follow a regular exercise program to stay fit. This will help you maintain your balance. Ask your health care provider what types of exercise are appropriate for you.  If you need a cane or walker, use it as recommended by your health care provider.  Wear supportive shoes that have nonskid soles. Lifestyle  Do not drink alcohol if your health care provider tells you not to drink.  If you drink alcohol, limit how much you have: ? 0-1 drink a day for women. ? 0-2 drinks a day for men.  Be aware of how much alcohol is in your drink. In the [place] one drink equals one typical bottle of beer (12 oz), one-half glass of wine (5 oz), or one shot of hard liquor (1 oz).  Do not use any products that contain nicotine or tobacco, such as cigarettes and e-cigarettes. If you need help quitting, ask your health care provider. Summary  Having a healthy lifestyle and getting preventive care can help to protect your health and wellness after age 77.  Screening and testing are the best way to find a health problem early and help you avoid having a fall. Early diagnosis and treatment give you the best chance for managing medical conditions that are more common for people who are older than age 73.  Falls are a major cause of broken bones and head injuries in people who are older than age 97. Take precautions to prevent a fall at home.  Work with your health care provider to learn what changes you can make to improve your health and wellness and to prevent falls. This information is not intended  to replace advice given to you by your health care provider. Make sure you discuss any questions you have with your health care provider. Document Revised: 09/17/2018 Document Reviewed: 04/09/2017 Elsevier Patient Education  2020 [place].  Lumbar Strain A lumbar strain, which is sometimes called a low-back strain, is a stretch or tear in a muscle or the strong cords of tissue that attach muscle to bone (tendons) in the lower back (lumbar spine). This type of injury occurs when muscles or tendons are torn or are stretched beyond their limits. Lumbar strains can range from mild to severe. Mild strains may involve stretching a muscle or tendon without tearing it. These may heal in 1-2 weeks. More severe strains involve tearing of muscle fibers or tendons. These will cause more pain and may take 6-8 weeks to heal. What are the causes? This condition may be caused by:  Trauma, such as a fall or a hit to the body.  Twisting or overstretching the back. This  may result from doing activities that need a lot of energy, such as lifting heavy objects. What increases the risk? This injury is more common in:  Athletes.  People with obesity.  People who do repeated lifting, bending, or other movements that involve their back. What are the signs or symptoms? Symptoms of this condition may include:  Sharp or dull pain in the lower back that does not go away. The pain may extend to the buttocks.  Stiffness or limited range of motion.  Sudden muscle tightening (spasms). How is this diagnosed? This condition may be diagnosed based on:  Your symptoms.  Your medical history.  A physical exam.  Imaging tests, such as: ? X-rays. ? MRI. How is this treated? Treatment for this condition may include:  Rest.  Applying heat and cold to the affected area.  Over-the-counter medicines to help relieve pain and inflammation, such as NSAIDs.  Prescription pain medicine and muscle relaxants may  be needed for a short time.  Physical therapy. Follow these instructions at home: Managing pain, stiffness, and swelling      If directed, put ice on the injured area during the first 24 hours after your injury. ? Put ice in a plastic bag. ? Place a towel between your skin and the bag. ? Leave the ice on for 20 minutes, 2-3 times a day.  If directed, apply heat to the affected area as often as told by your health care provider. Use the heat source that your health care provider recommends, such as a moist heat pack or a heating pad. ? Place a towel between your skin and the heat source. ? Leave the heat on for 20-30 minutes. ? Remove the heat if your skin turns bright red. This is especially important if you are unable to feel pain, heat, or cold. You may have a greater risk of getting burned. Activity  Rest and return to your normal activities as told by your health care provider. Ask your health care provider what activities are safe for you.  Do exercises as told by your health care provider. Medicines  Take over-the-counter and prescription medicines only as told by your health care provider.  Ask your health care provider if the medicine prescribed to you: ? Requires you to avoid driving or using heavy machinery. ? Can cause constipation. You may need to take these actions to prevent or treat constipation:  Drink enough fluid to keep your urine pale yellow.  Take over-the-counter or prescription medicines.  Eat foods that are high in fiber, such as beans, whole grains, and fresh fruits and vegetables.  Limit foods that are high in fat and processed sugars, such as fried or sweet foods. Injury prevention To prevent a future low-back injury:  Always warm up properly before physical activity or sports.  Cool down and stretch after being active.  Use correct form when playing sports and lifting heavy objects. Bend your knees before you lift heavy objects.  Use good  posture when sitting and standing.  Stay physically fit and keep a healthy weight. ? Do at least 150 minutes of moderate-intensity exercise each week, such as brisk walking or water aerobics. ? Do strength exercises at least 2 times each week.  General instructions  Do not use any products that contain nicotine or tobacco, such as cigarettes, e-cigarettes, and chewing tobacco. If you need help quitting, ask your health care provider.  Keep all follow-up visits as told by your health care provider. This is  important. Contact a health care provider if:  Your back pain does not improve after 6 weeks of treatment.  Your symptoms get worse. Get help right away if:  Your back pain is severe.  You are unable to stand or walk.  You develop pain in your legs.  You develop weakness in your buttocks or legs.  You have difficulty controlling when you urinate or when you have a bowel movement. ? You have frequent, painful, or bloody urination. ? You have a temperature over 101.110F (38.3C) Summary  A lumbar strain, which is sometimes called a low-back strain, is a stretch or tear in a muscle or the strong cords of tissue that attach muscle to bone (tendons) in the lower back (lumbar spine).  This type of injury occurs when muscles or tendons are torn or are stretched beyond their limits.  Rest and return to your normal activities as told by your health care provider. If directed, apply heat and ice to the affected area as often as told by your health care provider.  Take over-the-counter and prescription medicines only as told by your health care provider.  Contact a health care provider if you have new or worsening symptoms. This information is not intended to replace advice given to you by your health care provider. Make sure you discuss any questions you have with your health care provider. Document Revised: 03/26/2018 Document Reviewed: 03/26/2018 Elsevier Patient Education  [place].

## 2019-07-13 LAB — LIPID PANEL
Chol/HDL Ratio: 3.3 ratio (ref 0.0–5.0)
Cholesterol, Total: 137 mg/dL (ref 100–199)
HDL: 41 mg/dL (ref >39)
LDL Chol Calc (NIH): 68 mg/dL (ref 0–99)
Triglycerides: 165 mg/dL — ABNORMAL HIGH (ref 0–149)
VLDL Cholesterol Cal: 28 mg/dL (ref 5–40)

## 2019-07-13 LAB — COMPREHENSIVE METABOLIC PANEL
ALT: 22 IU/L (ref 0–44)
AST: 22 IU/L (ref 0–40)
Albumin/Globulin Ratio: 1.4 (ref 1.2–2.2)
Albumin: 4.3 g/dL (ref 3.8–4.8)
Alkaline Phosphatase: 67 IU/L (ref 39–117)
BUN/Creatinine Ratio: 15 (ref 10–24)
BUN: 19 mg/dL (ref 8–27)
Bilirubin Total: 0.3 mg/dL (ref 0.0–1.2)
CO2: 23 mmol/L (ref 20–29)
Calcium: 10 mg/dL (ref 8.6–10.2)
Chloride: 103 mmol/L (ref 96–106)
Creatinine, Ser: 1.3 mg/dL — ABNORMAL HIGH (ref 0.76–1.27)
GFR calc Af Amer: 66 mL/min/{1.73_m2} (ref >59)
GFR calc non Af Amer: 57 mL/min/{1.73_m2} — ABNORMAL LOW (ref >59)
Globulin, Total: 3.1 g/dL (ref 1.5–4.5)
Glucose: 98 mg/dL (ref 65–99)
Potassium: 4 mmol/L (ref 3.5–5.2)
Sodium: 141 mmol/L (ref 134–144)
Total Protein: 7.4 g/dL (ref 6.0–8.5)

## 2019-07-13 LAB — CBC WITH DIFFERENTIAL/PLATELET
Basophils Absolute: 0 10*3/uL (ref 0.0–0.2)
Basos: 0 %
EOS (ABSOLUTE): 0.1 10*3/uL (ref 0.0–0.4)
Eos: 2 %
Hematocrit: 42.5 % (ref 37.5–51.0)
Hemoglobin: 13.7 g/dL (ref 13.0–17.7)
Immature Grans (Abs): 0 10*3/uL (ref 0.0–0.1)
Immature Granulocytes: 0 %
Lymphocytes Absolute: 1.9 10*3/uL (ref 0.7–3.1)
Lymphs: 28 %
MCH: 27 pg (ref 26.6–33.0)
MCHC: 32.2 g/dL (ref 31.5–35.7)
MCV: 84 fL (ref 79–97)
Monocytes Absolute: 0.4 10*3/uL (ref 0.1–0.9)
Monocytes: 6 %
Neutrophils Absolute: 4.3 10*3/uL (ref 1.4–7.0)
Neutrophils: 64 %
Platelets: 202 10*3/uL (ref 150–450)
RBC: 5.07 x10E6/uL (ref 4.14–5.80)
RDW: 14.4 % (ref 11.6–15.4)
WBC: 6.8 10*3/uL (ref 3.4–10.8)

## 2019-07-13 LAB — HEMOGLOBIN A1C
Est. average glucose Bld gHb Est-mCnc: 140 mg/dL
Hgb A1c MFr Bld: 6.5 % — ABNORMAL HIGH (ref 4.8–5.6)

## 2019-08-27 ENCOUNTER — Other Ambulatory Visit: Payer: Self-pay | Admitting: Urology

## 2019-08-27 MED ORDER — LORAZEPAM 1 MG PO TABS: 1.0000 mg | ORAL_TABLET | ORAL | 0 refills | Status: DC | PRN

## 2019-08-30 ENCOUNTER — Telehealth: Payer: Self-pay | Admitting: *Deleted

## 2019-08-30 NOTE — Telephone Encounter (Signed)
CALLED PATIENT TO REMIND OF SIM AND MRI APPT. FOR 08-31-19, SPOKE WITH PATIENT AND HE IS AWARE OF THESE APPTS.

## 2019-08-31 ENCOUNTER — Ambulatory Visit (HOSPITAL_COMMUNITY)
Admission: RE | Admit: 2019-08-31 | Discharge: 2019-08-31 | Disposition: A | Discharge status: Home / Self Care | Payer: Medicare PPO | Source: Ambulatory Visit | Attending: Urology | Admitting: Urology

## 2019-08-31 ENCOUNTER — Ambulatory Visit
Admission: RE | Admit: 2019-08-31 | Discharge: 2019-08-31 | Disposition: A | Discharge status: Home / Self Care | Payer: Medicare PPO | Source: Ambulatory Visit | Attending: Radiation Oncology | Admitting: Radiation Oncology

## 2019-08-31 ENCOUNTER — Other Ambulatory Visit: Payer: Self-pay

## 2019-08-31 ENCOUNTER — Encounter: Payer: Self-pay | Admitting: Medical Oncology

## 2019-08-31 DIAGNOSIS — C61 Malignant neoplasm of prostate: Secondary | ICD-10-CM | POA: Insufficient documentation

## 2019-09-01 NOTE — Progress Notes (Signed)
  Radiation Oncology         [phone]) [phone] ________________________________  Name: [name] [name] MRN: [id number]  Date: 08/31/2019  DOB: [birth year]  SIMULATION AND TREATMENT PLANNING NOTE    ICD-10-CM   1. Malignant neoplasm of prostate ([place])  C61     DIAGNOSIS:  69 y.o. gentleman with Stage T1c adenocarcinoma of the prostate with Gleason score of 4+3, and PSA of 14.2  NARRATIVE:  The patient was brought to the [facility].  Identity was confirmed.  All relevant records and images related to the planned course of therapy were reviewed.  The patient freely provided informed written consent to proceed with treatment after reviewing the details related to the planned course of therapy. The consent form was witnessed and verified by the simulation staff.  Then, the patient was set-up in a stable reproducible supine position for radiation therapy.  A vacuum lock pillow device was custom fabricated to position his legs in a reproducible immobilized position.  Then, I performed a urethrogram under sterile conditions to identify the prostatic apex.  CT images were obtained.  Surface markings were placed.  The CT images were loaded into the planning software.  Then the prostate target and avoidance structures including the rectum, bladder, bowel and hips were contoured.  Treatment planning then occurred.  The radiation prescription was entered and confirmed.  A total of one complex treatment devices was fabricated. I have requested : Intensity Modulated Radiotherapy (IMRT) is medically necessary for this case for the following reason:  Rectal sparing.[name] Kitchen  PLAN:  The patient will receive 70 Gy in 28 fractions.  ________________________________  Sheral Apley Tammi Klippel, M.D.

## 2019-09-02 DIAGNOSIS — C61 Malignant neoplasm of prostate: Secondary | ICD-10-CM | POA: Diagnosis not present

## 2019-09-09 ENCOUNTER — Ambulatory Visit
Admission: RE | Admit: 2019-09-09 | Discharge: 2019-09-09 | Disposition: A | Discharge status: Home / Self Care | Payer: Medicare PPO | Source: Ambulatory Visit | Attending: Radiation Oncology | Admitting: Radiation Oncology

## 2019-09-09 ENCOUNTER — Other Ambulatory Visit: Payer: Self-pay

## 2019-09-09 DIAGNOSIS — C61 Malignant neoplasm of prostate: Secondary | ICD-10-CM | POA: Insufficient documentation

## 2019-09-09 DIAGNOSIS — R351 Nocturia: Secondary | ICD-10-CM | POA: Insufficient documentation

## 2019-09-10 ENCOUNTER — Encounter: Payer: Self-pay | Admitting: Medical Oncology

## 2019-09-10 ENCOUNTER — Ambulatory Visit
Admission: RE | Admit: 2019-09-10 | Discharge: 2019-09-10 | Disposition: A | Discharge status: Home / Self Care | Payer: Medicare PPO | Source: Ambulatory Visit | Attending: Radiation Oncology | Admitting: Radiation Oncology

## 2019-09-10 ENCOUNTER — Other Ambulatory Visit: Payer: Self-pay

## 2019-09-10 DIAGNOSIS — C61 Malignant neoplasm of prostate: Secondary | ICD-10-CM | POA: Diagnosis not present

## 2019-09-10 DIAGNOSIS — R351 Nocturia: Secondary | ICD-10-CM | POA: Diagnosis not present

## 2019-09-13 ENCOUNTER — Other Ambulatory Visit: Payer: Self-pay

## 2019-09-13 ENCOUNTER — Ambulatory Visit
Admission: RE | Admit: 2019-09-13 | Discharge: 2019-09-13 | Disposition: A | Discharge status: Home / Self Care | Payer: Medicare PPO | Source: Ambulatory Visit | Attending: Radiation Oncology | Admitting: Radiation Oncology

## 2019-09-13 DIAGNOSIS — R351 Nocturia: Secondary | ICD-10-CM | POA: Diagnosis not present

## 2019-09-13 DIAGNOSIS — C61 Malignant neoplasm of prostate: Secondary | ICD-10-CM | POA: Diagnosis not present

## 2019-09-14 ENCOUNTER — Ambulatory Visit
Admission: RE | Admit: 2019-09-14 | Discharge: 2019-09-14 | Disposition: A | Discharge status: Home / Self Care | Payer: Medicare PPO | Source: Ambulatory Visit | Attending: Radiation Oncology | Admitting: Radiation Oncology

## 2019-09-14 ENCOUNTER — Other Ambulatory Visit: Payer: Self-pay

## 2019-09-14 DIAGNOSIS — C61 Malignant neoplasm of prostate: Secondary | ICD-10-CM | POA: Diagnosis not present

## 2019-09-14 DIAGNOSIS — R351 Nocturia: Secondary | ICD-10-CM | POA: Diagnosis not present

## 2019-09-14 LAB — URINALYSIS, COMPLETE (UACMP) WITH MICROSCOPIC
Bilirubin Urine: NEGATIVE
Glucose, UA: NEGATIVE mg/dL
Ketones, ur: NEGATIVE mg/dL
Nitrite: NEGATIVE
Protein, ur: NEGATIVE mg/dL
Specific Gravity, Urine: 1.013 (ref 1.005–1.030)
pH: 5 (ref 5.0–8.0)

## 2019-09-15 ENCOUNTER — Ambulatory Visit
Admission: RE | Admit: 2019-09-15 | Discharge: 2019-09-15 | Disposition: A | Discharge status: Home / Self Care | Payer: Medicare PPO | Source: Ambulatory Visit | Attending: Radiation Oncology | Admitting: Radiation Oncology

## 2019-09-15 ENCOUNTER — Other Ambulatory Visit: Payer: Self-pay | Admitting: Emergency Medicine

## 2019-09-15 ENCOUNTER — Telehealth: Payer: Self-pay | Admitting: Radiation Oncology

## 2019-09-15 ENCOUNTER — Other Ambulatory Visit: Payer: Self-pay

## 2019-09-15 DIAGNOSIS — R351 Nocturia: Secondary | ICD-10-CM | POA: Diagnosis not present

## 2019-09-15 DIAGNOSIS — I10 Essential (primary) hypertension: Secondary | ICD-10-CM

## 2019-09-15 DIAGNOSIS — C61 Malignant neoplasm of prostate: Secondary | ICD-10-CM | POA: Diagnosis not present

## 2019-09-15 LAB — URINE CULTURE: Culture: NO GROWTH

## 2019-09-15 NOTE — Progress Notes (Signed)
Please call patient with normal result.  Thanks. MM 

## 2019-09-15 NOTE — Telephone Encounter (Signed)
Phoned patient as directed by Dr. Tammi Klippel. Explained his urinalysis and culture does not show an infection. Instructed patient to begin OTC AZO to manage his dysuria and to increase his water intake. Explained that if the AZO doesn't help or stops working he should notify this RN for a prescription. Patient verbalized understanding of all reviewed and appreciation for the call.

## 2019-09-15 NOTE — Telephone Encounter (Signed)
Requested Prescriptions  Pending Prescriptions Disp Refills  . lisinopril-hydrochlorothiazide (ZESTORETIC) 10-12.5 MG tablet [Pharmacy Med Name: LISINOPRIL-HCTZ 10-12.5 MG TAB] 90 tablet 1    Sig: TAKE 1 TABLET BY MOUTH EVERY DAY     Cardiovascular:  ACEI + Diuretic Combos Failed - 09/15/2019  9:30 AM      Failed - Cr in normal range and within 180 days    Creat  Date Value Ref Range Status  12/06/2013 1.13 0.50 - 1.35 mg/dL Final   Creatinine, Ser  Date Value Ref Range Status  07/12/2019 1.30 (H) 0.76 - 1.27 mg/dL Final         Passed - Na in normal range and within 180 days    Sodium  Date Value Ref Range Status  07/12/2019 141 134 - 144 mmol/L Final         Passed - K in normal range and within 180 days    Potassium  Date Value Ref Range Status  07/12/2019 4.0 3.5 - 5.2 mmol/L Final         Passed - Ca in normal range and within 180 days    Calcium  Date Value Ref Range Status  07/12/2019 10.0 8.6 - 10.2 mg/dL Final         Passed - Patient is not pregnant      Passed - Last BP in normal range    BP Readings from Last 1 Encounters:  07/12/19 131/76         Passed - Valid encounter within last 6 months    Recent Outpatient Visits          2 months ago Essential hypertension   [facility] at [place], Ines Bloomer, MD   3 months ago Acute non-recurrent maxillary sinusitis   [facility] at [place], Ines Bloomer, MD   1 year ago Hypertension, unspecified type   Primary Care at [facility], Ines Bloomer, MD   1 year ago Hypertension, unspecified type   Primary Care at [facility], Ines Bloomer, MD   1 year ago Lower abdominal pain   Primary Care at [facility], Ines Bloomer, MD      Future Appointments            In 3 months [place], Ines Bloomer, MD Primary Care at [place], [facility]

## 2019-09-15 NOTE — Telephone Encounter (Signed)
-----   Message from Tyler Pita, MD sent at 09/15/2019 12:51 PM EDT ----- Please call patient with normal result.  Thanks. MM

## 2019-09-16 ENCOUNTER — Other Ambulatory Visit: Payer: Self-pay

## 2019-09-16 ENCOUNTER — Ambulatory Visit
Admission: RE | Admit: 2019-09-16 | Discharge: 2019-09-16 | Disposition: A | Discharge status: Home / Self Care | Payer: Medicare PPO | Source: Ambulatory Visit | Attending: Radiation Oncology | Admitting: Radiation Oncology

## 2019-09-16 DIAGNOSIS — C61 Malignant neoplasm of prostate: Secondary | ICD-10-CM | POA: Diagnosis not present

## 2019-09-16 DIAGNOSIS — R351 Nocturia: Secondary | ICD-10-CM | POA: Diagnosis not present

## 2019-09-17 ENCOUNTER — Other Ambulatory Visit: Payer: Self-pay

## 2019-09-17 ENCOUNTER — Ambulatory Visit
Admission: RE | Admit: 2019-09-17 | Discharge: 2019-09-17 | Disposition: A | Discharge status: Home / Self Care | Payer: Medicare PPO | Source: Ambulatory Visit | Attending: Radiation Oncology | Admitting: Radiation Oncology

## 2019-09-17 DIAGNOSIS — C61 Malignant neoplasm of prostate: Secondary | ICD-10-CM | POA: Diagnosis not present

## 2019-09-17 DIAGNOSIS — R351 Nocturia: Secondary | ICD-10-CM | POA: Diagnosis not present

## 2019-09-19 ENCOUNTER — Other Ambulatory Visit: Payer: Self-pay | Admitting: Emergency Medicine

## 2019-09-19 DIAGNOSIS — E785 Hyperlipidemia, unspecified: Secondary | ICD-10-CM

## 2019-09-19 NOTE — Telephone Encounter (Signed)
Requested Prescriptions  Pending Prescriptions Disp Refills  . rosuvastatin (CRESTOR) 10 MG tablet [Pharmacy Med Name: ROSUVASTATIN CALCIUM 10 MG TAB] 90 tablet 3    Sig: TAKE 1 TABLET BY MOUTH EVERY DAY     Cardiovascular:  Antilipid - Statins Failed - 09/19/2019  9:49 AM      Failed - LDL in normal range and within 360 days    LDL Chol Calc (NIH)  Date Value Ref Range Status  07/12/2019 68 0 - 99 mg/dL Final         Failed - Triglycerides in normal range and within 360 days    Triglycerides  Date Value Ref Range Status  07/12/2019 165 (H) 0 - 149 mg/dL Final         Passed - Total Cholesterol in normal range and within 360 days    Cholesterol, Total  Date Value Ref Range Status  07/12/2019 137 100 - 199 mg/dL Final         Passed - HDL in normal range and within 360 days    HDL  Date Value Ref Range Status  07/12/2019 41 >39 mg/dL Final         Passed - Patient is not pregnant      Passed - Valid encounter within last 12 months    Recent Outpatient Visits          2 months ago Essential hypertension   [facility] at [place], [place], MD   3 months ago Acute non-recurrent maxillary sinusitis   [facility] at [place], Ines Bloomer, MD   1 year ago Hypertension, unspecified type   Primary Care at [facility], Ines Bloomer, MD   1 year ago Hypertension, unspecified type   Primary Care at [facility], Ines Bloomer, MD   1 year ago Lower abdominal pain   Primary Care at [facility], Ines Bloomer, MD      Future Appointments            In 3 months [place], Ines Bloomer, MD Primary Care at [place], [facility]

## 2019-09-20 ENCOUNTER — Other Ambulatory Visit: Payer: Self-pay

## 2019-09-20 ENCOUNTER — Ambulatory Visit
Admission: RE | Admit: 2019-09-20 | Discharge: 2019-09-20 | Disposition: A | Discharge status: Home / Self Care | Payer: Medicare PPO | Source: Ambulatory Visit | Attending: Radiation Oncology | Admitting: Radiation Oncology

## 2019-09-20 DIAGNOSIS — R351 Nocturia: Secondary | ICD-10-CM | POA: Diagnosis not present

## 2019-09-20 DIAGNOSIS — C61 Malignant neoplasm of prostate: Secondary | ICD-10-CM | POA: Diagnosis not present

## 2019-09-21 ENCOUNTER — Other Ambulatory Visit: Payer: Self-pay

## 2019-09-21 ENCOUNTER — Ambulatory Visit
Admission: RE | Admit: 2019-09-21 | Discharge: 2019-09-21 | Disposition: A | Discharge status: Home / Self Care | Payer: Medicare PPO | Source: Ambulatory Visit | Attending: Radiation Oncology | Admitting: Radiation Oncology

## 2019-09-21 DIAGNOSIS — C61 Malignant neoplasm of prostate: Secondary | ICD-10-CM | POA: Diagnosis not present

## 2019-09-21 DIAGNOSIS — R351 Nocturia: Secondary | ICD-10-CM | POA: Diagnosis not present

## 2019-09-22 ENCOUNTER — Other Ambulatory Visit: Payer: Self-pay

## 2019-09-22 ENCOUNTER — Ambulatory Visit
Admission: RE | Admit: 2019-09-22 | Discharge: 2019-09-22 | Disposition: A | Discharge status: Home / Self Care | Payer: Medicare PPO | Source: Ambulatory Visit | Attending: Radiation Oncology | Admitting: Radiation Oncology

## 2019-09-22 DIAGNOSIS — R351 Nocturia: Secondary | ICD-10-CM | POA: Diagnosis not present

## 2019-09-22 DIAGNOSIS — C61 Malignant neoplasm of prostate: Secondary | ICD-10-CM | POA: Diagnosis not present

## 2019-09-23 ENCOUNTER — Ambulatory Visit
Admission: RE | Admit: 2019-09-23 | Discharge: 2019-09-23 | Disposition: A | Discharge status: Home / Self Care | Payer: Medicare PPO | Source: Ambulatory Visit | Attending: Radiation Oncology | Admitting: Radiation Oncology

## 2019-09-23 ENCOUNTER — Other Ambulatory Visit: Payer: Self-pay

## 2019-09-23 DIAGNOSIS — C61 Malignant neoplasm of prostate: Secondary | ICD-10-CM | POA: Diagnosis not present

## 2019-09-23 DIAGNOSIS — R351 Nocturia: Secondary | ICD-10-CM | POA: Diagnosis not present

## 2019-09-24 ENCOUNTER — Other Ambulatory Visit: Payer: Self-pay

## 2019-09-24 ENCOUNTER — Ambulatory Visit
Admission: RE | Admit: 2019-09-24 | Discharge: 2019-09-24 | Disposition: A | Discharge status: Home / Self Care | Payer: Medicare PPO | Source: Ambulatory Visit | Attending: Radiation Oncology | Admitting: Radiation Oncology

## 2019-09-24 ENCOUNTER — Other Ambulatory Visit: Payer: Self-pay | Admitting: Radiation Oncology

## 2019-09-24 DIAGNOSIS — R351 Nocturia: Secondary | ICD-10-CM | POA: Diagnosis not present

## 2019-09-24 DIAGNOSIS — C61 Malignant neoplasm of prostate: Secondary | ICD-10-CM | POA: Diagnosis not present

## 2019-09-24 MED ORDER — TAMSULOSIN HCL 0.4 MG PO CAPS: 0.4000 mg | ORAL_CAPSULE | Freq: Every day | ORAL | 5 refills | Status: DC

## 2019-09-27 ENCOUNTER — Ambulatory Visit
Admission: RE | Admit: 2019-09-27 | Discharge: 2019-09-27 | Disposition: A | Discharge status: Home / Self Care | Payer: Medicare PPO | Source: Ambulatory Visit | Attending: Radiation Oncology | Admitting: Radiation Oncology

## 2019-09-27 ENCOUNTER — Other Ambulatory Visit: Payer: Self-pay

## 2019-09-27 DIAGNOSIS — C61 Malignant neoplasm of prostate: Secondary | ICD-10-CM | POA: Diagnosis not present

## 2019-09-27 DIAGNOSIS — R351 Nocturia: Secondary | ICD-10-CM | POA: Diagnosis not present

## 2019-09-28 ENCOUNTER — Other Ambulatory Visit: Payer: Self-pay

## 2019-09-28 ENCOUNTER — Ambulatory Visit
Admission: RE | Admit: 2019-09-28 | Discharge: 2019-09-28 | Disposition: A | Discharge status: Home / Self Care | Payer: Medicare PPO | Source: Ambulatory Visit | Attending: Radiation Oncology | Admitting: Radiation Oncology

## 2019-09-28 DIAGNOSIS — C61 Malignant neoplasm of prostate: Secondary | ICD-10-CM | POA: Diagnosis not present

## 2019-09-28 DIAGNOSIS — R351 Nocturia: Secondary | ICD-10-CM | POA: Diagnosis not present

## 2019-09-29 ENCOUNTER — Ambulatory Visit
Admission: RE | Admit: 2019-09-29 | Discharge: 2019-09-29 | Disposition: A | Discharge status: Home / Self Care | Payer: Medicare PPO | Source: Ambulatory Visit | Attending: Radiation Oncology | Admitting: Radiation Oncology

## 2019-09-29 ENCOUNTER — Other Ambulatory Visit: Payer: Self-pay

## 2019-09-29 DIAGNOSIS — R351 Nocturia: Secondary | ICD-10-CM | POA: Diagnosis not present

## 2019-09-29 DIAGNOSIS — C61 Malignant neoplasm of prostate: Secondary | ICD-10-CM | POA: Diagnosis not present

## 2019-09-30 ENCOUNTER — Other Ambulatory Visit: Payer: Self-pay

## 2019-09-30 ENCOUNTER — Ambulatory Visit
Admission: RE | Admit: 2019-09-30 | Discharge: 2019-09-30 | Disposition: A | Discharge status: Home / Self Care | Payer: Medicare PPO | Source: Ambulatory Visit | Attending: Radiation Oncology | Admitting: Radiation Oncology

## 2019-09-30 DIAGNOSIS — R351 Nocturia: Secondary | ICD-10-CM | POA: Diagnosis not present

## 2019-09-30 DIAGNOSIS — C61 Malignant neoplasm of prostate: Secondary | ICD-10-CM | POA: Diagnosis not present

## 2019-10-01 ENCOUNTER — Other Ambulatory Visit: Payer: Self-pay

## 2019-10-01 ENCOUNTER — Ambulatory Visit
Admission: RE | Admit: 2019-10-01 | Discharge: 2019-10-01 | Disposition: A | Discharge status: Home / Self Care | Payer: Medicare PPO | Source: Ambulatory Visit | Attending: Radiation Oncology | Admitting: Radiation Oncology

## 2019-10-01 DIAGNOSIS — R351 Nocturia: Secondary | ICD-10-CM | POA: Diagnosis not present

## 2019-10-01 DIAGNOSIS — C61 Malignant neoplasm of prostate: Secondary | ICD-10-CM | POA: Diagnosis not present

## 2019-10-04 ENCOUNTER — Ambulatory Visit
Admission: RE | Admit: 2019-10-04 | Discharge: 2019-10-04 | Disposition: A | Discharge status: Home / Self Care | Payer: Medicare PPO | Source: Ambulatory Visit | Attending: Radiation Oncology | Admitting: Radiation Oncology

## 2019-10-04 ENCOUNTER — Other Ambulatory Visit: Payer: Self-pay

## 2019-10-04 DIAGNOSIS — C61 Malignant neoplasm of prostate: Secondary | ICD-10-CM | POA: Diagnosis not present

## 2019-10-04 DIAGNOSIS — R351 Nocturia: Secondary | ICD-10-CM | POA: Diagnosis not present

## 2019-10-05 ENCOUNTER — Ambulatory Visit
Admission: RE | Admit: 2019-10-05 | Discharge: 2019-10-05 | Disposition: A | Discharge status: Home / Self Care | Payer: Medicare PPO | Source: Ambulatory Visit | Attending: Radiation Oncology | Admitting: Radiation Oncology

## 2019-10-05 ENCOUNTER — Other Ambulatory Visit: Payer: Self-pay

## 2019-10-05 DIAGNOSIS — C61 Malignant neoplasm of prostate: Secondary | ICD-10-CM | POA: Diagnosis not present

## 2019-10-05 DIAGNOSIS — R351 Nocturia: Secondary | ICD-10-CM | POA: Diagnosis not present

## 2019-10-06 ENCOUNTER — Other Ambulatory Visit: Payer: Self-pay

## 2019-10-06 ENCOUNTER — Ambulatory Visit
Admission: RE | Admit: 2019-10-06 | Discharge: 2019-10-06 | Disposition: A | Discharge status: Home / Self Care | Payer: Medicare PPO | Source: Ambulatory Visit | Attending: Radiation Oncology | Admitting: Radiation Oncology

## 2019-10-06 DIAGNOSIS — C61 Malignant neoplasm of prostate: Secondary | ICD-10-CM | POA: Diagnosis not present

## 2019-10-06 DIAGNOSIS — R351 Nocturia: Secondary | ICD-10-CM | POA: Diagnosis not present

## 2019-10-07 ENCOUNTER — Other Ambulatory Visit: Payer: Self-pay

## 2019-10-07 ENCOUNTER — Ambulatory Visit
Admission: RE | Admit: 2019-10-07 | Discharge: 2019-10-07 | Disposition: A | Discharge status: Home / Self Care | Payer: Medicare PPO | Source: Ambulatory Visit | Attending: Radiation Oncology | Admitting: Radiation Oncology

## 2019-10-07 DIAGNOSIS — C61 Malignant neoplasm of prostate: Secondary | ICD-10-CM | POA: Diagnosis not present

## 2019-10-07 DIAGNOSIS — R351 Nocturia: Secondary | ICD-10-CM | POA: Diagnosis not present

## 2019-10-08 ENCOUNTER — Ambulatory Visit
Admission: RE | Admit: 2019-10-08 | Discharge: 2019-10-08 | Disposition: A | Discharge status: Home / Self Care | Payer: Medicare PPO | Source: Ambulatory Visit | Attending: Radiation Oncology | Admitting: Radiation Oncology

## 2019-10-08 ENCOUNTER — Other Ambulatory Visit: Payer: Self-pay

## 2019-10-08 DIAGNOSIS — R351 Nocturia: Secondary | ICD-10-CM | POA: Diagnosis not present

## 2019-10-08 DIAGNOSIS — C61 Malignant neoplasm of prostate: Secondary | ICD-10-CM | POA: Diagnosis not present

## 2019-10-11 ENCOUNTER — Ambulatory Visit
Admission: RE | Admit: 2019-10-11 | Discharge: 2019-10-11 | Disposition: A | Discharge status: Home / Self Care | Payer: Medicare PPO | Source: Ambulatory Visit | Attending: Radiation Oncology | Admitting: Radiation Oncology

## 2019-10-11 ENCOUNTER — Other Ambulatory Visit: Payer: Self-pay

## 2019-10-11 DIAGNOSIS — R351 Nocturia: Secondary | ICD-10-CM | POA: Diagnosis not present

## 2019-10-11 DIAGNOSIS — C61 Malignant neoplasm of prostate: Secondary | ICD-10-CM | POA: Diagnosis not present

## 2019-10-12 ENCOUNTER — Other Ambulatory Visit: Payer: Self-pay

## 2019-10-12 ENCOUNTER — Ambulatory Visit
Admission: RE | Admit: 2019-10-12 | Discharge: 2019-10-12 | Disposition: A | Discharge status: Home / Self Care | Payer: Medicare PPO | Source: Ambulatory Visit | Attending: Radiation Oncology | Admitting: Radiation Oncology

## 2019-10-12 DIAGNOSIS — C61 Malignant neoplasm of prostate: Secondary | ICD-10-CM | POA: Diagnosis not present

## 2019-10-12 DIAGNOSIS — R351 Nocturia: Secondary | ICD-10-CM | POA: Diagnosis not present

## 2019-10-13 ENCOUNTER — Ambulatory Visit
Admission: RE | Admit: 2019-10-13 | Discharge: 2019-10-13 | Disposition: A | Discharge status: Home / Self Care | Payer: Medicare PPO | Source: Ambulatory Visit | Attending: Radiation Oncology | Admitting: Radiation Oncology

## 2019-10-13 ENCOUNTER — Other Ambulatory Visit: Payer: Self-pay

## 2019-10-13 DIAGNOSIS — C61 Malignant neoplasm of prostate: Secondary | ICD-10-CM | POA: Diagnosis not present

## 2019-10-13 DIAGNOSIS — R351 Nocturia: Secondary | ICD-10-CM | POA: Diagnosis not present

## 2019-10-14 ENCOUNTER — Other Ambulatory Visit: Payer: Self-pay

## 2019-10-14 ENCOUNTER — Ambulatory Visit
Admission: RE | Admit: 2019-10-14 | Discharge: 2019-10-14 | Disposition: A | Discharge status: Home / Self Care | Payer: Medicare PPO | Source: Ambulatory Visit | Attending: Radiation Oncology | Admitting: Radiation Oncology

## 2019-10-14 DIAGNOSIS — R351 Nocturia: Secondary | ICD-10-CM | POA: Diagnosis not present

## 2019-10-14 DIAGNOSIS — C61 Malignant neoplasm of prostate: Secondary | ICD-10-CM | POA: Diagnosis not present

## 2019-10-15 ENCOUNTER — Other Ambulatory Visit: Payer: Self-pay

## 2019-10-15 ENCOUNTER — Ambulatory Visit
Admission: RE | Admit: 2019-10-15 | Discharge: 2019-10-15 | Disposition: A | Discharge status: Home / Self Care | Payer: Medicare PPO | Source: Ambulatory Visit | Attending: Radiation Oncology | Admitting: Radiation Oncology

## 2019-10-15 DIAGNOSIS — R351 Nocturia: Secondary | ICD-10-CM | POA: Diagnosis not present

## 2019-10-15 DIAGNOSIS — C61 Malignant neoplasm of prostate: Secondary | ICD-10-CM | POA: Diagnosis not present

## 2019-10-18 ENCOUNTER — Ambulatory Visit
Admission: RE | Admit: 2019-10-18 | Discharge: 2019-10-18 | Disposition: A | Discharge status: Home / Self Care | Payer: Medicare PPO | Source: Ambulatory Visit | Attending: Radiation Oncology | Admitting: Radiation Oncology

## 2019-10-18 ENCOUNTER — Other Ambulatory Visit: Payer: Self-pay

## 2019-10-18 ENCOUNTER — Encounter: Payer: Self-pay | Admitting: Urology

## 2019-10-18 ENCOUNTER — Encounter: Payer: Self-pay | Admitting: Medical Oncology

## 2019-10-18 DIAGNOSIS — C61 Malignant neoplasm of prostate: Secondary | ICD-10-CM | POA: Diagnosis not present

## 2019-10-18 DIAGNOSIS — R351 Nocturia: Secondary | ICD-10-CM | POA: Diagnosis not present

## 2019-10-22 DIAGNOSIS — C61 Malignant neoplasm of prostate: Secondary | ICD-10-CM | POA: Diagnosis not present

## 2019-10-30 ENCOUNTER — Other Ambulatory Visit: Payer: Self-pay | Admitting: Emergency Medicine

## 2019-10-30 NOTE — Telephone Encounter (Signed)
Requested medications are due for refill today?  Yes - The medication refill cannot be delegated.    Requested medications are on active medication list?  Yes  Last Refill:  06/07/2019  # 20 with no refills   Future visit scheduled?  Yes  Notes to Clinic:  This refill cannot be delegated.

## 2019-11-01 DIAGNOSIS — C61 Malignant neoplasm of prostate: Secondary | ICD-10-CM | POA: Diagnosis not present

## 2019-11-01 DIAGNOSIS — Z5111 Encounter for antineoplastic chemotherapy: Secondary | ICD-10-CM | POA: Diagnosis not present

## 2019-11-16 ENCOUNTER — Telehealth: Payer: Self-pay

## 2019-11-16 ENCOUNTER — Other Ambulatory Visit: Payer: Self-pay

## 2019-11-16 ENCOUNTER — Encounter: Payer: Self-pay | Admitting: Urology

## 2019-11-16 NOTE — Telephone Encounter (Signed)
Spoke with patient and advised 1 month follow up telephone appointment with [name] [name] on 11/18/19 at 1:30. Pt verbalized understanding of appointment date and time. TM

## 2019-11-16 NOTE — Progress Notes (Signed)
Spoke with patient via telephone to review meaningful use and prostate symptom questions. Pt states that the next appointment with [facility] is on August 22nd. Pt states nocturia 2-3 times per night. Pt denies having dysuria. Pt states having constipation. Pt states that urine stream is moderate. Pt states that is is not emptying his bladder completely and returning to the bathroom 20 minutes later. Pt denies having urgency or hesitancy. Pt states leakage prior to urination.

## 2019-11-17 NOTE — Progress Notes (Signed)
Radiation Oncology         [phone] ________________________________  Name: [name] [name] MRN: [id number]  Date: 11/18/2019  DOB: 1951-04-16  Post Treatment Note  CC: [name] Pollen, MD  [name] Mallow, MD  Diagnosis:   69 y.o. gentleman with Stage T1c adenocarcinoma of the prostate with Gleason score of 4+3, and PSA of 14.2  Interval Since Last Radiation:  4.5 weeks, concurrent with ST-ADT (ADT started on 07/01/2019) 09/09/19 - 10/18/19: The prostate was treated to 70 Gy in 28 fractions of 2.5 Gy  Narrative:  I spoke with the patient to conduct his routine scheduled 1 month follow up visit via telephone to spare the patient unnecessary potential exposure in the healthcare setting during the current COVID-19 pandemic.  The patient was notified in advance and gave permission to proceed with this visit format. [name] Kitchen   He tolerated radiation treatment relatively well with only minor urinary irritation and modest fatigue.  He experienced mild dysuria, weakened flow of stream, intermittency, hesitancy, urgency, frequency and nocturia x2, all of which improved with Flomax which was started during his course of treatment.  He denied any bowel issues with constipation or diarrhea.                               On review of systems, the patient states that he is doing well in general.  He continues with increased frequency, weak stream and nocturia but specifically denies dysuria, gross hematuria, straining to void, incomplete emptying or incontinence.  He has had some intermittent constipation but feels that this is improving as well.  He has continued taking Flomax and finasteride daily as prescribed.  He continues with fatigue and hot flashes, associated with his ADT and reports that he had a recent Eligard injection in May 2021.  Overall, he is quite pleased with his progress to date.  ALLERGIES:  is allergic to aspirin; statins; and gadolinium derivatives.  Meds: Current Outpatient  Medications  Medication Sig Dispense Refill  . ALPRAZolam (XANAX) 1 MG tablet TAKE HALF TABLET BY MOUTH EVERY DAY AT BEDTIME AS NEEDED 15 tablet 1  . finasteride (PROPECIA) 1 MG tablet Take 1 mg by mouth daily.    [name] Kitchen lisinopril-hydrochlorothiazide (ZESTORETIC) 10-12.5 MG tablet TAKE 1 TABLET BY MOUTH EVERY DAY 90 tablet 1  . LORazepam (ATIVAN) 1 MG tablet Take 1 tablet (1 mg total) by mouth as needed for anxiety (30 minutes prior to MRI scan and may repeat once, just prior to scan if needed.). 2 tablet 0  . rosuvastatin (CRESTOR) 10 MG tablet TAKE 1 TABLET BY MOUTH EVERY DAY 90 tablet 3  . tamsulosin (FLOMAX) 0.4 MG CAPS capsule Take 1 capsule (0.4 mg total) by mouth daily after supper. 30 capsule 5  . triamcinolone (NASACORT) 55 MCG/ACT AERO nasal inhaler Place 2 sprays into the nose daily. 1 Inhaler 12  . methocarbamol (ROBAXIN) 500 MG tablet Take 1 tablet (500 mg total) 2 (two) times daily by mouth. (Patient not taking: Reported on 07/12/2019) 20 tablet 0   No current facility-administered medications for this encounter.    Physical Findings:  vitals were not taken for this visit.   /Unable to assess due to telephone follow-up visit format.  Lab Findings: Lab Results  Component Value Date   WBC 6.8 07/12/2019   HGB 13.7 07/12/2019   HCT 42.5 07/12/2019   MCV 84 07/12/2019   PLT 202 07/12/2019  Radiographic Findings: No results found.  Impression/Plan: 1. 69 y.o. gentleman with Stage T1c adenocarcinoma of the prostate with Gleason score of 4+3, and PSA of 14.2 He will continue to follow up with urology for ongoing PSA determinations and has an appointment scheduled with Dr. Gloriann Loan on 01/30/2020 for repeat labs and his next 25-month Eligard ADT injection. He understands what to expect with regards to PSA monitoring going forward. I will look forward to following his response to treatment via correspondence with urology, and would be happy to continue to participate in his care if  clinically indicated. I talked to the patient about what to expect in the future, including his risk for erectile dysfunction and rectal bleeding. I encouraged him to call or return to the office if he has any questions regarding his previous radiation or possible radiation side effects. He was comfortable with this plan and will follow up as needed.  Today, a comprehensive survivorship care plan and treatment summary was reviewed with the patient today detailing his prostate cancer diagnosis, treatment course, potential late/long-term effects of treatment, appropriate follow-up care with recommendations for the future, and patient education resources.  A copy of this summary, along with a letter will be sent to the patient's primary care provider via fax after today's visit.   2. Cancer screening:  Due to Mr. Adachi history and his age, he should receive screening for skin cancers and colon cancer.  The information and recommendations are listed on the patient's comprehensive care plan/treatment summary and were reviewed in detail with the patient.     3. Health maintenance and wellness promotion: Mr. Jilek was encouraged to consume 5-7 servings of fruits and vegetables per day. He was provided a copy of the "Nutrition Rainbow" handout, as well as the handout "Take Control of Your Health and [place]" from the [facility].  He was also encouraged to engage in moderate to vigorous exercise for 30 minutes per day most days of the week. Information was provided regarding the [facility] fitness program, which is designed for cancer survivors to help them become more physically fit after cancer treatments. We discussed that a healthy BMI is 18.5-24.9 and that maintaining a healthy weight reduces risk of cancer recurrences.  He was instructed to limit his alcohol consumption and continue to abstain from tobacco use.  Lastly, he was encouraged to use sunscreen and wear protective  clothing when in the sun.     4. Support services/counseling: It is not uncommon for this period of the patient's cancer care trajectory to be one of many emotions and stressors.  Mr. Mella was encouraged to take advantage of our many support services programs, support groups, and/or counseling in coping with his new life as a cancer survivor after completing anti-cancer treatment.  He was offered support today through active listening and expressive supportive counseling.  He was given information regarding our available services and encouraged to contact me with any questions or for help enrolling in any of our support group/programs.      Nicholos Johns, PA-C

## 2019-11-18 ENCOUNTER — Ambulatory Visit
Admission: RE | Admit: 2019-11-18 | Discharge: 2019-11-18 | Disposition: A | Discharge status: Home / Self Care | Payer: Medicare PPO | Source: Ambulatory Visit | Attending: Urology | Admitting: Urology

## 2019-11-18 ENCOUNTER — Other Ambulatory Visit: Payer: Self-pay

## 2019-11-18 DIAGNOSIS — C61 Malignant neoplasm of prostate: Secondary | ICD-10-CM

## 2020-01-10 ENCOUNTER — Encounter: Payer: Self-pay | Admitting: Emergency Medicine

## 2020-01-10 ENCOUNTER — Other Ambulatory Visit: Payer: Self-pay

## 2020-01-10 ENCOUNTER — Ambulatory Visit: Payer: Medicare PPO | Admitting: Emergency Medicine

## 2020-01-10 ENCOUNTER — Telehealth: Payer: Self-pay | Admitting: Emergency Medicine

## 2020-01-10 VITALS — BP 129/82 | HR 68 | Temp 97.7°F | Ht 74.0 in | Wt 296.2 lb

## 2020-01-10 DIAGNOSIS — Z6838 Body mass index (BMI) 38.0-38.9, adult: Secondary | ICD-10-CM | POA: Insufficient documentation

## 2020-01-10 DIAGNOSIS — R7303 Prediabetes: Secondary | ICD-10-CM | POA: Diagnosis not present

## 2020-01-10 DIAGNOSIS — C61 Malignant neoplasm of prostate: Secondary | ICD-10-CM | POA: Diagnosis not present

## 2020-01-10 DIAGNOSIS — I10 Essential (primary) hypertension: Secondary | ICD-10-CM | POA: Diagnosis not present

## 2020-01-10 DIAGNOSIS — E785 Hyperlipidemia, unspecified: Secondary | ICD-10-CM

## 2020-01-10 DIAGNOSIS — K5790 Diverticulosis of intestine, part unspecified, without perforation or abscess without bleeding: Secondary | ICD-10-CM

## 2020-01-10 MED ORDER — LISINOPRIL-HYDROCHLOROTHIAZIDE 10-12.5 MG PO TABS: 1.0000 | ORAL_TABLET | Freq: Every day | ORAL | 3 refills | Status: DC

## 2020-01-10 NOTE — Patient Instructions (Addendum)
   If you have lab work done today you will be contacted with your lab results within the next 2 weeks.  If you have not heard from us then please contact us. The fastest way to get your results is to register for My Chart.   IF you received an x-ray today, you will receive an invoice from [facility]. Please contact [facility] at [phone] with questions or concerns regarding your invoice.   IF you received labwork today, you will receive an invoice from [facility]. Please contact [facility] at [phone] with questions or concerns regarding your invoice.   Our billing staff will not be able to assist you with questions regarding bills from these companies.  You will be contacted with the lab results as soon as they are available. The fastest way to get your results is to activate your My Chart account. Instructions are located on the last page of this paperwork. If you have not heard from us regarding the results in 2 weeks, please contact this office.      Prediabetes Eating Plan Prediabetes is a condition that causes blood sugar (glucose) levels to be higher than normal. This increases the risk for developing diabetes. In order to prevent diabetes from developing, your health care provider may recommend a diet and other lifestyle changes to help you:  Control your blood glucose levels.  Improve your cholesterol levels.  Manage your blood pressure. Your health care provider may recommend working with a diet and nutrition specialist (dietitian) to make a meal plan that is best for you. What are tips for following this plan? Lifestyle  Set weight loss goals with the help of your health care team. It is recommended that most people with prediabetes lose 7% of their current body weight.  Exercise for at least 30 minutes at least 5 days a week.  Attend a support group or seek ongoing support from a mental health counselor.  Take over-the-counter and prescription  medicines only as told by your health care provider. Reading food labels  Read food labels to check the amount of fat, salt (sodium), and sugar in prepackaged foods. Avoid foods that have: ? Saturated fats. ? Trans fats. ? Added sugars.  Avoid foods that have more than 300 milligrams (mg) of sodium per serving. Limit your daily sodium intake to less than 2,300 mg each day. Shopping  Avoid buying pre-made and processed foods. Cooking  Cook with olive oil. Do not use butter, lard, or ghee.  Bake, broil, grill, or boil foods. Avoid frying. Meal planning   Work with your dietitian to develop an eating plan that is right for you. This may include: ? Tracking how many calories you take in. Use a food diary, notebook, or mobile application to track what you eat at each meal. ? Using the glycemic index (GI) to plan your meals. The index tells you how quickly a food will raise your blood glucose. Choose low-GI foods. These foods take a longer time to raise blood glucose.  Consider following a Mediterranean diet. This diet includes: ? Several servings each day of fresh fruits and vegetables. ? Eating fish at least twice a week. ? Several servings each day of whole grains, beans, nuts, and seeds. ? Using olive oil instead of other fats. ? Moderate alcohol consumption. ? Eating small amounts of red meat and whole-fat dairy.  If you have high blood pressure, you may need to limit your sodium intake or follow a diet such as   the DASH eating plan. DASH is an eating plan that aims to lower high blood pressure. What foods are recommended? The items listed below may not be a complete list. Talk with your dietitian about what dietary choices are best for you. Grains Whole grains, such as whole-wheat or whole-grain breads, crackers, cereals, and pasta. Unsweetened oatmeal. Bulgur. Barley. Quinoa. Brown rice. Corn or whole-wheat flour tortillas or taco shells. Vegetables Lettuce. Spinach. Peas.  Beets. Cauliflower. Cabbage. Broccoli. Carrots. Tomatoes. Squash. Eggplant. Herbs. Peppers. Onions. Cucumbers. Brussels sprouts. Fruits Berries. Bananas. Apples. Oranges. Grapes. Papaya. Mango. Pomegranate. Kiwi. Grapefruit. Cherries. Meats and other protein foods Seafood. Poultry without skin. Lean cuts of pork and beef. Tofu. Eggs. Nuts. Beans. Dairy Low-fat or fat-free dairy products, such as yogurt, cottage cheese, and cheese. Beverages Water. Tea. Coffee. Sugar-free or diet soda. Seltzer water. Lowfat or no-fat milk. Milk alternatives, such as soy or almond milk. Fats and oils Olive oil. Canola oil. Sunflower oil. Grapeseed oil. Avocado. Walnuts. Sweets and desserts Sugar-free or low-fat pudding. Sugar-free or low-fat ice cream and other frozen treats. Seasoning and other foods Herbs. Sodium-free spices. Mustard. Relish. Low-fat, low-sugar ketchup. Low-fat, low-sugar barbecue sauce. Low-fat or fat-free mayonnaise. What foods are not recommended? The items listed below may not be a complete list. Talk with your dietitian about what dietary choices are best for you. Grains Refined white flour and flour products, such as bread, pasta, snack foods, and cereals. Vegetables Canned vegetables. Frozen vegetables with butter or cream sauce. Fruits Fruits canned with syrup. Meats and other protein foods Fatty cuts of meat. Poultry with skin. Breaded or fried meat. Processed meats. Dairy Full-fat yogurt, cheese, or milk. Beverages Sweetened drinks, such as sweet iced tea and soda. Fats and oils Butter. Lard. Ghee. Sweets and desserts Baked goods, such as cake, cupcakes, pastries, cookies, and cheesecake. Seasoning and other foods Spice mixes with added salt. Ketchup. Barbecue sauce. Mayonnaise. Summary  To prevent diabetes from developing, you may need to make diet and other lifestyle changes to help control blood sugar, improve cholesterol levels, and manage your blood  pressure.  Set weight loss goals with the help of your health care team. It is recommended that most people with prediabetes lose 7 percent of their current body weight.  Consider following a Mediterranean diet that includes plenty of fresh fruits and vegetables, whole grains, beans, nuts, seeds, fish, lean meat, low-fat dairy, and healthy oils. This information is not intended to replace advice given to you by your health care provider. Make sure you discuss any questions you have with your health care provider. Document Revised: 09/18/2018 Document Reviewed: 07/31/2016 Elsevier Patient Education  2020 Elsevier Inc.  Hypertension, Adult High blood pressure (hypertension) is when the force of blood pumping through the arteries is too strong. The arteries are the blood vessels that carry blood from the heart throughout the body. Hypertension forces the heart to work harder to pump blood and may cause arteries to become narrow or stiff. Untreated or uncontrolled hypertension can cause a heart attack, heart failure, a stroke, kidney disease, and other problems. A blood pressure reading consists of a higher number over a lower number. Ideally, your blood pressure should be below 120/80. The first ("top") number is called the systolic pressure. It is a measure of the pressure in your arteries as your heart beats. The second ("bottom") number is called the diastolic pressure. It is a measure of the pressure in your arteries as the heart relaxes. What are the causes? The   exact cause of this condition is not known. There are some conditions that result in or are related to high blood pressure. What increases the risk? Some risk factors for high blood pressure are under your control. The following factors may make you more likely to develop this condition:  Smoking.  Having type 2 diabetes mellitus, high cholesterol, or both.  Not getting enough exercise or physical activity.  Being  overweight.  Having too much fat, sugar, calories, or salt (sodium) in your diet.  Drinking too much alcohol. Some risk factors for high blood pressure may be difficult or impossible to change. Some of these factors include:  Having chronic kidney disease.  Having a family history of high blood pressure.  Age. Risk increases with age.  Race. You may be at higher risk if you are African American.  Gender. Men are at higher risk than women before age 45. After age 65, women are at higher risk than men.  Having obstructive sleep apnea.  Stress. What are the signs or symptoms? High blood pressure may not cause symptoms. Very high blood pressure (hypertensive crisis) may cause:  Headache.  Anxiety.  Shortness of breath.  Nosebleed.  Nausea and vomiting.  Vision changes.  Severe chest pain.  Seizures. How is this diagnosed? This condition is diagnosed by measuring your blood pressure while you are seated, with your arm resting on a flat surface, your legs uncrossed, and your feet flat on the floor. The cuff of the blood pressure monitor will be placed directly against the skin of your upper arm at the level of your heart. It should be measured at least twice using the same arm. Certain conditions can cause a difference in blood pressure between your right and left arms. Certain factors can cause blood pressure readings to be lower or higher than normal for a short period of time:  When your blood pressure is higher when you are in a health care provider's office than when you are at home, this is called white coat hypertension. Most people with this condition do not need medicines.  When your blood pressure is higher at home than when you are in a health care provider's office, this is called masked hypertension. Most people with this condition may need medicines to control blood pressure. If you have a high blood pressure reading during one visit or you have normal blood  pressure with other risk factors, you may be asked to:  Return on a different day to have your blood pressure checked again.  Monitor your blood pressure at home for 1 week or longer. If you are diagnosed with hypertension, you may have other blood or imaging tests to help your health care provider understand your overall risk for other conditions. How is this treated? This condition is treated by making healthy lifestyle changes, such as eating healthy foods, exercising more, and reducing your alcohol intake. Your health care provider may prescribe medicine if lifestyle changes are not enough to get your blood pressure under control, and if:  Your systolic blood pressure is above 130.  Your diastolic blood pressure is above 80. Your personal target blood pressure may vary depending on your medical conditions, your age, and other factors. Follow these instructions at home: Eating and drinking   Eat a diet that is high in fiber and potassium, and low in sodium, added sugar, and fat. An example eating plan is called the DASH (Dietary Approaches to Stop Hypertension) diet. To eat this way: ?   Eat plenty of fresh fruits and vegetables. Try to fill one half of your plate at each meal with fruits and vegetables. ? Eat whole grains, such as whole-wheat pasta, brown rice, or whole-grain bread. Fill about one fourth of your plate with whole grains. ? Eat or drink low-fat dairy products, such as skim milk or low-fat yogurt. ? Avoid fatty cuts of meat, processed or cured meats, and poultry with skin. Fill about one fourth of your plate with lean proteins, such as fish, chicken without skin, beans, eggs, or tofu. ? Avoid pre-made and processed foods. These tend to be higher in sodium, added sugar, and fat.  Reduce your daily sodium intake. Most people with hypertension should eat less than 1,500 mg of sodium a day.  Do not drink alcohol if: ? Your health care provider tells you not to drink. ? You are  pregnant, may be pregnant, or are planning to become pregnant.  If you drink alcohol: ? Limit how much you use to:  0-1 drink a day for women.  0-2 drinks a day for men. ? Be aware of how much alcohol is in your drink. In the [place] one drink equals one 12 oz bottle of beer (355 mL), one 5 oz glass of wine (148 mL), or one 1 oz glass of hard liquor (44 mL). Lifestyle   Work with your health care provider to maintain a healthy body weight or to lose weight. Ask what an ideal weight is for you.  Get at least 30 minutes of exercise most days of the week. Activities may include walking, swimming, or biking.  Include exercise to strengthen your muscles (resistance exercise), such as Pilates or lifting weights, as part of your weekly exercise routine. Try to do these types of exercises for 30 minutes at least 3 days a week.  Do not use any products that contain nicotine or tobacco, such as cigarettes, e-cigarettes, and chewing tobacco. If you need help quitting, ask your health care provider.  Monitor your blood pressure at home as told by your health care provider.  Keep all follow-up visits as told by your health care provider. This is important. Medicines  Take over-the-counter and prescription medicines only as told by your health care provider. Follow directions carefully. Blood pressure medicines must be taken as prescribed.  Do not skip doses of blood pressure medicine. Doing this puts you at risk for problems and can make the medicine less effective.  Ask your health care provider about side effects or reactions to medicines that you should watch for. Contact a health care provider if you:  Think you are having a reaction to a medicine you are taking.  Have headaches that keep coming back (recurring).  Feel dizzy.  Have swelling in your ankles.  Have trouble with your vision. Get help right away if you:  Develop a severe headache or confusion.  Have unusual weakness or  numbness.  Feel faint.  Have severe pain in your chest or abdomen.  Vomit repeatedly.  Have trouble breathing. Summary  Hypertension is when the force of blood pumping through your arteries is too strong. If this condition is not controlled, it may put you at risk for serious complications.  Your personal target blood pressure may vary depending on your medical conditions, your age, and other factors. For most people, a normal blood pressure is less than 120/80.  Hypertension is treated with lifestyle changes, medicines, or a combination of both. Lifestyle changes include losing weight, eating   a healthy, low-sodium diet, exercising more, and limiting alcohol. This information is not intended to replace advice given to you by your health care provider. Make sure you discuss any questions you have with your health care provider. Document Revised: 02/04/2018 Document Reviewed: 02/04/2018 Elsevier Patient Education  2020 Elsevier Inc.  

## 2020-01-10 NOTE — Telephone Encounter (Signed)
Called and spoke with the patient he stated that he received the [employer]. And inserted it into the chart.

## 2020-01-10 NOTE — Assessment & Plan Note (Signed)
Fasting labs done today.  Continue Crestor 10 mg daily.

## 2020-01-10 NOTE — Progress Notes (Signed)
[name] [name] 69 y.o.   Chief Complaint  Patient presents with  . Medical Management of Chronic Issues    6 m f/u (Covid vaccine receieved will call us with dates)  . Hypertension    HISTORY OF PRESENT ILLNESS: This is a 69 y.o. male with history of hypertension, dyslipidemia, prediabetes, and prostate cancer here for follow-up. Doing well.  Has no complaints or medical concerns today.  Taking tamsulosin and finasteride. Just finished radiation therapy for prostate cancer. Taking Zestoretic 10-12.5 mg daily. BP Readings from Last 3 Encounters:  01/10/20 (!) 129/82  07/12/19 131/76  06/07/19 129/73  History of borderline diabetes.  No medications. Lab Results  Component Value Date   HGBA1C 6.5 (H) 07/12/2019  History of dyslipidemia.  On Crestor 10 mg daily. Lab Results  Component Value Date   CHOL 137 07/12/2019   HDL 41 07/12/2019   LDLCALC 68 07/12/2019   TRIG 165 (H) 07/12/2019   CHOLHDL 3.3 07/12/2019  Fully vaccinated against Covid.     HPI   Prior to Admission medications   Medication Sig Start Date End Date Taking? Authorizing Provider  ALPRAZolam Duanne Moron) 1 MG tablet TAKE HALF TABLET BY MOUTH EVERY DAY AT BEDTIME AS NEEDED 11/01/19  Yes Jeremaih Klima, Ines Bloomer, MD  B Complex Vitamins (VITAMIN B COMPLEX) TABS Take by mouth.   Yes [provider]  finasteride (PROPECIA) 1 MG tablet Take 1 mg by mouth daily.   Yes [provider]  lisinopril-hydrochlorothiazide (ZESTORETIC) 10-12.5 MG tablet TAKE 1 TABLET BY MOUTH EVERY DAY 09/15/19  Yes Cesario Weidinger, Ines Bloomer, MD  rosuvastatin (CRESTOR) 10 MG tablet TAKE 1 TABLET BY MOUTH EVERY DAY 09/19/19  Yes Isador Castille, Ines Bloomer, MD  tamsulosin (FLOMAX) 0.4 MG CAPS capsule Take 1 capsule (0.4 mg total) by mouth daily after supper. 09/24/19  Yes Tyler Pita, MD  triamcinolone (NASACORT) 55 MCG/ACT AERO nasal inhaler Place 2 sprays into the nose daily. 06/07/19  Yes Horald Pollen, MD    Allergies    Allergen Reactions  . Aspirin     REACTION: upsets stomach  . Statins     REACTION: UPSETS STOMACH  . Gadolinium Derivatives Nausea And Vomiting    Pt was given 20 ml multihance and became nauseated and vomited several times.     Patient Active Problem List   Diagnosis Date Noted  . Malignant neoplasm of prostate ([place]) 06/08/2019  . Prostate enlargement 08/29/2017  . Gout 10/25/2014  . Hypertension   . BPH (benign prostatic hypertrophy)   . Kidney stones   . Prostate nodule     Past Medical History:  Diagnosis Date  . Allergy   . Anxiety   . Arthritis   . BPH (benign prostatic hypertrophy)   . Colon polyp    diverticular bleed with transfusion  . Diverticulosis   . Hyperlipidemia   . Hypertension   . Kidney stones    15-20  . Obesity, unspecified   . Prostate cancer ([place])   . Prostate nodule [id number]    Past Surgical History:  Procedure Laterality Date  . ANKLE FUSION    . HERNIA REPAIR    . JOINT REPLACEMENT    . TRANSURETHRAL RESECTION OF PROSTATE      Social History   Socioeconomic History  . Marital status: Married    Spouse name: Not on file  . Number of children: 2  . Years of education: Not on file  . Highest education level: Not on file  Occupational History  .  Not on file  Tobacco Use  . Smoking status: Never Smoker  . Smokeless tobacco: Never Used  Vaping Use  . Vaping Use: Never used  Substance and Sexual Activity  . Alcohol use: No  . Drug use: No  . Sexual activity: Yes  Other Topics Concern  . Not on file  Social History Narrative  . Not on file   Social Determinants of Health   Financial Resource Strain:   . Difficulty of Paying Living Expenses:   Food Insecurity:   . Worried About Charity fundraiser in the Last Year:   . Arboriculturist in the Last Year:   Transportation Needs:   . Film/video editor (Medical):   [name] Kitchen Lack of Transportation (Non-Medical):   Physical Activity:   . Days of Exercise per Week:   .  Minutes of Exercise per Session:   Stress:   . Feeling of Stress :   Social Connections:   . Frequency of Communication with Friends and Family:   . Frequency of Social Gatherings with Friends and Family:   . Attends Religious Services:   . Active Member of Clubs or Organizations:   . Attends Archivist Meetings:   [name] Kitchen Marital Status:   Intimate Partner Violence:   . Fear of Current or Ex-Partner:   . Emotionally Abused:   [name] Kitchen Physically Abused:   . Sexually Abused:     Family History  Problem Relation Age of Onset  . Diabetes Mother   . Breast cancer Mother   . Prostate cancer Father        patient did not have a relationship with his father but understands he had prostate ca  . Colon cancer Neg Hx   . Pancreatic cancer Neg Hx      Review of Systems  Constitutional: Negative for chills and fever.  HENT: Negative.  Negative for congestion and sore throat.   Respiratory: Negative.  Negative for cough and shortness of breath.   Cardiovascular: Negative.  Negative for chest pain and palpitations.  Gastrointestinal: Negative.  Negative for abdominal pain, blood in stool, diarrhea, melena, nausea and vomiting.  Genitourinary: Negative.  Negative for dysuria and hematuria.  Musculoskeletal: Negative.  Negative for back pain, myalgias and neck pain.  Skin: Negative.  Negative for rash.  Neurological: Negative.  Negative for dizziness and headaches.  All other systems reviewed and are negative.  Today's Vitals   01/10/20 0843  BP: (!) 129/82  Pulse: 68  Temp: 97.7 F (36.5 C)  TempSrc: Temporal  SpO2: 99%  Weight: (!) 296 lb 3.2 oz (134.4 kg)  Height: 6\' 2"  (1.88 m)   Body mass index is 38.03 kg/m. Wt Readings from Last 3 Encounters:  01/10/20 (!) 296 lb 3.2 oz (134.4 kg)  07/12/19 (!) 309 lb (140.2 kg)  06/08/19 (!) 310 lb (140.6 kg)     Physical Exam Vitals reviewed.  Constitutional:      Appearance: He is obese.  HENT:     Head: Normocephalic.  Eyes:      Extraocular Movements: Extraocular movements intact.     Conjunctiva/sclera: Conjunctivae normal.     Pupils: Pupils are equal, round, and reactive to light.  Cardiovascular:     Rate and Rhythm: Normal rate and regular rhythm.     Pulses: Normal pulses.     Heart sounds: Normal heart sounds.  Pulmonary:     Effort: Pulmonary effort is normal.     Breath sounds: Normal breath  sounds.  Abdominal:     General: Bowel sounds are normal. There is no distension.     Palpations: Abdomen is soft.     Tenderness: There is no abdominal tenderness.  Musculoskeletal:        General: Normal range of motion.     Cervical back: Normal range of motion and neck supple.     Right lower leg: No edema.     Left lower leg: No edema.  Skin:    General: Skin is warm and dry.     Capillary Refill: Capillary refill takes less than 2 seconds.  Neurological:     General: No focal deficit present.     Mental Status: He is alert and oriented to person, place, and time.  Psychiatric:        Mood and Affect: Mood normal.        Behavior: Behavior normal.    A total of 30 minutes was spent with the patient, greater than 50% of which was in counseling/coordination of care regarding multiple chronic medical problems, treatment and management, review of all medications, review of most recent blood work results, review of most recent office visit notes, diet and nutrition, cardiovascular risks associated with these chronic medical conditions, prognosis and need for follow-up.   ASSESSMENT & PLAN: Hypertension Well-controlled hypertension.  Continue present medications.  No changes.  Prediabetes Borderline diabetes.  On no medications.  Continue diet and nutrition along with increased physical activity.  Dyslipidemia Fasting labs done today.  Continue Crestor 10 mg daily.  [name] [name] was seen today for medical management of chronic issues and hypertension.  Diagnoses and all orders for this  visit:  Essential hypertension -     Comprehensive metabolic panel -     lisinopril-hydrochlorothiazide (ZESTORETIC) 10-12.5 MG tablet; Take 1 tablet by mouth daily.  Malignant neoplasm of prostate ([facility])  Dyslipidemia -     Lipid panel  Prediabetes -     Hemoglobin A1c  Diverticulosis  Body mass index (BMI) of 38.0-38.9 in adult    Patient Instructions       If you have lab work done today you will be contacted with your lab results within the next 2 weeks.  If you have not heard from [place] then please contact us. The fastest way to get your results is to register for My Chart.   IF you received an x-ray today, you will receive an invoice from [facility]. Please contact [facility] at [phone] with questions or concerns regarding your invoice.   IF you received labwork today, you will receive an invoice from [facility]. Please contact [facility] at [phone] with questions or concerns regarding your invoice.   Our billing staff will not be able to assist you with questions regarding bills from these companies.  You will be contacted with the lab results as soon as they are available. The fastest way to get your results is to activate your My Chart account. Instructions are located on the last page of this paperwork. If you have not heard from [place] regarding the results in 2 weeks, please contact this office.      Prediabetes Eating Plan Prediabetes is a condition that causes blood sugar (glucose) levels to be higher than normal. This increases the risk for developing diabetes. In order to prevent diabetes from developing, your health care provider may recommend a diet and other lifestyle changes to help you:  Control your blood glucose levels.  Improve your cholesterol levels.  Manage your  blood pressure. Your health care provider may recommend working with a diet and nutrition specialist (dietitian) to make a meal plan that is best for you. What are  tips for following this plan? Lifestyle  Set weight loss goals with the help of your health care team. It is recommended that most people with prediabetes lose 7% of their current body weight.  Exercise for at least 30 minutes at least 5 days a week.  Attend a support group or seek ongoing support from a mental health counselor.  Take over-the-counter and prescription medicines only as told by your health care provider. Reading food labels  Read food labels to check the amount of fat, salt (sodium), and sugar in prepackaged foods. Avoid foods that have: ? Saturated fats. ? Trans fats. ? Added sugars.  Avoid foods that have more than 300 milligrams (mg) of sodium per serving. Limit your daily sodium intake to less than 2,300 mg each day. Shopping  Avoid buying pre-made and processed foods. Cooking  Cook with olive oil. Do not use butter, lard, or ghee.  Bake, broil, grill, or boil foods. Avoid frying. Meal planning   Work with your dietitian to develop an eating plan that is right for you. This may include: ? Tracking how many calories you take in. Use a food diary, notebook, or mobile application to track what you eat at each meal. ? Using the glycemic index (GI) to plan your meals. The index tells you how quickly a food will raise your blood glucose. Choose low-GI foods. These foods take a longer time to raise blood glucose.  Consider following a Mediterranean diet. This diet includes: ? Several servings each day of fresh fruits and vegetables. ? Eating fish at least twice a week. ? Several servings each day of whole grains, beans, nuts, and seeds. ? Using olive oil instead of other fats. ? Moderate alcohol consumption. ? Eating small amounts of red meat and whole-fat dairy.  If you have high blood pressure, you may need to limit your sodium intake or follow a diet such as the DASH eating plan. DASH is an eating plan that aims to lower high blood pressure. What foods are  recommended? The items listed below may not be a complete list. Talk with your dietitian about what dietary choices are best for you. Grains Whole grains, such as whole-wheat or whole-grain breads, crackers, cereals, and pasta. Unsweetened oatmeal. Bulgur. Barley. Quinoa. Brown rice. Corn or whole-wheat flour tortillas or taco shells. Vegetables Lettuce. Spinach. Peas. Beets. Cauliflower. Cabbage. Broccoli. Carrots. Tomatoes. Squash. Eggplant. Herbs. Peppers. Onions. Cucumbers. Brussels sprouts. Fruits Berries. Bananas. Apples. Oranges. Grapes. Papaya. Mango. Pomegranate. Kiwi. Grapefruit. Cherries. Meats and other protein foods Seafood. Poultry without skin. Lean cuts of pork and beef. Tofu. Eggs. Nuts. Beans. Dairy Low-fat or fat-free dairy products, such as yogurt, cottage cheese, and cheese. Beverages Water. Tea. Coffee. Sugar-free or diet soda. Seltzer water. Lowfat or no-fat milk. Milk alternatives, such as soy or almond milk. Fats and oils Olive oil. Canola oil. Sunflower oil. Grapeseed oil. Avocado. Walnuts. Sweets and desserts Sugar-free or low-fat pudding. Sugar-free or low-fat ice cream and other frozen treats. Seasoning and other foods Herbs. Sodium-free spices. Mustard. Relish. Low-fat, low-sugar ketchup. Low-fat, low-sugar barbecue sauce. Low-fat or fat-free mayonnaise. What foods are not recommended? The items listed below may not be a complete list. Talk with your dietitian about what dietary choices are best for you. Grains Refined white flour and flour products, such as bread, pasta, snack foods,  and cereals. Vegetables Canned vegetables. Frozen vegetables with butter or cream sauce. Fruits Fruits canned with syrup. Meats and other protein foods Fatty cuts of meat. Poultry with skin. Breaded or fried meat. Processed meats. Dairy Full-fat yogurt, cheese, or milk. Beverages Sweetened drinks, such as sweet iced tea and soda. Fats and oils Butter. Lard. Ghee. Sweets  and desserts Baked goods, such as cake, cupcakes, pastries, cookies, and cheesecake. Seasoning and other foods Spice mixes with added salt. Ketchup. Barbecue sauce. Mayonnaise. Summary  To prevent diabetes from developing, you may need to make diet and other lifestyle changes to help control blood sugar, improve cholesterol levels, and manage your blood pressure.  Set weight loss goals with the help of your health care team. It is recommended that most people with prediabetes lose 7 percent of their current body weight.  Consider following a Mediterranean diet that includes plenty of fresh fruits and vegetables, whole grains, beans, nuts, seeds, fish, lean meat, low-fat dairy, and healthy oils. This information is not intended to replace advice given to you by your health care provider. Make sure you discuss any questions you have with your health care provider. Document Revised: 09/18/2018 Document Reviewed: 07/31/2016 Elsevier Patient Education  [place].  Hypertension, Adult High blood pressure (hypertension) is when the force of blood pumping through the arteries is too strong. The arteries are the blood vessels that carry blood from the heart throughout the body. Hypertension forces the heart to work harder to pump blood and may cause arteries to become narrow or stiff. Untreated or uncontrolled hypertension can cause a heart attack, heart failure, a stroke, kidney disease, and other problems. A blood pressure reading consists of a higher number over a lower number. Ideally, your blood pressure should be below 120/80. The first ("top") number is called the systolic pressure. It is a measure of the pressure in your arteries as your heart beats. The second ("bottom") number is called the diastolic pressure. It is a measure of the pressure in your arteries as the heart relaxes. What are the causes? The exact cause of this condition is not known. There are some conditions that result in  or are related to high blood pressure. What increases the risk? Some risk factors for high blood pressure are under your control. The following factors may make you more likely to develop this condition:  Smoking.  Having type 2 diabetes mellitus, high cholesterol, or both.  Not getting enough exercise or physical activity.  Being overweight.  Having too much fat, sugar, calories, or salt (sodium) in your diet.  Drinking too much alcohol. Some risk factors for high blood pressure may be difficult or impossible to change. Some of these factors include:  Having chronic kidney disease.  Having a family history of high blood pressure.  Age. Risk increases with age.  Race. You may be at higher risk if you are African American.  Gender. Men are at higher risk than women before age 57. After age 70, women are at higher risk than men.  Having obstructive sleep apnea.  Stress. What are the signs or symptoms? High blood pressure may not cause symptoms. Very high blood pressure (hypertensive crisis) may cause:  Headache.  Anxiety.  Shortness of breath.  Nosebleed.  Nausea and vomiting.  Vision changes.  Severe chest pain.  Seizures. How is this diagnosed? This condition is diagnosed by measuring your blood pressure while you are seated, with your arm resting on a flat surface, your legs uncrossed,  and your feet flat on the floor. The cuff of the blood pressure monitor will be placed directly against the skin of your upper arm at the level of your heart. It should be measured at least twice using the same arm. Certain conditions can cause a difference in blood pressure between your right and left arms. Certain factors can cause blood pressure readings to be lower or higher than normal for a short period of time:  When your blood pressure is higher when you are in a health care provider's office than when you are at home, this is called white coat hypertension. Most people with  this condition do not need medicines.  When your blood pressure is higher at home than when you are in a health care provider's office, this is called masked hypertension. Most people with this condition may need medicines to control blood pressure. If you have a high blood pressure reading during one visit or you have normal blood pressure with other risk factors, you may be asked to:  Return on a different day to have your blood pressure checked again.  Monitor your blood pressure at home for 1 week or longer. If you are diagnosed with hypertension, you may have other blood or imaging tests to help your health care provider understand your overall risk for other conditions. How is this treated? This condition is treated by making healthy lifestyle changes, such as eating healthy foods, exercising more, and reducing your alcohol intake. Your health care provider may prescribe medicine if lifestyle changes are not enough to get your blood pressure under control, and if:  Your systolic blood pressure is above 130.  Your diastolic blood pressure is above 80. Your personal target blood pressure may vary depending on your medical conditions, your age, and other factors. Follow these instructions at home: Eating and drinking   Eat a diet that is high in fiber and potassium, and low in sodium, added sugar, and fat. An example eating plan is called the DASH (Dietary Approaches to Stop Hypertension) diet. To eat this way: ? Eat plenty of fresh fruits and vegetables. Try to fill one half of your plate at each meal with fruits and vegetables. ? Eat whole grains, such as whole-wheat pasta, brown rice, or whole-grain bread. Fill about one fourth of your plate with whole grains. ? Eat or drink low-fat dairy products, such as skim milk or low-fat yogurt. ? Avoid fatty cuts of meat, processed or cured meats, and poultry with skin. Fill about one fourth of your plate with lean proteins, such as fish, chicken  without skin, beans, eggs, or tofu. ? Avoid pre-made and processed foods. These tend to be higher in sodium, added sugar, and fat.  Reduce your daily sodium intake. Most people with hypertension should eat less than 1,500 mg of sodium a day.  Do not drink alcohol if: ? Your health care provider tells you not to drink. ? You are pregnant, may be pregnant, or are planning to become pregnant.  If you drink alcohol: ? Limit how much you use to:  0-1 drink a day for women.  0-2 drinks a day for men. ? Be aware of how much alcohol is in your drink. In the [place] one drink equals one 12 oz bottle of beer (355 mL), one 5 oz glass of wine (148 mL), or one 1 oz glass of hard liquor (44 mL). Lifestyle   Work with your health care provider to maintain a healthy body weight  or to lose weight. Ask what an ideal weight is for you.  Get at least 30 minutes of exercise most days of the week. Activities may include walking, swimming, or biking.  Include exercise to strengthen your muscles (resistance exercise), such as Pilates or lifting weights, as part of your weekly exercise routine. Try to do these types of exercises for 30 minutes at least 3 days a week.  Do not use any products that contain nicotine or tobacco, such as cigarettes, e-cigarettes, and chewing tobacco. If you need help quitting, ask your health care provider.  Monitor your blood pressure at home as told by your health care provider.  Keep all follow-up visits as told by your health care provider. This is important. Medicines  Take over-the-counter and prescription medicines only as told by your health care provider. Follow directions carefully. Blood pressure medicines must be taken as prescribed.  Do not skip doses of blood pressure medicine. Doing this puts you at risk for problems and can make the medicine less effective.  Ask your health care provider about side effects or reactions to medicines that you should watch  for. Contact a health care provider if you:  Think you are having a reaction to a medicine you are taking.  Have headaches that keep coming back (recurring).  Feel dizzy.  Have swelling in your ankles.  Have trouble with your vision. Get help right away if you:  Develop a severe headache or confusion.  Have unusual weakness or numbness.  Feel faint.  Have severe pain in your chest or abdomen.  Vomit repeatedly.  Have trouble breathing. Summary  Hypertension is when the force of blood pumping through your arteries is too strong. If this condition is not controlled, it may put you at risk for serious complications.  Your personal target blood pressure may vary depending on your medical conditions, your age, and other factors. For most people, a normal blood pressure is less than 120/80.  Hypertension is treated with lifestyle changes, medicines, or a combination of both. Lifestyle changes include losing weight, eating a healthy, low-sodium diet, exercising more, and limiting alcohol. This information is not intended to replace advice given to you by your health care provider. Make sure you discuss any questions you have with your health care provider. Document Revised: 02/04/2018 Document Reviewed: 02/04/2018 Elsevier Patient Education  2020 Elsevier Inc.      [name], MD [facility]

## 2020-01-10 NOTE — Telephone Encounter (Signed)
Pt calling with dates off of card for covid vaccine  1st dose 07/06/2019  2nd dose 0216/2021

## 2020-01-10 NOTE — Assessment & Plan Note (Signed)
Well-controlled hypertension. Continue present medications.  No changes. 

## 2020-01-10 NOTE — Assessment & Plan Note (Signed)
Borderline diabetes.  On no medications.  Continue diet and nutrition along with increased physical activity.

## 2020-01-11 LAB — COMPREHENSIVE METABOLIC PANEL
ALT: 20 IU/L (ref 0–44)
AST: 23 IU/L (ref 0–40)
Albumin/Globulin Ratio: 1.7 (ref 1.2–2.2)
Albumin: 4.3 g/dL (ref 3.8–4.8)
Alkaline Phosphatase: 58 IU/L (ref 48–121)
BUN/Creatinine Ratio: 17 (ref 10–24)
BUN: 19 mg/dL (ref 8–27)
Bilirubin Total: 0.3 mg/dL (ref 0.0–1.2)
CO2: 25 mmol/L (ref 20–29)
Calcium: 10 mg/dL (ref 8.6–10.2)
Chloride: 103 mmol/L (ref 96–106)
Creatinine, Ser: 1.14 mg/dL (ref 0.76–1.27)
GFR calc Af Amer: 77 mL/min/{1.73_m2} (ref >59)
GFR calc non Af Amer: 66 mL/min/{1.73_m2} (ref >59)
Globulin, Total: 2.6 g/dL (ref 1.5–4.5)
Glucose: 102 mg/dL — ABNORMAL HIGH (ref 65–99)
Potassium: 3.9 mmol/L (ref 3.5–5.2)
Sodium: 140 mmol/L (ref 134–144)
Total Protein: 6.9 g/dL (ref 6.0–8.5)

## 2020-01-11 LAB — LIPID PANEL
Chol/HDL Ratio: 4 ratio (ref 0.0–5.0)
Cholesterol, Total: 173 mg/dL (ref 100–199)
HDL: 43 mg/dL (ref >39)
LDL Chol Calc (NIH): 110 mg/dL — ABNORMAL HIGH (ref 0–99)
Triglycerides: 112 mg/dL (ref 0–149)
VLDL Cholesterol Cal: 20 mg/dL (ref 5–40)

## 2020-01-11 LAB — HEMOGLOBIN A1C
Est. average glucose Bld gHb Est-mCnc: 131 mg/dL
Hgb A1c MFr Bld: 6.2 % — ABNORMAL HIGH (ref 4.8–5.6)

## 2020-01-12 DIAGNOSIS — Z20822 Contact with and (suspected) exposure to covid-19: Secondary | ICD-10-CM | POA: Diagnosis not present

## 2020-01-14 NOTE — Progress Notes (Signed)
  Radiation Oncology         [phone]) [phone] ________________________________  Name: [name] [name] MRN: [id number]  Date: 10/18/2019  DOB: Sep 15, 1950  End of Treatment Note  Diagnosis:   69 y.o. gentleman with Stage T1c adenocarcinoma of the prostate with Gleason score of 4+3, and PSA of 14.2     Indication for treatment:  Curative, Definitive Radiotherapy       Radiation treatment dates:   09/09/19 - 10/18/19, concurrent with ST-ADT (ADT started on 07/01/2019)  Site/dose:   The prostate was treated to 70 Gy in 28 fractions of 2.5 Gy  Beams/energy:   The patient was treated with IMRT using volumetric arc therapy delivering 6 MV X-rays to clockwise and counterclockwise circumferential arcs with a 90 degree collimator offset to avoid dose scalloping.  Image guidance was performed with daily cone beam CT prior to each fraction to align to gold markers in the prostate and assure proper bladder and rectal fill volumes.  Immobilization was achieved with BodyFix custom mold.  Narrative: The patient tolerated radiation treatment relatively well with only minor urinary irritation and modest fatigue.  He experienced mild dysuria, weakened flow of stream, intermittency, hesitancy, urgency, frequency and nocturia x2, all of which improved with Flomax which was started during his course of treatment.  He denied any bowel issues with constipation or diarrhea.  Plan: The patient has completed radiation treatment. He will return to radiation oncology clinic for routine followup in one month. I advised him to call or return sooner if he has any questions or concerns related to his recovery or treatment. ________________________________  Sheral Apley. Tammi Klippel, M.D.

## 2020-02-03 DIAGNOSIS — N401 Enlarged prostate with lower urinary tract symptoms: Secondary | ICD-10-CM | POA: Diagnosis not present

## 2020-02-03 DIAGNOSIS — R351 Nocturia: Secondary | ICD-10-CM | POA: Diagnosis not present

## 2020-02-03 DIAGNOSIS — C61 Malignant neoplasm of prostate: Secondary | ICD-10-CM | POA: Diagnosis not present

## 2020-02-21 DIAGNOSIS — N3040 Irradiation cystitis without hematuria: Secondary | ICD-10-CM | POA: Diagnosis not present

## 2020-02-21 DIAGNOSIS — R311 Benign essential microscopic hematuria: Secondary | ICD-10-CM | POA: Diagnosis not present

## 2020-03-19 ENCOUNTER — Other Ambulatory Visit: Payer: Self-pay | Admitting: Radiation Oncology

## 2020-04-03 DIAGNOSIS — C61 Malignant neoplasm of prostate: Secondary | ICD-10-CM | POA: Diagnosis not present

## 2020-04-03 DIAGNOSIS — K627 Radiation proctitis: Secondary | ICD-10-CM | POA: Diagnosis not present

## 2020-04-03 DIAGNOSIS — R311 Benign essential microscopic hematuria: Secondary | ICD-10-CM | POA: Diagnosis not present

## 2020-04-10 ENCOUNTER — Other Ambulatory Visit: Payer: Self-pay

## 2020-04-10 ENCOUNTER — Ambulatory Visit (AMBULATORY_SURGERY_CENTER): Payer: Self-pay

## 2020-04-10 VITALS — Ht 74.0 in | Wt 305.0 lb

## 2020-04-10 DIAGNOSIS — Z86010 Personal history of colonic polyps: Secondary | ICD-10-CM

## 2020-04-10 MED ORDER — NA SULFATE-K SULFATE-MG SULF 17.5-3.13-1.6 GM/177ML PO SOLN: 1.0000 | Freq: Once | ORAL | 0 refills | Status: AC

## 2020-04-10 NOTE — Progress Notes (Signed)
No egg or soy allergy known to patient  No issues with past sedation with any surgeries or procedures No intubation problems in the past  No FH of Malignant Hyperthermia No diet pills per patient No home 02 use per patient  No blood thinners per patient  Pt denies issues with constipation  No A fib or A flutter  EMMI video via MyChart  COVID 19 guidelines implemented in [facility] today with Pt and RN  Coupon given to pt in [facility] today , Code to Pharmacy  COVID vaccines completed on 07/2019 per pt;  Due to the COVID-19 pandemic we are asking patients to follow these guidelines. Please only bring one care partner. Please be aware that your care partner may wait in the car in the parking lot or if they feel like they will be too hot to wait in the car, they may wait in the lobby on the 4th floor. All care partners are required to wear a mask the entire time (we do not have any that we can provide them), they need to practice social distancing, and we will do a Covid check for all patient's and care partners when you arrive. Also we will check their temperature and your temperature. If the care partner waits in their car they need to stay in the parking lot the entire time and we will call them on their cell phone when the patient is ready for discharge so they can bring the car to the front of the building. Also all patient's will need to wear a mask into building.  

## 2020-04-11 ENCOUNTER — Encounter: Payer: Self-pay | Admitting: Gastroenterology

## 2020-04-13 ENCOUNTER — Telehealth: Payer: Self-pay | Admitting: Gastroenterology

## 2020-04-13 NOTE — Telephone Encounter (Signed)
Patient called states he needs the prep medication sent to the pharmacy

## 2020-04-13 NOTE — Telephone Encounter (Signed)
Returned patient call-pt stated with insurance that cost of prep for [employer] was $113.00.    Offered other options of Sutab for $40 approximately or Miralax for about $20.00.  Pt stated he would rather do the Miralax.  Pt agreed to come to [facility] to pick up instruction on the 3rd floor tomorrow.  New prep instructions prepared and taken upstairs to 3rd floor.

## 2020-04-14 ENCOUNTER — Other Ambulatory Visit: Payer: Self-pay

## 2020-04-14 ENCOUNTER — Ambulatory Visit (INDEPENDENT_AMBULATORY_CARE_PROVIDER_SITE_OTHER): Payer: Medicare PPO | Admitting: Registered Nurse

## 2020-04-14 DIAGNOSIS — Z23 Encounter for immunization: Secondary | ICD-10-CM | POA: Diagnosis not present

## 2020-04-24 ENCOUNTER — Ambulatory Visit (AMBULATORY_SURGERY_CENTER): Payer: Medicare PPO | Admitting: Gastroenterology

## 2020-04-24 ENCOUNTER — Other Ambulatory Visit: Payer: Self-pay

## 2020-04-24 ENCOUNTER — Encounter: Payer: Self-pay | Admitting: Gastroenterology

## 2020-04-24 VITALS — BP 120/80 | HR 60 | Temp 97.1°F | Resp 13 | Ht 74.0 in | Wt 305.0 lb

## 2020-04-24 DIAGNOSIS — D123 Benign neoplasm of transverse colon: Secondary | ICD-10-CM

## 2020-04-24 DIAGNOSIS — Z1211 Encounter for screening for malignant neoplasm of colon: Secondary | ICD-10-CM | POA: Diagnosis not present

## 2020-04-24 DIAGNOSIS — D125 Benign neoplasm of sigmoid colon: Secondary | ICD-10-CM

## 2020-04-24 DIAGNOSIS — I10 Essential (primary) hypertension: Secondary | ICD-10-CM | POA: Diagnosis not present

## 2020-04-24 DIAGNOSIS — K635 Polyp of colon: Secondary | ICD-10-CM

## 2020-04-24 DIAGNOSIS — Z86010 Personal history of colonic polyps: Secondary | ICD-10-CM | POA: Diagnosis not present

## 2020-04-24 MED ORDER — SODIUM CHLORIDE 0.9 % IV SOLN: 500.0000 mL | Freq: Once | INTRAVENOUS | Status: DC

## 2020-04-24 NOTE — Progress Notes (Signed)
Vitals by MO 

## 2020-04-24 NOTE — Op Note (Signed)
[facility] [place] Patient Name: [name] [name] Procedure Date: 04/24/2020 8:00 AM MRN: [id number] Endoscopist: Remo Lipps P. Havery Moros , MD Age: 69 Referring MD:  Date of Birth: March 18, 1951 Gender: Male Account #: [id number] Procedure:                Colonoscopy Indications:              Screening for colorectal malignant neoplasm Medicines:                Monitored Anesthesia Care Procedure:                Pre-Anesthesia Assessment:                           - Prior to the procedure, a History and Physical                            was performed, and patient medications and                            allergies were reviewed. The patient's tolerance of                            previous anesthesia was also reviewed. The risks                            and benefits of the procedure and the sedation                            options and risks were discussed with the patient.                            All questions were answered, and informed consent                            was obtained. Prior Anticoagulants: The patient has                            taken no previous anticoagulant or antiplatelet                            agents. ASA Grade Assessment: II - A patient with                            mild systemic disease. After reviewing the risks                            and benefits, the patient was deemed in                            satisfactory condition to undergo the procedure.                           After obtaining informed consent, the colonoscope  was passed under direct vision. Throughout the                            procedure, the patient's blood pressure, pulse, and                            oxygen saturations were monitored continuously. The                            Colonoscope was introduced through the anus and                            advanced to the the cecum, identified by                            appendiceal orifice  and ileocecal valve. The                            colonoscopy was performed without difficulty. The                            patient tolerated the procedure well. The quality                            of the bowel preparation was good. The ileocecal                            valve, appendiceal orifice, and rectum were                            photographed. Scope In: 8:03:24 AM Scope Out: 8:21:02 AM Scope Withdrawal Time: 0 hours 13 minutes 25 seconds  Total Procedure Duration: 0 hours 17 minutes 38 seconds  Findings:                 The perianal and digital rectal examinations were                            normal.                           Multiple medium-mouthed diverticula were found in                            the entire colon.                           There was a medium-sized lipoma, in the ascending                            colon.                           Two sessile polyps were found in the splenic  flexure. The polyps were 3 to 4 mm in size. These                            polyps were removed with a cold snare. Resection                            and retrieval were complete.                           A 3 mm polyp was found in the sigmoid colon. The                            polyp was sessile. The polyp was removed with a                            cold snare. Resection and retrieval were complete.                           Internal hemorrhoids were found during                            retroflexion. The hemorrhoids were moderate.                           The exam was otherwise without abnormality. Complications:            No immediate complications. Estimated blood loss:                            Minimal. Estimated Blood Loss:     Estimated blood loss was minimal. Impression:               - Diverticulosis in the entire examined colon.                           - Medium-sized lipoma in the ascending colon.                            - Two 3 to 4 mm polyps at the splenic flexure,                            removed with a cold snare. Resected and retrieved.                           - One 3 mm polyp in the sigmoid colon, removed with                            a cold snare. Resected and retrieved.                           - Internal hemorrhoids.                           - The examination was otherwise normal. Recommendation:           -  Patient has a contact number available for                            emergencies. The signs and symptoms of potential                            delayed complications were discussed with the                            patient. Return to normal activities tomorrow.                            Written discharge instructions were provided to the                            patient.                           - Resume previous diet.                           - Continue present medications.                           - Await pathology results. Remo Lipps P. Atheena Spano, MD 04/24/2020 8:32:30 AM This report has been signed electronically.

## 2020-04-24 NOTE — Progress Notes (Signed)
No problems noted in the recovery room. Maw  When I spoke with pt's wife about the findings.  She mentioned pt had had some symptoms with his hemrroids for the past couple of weeks. He has only been doing a warm sitz bath.  I recommended to try OTC prep-poration H with Hydrocortisone suppositories.  If this is not helping his sx, to call us back and speak with Dr. Havery Moros.  I aso gave tham info about hemorrhoidal banding to read about.  I spoke with Dr. Havery Moros and he is aware and agree with advise.  maw

## 2020-04-24 NOTE — Progress Notes (Signed)
Called to room to assist during endoscopic procedure.  Patient ID and intended procedure confirmed with present staff. Received instructions for my participation in the procedure from the performing physician.  

## 2020-04-24 NOTE — Progress Notes (Signed)
Report given to PACU, vss 

## 2020-04-24 NOTE — Patient Instructions (Signed)
Handouts were given to you on polyps, diverticulosis, and hemorrhoids. You may resume your current medications today. Await biopsy results. Please call if any questions or concerns.   YOU HAD AN ENDOSCOPIC PROCEDURE TODAY AT THE [facility]:   Refer to the procedure report that was given to you for any specific questions about what was found during the examination.  If the procedure report does not answer your questions, please call your gastroenterologist to clarify.  If you requested that your care partner not be given the details of your procedure findings, then the procedure report has been included in a sealed envelope for you to review at your convenience later.  YOU SHOULD EXPECT: Some feelings of bloating in the abdomen. Passage of more gas than usual.  Walking can help get rid of the air that was put into your GI tract during the procedure and reduce the bloating. If you had a lower endoscopy (such as a colonoscopy or flexible sigmoidoscopy) you may notice spotting of blood in your stool or on the toilet paper. If you underwent a bowel prep for your procedure, you may not have a normal bowel movement for a few days.  Please Note:  You might notice some irritation and congestion in your nose or some drainage.  This is from the oxygen used during your procedure.  There is no need for concern and it should clear up in a day or so.  SYMPTOMS TO REPORT IMMEDIATELY:   Following lower endoscopy (colonoscopy or flexible sigmoidoscopy):  Excessive amounts of blood in the stool  Significant tenderness or worsening of abdominal pains  Swelling of the abdomen that is new, acute  Fever of 100F or higher   For urgent or emergent issues, a gastroenterologist can be reached at any hour by calling [phone]. Do not use MyChart messaging for urgent concerns.    DIET:  We do recommend a small meal at first, but then you may proceed to your regular diet.  Drink plenty of fluids but  you should avoid alcoholic beverages for 24 hours.  ACTIVITY:  You should plan to take it easy for the rest of today and you should NOT DRIVE or use heavy machinery until tomorrow (because of the sedation medicines used during the test).    FOLLOW UP: Our staff will call the number listed on your records 48-72 hours following your procedure to check on you and address any questions or concerns that you may have regarding the information given to you following your procedure. If we do not reach you, we will leave a message.  We will attempt to reach you two times.  During this call, we will ask if you have developed any symptoms of COVID 19. If you develop any symptoms (ie: fever, flu-like symptoms, shortness of breath, cough etc.) before then, please call [phone].  If you test positive for Covid 19 in the 2 weeks post procedure, please call and report this information to us.    If any biopsies were taken you will be contacted by phone or by letter within the next 1-3 weeks.  Please call us at [phone] if you have not heard about the biopsies in 3 weeks.    SIGNATURES/CONFIDENTIALITY: You and/or your care partner have signed paperwork which will be entered into your electronic medical record.  These signatures attest to the fact that that the information above on your After Visit Summary has been reviewed and is understood.  Full responsibility of the   confidentiality of this discharge information lies with you and/or your care-partner. 

## 2020-04-26 ENCOUNTER — Telehealth: Payer: Self-pay | Admitting: *Deleted

## 2020-04-26 ENCOUNTER — Telehealth: Payer: Self-pay

## 2020-04-26 NOTE — Telephone Encounter (Signed)
Attempted 2nd f/u phone call. No answer. Left message.  °

## 2020-04-26 NOTE — Telephone Encounter (Signed)
No answer, left message to call back later today, B.Juanisha Bautch RN. 

## 2020-05-01 DIAGNOSIS — C61 Malignant neoplasm of prostate: Secondary | ICD-10-CM | POA: Diagnosis not present

## 2020-05-08 DIAGNOSIS — C61 Malignant neoplasm of prostate: Secondary | ICD-10-CM | POA: Diagnosis not present

## 2020-06-04 ENCOUNTER — Other Ambulatory Visit: Payer: Self-pay | Admitting: Emergency Medicine

## 2020-06-04 DIAGNOSIS — R0981 Nasal congestion: Secondary | ICD-10-CM

## 2020-06-04 NOTE — Telephone Encounter (Signed)
Requested Prescriptions  Pending Prescriptions Disp Refills  . triamcinolone (NASACORT) 55 MCG/ACT AERO nasal inhaler [Pharmacy Med Name: TRIAMCINOLONE 55 MCG NASAL SPR] 16.9 each 12    Sig: PLACE 2 SPRAYS INTO THE NOSE DAILY.     Ear, Nose, and Throat: Nasal Preparations - Corticosteroids Passed - 06/04/2020  9:02 AM      Passed - Valid encounter within last 12 months    Recent Outpatient Visits          4 months ago Essential hypertension   Primary Care at [facility], Ines Bloomer, MD   10 months ago Essential hypertension   Primary Care at [facility], [place], [place]   12 months ago Acute non-recurrent maxillary sinusitis   Primary Care at [facility], Ines Bloomer, MD   1 year ago Hypertension, unspecified type   Primary Care at [facility], Ines Bloomer, MD   1 year ago Hypertension, unspecified type   Primary Care at [facility], Ines Bloomer, MD      Future Appointments            In 1 month Sagard[place], Ines Bloomer, MD Primary Care at [place]

## 2020-07-12 ENCOUNTER — Ambulatory Visit: Payer: Medicare PPO | Admitting: Emergency Medicine

## 2020-07-12 ENCOUNTER — Other Ambulatory Visit: Payer: Self-pay

## 2020-07-12 ENCOUNTER — Encounter: Payer: Self-pay | Admitting: Emergency Medicine

## 2020-07-12 VITALS — BP 133/77 | HR 74 | Temp 96.7°F | Resp 16 | Ht 74.0 in | Wt 306.0 lb

## 2020-07-12 DIAGNOSIS — I10 Essential (primary) hypertension: Secondary | ICD-10-CM

## 2020-07-12 DIAGNOSIS — F418 Other specified anxiety disorders: Secondary | ICD-10-CM | POA: Diagnosis not present

## 2020-07-12 DIAGNOSIS — E785 Hyperlipidemia, unspecified: Secondary | ICD-10-CM

## 2020-07-12 DIAGNOSIS — C61 Malignant neoplasm of prostate: Secondary | ICD-10-CM

## 2020-07-12 DIAGNOSIS — R7303 Prediabetes: Secondary | ICD-10-CM | POA: Diagnosis not present

## 2020-07-12 MED ORDER — LISINOPRIL-HYDROCHLOROTHIAZIDE 10-12.5 MG PO TABS: 1.0000 | ORAL_TABLET | Freq: Every day | ORAL | 3 refills | Status: DC

## 2020-07-12 MED ORDER — ALPRAZOLAM 0.5 MG PO TABS: 0.5000 mg | ORAL_TABLET | Freq: Every day | ORAL | 3 refills | Status: DC | PRN

## 2020-07-12 NOTE — Progress Notes (Signed)
[name] [name] 70 y.o.   Chief Complaint  Patient presents with  . Hypertension    Follow up 6 month  . Medication Refill    Alprazolam and Lisinopril    HISTORY OF PRESENT ILLNESS: This is a 70 y.o. male with history of hypertension, prediabetes, dyslipidemia, and history of prostate cancer here for follow-up and medication refill.  Doing well.  Has no complaints or medical concerns. A1A. Fully vaccinated against COVID with a booster. #1 hypertension: On Zestoretic 10-12.5 mg daily. #2 dyslipidemia: On rosuvastatin 10 mg daily.  However started having cramping and is now taking it once or twice a week. #3 prediabetes #4 history of prostate cancer.  Doing well.   HPI   Prior to Admission medications   Medication Sig Start Date End Date Taking? Authorizing Provider  ALPRAZolam Duanne Moron) 1 MG tablet TAKE HALF TABLET BY MOUTH EVERY DAY AT BEDTIME AS NEEDED 11/01/19  Yes Holten Spano, Ines Bloomer, MD  multivitamin (ONE-A-DAY MEN'S) TABS tablet Take 1 tablet by mouth daily.   Yes [provider]  rosuvastatin (CRESTOR) 10 MG tablet TAKE 1 TABLET BY MOUTH EVERY DAY 09/19/19  Yes Earlyn Sylvan, Ines Bloomer, MD  triamcinolone (NASACORT) 55 MCG/ACT AERO nasal inhaler PLACE 2 SPRAYS INTO THE NOSE DAILY. 06/04/20  Yes Keely Drennan, Ines Bloomer, MD  finasteride (PROSCAR) 5 MG tablet Take 5 mg by mouth daily. Patient not taking: Reported on 07/12/2020 01/24/20   [provider]  lisinopril-hydrochlorothiazide (ZESTORETIC) 10-12.5 MG tablet Take 1 tablet by mouth daily. 01/10/20 04/24/20  Horald Pollen, MD  tamsulosin (FLOMAX) 0.4 MG CAPS capsule Take 1 capsule (0.4 mg total) by mouth daily after supper. Patient not taking: Reported on 07/12/2020 09/24/19   Tyler Pita, MD    Allergies  Allergen Reactions  . Aspirin     REACTION: upsets stomach  . Statins     REACTION: UPSETS STOMACH  . Gadolinium Derivatives Nausea And Vomiting    Pt was given 20 ml multihance and became  nauseated and vomited several times.     Patient Active Problem List   Diagnosis Date Noted  . Body mass index (BMI) of 38.0-38.9 in adult 01/10/2020  . Diverticulosis 01/10/2020  . Prediabetes 01/10/2020  . Dyslipidemia 01/10/2020  . Malignant neoplasm of prostate ([place]) 06/08/2019  . Prostate enlargement 08/29/2017  . Gout 10/25/2014  . Hypertension   . BPH (benign prostatic hypertrophy)   . Kidney stones   . Prostate nodule     Past Medical History:  Diagnosis Date  . Allergy    seasonal allergies  . Anxiety    at times  . Arthritis    generalized  . Blood transfusion without reported diagnosis 2013   when had diverticulitis flare  . BPH (benign prostatic hypertrophy)   . Colon polyp    diverticular bleed with transfusion  . Diverticulosis   . Hyperlipidemia    on meds  . Hypertension    on meds  . Kidney stones    hx of 15-20 kidney stones   . Obesity, unspecified   . Prostate cancer [facility]) 2020   dx 05/2019  . Prostate nodule [id number]    Past Surgical History:  Procedure Laterality Date  . ANKLE FUSION Left 1991  . COLONOSCOPY  2011   DB-F/V-miralax(good)-TICS/3 HPP  . HERNIA REPAIR N/A    umbilical   . POLYPECTOMY  2011   3 HPP  . TRANSURETHRAL RESECTION OF PROSTATE  2020    Social History   Socioeconomic History  .  Marital status: Married    Spouse name: Not on file  . Number of children: 2  . Years of education: Not on file  . Highest education level: Not on file  Occupational History  . Not on file  Tobacco Use  . Smoking status: Never Smoker  . Smokeless tobacco: Never Used  Vaping Use  . Vaping Use: Never used  Substance and Sexual Activity  . Alcohol use: No  . Drug use: No  . Sexual activity: Yes  Other Topics Concern  . Not on file  Social History Narrative  . Not on file   Social Determinants of Health   Financial Resource Strain: Not on file  Food Insecurity: Not on file  Transportation Needs: Not on file  Physical  Activity: Not on file  Stress: Not on file  Social Connections: Not on file  Intimate Partner Violence: Not on file    Family History  Problem Relation Age of Onset  . Diabetes Mother   . Breast cancer Mother 4  . Prostate cancer Father        patient did not have a relationship with his father but understands he had prostate ca  . Colon polyps Father 63  . Colon cancer Father 26  . Diabetes Sister   . Pancreatic cancer Neg Hx   . Esophageal cancer Neg Hx   . Stomach cancer Neg Hx   . Rectal cancer Neg Hx      Review of Systems  Constitutional: Negative.  Negative for chills and fever.  HENT: Negative.  Negative for congestion and sore throat.   Respiratory: Negative.  Negative for cough and shortness of breath.   Cardiovascular: Negative.  Negative for chest pain and palpitations.  Gastrointestinal: Negative.  Negative for abdominal pain, blood in stool, melena and nausea.  Genitourinary: Negative.  Negative for hematuria.  Musculoskeletal: Negative.  Negative for back pain, myalgias and neck pain.  Skin: Negative.   Neurological: Negative.  Negative for dizziness and headaches.  All other systems reviewed and are negative.   Today's Vitals   07/12/20 0810  BP: 133/77  Pulse: 74  Resp: 16  Temp: (!) 96.7 F (35.9 C)  TempSrc: Temporal  SpO2: 97%  Weight: (!) 306 lb (138.8 kg)  Height: 6\' 2"  (1.88 m)   Body mass index is 39.29 kg/m. Wt Readings from Last 3 Encounters:  07/12/20 (!) 306 lb (138.8 kg)  04/24/20 (!) 305 lb (138.3 kg)  04/10/20 (!) 305 lb (138.3 kg)    Physical Exam Vitals reviewed.  Constitutional:      Appearance: Normal appearance.  HENT:     Head: Normocephalic and atraumatic.  Eyes:     Extraocular Movements: Extraocular movements intact.     Conjunctiva/sclera: Conjunctivae normal.     Pupils: Pupils are equal, round, and reactive to light.  Cardiovascular:     Rate and Rhythm: Normal rate and regular rhythm.     Pulses: Normal  pulses.     Heart sounds: Normal heart sounds.  Pulmonary:     Effort: Pulmonary effort is normal.     Breath sounds: Normal breath sounds.  Abdominal:     Palpations: Abdomen is soft.     Tenderness: There is no abdominal tenderness.  Musculoskeletal:        General: Normal range of motion.     Cervical back: Normal range of motion and neck supple.  Skin:    General: Skin is warm and dry.  Capillary Refill: Capillary refill takes less than 2 seconds.  Neurological:     General: No focal deficit present.     Mental Status: He is alert and oriented to person, place, and time.  Psychiatric:        Mood and Affect: Mood normal.        Behavior: Behavior normal.      ASSESSMENT & PLAN: Clinically stable.  No medical concerns identified during this visit. Continue medications.  Take rosuvastatin twice a week to avoid significant side effects. Follow-up in 6 months.  [name] [name] was seen today for hypertension and medication refill.  Diagnoses and all orders for this visit:  Essential hypertension -     lisinopril-hydrochlorothiazide (ZESTORETIC) 10-12.5 MG tablet; Take 1 tablet by mouth daily.  Malignant neoplasm of prostate ([place])  Prediabetes  Dyslipidemia  Situational anxiety -     ALPRAZolam (XANAX) 0.5 MG tablet; Take 1 tablet (0.5 mg total) by mouth daily as needed for anxiety.    Patient Instructions       If you have lab work done today you will be contacted with your lab results within the next 2 weeks.  If you have not heard from [place] then please contact us. The fastest way to get your results is to register for My Chart.   IF you received an x-ray today, you will receive an invoice from [facility]. Please contact [facility] at [phone] with questions or concerns regarding your invoice.   IF you received labwork today, you will receive an invoice from [place]. Please contact [facility] at [phone] with questions or concerns  regarding your invoice.   Our billing staff will not be able to assist you with questions regarding bills from these companies.  You will be contacted with the lab results as soon as they are available. The fastest way to get your results is to activate your My Chart account. Instructions are located on the last page of this paperwork. If you have not heard from [place] regarding the results in 2 weeks, please contact this office.     Hypertension, Adult High blood pressure (hypertension) is when the force of blood pumping through the arteries is too strong. The arteries are the blood vessels that carry blood from the heart throughout the body. Hypertension forces the heart to work harder to pump blood and may cause arteries to become narrow or stiff. Untreated or uncontrolled hypertension can cause a heart attack, heart failure, a stroke, kidney disease, and other problems. A blood pressure reading consists of a higher number over a lower number. Ideally, your blood pressure should be below 120/80. The first ("top") number is called the systolic pressure. It is a measure of the pressure in your arteries as your heart beats. The second ("bottom") number is called the diastolic pressure. It is a measure of the pressure in your arteries as the heart relaxes. What are the causes? The exact cause of this condition is not known. There are some conditions that result in or are related to high blood pressure. What increases the risk? Some risk factors for high blood pressure are under your control. The following factors may make you more likely to develop this condition:  Smoking.  Having type 2 diabetes mellitus, high cholesterol, or both.  Not getting enough exercise or physical activity.  Being overweight.  Having too much fat, sugar, calories, or salt (sodium) in your diet.  Drinking too much alcohol. Some risk factors for high blood pressure may  be difficult or impossible to change. Some of these  factors include:  Having chronic kidney disease.  Having a family history of high blood pressure.  Age. Risk increases with age.  Race. You may be at higher risk if you are African American.  Gender. Men are at higher risk than women before age 52. After age 75, women are at higher risk than men.  Having obstructive sleep apnea.  Stress. What are the signs or symptoms? High blood pressure may not cause symptoms. Very high blood pressure (hypertensive crisis) may cause:  Headache.  Anxiety.  Shortness of breath.  Nosebleed.  Nausea and vomiting.  Vision changes.  Severe chest pain.  Seizures. How is this diagnosed? This condition is diagnosed by measuring your blood pressure while you are seated, with your arm resting on a flat surface, your legs uncrossed, and your feet flat on the floor. The cuff of the blood pressure monitor will be placed directly against the skin of your upper arm at the level of your heart. It should be measured at least twice using the same arm. Certain conditions can cause a difference in blood pressure between your right and left arms. Certain factors can cause blood pressure readings to be lower or higher than normal for a short period of time:  When your blood pressure is higher when you are in a health care provider's office than when you are at home, this is called white coat hypertension. Most people with this condition do not need medicines.  When your blood pressure is higher at home than when you are in a health care provider's office, this is called masked hypertension. Most people with this condition may need medicines to control blood pressure. If you have a high blood pressure reading during one visit or you have normal blood pressure with other risk factors, you may be asked to:  Return on a different day to have your blood pressure checked again.  Monitor your blood pressure at home for 1 week or longer. If you are diagnosed with  hypertension, you may have other blood or imaging tests to help your health care provider understand your overall risk for other conditions. How is this treated? This condition is treated by making healthy lifestyle changes, such as eating healthy foods, exercising more, and reducing your alcohol intake. Your health care provider may prescribe medicine if lifestyle changes are not enough to get your blood pressure under control, and if:  Your systolic blood pressure is above 130.  Your diastolic blood pressure is above 80. Your personal target blood pressure may vary depending on your medical conditions, your age, and other factors. Follow these instructions at home: Eating and drinking  Eat a diet that is high in fiber and potassium, and low in sodium, added sugar, and fat. An example eating plan is called the DASH (Dietary Approaches to Stop Hypertension) diet. To eat this way: ? Eat plenty of fresh fruits and vegetables. Try to fill one half of your plate at each meal with fruits and vegetables. ? Eat whole grains, such as whole-wheat pasta, brown rice, or whole-grain bread. Fill about one fourth of your plate with whole grains. ? Eat or drink low-fat dairy products, such as skim milk or low-fat yogurt. ? Avoid fatty cuts of meat, processed or cured meats, and poultry with skin. Fill about one fourth of your plate with lean proteins, such as fish, chicken without skin, beans, eggs, or tofu. ? Avoid pre-made and processed foods.  These tend to be higher in sodium, added sugar, and fat.  Reduce your daily sodium intake. Most people with hypertension should eat less than 1,500 mg of sodium a day.  Do not drink alcohol if: ? Your health care provider tells you not to drink. ? You are pregnant, may be pregnant, or are planning to become pregnant.  If you drink alcohol: ? Limit how much you use to:  0-1 drink a day for women.  0-2 drinks a day for men. ? Be aware of how much alcohol is in  your drink. In the [place] one drink equals one 12 oz bottle of beer (355 mL), one 5 oz glass of wine (148 mL), or one 1 oz glass of hard liquor (44 mL).   Lifestyle  Work with your health care provider to maintain a healthy body weight or to lose weight. Ask what an ideal weight is for you.  Get at least 30 minutes of exercise most days of the week. Activities may include walking, swimming, or biking.  Include exercise to strengthen your muscles (resistance exercise), such as Pilates or lifting weights, as part of your weekly exercise routine. Try to do these types of exercises for 30 minutes at least 3 days a week.  Do not use any products that contain nicotine or tobacco, such as cigarettes, e-cigarettes, and chewing tobacco. If you need help quitting, ask your health care provider.  Monitor your blood pressure at home as told by your health care provider.  Keep all follow-up visits as told by your health care provider. This is important.   Medicines  Take over-the-counter and prescription medicines only as told by your health care provider. Follow directions carefully. Blood pressure medicines must be taken as prescribed.  Do not skip doses of blood pressure medicine. Doing this puts you at risk for problems and can make the medicine less effective.  Ask your health care provider about side effects or reactions to medicines that you should watch for. Contact a health care provider if you:  Think you are having a reaction to a medicine you are taking.  Have headaches that keep coming back (recurring).  Feel dizzy.  Have swelling in your ankles.  Have trouble with your vision. Get help right away if you:  Develop a severe headache or confusion.  Have unusual weakness or numbness.  Feel faint.  Have severe pain in your chest or abdomen.  Vomit repeatedly.  Have trouble breathing. Summary  Hypertension is when the force of blood pumping through your arteries is too  strong. If this condition is not controlled, it may put you at risk for serious complications.  Your personal target blood pressure may vary depending on your medical conditions, your age, and other factors. For most people, a normal blood pressure is less than 120/80.  Hypertension is treated with lifestyle changes, medicines, or a combination of both. Lifestyle changes include losing weight, eating a healthy, low-sodium diet, exercising more, and limiting alcohol. This information is not intended to replace advice given to you by your health care provider. Make sure you discuss any questions you have with your health care provider. Document Revised: 02/04/2018 Document Reviewed: 02/04/2018 Elsevier Patient Education  2021 [employer]      [name], MD [facility]

## 2020-07-12 NOTE — Patient Instructions (Addendum)
   If you have lab work done today you will be contacted with your lab results within the next 2 weeks.  If you have not heard from us then please contact us. The fastest way to get your results is to register for My Chart.   IF you received an x-ray today, you will receive an invoice from [facility]. Please contact [facility] at [phone] with questions or concerns regarding your invoice.   IF you received labwork today, you will receive an invoice from [facility]. Please contact [facility] at [phone] with questions or concerns regarding your invoice.   Our billing staff will not be able to assist you with questions regarding bills from these companies.  You will be contacted with the lab results as soon as they are available. The fastest way to get your results is to activate your My Chart account. Instructions are located on the last page of this paperwork. If you have not heard from us regarding the results in 2 weeks, please contact this office.     Hypertension, Adult High blood pressure (hypertension) is when the force of blood pumping through the arteries is too strong. The arteries are the blood vessels that carry blood from the heart throughout the body. Hypertension forces the heart to work harder to pump blood and may cause arteries to become narrow or stiff. Untreated or uncontrolled hypertension can cause a heart attack, heart failure, a stroke, kidney disease, and other problems. A blood pressure reading consists of a higher number over a lower number. Ideally, your blood pressure should be below 120/80. The first ("top") number is called the systolic pressure. It is a measure of the pressure in your arteries as your heart beats. The second ("bottom") number is called the diastolic pressure. It is a measure of the pressure in your arteries as the heart relaxes. What are the causes? The exact cause of this condition is not known. There are some conditions  that result in or are related to high blood pressure. What increases the risk? Some risk factors for high blood pressure are under your control. The following factors may make you more likely to develop this condition:  Smoking.  Having type 2 diabetes mellitus, high cholesterol, or both.  Not getting enough exercise or physical activity.  Being overweight.  Having too much fat, sugar, calories, or salt (sodium) in your diet.  Drinking too much alcohol. Some risk factors for high blood pressure may be difficult or impossible to change. Some of these factors include:  Having chronic kidney disease.  Having a family history of high blood pressure.  Age. Risk increases with age.  Race. You may be at higher risk if you are African American.  Gender. Men are at higher risk than women before age 45. After age 65, women are at higher risk than men.  Having obstructive sleep apnea.  Stress. What are the signs or symptoms? High blood pressure may not cause symptoms. Very high blood pressure (hypertensive crisis) may cause:  Headache.  Anxiety.  Shortness of breath.  Nosebleed.  Nausea and vomiting.  Vision changes.  Severe chest pain.  Seizures. How is this diagnosed? This condition is diagnosed by measuring your blood pressure while you are seated, with your arm resting on a flat surface, your legs uncrossed, and your feet flat on the floor. The cuff of the blood pressure monitor will be placed directly against the skin of your upper arm at the level of your heart.   It should be measured at least twice using the same arm. Certain conditions can cause a difference in blood pressure between your right and left arms. Certain factors can cause blood pressure readings to be lower or higher than normal for a short period of time:  When your blood pressure is higher when you are in a health care provider's office than when you are at home, this is called white coat hypertension.  Most people with this condition do not need medicines.  When your blood pressure is higher at home than when you are in a health care provider's office, this is called masked hypertension. Most people with this condition may need medicines to control blood pressure. If you have a high blood pressure reading during one visit or you have normal blood pressure with other risk factors, you may be asked to:  Return on a different day to have your blood pressure checked again.  Monitor your blood pressure at home for 1 week or longer. If you are diagnosed with hypertension, you may have other blood or imaging tests to help your health care provider understand your overall risk for other conditions. How is this treated? This condition is treated by making healthy lifestyle changes, such as eating healthy foods, exercising more, and reducing your alcohol intake. Your health care provider may prescribe medicine if lifestyle changes are not enough to get your blood pressure under control, and if:  Your systolic blood pressure is above 130.  Your diastolic blood pressure is above 80. Your personal target blood pressure may vary depending on your medical conditions, your age, and other factors. Follow these instructions at home: Eating and drinking  Eat a diet that is high in fiber and potassium, and low in sodium, added sugar, and fat. An example eating plan is called the DASH (Dietary Approaches to Stop Hypertension) diet. To eat this way: ? Eat plenty of fresh fruits and vegetables. Try to fill one half of your plate at each meal with fruits and vegetables. ? Eat whole grains, such as whole-wheat pasta, brown rice, or whole-grain bread. Fill about one fourth of your plate with whole grains. ? Eat or drink low-fat dairy products, such as skim milk or low-fat yogurt. ? Avoid fatty cuts of meat, processed or cured meats, and poultry with skin. Fill about one fourth of your plate with lean proteins, such  as fish, chicken without skin, beans, eggs, or tofu. ? Avoid pre-made and processed foods. These tend to be higher in sodium, added sugar, and fat.  Reduce your daily sodium intake. Most people with hypertension should eat less than 1,500 mg of sodium a day.  Do not drink alcohol if: ? Your health care provider tells you not to drink. ? You are pregnant, may be pregnant, or are planning to become pregnant.  If you drink alcohol: ? Limit how much you use to:  0-1 drink a day for women.  0-2 drinks a day for men. ? Be aware of how much alcohol is in your drink. In the [place] one drink equals one 12 oz bottle of beer (355 mL), one 5 oz glass of wine (148 mL), or one 1 oz glass of hard liquor (44 mL).   Lifestyle  Work with your health care provider to maintain a healthy body weight or to lose weight. Ask what an ideal weight is for you.  Get at least 30 minutes of exercise most days of the week. Activities may include walking, swimming, or   biking.  Include exercise to strengthen your muscles (resistance exercise), such as Pilates or lifting weights, as part of your weekly exercise routine. Try to do these types of exercises for 30 minutes at least 3 days a week.  Do not use any products that contain nicotine or tobacco, such as cigarettes, e-cigarettes, and chewing tobacco. If you need help quitting, ask your health care provider.  Monitor your blood pressure at home as told by your health care provider.  Keep all follow-up visits as told by your health care provider. This is important.   Medicines  Take over-the-counter and prescription medicines only as told by your health care provider. Follow directions carefully. Blood pressure medicines must be taken as prescribed.  Do not skip doses of blood pressure medicine. Doing this puts you at risk for problems and can make the medicine less effective.  Ask your health care provider about side effects or reactions to medicines that you  should watch for. Contact a health care provider if you:  Think you are having a reaction to a medicine you are taking.  Have headaches that keep coming back (recurring).  Feel dizzy.  Have swelling in your ankles.  Have trouble with your vision. Get help right away if you:  Develop a severe headache or confusion.  Have unusual weakness or numbness.  Feel faint.  Have severe pain in your chest or abdomen.  Vomit repeatedly.  Have trouble breathing. Summary  Hypertension is when the force of blood pumping through your arteries is too strong. If this condition is not controlled, it may put you at risk for serious complications.  Your personal target blood pressure may vary depending on your medical conditions, your age, and other factors. For most people, a normal blood pressure is less than 120/80.  Hypertension is treated with lifestyle changes, medicines, or a combination of both. Lifestyle changes include losing weight, eating a healthy, low-sodium diet, exercising more, and limiting alcohol. This information is not intended to replace advice given to you by your health care provider. Make sure you discuss any questions you have with your health care provider. Document Revised: 02/04/2018 Document Reviewed: 02/04/2018 Elsevier Patient Education  2021 [employer]  

## 2020-08-03 DIAGNOSIS — C61 Malignant neoplasm of prostate: Secondary | ICD-10-CM | POA: Diagnosis not present

## 2020-08-10 DIAGNOSIS — C61 Malignant neoplasm of prostate: Secondary | ICD-10-CM | POA: Diagnosis not present

## 2020-08-10 DIAGNOSIS — R351 Nocturia: Secondary | ICD-10-CM | POA: Diagnosis not present

## 2020-09-04 DIAGNOSIS — R300 Dysuria: Secondary | ICD-10-CM | POA: Diagnosis not present

## 2020-09-04 DIAGNOSIS — N3941 Urge incontinence: Secondary | ICD-10-CM | POA: Diagnosis not present

## 2020-09-04 DIAGNOSIS — R350 Frequency of micturition: Secondary | ICD-10-CM | POA: Diagnosis not present

## 2020-09-04 DIAGNOSIS — R3915 Urgency of urination: Secondary | ICD-10-CM | POA: Diagnosis not present

## 2020-09-04 DIAGNOSIS — N3000 Acute cystitis without hematuria: Secondary | ICD-10-CM | POA: Diagnosis not present

## 2020-09-26 DIAGNOSIS — M170 Bilateral primary osteoarthritis of knee: Secondary | ICD-10-CM | POA: Diagnosis not present

## 2020-11-16 ENCOUNTER — Ambulatory Visit (INDEPENDENT_AMBULATORY_CARE_PROVIDER_SITE_OTHER): Payer: Medicare Other | Admitting: Emergency Medicine

## 2020-11-16 ENCOUNTER — Ambulatory Visit (INDEPENDENT_AMBULATORY_CARE_PROVIDER_SITE_OTHER): Payer: Medicare Other

## 2020-11-16 ENCOUNTER — Other Ambulatory Visit: Payer: Self-pay

## 2020-11-16 ENCOUNTER — Encounter: Payer: Self-pay | Admitting: Emergency Medicine

## 2020-11-16 VITALS — BP 138/80 | HR 76 | Temp 99.3°F | Ht 74.0 in | Wt 313.6 lb

## 2020-11-16 DIAGNOSIS — N3000 Acute cystitis without hematuria: Secondary | ICD-10-CM | POA: Diagnosis not present

## 2020-11-16 DIAGNOSIS — M25531 Pain in right wrist: Secondary | ICD-10-CM | POA: Diagnosis not present

## 2020-11-16 DIAGNOSIS — I10 Essential (primary) hypertension: Secondary | ICD-10-CM | POA: Diagnosis not present

## 2020-11-16 DIAGNOSIS — E785 Hyperlipidemia, unspecified: Secondary | ICD-10-CM | POA: Diagnosis not present

## 2020-11-16 DIAGNOSIS — Z23 Encounter for immunization: Secondary | ICD-10-CM

## 2020-11-16 DIAGNOSIS — K5790 Diverticulosis of intestine, part unspecified, without perforation or abscess without bleeding: Secondary | ICD-10-CM | POA: Diagnosis not present

## 2020-11-16 DIAGNOSIS — M7989 Other specified soft tissue disorders: Secondary | ICD-10-CM | POA: Diagnosis not present

## 2020-11-16 DIAGNOSIS — R7303 Prediabetes: Secondary | ICD-10-CM | POA: Diagnosis not present

## 2020-11-16 DIAGNOSIS — M654 Radial styloid tenosynovitis [de Quervain]: Secondary | ICD-10-CM | POA: Insufficient documentation

## 2020-11-16 DIAGNOSIS — C61 Malignant neoplasm of prostate: Secondary | ICD-10-CM

## 2020-11-16 MED ORDER — DICLOFENAC SODIUM 75 MG PO TBEC: 75.0000 mg | DELAYED_RELEASE_TABLET | Freq: Two times a day (BID) | ORAL | 1 refills | Status: AC

## 2020-11-16 MED ORDER — LISINOPRIL-HYDROCHLOROTHIAZIDE 20-12.5 MG PO TABS: 1.0000 | ORAL_TABLET | Freq: Every day | ORAL | 3 refills | Status: DC

## 2020-11-16 NOTE — Assessment & Plan Note (Signed)
Elevated systolic readings in the office and at home. Will increase Zestoretic to 20-12.5 mg daily. Advised to continue monitoring blood pressure readings at home especially the next 7 days when he will be taking Voltaren 75 mg twice a day.

## 2020-11-16 NOTE — Patient Instructions (Signed)
De Quervain's Tenosynovitis  De Quervain's tenosynovitis is a condition that causes inflammation of the tendon on the thumb side of the wrist. Tendons are cords of tissue that connect bones to muscles. The tendons in the hand pass through a tunnel called a sheath. A slippery layer of tissue (synovium) lets the tendons move smoothly in the sheath. With de Quervain'stenosynovitis, the sheath swells or thickens, causing friction and pain. The condition is also called de Quervain's disease and de Quervain's syndrome.It occurs most often in women who are 66-59 years old. What are the causes? The exact cause of this condition is not known. It may be associated withoveruse of the hand and wrist. What increases the risk? You are more likely to develop this condition if you: Use your hands far more than normal, especially if you repeat certain movements that involve twisting your hand or using a tight grip. Are pregnant. Are a middle-aged woman. Have rheumatoid arthritis. Have diabetes. What are the signs or symptoms? The main symptom of this condition is pain on the thumb side of the wrist. The pain may get worse when you grasp something or turn your wrist. Other symptoms may include: Pain that extends up the forearm. Swelling of your wrist and hand. Trouble moving the thumb and wrist. A sensation of snapping in the wrist. A bump filled with fluid (cyst) in the area of the pain. How is this diagnosed? This condition may be diagnosed based on: Your symptoms and medical history. A physical exam. During the exam, your health care provider may do a simple test [place]lin test) that involves pulling your thumb and wrist to see if this causes pain. You may also need to have an X-ray or ultrasound. How is this treated? Treatment for this condition may include: Avoiding any activity that causes pain and swelling. Taking medicines. Anti-inflammatory medicines and corticosteroid injections may be used to  reduce inflammation and relieve pain. Wearing a splint. Having surgery. This may be needed if other treatments do not work. Once the pain and swelling have gone down, you may start: Physical therapy. This includes exercises to improve movement and strength in your wrist and thumb. Occupational therapy. This includes adjusting how you move your wrist. Follow these instructions at home: If you have a splint: Wear the splint as told by your health care provider. Remove it only as told by your health care provider. Loosen the splint if your fingers tingle, become numb, or turn cold and blue. Keep the splint clean. If the splint is not waterproof: Do not let it get wet. Cover it with a watertight covering when you take a bath or a shower. Managing pain, stiffness, and swelling  Avoid movements and activities that cause pain and swelling in the wrist area. If directed, put ice on the painful area. This may be helpful after doing activities that involve the sore wrist. To do this: Put ice in a plastic bag. Place a towel between your skin and the bag. Leave the ice on for 20 minutes, 2-3 times a day. Remove the ice if your skin turns bright red. This is very important. If you cannot feel pain, heat, or cold, you have a greater risk of damage to the area. Move your fingers often to reduce stiffness and swelling. Raise (elevate) the injured area above the level of your heart while you are sitting or lying down.  General instructions Return to your normal activities as told by your health care provider. Ask your health care  provider what activities are safe for you. Take over-the-counter and prescription medicines only as told by your health care provider. Keep all follow-up visits. This is important. Contact a health care provider if: Your pain medicine does not help. Your pain gets worse. You develop new symptoms. Summary De Quervain's tenosynovitis is a condition that causes inflammation of  the tendon on the thumb side of the wrist. The condition occurs most often in women who are 32-39 years old. The exact cause of this condition is not known. It may be associated with overuse of the hand and wrist. Treatment starts with avoiding activity that causes pain or swelling in the wrist area. Other treatments may include wearing a splint and taking medicine. Sometimes, surgery is needed. This information is not intended to replace advice given to you by your health care provider. Make sure you discuss any questions you have with your healthcare provider. Document Revised: 09/08/2019 Document Reviewed: 09/08/2019 Elsevier Patient Education  2022 [employer].

## 2020-11-16 NOTE — Assessment & Plan Note (Signed)
Advised to use wrist splint.  We will start Voltaren 75 mg twice a day and Tylenol as needed for breakthrough pain every 4-6 hours.

## 2020-11-16 NOTE — Progress Notes (Signed)
[name] [name] 70 y.o.   Chief Complaint  Patient presents with   Wrist Pain    Right for one month    HISTORY OF PRESENT ILLNESS: This is a 70 y.o. male complaining of right wrist pain that started 3 to 4 weeks ago without injury. Has history of hypertension on Zestoretic 10-12.5 mg daily. Also has history of prostate cancer.  Saw urologist this morning and was told that he has a urine infection.  Was started on antibiotics. No other complaints or medical concerns today.    Prior to Admission medications   Medication Sig Start Date End Date Taking? Authorizing Provider  ALPRAZolam Duanne Moron) 0.5 MG tablet Take 1 tablet (0.5 mg total) by mouth daily as needed for anxiety. 07/12/20  Yes Angeletta Goelz, Ines Bloomer, MD  multivitamin (ONE-A-DAY MEN'S) TABS tablet Take 1 tablet by mouth daily.   Yes [provider]  oxybutynin (DITROPAN) 5 MG tablet Take 5 mg by mouth at bedtime. 08/10/20  Yes [provider]  rosuvastatin (CRESTOR) 10 MG tablet TAKE 1 TABLET BY MOUTH EVERY DAY 09/19/19  Yes Haddy Mullinax, Ines Bloomer, MD  triamcinolone (NASACORT) 55 MCG/ACT AERO nasal inhaler PLACE 2 SPRAYS INTO THE NOSE DAILY. 06/04/20  Yes Jondavid Schreier, Ines Bloomer, MD  finasteride (PROSCAR) 5 MG tablet Take 5 mg by mouth daily. Patient not taking: Reported on 11/16/2020 01/24/20   [provider]  lisinopril-hydrochlorothiazide (ZESTORETIC) 10-12.5 MG tablet Take 1 tablet by mouth daily. 07/12/20 10/10/20  Horald Pollen, MD  tamsulosin (FLOMAX) 0.4 MG CAPS capsule Take 1 capsule (0.4 mg total) by mouth daily after supper. Patient not taking: Reported on 11/16/2020 09/24/19   Tyler Pita, MD    Allergies  Allergen Reactions   Aspirin     REACTION: upsets stomach   Statins     REACTION: UPSETS STOMACH   Gadolinium Derivatives Nausea And Vomiting    Pt was given 20 ml multihance and became nauseated and vomited several times.     Patient Active Problem List   Diagnosis Date Noted    Body mass index (BMI) of 38.0-38.9 in adult 01/10/2020   Diverticulosis 01/10/2020   Prediabetes 01/10/2020   Dyslipidemia 01/10/2020   Malignant neoplasm of prostate ([place]) 06/08/2019   Prostate enlargement 08/29/2017   Gout 10/25/2014   Hypertension    BPH (benign prostatic hypertrophy)    Kidney stones    Prostate nodule     Past Medical History:  Diagnosis Date   Allergy    seasonal allergies   Anxiety    at times   Arthritis    generalized   Blood transfusion without reported diagnosis 2013   when had diverticulitis flare   BPH (benign prostatic hypertrophy)    Colon polyp    diverticular bleed with transfusion   Diverticulosis    Hyperlipidemia    on meds   Hypertension    on meds   Kidney stones    hx of 15-20 kidney stones    Obesity, unspecified    Prostate cancer ([place]) 2020   dx 05/2019   Prostate nodule [id number]    Past Surgical History:  Procedure Laterality Date   ANKLE FUSION Left 1991   COLONOSCOPY  2011   DB-F/V-miralax(good)-TICS/3 HPP   HERNIA REPAIR N/A    umbilical    POLYPECTOMY  2011   3 HPP   TRANSURETHRAL RESECTION OF PROSTATE  2020    Social History   Socioeconomic History   Marital status: Married    Spouse name:  Not on file   Number of children: 2   Years of education: Not on file   Highest education level: Not on file  Occupational History   Not on file  Tobacco Use   Smoking status: Never   Smokeless tobacco: Never  Vaping Use   Vaping Use: Never used  Substance and Sexual Activity   Alcohol use: No   Drug use: No   Sexual activity: Yes  Other Topics Concern   Not on file  Social History Narrative   Not on file   Social Determinants of Health   Financial Resource Strain: Not on file  Food Insecurity: Not on file  Transportation Needs: Not on file  Physical Activity: Not on file  Stress: Not on file  Social Connections: Not on file  Intimate Partner Violence: Not on file    Family History  Problem  Relation Age of Onset   Diabetes Mother    Breast cancer Mother 55   Prostate cancer Father        patient did not have a relationship with his father but understands he had prostate ca   Colon polyps Father 24   Colon cancer Father 80   Diabetes Sister    Pancreatic cancer Neg Hx    Esophageal cancer Neg Hx    Stomach cancer Neg Hx    Rectal cancer Neg Hx      Review of Systems  Constitutional: Negative.  Negative for chills and fever.  HENT: Negative.  Negative for congestion and sore throat.   Respiratory: Negative.  Negative for cough and shortness of breath.   Cardiovascular: Negative.  Negative for chest pain and palpitations.  Gastrointestinal: Negative.  Negative for abdominal pain, diarrhea, nausea and vomiting.  Genitourinary: Negative.  Negative for dysuria and hematuria.  Musculoskeletal:  Positive for joint pain.  Skin: Negative.  Negative for rash.  Neurological:  Negative for dizziness and headaches.  All other systems reviewed and are negative. Vitals:   11/16/20 1538  BP: 140/72  Pulse: 76  Temp: 99.3 F (37.4 C)  SpO2: 98%     Physical Exam Vitals reviewed.  Constitutional:      Appearance: Normal appearance.  HENT:     Head: Normocephalic.  Eyes:     Extraocular Movements: Extraocular movements intact.     Pupils: Pupils are equal, round, and reactive to light.  Cardiovascular:     Rate and Rhythm: Normal rate and regular rhythm.     Pulses: Normal pulses.     Heart sounds: Normal heart sounds.  Pulmonary:     Effort: Pulmonary effort is normal.     Breath sounds: Normal breath sounds.  Musculoskeletal:     Cervical back: Normal range of motion.     Comments: Right wrist: Positive tenderness to radial side.  Positive Finkelstein test.  Skin:    General: Skin is warm and dry.     Capillary Refill: Capillary refill takes less than 2 seconds.  Neurological:     General: No focal deficit present.     Mental Status: He is alert and oriented to  person, place, and time.  Psychiatric:        Mood and Affect: Mood normal.        Behavior: Behavior normal.   DG Wrist Complete Right  Result Date: 11/16/2020 CLINICAL DATA:  Right wrist pain and swelling for 1 month. EXAM: RIGHT WRIST - COMPLETE 3+ VIEW COMPARISON:  None. FINDINGS: There is no evidence of fracture  or dislocation. Tiny ossicle just off the radial styloid is likely an accessory ossicle. There is no evidence of arthropathy or other focal bone abnormality. Soft tissues are unremarkable. IMPRESSION: No acute abnormality or finding to explain patient's symptoms. Electronically Signed   By: Inge Rise M.D.   On: 11/16/2020 16:48     ASSESSMENT & PLAN: A total of 30 minutes was spent with the patient and counseling/coordination of care regarding diagnosis of tenosynovitis and management including immobilization and use of NSAID which he has taken in the past without any reaction, pain management, hypertension and need to monitor blood pressure readings at home and expect a slight increase with the NSAID use, review of all medications and changes made including increasing dose of Zestoretic to 20-12.5 mg daily, review of most recent office visit note, review of wrist x-ray done today, prognosis, documentation and need for follow-up.  Hypertension Elevated systolic readings in the office and at home. Will increase Zestoretic to 20-12.5 mg daily. Advised to continue monitoring blood pressure readings at home especially the next 7 days when he will be taking Voltaren 75 mg twice a day.  Tenosynovitis, de Quervain Advised to use wrist splint.  We will start Voltaren 75 mg twice a day and Tylenol as needed for breakthrough pain every 4-6 hours. Mohd was seen today for wrist pain.  Diagnoses and all orders for this visit:  Right wrist pain -     DG Wrist Complete Right  Tenosynovitis, de Quervain -     diclofenac (VOLTAREN) 75 MG EC tablet; Take 1 tablet (75 mg total) by  mouth 2 (two) times daily for 7 days.  Need for shingles vaccine -     Varicella-zoster vaccine IM  Essential hypertension -     lisinopril-hydrochlorothiazide (ZESTORETIC) 20-12.5 MG tablet; Take 1 tablet by mouth daily. -     AMB Referral to [facility] Coordinaton  Diverticulosis  Malignant neoplasm of prostate ([place]) -     AMB Referral to [facility]  Prediabetes -     AMB Referral to [facility] Coordinaton  Dyslipidemia -     AMB Referral to [place]  Patient Instructions  Tennis Must Quervain's Tenosynovitis  De Quervain's tenosynovitis is a condition that causes inflammation of the tendon on the thumb side of the wrist. Tendons are cords of tissue that connect bones to muscles. The tendons in the hand pass through a tunnel called a sheath. A slippery layer of tissue (synovium) lets the tendons move smoothly in the sheath. With de Quervain'stenosynovitis, the sheath swells or thickens, causing friction and pain. The condition is also called de Quervain's disease and de Quervain's syndrome.It occurs most often in women who are 56-5 years old. What are the causes? The exact cause of this condition is not known. It may be associated withoveruse of the hand and wrist. What increases the risk? You are more likely to develop this condition if you: Use your hands far more than normal, especially if you repeat certain movements that involve twisting your hand or using a tight grip. Are pregnant. Are a middle-aged woman. Have rheumatoid arthritis. Have diabetes. What are the signs or symptoms? The main symptom of this condition is pain on the thumb side of the wrist. The pain may get worse when you grasp something or turn your wrist. Other symptoms may include: Pain that extends up the forearm. Swelling of your wrist and hand. Trouble moving the thumb and wrist. A sensation of snapping in  the wrist. A bump filled with fluid (cyst) in the area of the  pain. How is this diagnosed? This condition may be diagnosed based on: Your symptoms and medical history. A physical exam. During the exam, your health care provider may do a simple test Wynn Maudlin test) that involves pulling your thumb and wrist to see if this causes pain. You may also need to have an X-ray or ultrasound. How is this treated? Treatment for this condition may include: Avoiding any activity that causes pain and swelling. Taking medicines. Anti-inflammatory medicines and corticosteroid injections may be used to reduce inflammation and relieve pain. Wearing a splint. Having surgery. This may be needed if other treatments do not work. Once the pain and swelling have gone down, you may start: Physical therapy. This includes exercises to improve movement and strength in your wrist and thumb. Occupational therapy. This includes adjusting how you move your wrist. Follow these instructions at home: If you have a splint: Wear the splint as told by your health care provider. Remove it only as told by your health care provider. Loosen the splint if your fingers tingle, become numb, or turn cold and blue. Keep the splint clean. If the splint is not waterproof: Do not let it get wet. Cover it with a watertight covering when you take a bath or a shower. Managing pain, stiffness, and swelling  Avoid movements and activities that cause pain and swelling in the wrist area. If directed, put ice on the painful area. This may be helpful after doing activities that involve the sore wrist. To do this: Put ice in a plastic bag. Place a towel between your skin and the bag. Leave the ice on for 20 minutes, 2-3 times a day. Remove the ice if your skin turns bright red. This is very important. If you cannot feel pain, heat, or cold, you have a greater risk of damage to the area. Move your fingers often to reduce stiffness and swelling. Raise (elevate) the injured area above the level of your  heart while you are sitting or lying down.  General instructions Return to your normal activities as told by your health care provider. Ask your health care provider what activities are safe for you. Take over-the-counter and prescription medicines only as told by your health care provider. Keep all follow-up visits. This is important. Contact a health care provider if: Your pain medicine does not help. Your pain gets worse. You develop new symptoms. Summary De Quervain's tenosynovitis is a condition that causes inflammation of the tendon on the thumb side of the wrist. The condition occurs most often in women who are 3-89 years old. The exact cause of this condition is not known. It may be associated with overuse of the hand and wrist. Treatment starts with avoiding activity that causes pain or swelling in the wrist area. Other treatments may include wearing a splint and taking medicine. Sometimes, surgery is needed. This information is not intended to replace advice given to you by your health care provider. Make sure you discuss any questions you have with your healthcare provider. Document Revised: 09/08/2019 Document Reviewed: 09/08/2019 Elsevier Patient Education  2022 [place], MD [facility]

## 2020-11-27 ENCOUNTER — Telehealth: Payer: Self-pay | Admitting: *Deleted

## 2020-11-27 NOTE — Chronic Care Management (AMB) (Signed)
  Chronic Care Management   Outreach Note  11/27/2020 Name: [name] [name] MRN: [id number] DOB: [birth year]  [name] [name] is a 70 y.o. year old male who is a primary care patient of Sagardia, Ines Bloomer, MD. I reached out to Sharlette Dense by phone today in response to a referral sent by Mr. [name] [name], PCP Ines Bloomer, MD     An unsuccessful telephone outreach was attempted today. The patient was referred to the case management team for assistance with care management and care coordination.   Follow Up Plan: If patient returns call to provider office, please advise to call Embedded Care Management Care Guide Nalla Purdy at [place], [place] [facility]  Direct Dial: [phone]

## 2020-11-30 NOTE — Chronic Care Management (AMB) (Signed)
  Chronic Care Management   Note  11/30/2020 Name: [name] [name] MRN: [id number] DOB: June 27, 1950  [name] [name] is a 70 y.o. year old male who is a primary care patient of Sagardia, Ines Bloomer, MD. I reached out to Sharlette Dense by phone today in response to a referral sent by [name] [name]'s PCP Horald Pollen, MD     [name] [name] was given information about Chronic Care Management services today including:  CCM service includes personalized support from designated clinical staff supervised by his physician, including individualized plan of care and coordination with other care providers 24/7 contact phone numbers for assistance for urgent and routine care needs. Service will only be billed when office clinical staff spend 20 minutes or more in a month to coordinate care. Only one practitioner may furnish and bill the service in a calendar month. The patient may stop CCM services at any time (effective at the end of the month) by phone call to the office staff. The patient will be responsible for cost sharing (co-pay) of up to 20% of the service fee (after annual deductible is met).  Patient agreed to services and verbal consent obtained.   Follow up plan: Telephone appointment with care management team member scheduled for:12/07/2020  Julian Hy, [place] [facility]  Direct Dial: [phone]

## 2020-12-07 ENCOUNTER — Ambulatory Visit (INDEPENDENT_AMBULATORY_CARE_PROVIDER_SITE_OTHER): Payer: Medicare Other | Admitting: *Deleted

## 2020-12-07 DIAGNOSIS — E785 Hyperlipidemia, unspecified: Secondary | ICD-10-CM | POA: Diagnosis not present

## 2020-12-07 DIAGNOSIS — I10 Essential (primary) hypertension: Secondary | ICD-10-CM | POA: Diagnosis not present

## 2020-12-07 NOTE — Chronic Care Management (AMB) (Signed)
Chronic Care Management   CCM RN Visit Note  12/07/2020 Name: [name] [name] MRN: [id number] DOB: 1951/02/18  Subjective: [name] [name] is a 70 y.o. year old male who is a primary care patient of Sagardia, Ines Bloomer, MD. The care management team was consulted for assistance with disease management and care coordination needs.    Engaged with patient by telephone for initial visit in response to provider referral for case management and/or care coordination services.   Consent to Services:  The patient was given information about Chronic Care Management services, agreed to services, and gave verbal consent 11/30/20 prior to initiation of services.  Please see initial visit note for detailed documentation.  Patient agreed to services and verbal consent obtained.   Assessment: Review of patient past medical history, allergies, medications, health status, including review of consultants reports, laboratory and other test data, was performed as part of comprehensive evaluation and provision of chronic care management services.   SDOH (Social Determinants of Health) assessments and interventions performed:  SDOH Interventions    Flowsheet Row Most Recent Value  SDOH Interventions   Food Insecurity Interventions Intervention Not Indicated  Financial Strain Interventions Intervention Not Indicated  Housing Interventions Intervention Not Indicated  Transportation Interventions Intervention Not Indicated       CCM Care Plan  Allergies  Allergen Reactions   Aspirin     REACTION: upsets stomach   Statins     REACTION: UPSETS STOMACH   Gadolinium Derivatives Nausea And Vomiting    Pt was given 20 ml multihance and became nauseated and vomited several times.     Outpatient Encounter Medications as of 12/07/2020  Medication Sig Note   ALPRAZolam (XANAX) 0.5 MG tablet Take 1 tablet (0.5 mg total) by mouth daily as needed for anxiety.    finasteride (PROSCAR) 5 MG tablet Take 5 mg by  mouth daily.    lisinopril-hydrochlorothiazide (ZESTORETIC) 20-12.5 MG tablet Take 1 tablet by mouth daily. 12/07/2020: 12/07/20: Patient reports he has not yet heard from pharmacy from 11/16/20 PCP dosage increase: reports currently still taking 10-12.5 mg QD: care coordination outreach placed to pharmacy- see narrative in care plan for details   multivitamin (ONE-A-DAY MEN'S) TABS tablet Take 1 tablet by mouth daily.    oxybutynin (DITROPAN) 5 MG tablet Take 5 mg by mouth at bedtime.    rosuvastatin (CRESTOR) 10 MG tablet TAKE 1 TABLET BY MOUTH EVERY DAY    triamcinolone (NASACORT) 55 MCG/ACT AERO nasal inhaler PLACE 2 SPRAYS INTO THE NOSE DAILY.    tamsulosin (FLOMAX) 0.4 MG CAPS capsule Take 1 capsule (0.4 mg total) by mouth daily after supper. (Patient not taking: No sig reported)    No facility-administered encounter medications on file as of 12/07/2020.    Patient Active Problem List   Diagnosis Date Noted   Tenosynovitis, Alfonse Ras 11/16/2020   Body mass index (BMI) of 38.0-38.9 in adult 01/10/2020   Diverticulosis 01/10/2020   Prediabetes 01/10/2020   Dyslipidemia 01/10/2020   Malignant neoplasm of prostate ([place]) 06/08/2019   Prostate enlargement 08/29/2017   Gout 10/25/2014   Hypertension    BPH (benign prostatic hypertrophy)    Kidney stones    Prostate nodule     Conditions to be addressed/monitored: HTN and HLD  Care Plan : Hypertension (Adult)  Updates made by Knox Royalty, RN since 12/07/2020 12:00 AM     Problem: Hypertension (Hypertension)   Priority: Medium     Long-Range Goal: Hypertension Monitored   Start Date: 12/07/2020  Expected End Date: 12/07/2021  This Visit's Progress: On track  Priority: Medium  Note:   Objective:  Last practice recorded BP readings:  BP Readings from Last 3 Encounters:  11/16/20 138/80  07/12/20 133/77  04/24/20 120/80   Most recent eGFR/CrCl: No results found for: EGFR  No components found for: CRCL Current Barriers:   Knowledge Deficit related to basic understanding of hypertension pathophysiology and self care management No current self-care deficits identified Case Manager Clinical Goal(s):  Over the next 12 months patient will demonstrate improved adherence to prescribed treatment plan for hypertension as evidenced by patient reporting during CCM RN CM outreach of: Taking all medications as prescribed Monitoring and recording blood pressure twice a week Improved adherence to low sodium/DASH diet Increased activity Interventions:  Collaboration with Horald Pollen, MD regarding development and update of comprehensive plan of care as evidenced by provider attestation and co-signature Inter-disciplinary care team collaboration (see longitudinal plan of care) Evaluation of current treatment plan related to hypertension self management and patient's adherence to plan as established by provider; chart reviewed including relevant office notes, upcoming scheduled appointments, and lab results Discussed current  clinical condition with patient and confirmed no current clinical or medication concerns; confirmed patient continues to manage medications independently at home and reports adherence to medication regimen Medication review completed with patient today: he reports that outpatient pharmacy has not yet contacted him post 11/16/20 PCP office visit to provide increased dose of HTN medication: care Coordination outreach completed with patient's outpatient pharmacy to clarify: confirmed that outpatient pharmacy will re-fill new dose of lisinopril 20-12.5 and this will be ready for pickup later today; advised patient to begin taking new dose of medication once he obtains from outpatient pharmacy Reviewed last PCP office visit 11/16/20 with patient Confirmed patient currently not monitoring blood pressure at home: encouraged patient to begin monitoring/ recording blood pressures at home twice weekly, once during  rest/ once during or after activity Discussed basic pathophysiology of HTN/ HLD along with risks/ complications of uncontrolled HTN, HLD- patient will require ongoing reinforcement of same Discussed signs/ symptoms low- and high blood pressure, given his lisinopril dose has been increased; encouraged patient to contact care team for any concerns after he starts new dose of lisinopril Provided education to patient re: stroke prevention, s/s of heart attack and stroke, DASH diet, complications of uncontrolled blood pressure, benefits of increased activity, weight loss: patient tries to adhere to low sodium diet and would like additional educational around same- will place in mail to patient, as requested; discussed basic simple strategies at home to begin incorporating lifestyle changes in his daily routine Provided verbal information/ education around advanced directive planning- will provide printed educational material in mail Reviewed upcoming provider appointments with patient and confirmed that patient has plans to attend all as scheduled Discussed plans with patient for ongoing care management follow up and provided patient with direct contact information for care management team Self-Care Activities: Self administers medications as prescribed Attends all scheduled provider appointments Calls provider office for new concerns, questions, or BP outside discussed parameters Tries to follow a low sodium diet/DASH diet Patient Goals: Check your blood pressures 2 times per week- once when you are relaxed and then again after activity: we will review your blood pressures at home during each of our phone call appointments Write your blood pressure results down on paper, in a log or diary so you don't forget what they are Read over the attached information and start thinking  of ways you can begin to decrease the salt in your diet and get more activity Always take your medications as they are  prescribed Follow Up Plan:  Telephone follow up appointment with care management team member scheduled for:  Friday, January 05, 2021 at 9:15 am The patient has been provided with contact information for the care management team and has been advised to call with any health related questions or concerns.      Plan: Telephone follow up appointment with care management team member scheduled for:  Friday, January 05, 2021 at 9:15 am The patient has been provided with contact information for the care management team and has been advised to call with any health related questions or concerns.   [name], RN, BSN, [facility] RN Care Coordination- [place] [phone]: direct office [phone]: mobile

## 2020-12-07 NOTE — Patient Instructions (Signed)
Visit Information   [name] [name], it was nice talking with you today.   Please read over the attached information, and start now to make small changes at home to increase your activity and decrease the amount of salt in your diet   I look forward to talking to you again for an update on Friday, January 05, 2021 at 9:15 am- please be listening out for my call that day.  I will call as close to 9:15 am as possible; I look forward to hearing about your progress.   Please don't hesitate to contact me if I can be of assistance to you before our next scheduled appointment.   PATIENT GOALS:   Goals Addressed             This Visit's Progress    Track and Manage My Blood Pressure-Hypertension   On track    Timeframe:  Long-Range Goal Priority:  Medium Start Date:      12/07/20                       Expected End Date:    12/07/21                   Follow Up Date 01/05/2021    Check your blood pressures 2 times per week- once when you are relaxed and then again after activity: we will review your blood pressures at home during each of our phone call appointments Write your blood pressure results down on paper, in a log or diary so you don't forget what they are Read over the attached information and start thinking of ways you can begin to decrease the salt in your diet and get more activity Always take your medications as they are prescribed   Why is this important?   You won't feel high blood pressure, but it can still hurt your blood vessels.  High blood pressure can cause heart or kidney problems. It can also cause a stroke.  Making lifestyle changes like losing a little weight or eating less salt will help.  Checking your blood pressure at home and at different times of the day can help to control blood pressure.  If the doctor prescribes medicine remember to take it the way the doctor ordered.  Call the office if you cannot afford the medicine or if there are questions about it.              Critical care medicine: Principles of diagnosis and management in the adult (4th ed., pp. 4193-7902). [place]unders."> Miller's anesthesia (8th ed., pp. 232-250). [place]unders.">  Advance Directive  Advance directives are legal documents that allow you to make decisions about your health care and medical treatment in case you become unable to communicate for yourself. Advance directives let your wishes be known to family, friends,and health care providers. Discussing and writing advance directives should happen over time rather than all at once. Advance directives can be changed and updated at any time. There are different types of advance directives, such as: Medical power of attorney. Living will. Do not resuscitate (DNR) order or do not attempt resuscitation (DNAR) order. Health care proxy and medical power of attorney A health care proxy is also called a health care agent. This person is appointed to make medical decisions for you when you are unable to make decisions for yourself. Generally, people ask a trusted friend or family member to act as their proxy and represent their preferences. Make sure you  have an agreement with your trusted person to act as your proxy. A proxy may have tomake a medical decision on your behalf if your wishes are not known. A medical power of attorney, also called a durable power of attorney for health care, is a legal document that names your health care proxy. Depending on the laws in your state, the document may need to be: Signed. Notarized. Dated. Copied. Witnessed. Incorporated into your medical record. You may also want to appoint a trusted person to manage your money in the event you are unable to do so. This is called a durable power of attorney for finances. It is a separate legal document from the durable power of attorney for health care. You may choose your health care proxy or someone different toact as your agent in money matters. If you do not appoint a  proxy, or there is a concern that the proxy is not acting in your best interest, a court may appoint a guardian to act on yourbehalf. Living will A living will is a set of instructions that state your wishes about medical care when you cannot express them yourself. Health care providers should keep a copy of your living will in your medical record. You may want to give a copy to family members or friends. To alert caregivers in case of an emergency, you can place a card in your wallet to let them know that you have a living will and where they can find it. A living will is used if you become: Terminally ill. Disabled. Unable to communicate or make decisions. The following decisions should be included in your living will: To use or not to use life support equipment, such as dialysis machines and breathing machines (ventilators). Whether you want a DNR or DNAR order. This tells health care providers not to use cardiopulmonary resuscitation (CPR) if breathing or heartbeat stops. To use or not to use tube feeding. To be given or not to be given food and fluids. Whether you want comfort (palliative) care when the goal becomes comfort rather than a cure. Whether you want to donate your organs and tissues. A living will does not give instructions for distributing your money andproperty if you should pass away. DNR or DNAR A DNR or DNAR order is a request not to have CPR in the event that your heart stops beating or you stop breathing. If a DNR or DNAR order has not been made and shared, a health care provider will try to help any patient whose heart has stopped or who has stopped breathing. If you plan to have surgery, talk with your health care provider about how your DNR or DNAR order will be followed ifproblems occur. What if I do not have an advance directive? Some states assign family decision makers to act on your behalf if you do not have an advance directive. Each state has its own laws about  advance directives. You may want to check with your health care provider, attorney, orstate representative about the laws in your state. Summary Advance directives are legal documents that allow you to make decisions about your health care and medical treatment in case you become unable to communicate for yourself. The process of discussing and writing advance directives should happen over time. You can change and update advance directives at any time. Advance directives may include a medical power of attorney, a living will, and a DNR or DNAR order. This information is not intended to replace advice given  to you by your health care provider. Make sure you discuss any questions you have with your healthcare provider. Document Revised: 02/29/2020 Document Reviewed: 02/29/2020 Elsevier Patient Education  [place].  Low-Sodium Eating Plan Sodium, which is an element that makes up salt, helps you maintain a healthy balance of fluids in your body. Too much sodium can increase your bloodpressure and cause fluid and waste to be held in your body. Your health care provider or dietitian may recommend following this plan if you have high blood pressure (hypertension), kidney disease, liver disease, or heart failure. Eating less sodium can help lower your blood pressure, reduce swelling, and protect your heart, liver, andkidneys. What are tips for following this plan? Reading food labels The Nutrition Facts label lists the amount of sodium in one serving of the food. If you eat more than one serving, you must multiply the listed amount of sodium by the number of servings. Choose foods with less than 140 mg of sodium per serving. Avoid foods with 300 mg of sodium or more per serving. Shopping  Look for lower-sodium products, often labeled as "low-sodium" or "no salt added." Always check the sodium content, even if foods are labeled as "unsalted" or "no salt added." Buy fresh foods. Avoid canned  foods and pre-made or frozen meals. Avoid canned, cured, or processed meats. Buy breads that have less than 80 mg of sodium per slice.  Cooking  Eat more home-cooked food and less restaurant, buffet, and fast food. Avoid adding salt when cooking. Use salt-free seasonings or herbs instead of table salt or sea salt. Check with your health care provider or pharmacist before using salt substitutes. Cook with plant-based oils, such as canola, sunflower, or olive oil.  Meal planning When eating at a restaurant, ask that your food be prepared with less salt or no salt, if possible. Avoid dishes labeled as brined, pickled, cured, smoked, or made with soy sauce, miso, or teriyaki sauce. Avoid foods that contain MSG (monosodium glutamate). MSG is sometimes added to [place] food, bouillon, and some canned foods. Make meals that can be grilled, baked, poached, roasted, or steamed. These are generally made with less sodium. General information Most people on this plan should limit their sodium intake to 1,500-2,000 mg (milligrams) of sodium each day. What foods should I eat? Fruits Fresh, frozen, or canned fruit. Fruit juice. Vegetables Fresh or frozen vegetables. "No salt added" canned vegetables. "No salt added"tomato sauce and paste. Low-sodium or reduced-sodium tomato and vegetable juice. Grains Low-sodium cereals, including oats, puffed wheat and rice, and shredded wheat. Low-sodium crackers. Unsalted rice. Unsalted pasta. Low-sodium bread.Whole-grain breads and whole-grain pasta. Meats and other proteins Fresh or frozen (no salt added) meat, poultry, seafood, and fish. Low-sodium canned tuna and salmon. Unsalted nuts. Dried peas, beans, and lentils withoutadded salt. Unsalted canned beans. Eggs. Unsalted nut butters. Dairy Milk. Soy milk. Cheese that is naturally low in sodium, such as ricotta cheese, fresh mozzarella, or Swiss cheese. Low-sodium or reduced-sodium cheese. Creamcheese.  Yogurt. Seasonings and condiments Fresh and dried herbs and spices. Salt-free seasonings. Low-sodium mustard and ketchup. Sodium-free salad dressing. Sodium-free light mayonnaise. Fresh orrefrigerated horseradish. Lemon juice. Vinegar. Other foods Homemade, reduced-sodium, or low-sodium soups. Unsalted popcorn and pretzels.Low-salt or salt-free chips. The items listed above may not be a complete list of foods and beverages you can eat. Contact a dietitian for more information. What foods should I avoid? Vegetables Sauerkraut, pickled vegetables, and relishes. Olives. [place] fries. Onion rings. Regular canned vegetables (not  low-sodium or reduced-sodium). Regular canned tomato sauce and paste (not low-sodium or reduced-sodium). Regular tomato and vegetable juice (not low-sodium or reduced-sodium). Frozenvegetables in sauces. Grains Instant hot cereals. Bread stuffing, pancake, and biscuit mixes. Croutons. Seasoned rice or pasta mixes. Noodle soup cups. Boxed or frozen macaroni andcheese. Regular salted crackers. Self-rising flour. Meats and other proteins Meat or fish that is salted, canned, smoked, spiced, or pickled. Precooked or cured meat, such as sausages or meat loaves. Berniece Salines. Ham. Pepperoni. Hot dogs. Corned beef. Chipped beef. Salt pork. Jerky. Pickled herring. Anchovies andsardines. Regular canned tuna. Salted nuts. Dairy Processed cheese and cheese spreads. Hard cheeses. Cheese curds. Blue cheese.Feta cheese. String cheese. Regular cottage cheese. Buttermilk. Canned milk. Fats and oils Salted butter. Regular margarine. Ghee. Bacon fat. Seasonings and condiments Onion salt, garlic salt, seasoned salt, table salt, and sea salt. Canned and packaged gravies. Worcestershire sauce. Tartar sauce. Barbecue sauce. Teriyaki sauce. Soy sauce, including reduced-sodium. Steak sauce. Fish sauce. Oyster sauce. Cocktail sauce. Horseradish that you find on the shelf. Regular ketchup and mustard. Meat  flavorings and tenderizers. Bouillon cubes. Hot sauce. Pre-made or packaged marinades. Pre-made or packaged taco seasonings. Relishes.Regular salad dressings. Salsa. Other foods Salted popcorn and pretzels. Corn chips and puffs. Potato and tortilla chips.Canned or dried soups. Pizza. Frozen entrees and pot pies. The items listed above may not be a complete list of foods and beverages you should avoid. Contact a dietitian for more information. Summary Eating less sodium can help lower your blood pressure, reduce swelling, and protect your heart, liver, and kidneys. Most people on this plan should limit their sodium intake to 1,500-2,000 mg (milligrams) of sodium each day. Canned, boxed, and frozen foods are high in sodium. Restaurant foods, fast foods, and pizza are also very high in sodium. You also get sodium by adding salt to food. Try to cook at home, eat more fresh fruits and vegetables, and eat less fast food and canned, processed, or prepared foods. This information is not intended to replace advice given to you by your health care provider. Make sure you discuss any questions you have with your healthcare provider. Document Revised: 07/02/2019 Document Reviewed: 04/28/2019 Elsevier Patient Education  2022 [employer].   Consent to CCM Services: Mr. Cuffee was given information about Chronic Care Management services 11/30/20 including:  CCM service includes personalized support from designated clinical staff supervised by his physician, including individualized plan of care and coordination with other care providers 24/7 contact phone numbers for assistance for urgent and routine care needs. Service will only be billed when office clinical staff spend 20 minutes or more in a month to coordinate care. Only one practitioner may furnish and bill the service in a calendar month. The patient may stop CCM services at any time (effective at the end of the month) by phone call to the office  staff. The patient will be responsible for cost sharing (co-pay) of up to 20% of the service fee (after annual deductible is met).  Patient agreed to services and verbal consent obtained.   The patient verbalized understanding of instructions, educational materials, and care plan provided today and agreed to receive a mailed copy of patient instructions, educational materials, and care plan.  Telephone follow up appointment with care management team member scheduled for:  Friday, January 05, 2021 at 9:15 am The patient has been provided with contact information for the care management team and has been advised to call with any health related questions or concerns.   Richarda Osmond  Jamse Arn, RN, BSN, [facility] RN Care Coordination- [place] [phone]: direct office [phone]: mobile   CLINICAL CARE PLAN: Patient Care Plan: Hypertension (Adult)     Problem Identified: Hypertension (Hypertension)   Priority: Medium     Long-Range Goal: Hypertension Monitored   Start Date: 12/07/2020  Expected End Date: 12/07/2021  This Visit's Progress: On track  Priority: Medium  Note:   Objective:  Last practice recorded BP readings:  BP Readings from Last 3 Encounters:  11/16/20 138/80  07/12/20 133/77  04/24/20 120/80   Most recent eGFR/CrCl: No results found for: EGFR  No components found for: CRCL Current Barriers:  Knowledge Deficit related to basic understanding of hypertension pathophysiology and self care management No current self-care deficits identified Case Manager Clinical Goal(s):  Over the next 12 months patient will demonstrate improved adherence to prescribed treatment plan for hypertension as evidenced by patient reporting during CCM RN CM outreach of: Taking all medications as prescribed Monitoring and recording blood pressure twice a week Improved adherence to low sodium/DASH diet Increased activity Interventions:  Collaboration with Horald Pollen, MD regarding development and update of comprehensive plan of care as evidenced by provider attestation and co-signature Inter-disciplinary care team collaboration (see longitudinal plan of care) Evaluation of current treatment plan related to hypertension self management and patient's adherence to plan as established by provider; chart reviewed including relevant office notes, upcoming scheduled appointments, and lab results Discussed current  clinical condition with patient and confirmed no current clinical or medication concerns; confirmed patient continues to manage medications independently at home and reports adherence to medication regimen Medication review completed with patient today: he reports that outpatient pharmacy has not yet contacted him post 11/16/20 PCP office visit to provide increased dose of HTN medication: care Coordination outreach completed with patient's outpatient pharmacy to clarify: confirmed that outpatient pharmacy will re-fill new dose of lisinopril 20-12.5 and this will be ready for pickup later today; advised patient to begin taking new dose of medication once he obtains from outpatient pharmacy Reviewed last PCP office visit 11/16/20 with patient Confirmed patient currently not monitoring blood pressure at home: encouraged patient to begin monitoring/ recording blood pressures at home twice weekly, once during rest/ once during or after activity Discussed basic pathophysiology of HTN/ HLD along with risks/ complications of uncontrolled HTN, HLD- patient will require ongoing reinforcement of same Discussed signs/ symptoms low- and high blood pressure, given his lisinopril dose has been increased; encouraged patient to contact care team for any concerns after he starts new dose of lisinopril Provided education to patient re: stroke prevention, s/s of heart attack and stroke, DASH diet, complications of uncontrolled blood pressure, benefits of increased activity,  weight loss: patient tries to adhere to low sodium diet and would like additional educational around same- will place in mail to patient, as requested; discussed basic simple strategies at home to begin incorporating lifestyle changes in his daily routine Provided verbal information/ education around advanced directive planning- will provide printed educational material in mail Reviewed upcoming provider appointments with patient and confirmed that patient has plans to attend all as scheduled Discussed plans with patient for ongoing care management follow up and provided patient with direct contact information for care management team Self-Care Activities: Self administers medications as prescribed Attends all scheduled provider appointments Calls provider office for new concerns, questions, or BP outside discussed parameters Tries to follow a low sodium diet/DASH diet Patient Goals: Check your blood pressures  2 times per week- once when you are relaxed and then again after activity: we will review your blood pressures at home during each of our phone call appointments Write your blood pressure results down on paper, in a log or diary so you don't forget what they are Read over the attached information and start thinking of ways you can begin to decrease the salt in your diet and get more activity Always take your medications as they are prescribed Follow Up Plan:  Telephone follow up appointment with care management team member scheduled for:  Friday, January 05, 2021 at 9:15 am The patient has been provided with contact information for the care management team and has been advised to call with any health related questions or concerns.

## 2020-12-25 DIAGNOSIS — M170 Bilateral primary osteoarthritis of knee: Secondary | ICD-10-CM | POA: Diagnosis not present

## 2021-01-01 DIAGNOSIS — M170 Bilateral primary osteoarthritis of knee: Secondary | ICD-10-CM | POA: Diagnosis not present

## 2021-01-05 ENCOUNTER — Telehealth: Payer: Medicare Other

## 2021-01-05 ENCOUNTER — Telehealth: Payer: Self-pay | Admitting: *Deleted

## 2021-01-05 ENCOUNTER — Encounter: Payer: Self-pay | Admitting: *Deleted

## 2021-01-05 NOTE — Telephone Encounter (Signed)
  Chronic Care Management   Follow Up Note   01/05/2021 Name: [name] [name] MRN: [id number] DOB: Apr 11, 1951  Referred by: Horald Pollen, MD Reason for referral : Chronic Care Management (CCM RN CM Follow Up telephone visit Unsuccessful attempt; HTN, HLD)  An unsuccessful telephone outreach was attempted today. The patient was referred to the case management team for assistance with care management and care coordination.   Follow Up Plan:  A HIPPA compliant phone message was left for the patient providing contact information and requesting a return call Will place request for scheduling care guide to contact patient to re-schedule today's missed telephone appointment  [name], RN, BSN, [place] [phone]: direct office [phone]: mobile

## 2021-01-08 ENCOUNTER — Telehealth: Payer: Self-pay | Admitting: *Deleted

## 2021-01-08 DIAGNOSIS — M170 Bilateral primary osteoarthritis of knee: Secondary | ICD-10-CM | POA: Diagnosis not present

## 2021-01-08 NOTE — Chronic Care Management (AMB) (Signed)
  Care Management   Note  01/08/2021 Name: [name] [name] MRN: [id number] DOB: [birth year]  [name] [name] is a 70 y.o. year old male who is a primary care patient of Horald Pollen, MD and is actively engaged with the care management team. I reached out to Sharlette Dense by phone today to assist with re-scheduling a follow up visit with the RN Case Manager  Follow up plan: Unsuccessful telephone outreach attempt made. A HIPAA compliant phone message was left for the patient providing contact information and requesting a return call.   Julian Hy, [place] [facility]  Direct Dial: [phone]

## 2021-01-09 ENCOUNTER — Telehealth (INDEPENDENT_AMBULATORY_CARE_PROVIDER_SITE_OTHER): Payer: Medicare Other | Admitting: Family Medicine

## 2021-01-09 ENCOUNTER — Encounter: Payer: Self-pay | Admitting: Family Medicine

## 2021-01-09 ENCOUNTER — Other Ambulatory Visit: Payer: Self-pay

## 2021-01-09 ENCOUNTER — Ambulatory Visit
Admission: EM | Admit: 2021-01-09 | Discharge: 2021-01-09 | Disposition: A | Discharge status: Home / Self Care | Payer: Medicare Other | Attending: Urgent Care | Admitting: Urgent Care

## 2021-01-09 VITALS — Temp 100.4°F

## 2021-01-09 DIAGNOSIS — R8290 Unspecified abnormal findings in urine: Secondary | ICD-10-CM | POA: Diagnosis not present

## 2021-01-09 DIAGNOSIS — R300 Dysuria: Secondary | ICD-10-CM

## 2021-01-09 DIAGNOSIS — R509 Fever, unspecified: Secondary | ICD-10-CM | POA: Diagnosis not present

## 2021-01-09 DIAGNOSIS — N3001 Acute cystitis with hematuria: Secondary | ICD-10-CM | POA: Diagnosis not present

## 2021-01-09 DIAGNOSIS — R519 Headache, unspecified: Secondary | ICD-10-CM

## 2021-01-09 LAB — POCT URINALYSIS DIP (MANUAL ENTRY)
Bilirubin, UA: NEGATIVE
Glucose, UA: NEGATIVE mg/dL
Nitrite, UA: POSITIVE — AB
Protein Ur, POC: 100 mg/dL — AB
Spec Grav, UA: 1.025 (ref 1.010–1.025)
Urobilinogen, UA: 0.2 E.U./dL
pH, UA: 5.5 (ref 5.0–8.0)

## 2021-01-09 MED ORDER — CEPHALEXIN 500 MG PO CAPS: 500.0000 mg | ORAL_CAPSULE | Freq: Two times a day (BID) | ORAL | 0 refills | Status: DC

## 2021-01-09 NOTE — Discharge Instructions (Addendum)

## 2021-01-09 NOTE — Progress Notes (Signed)
Virtual Visit via Video Note  I connected with Temit[place]o  on 01/09/21 at  4:20 PM EDT by a video enabled telemedicine application and verified that I am speaking with the correct person using two identifiers.  Location patient: home,  Location provider:work or home office Persons participating in the virtual visit: patient, provider, patient's wife  I discussed the limitations of evaluation and management by telemedicine and the availability of in person appointments. The patient expressed understanding and agreed to proceed.   HPI:  Acute telemedicine visit for headache/fever: -Onset: yesterday -did two home covid tests which were negative -Symptoms include:headache, urine is cloudy and has odor, fever, chills, malaise -Denies:CP, SOB,cough, sore throat, nasal congestion more than his ordinary, NVD, vision changes, neck pain, weakness or numbness, sick contacts, inability to eat/drink/get out of bed, tick bites, recent travel -Has tried:ibuprofen which helped the HA -Pertinent past medical history: see below and reports hx of UTI -Pertinent medication allergies:  Allergies  Allergen Reactions   Aspirin     REACTION: upsets stomach   Statins     REACTION: UPSETS STOMACH   Gadolinium Derivatives Nausea And Vomiting    Pt was given 20 ml multihance and became nauseated and vomited several times.   -COVID-19 vaccine status: 2 doses and one booster  ROS: See pertinent positives and negatives per HPI.  Past Medical History:  Diagnosis Date   Allergy    seasonal allergies   Anxiety    at times   Arthritis    generalized   Blood transfusion without reported diagnosis 2013   when had diverticulitis flare   BPH (benign prostatic hypertrophy)    Colon polyp    diverticular bleed with transfusion   Diverticulosis    Hyperlipidemia    on meds   Hypertension    on meds   Kidney stones    hx of 15-20 kidney stones    Obesity, unspecified    Prostate cancer ([place]) 2020   dx  05/2019   Prostate nodule [id number]    Past Surgical History:  Procedure Laterality Date   ANKLE FUSION Left 1991   COLONOSCOPY  2011   DB-F/V-miralax(good)-TICS/3 HPP   HERNIA REPAIR N/A    umbilical    POLYPECTOMY  2011   3 HPP   TRANSURETHRAL RESECTION OF PROSTATE  2020     Current Outpatient Medications:    ALPRAZolam (XANAX) 0.5 MG tablet, Take 1 tablet (0.5 mg total) by mouth daily as needed for anxiety., Disp: 20 tablet, Rfl: 3   lisinopril-hydrochlorothiazide (ZESTORETIC) 20-12.5 MG tablet, Take 1 tablet by mouth daily., Disp: 90 tablet, Rfl: 3   multivitamin (ONE-A-DAY MEN'S) TABS tablet, Take 1 tablet by mouth daily., Disp: , Rfl:    oxybutynin (DITROPAN) 5 MG tablet, Take 5 mg by mouth at bedtime., Disp: , Rfl:    triamcinolone (NASACORT) 55 MCG/ACT AERO nasal inhaler, PLACE 2 SPRAYS INTO THE NOSE DAILY., Disp: 16.9 each, Rfl: 12  EXAM:  VITALS per patient if applicable:  GENERAL: alert, oriented, appears well and in no acute distress  HEENT: atraumatic, conjunttiva clear, no obvious abnormalities on inspection of external nose and ears  NECK: normal movements of the head and neck  LUNGS: on inspection no signs of respiratory distress, breathing rate appears normal, no obvious gross SOB, gasping or wheezing  CV: no obvious cyanosis  MS: moves all visible extremities without noticeable abnormality  PSYCH/NEURO: pleasant and cooperative, no obvious depression or anxiety, speech and thought processing grossly intact  ASSESSMENT  AND PLAN:  Discussed the following assessment and plan:  Abnormal urine odor  Dysuria  Fever, unspecified fever cause  Nonintractable headache, unspecified chronicity pattern, unspecified headache type  -we discussed possible serious and likely etiologies, options for evaluation and workup, limitations of telemedicine visit vs in person visit, treatment, treatment risks and precautions. Pt prefers to treat via telemedicine  empirically rather than in person at this moment. Given his symptoms advised prompt inperson evaluation as likely needs urine studies, possible labs, covid repeat testing possible flu testing and possible other tests pending exam. Discussed options for inperson care and he and his wife agree to seek inperson care at an [facility] this evening as his PCP office is not available. Did let this patient know that I only do telemedicine on Tuesdays and Thursdays for [place]. Advised to schedule follow up visit with PCP or [facility] if any further questions or concerns to avoid delays in care.   I discussed the assessment and treatment plan with the patient. The patient was provided an opportunity to ask questions and all were answered. The patient agreed with the plan and demonstrated an understanding of the instructions.     Lucretia Kern, DO

## 2021-01-09 NOTE — ED Triage Notes (Signed)
Pt c/o cloudy urine with an odor and urgency/frequency since yesterday with fever and chills. States had a virtual visit and suggested to come here for a UA. States last Ibuprofen was this am.

## 2021-01-09 NOTE — Patient Instructions (Signed)
Please seek inperson medical care this evening as we discussed. If worsening symptoms with severe or life threatening symptoms please call 911 and seek emergency care.    I hope you are feeling better soon!   It was nice to meet you today. I help [facility] out with telemedicine visits on Tuesdays and Thursdays and am available for visits on those days. If you have any concerns or questions following this visit please schedule a follow up visit with your Primary Care doctor or seek care at a local urgent care clinic to avoid delays in care.

## 2021-01-09 NOTE — ED Provider Notes (Signed)
[facility]   MRN: [id number] DOB: 05-21-1951  Subjective:   [name] [name] is a 70 y.o. male presenting for 1 day history of acute onset frequency, urinary urgency or cloudy malodorous urine with fever and chills.  He had a virtual visit was advised to come in for an evaluation, urinalysis.  Denies nausea, vomiting, abdominal pelvic pain, flank pain.  He does have a history of kidney stones but reports that this feels different.  No current facility-administered medications for this encounter.  Current Outpatient Medications:    ALPRAZolam (XANAX) 0.5 MG tablet, Take 1 tablet (0.5 mg total) by mouth daily as needed for anxiety., Disp: 20 tablet, Rfl: 3   lisinopril-hydrochlorothiazide (ZESTORETIC) 20-12.5 MG tablet, Take 1 tablet by mouth daily., Disp: 90 tablet, Rfl: 3   multivitamin (ONE-A-DAY MEN'S) TABS tablet, Take 1 tablet by mouth daily., Disp: , Rfl:    oxybutynin (DITROPAN) 5 MG tablet, Take 5 mg by mouth at bedtime., Disp: , Rfl:    triamcinolone (NASACORT) 55 MCG/ACT AERO nasal inhaler, PLACE 2 SPRAYS INTO THE NOSE DAILY., Disp: 16.9 each, Rfl: 12   Allergies  Allergen Reactions   Aspirin     REACTION: upsets stomach   Statins     REACTION: UPSETS STOMACH   Gadolinium Derivatives Nausea And Vomiting    Pt was given 20 ml multihance and became nauseated and vomited several times.     Past Medical History:  Diagnosis Date   Allergy    seasonal allergies   Anxiety    at times   Arthritis    generalized   Blood transfusion without reported diagnosis 2013   when had diverticulitis flare   BPH (benign prostatic hypertrophy)    Colon polyp    diverticular bleed with transfusion   Diverticulosis    Hyperlipidemia    on meds   Hypertension    on meds   Kidney stones    hx of 15-20 kidney stones    Obesity, unspecified    Prostate cancer ([place]) 2020   dx 05/2019   Prostate nodule [id number]     Past Surgical History:  Procedure Laterality Date    ANKLE FUSION Left 1991   COLONOSCOPY  2011   DB-F/V-miralax(good)-TICS/3 HPP   HERNIA REPAIR N/A    umbilical    POLYPECTOMY  2011   3 HPP   TRANSURETHRAL RESECTION OF PROSTATE  2020    Family History  Problem Relation Age of Onset   Diabetes Mother    Breast cancer Mother 64   Prostate cancer Father        patient did not have a relationship with his father but understands he had prostate ca   Colon polyps Father 58   Colon cancer Father 66   Diabetes Sister    Pancreatic cancer Neg Hx    Esophageal cancer Neg Hx    Stomach cancer Neg Hx    Rectal cancer Neg Hx     Social History   Tobacco Use   Smoking status: Never   Smokeless tobacco: Never  Vaping Use   Vaping Use: Never used  Substance Use Topics   Alcohol use: No   Drug use: No    ROS   Objective:   Vitals: BP (!) 142/81 (BP Location: Left Arm)   Pulse 93   Temp 99.7 F (37.6 C) (Oral)   Resp 18   SpO2 96%   Physical Exam Constitutional:      General: He is not in acute  distress.    Appearance: Normal appearance. He is well-developed. He is obese. He is ill-appearing. He is not toxic-appearing or diaphoretic.  HENT:     Head: Normocephalic and atraumatic.     Right Ear: External ear normal.     Left Ear: External ear normal.     Nose: Nose normal.     Mouth/Throat:     Pharynx: Oropharynx is clear.  Eyes:     General: No scleral icterus.       Right eye: No discharge.        Left eye: No discharge.     Extraocular Movements: Extraocular movements intact.     Pupils: Pupils are equal, round, and reactive to light.  Cardiovascular:     Rate and Rhythm: Normal rate.  Pulmonary:     Effort: Pulmonary effort is normal.  Abdominal:     General: Bowel sounds are normal. There is no distension.     Palpations: Abdomen is soft. There is no mass.     Tenderness: There is no abdominal tenderness. There is no right CVA tenderness, left CVA tenderness, guarding or rebound.  Musculoskeletal:      Cervical back: Normal range of motion.  Neurological:     Mental Status: He is alert and oriented to person, place, and time.  Psychiatric:        Mood and Affect: Mood normal.        Behavior: Behavior normal.        Thought Content: Thought content normal.        Judgment: Judgment normal.    Results for orders placed or performed during the hospital encounter of 01/09/21 (from the past 24 hour(s))  POCT urinalysis dipstick     Status: Abnormal   Collection Time: 01/09/21  6:58 PM  Result Value Ref Range   Color, UA yellow yellow   Clarity, UA cloudy (A) clear   Glucose, UA negative negative mg/dL   Bilirubin, UA negative negative   Ketones, POC UA trace (5) (A) negative mg/dL   Spec Grav, UA 1.025 1.010 - 1.025   Blood, UA large (A) negative   pH, UA 5.5 5.0 - 8.0   Protein Ur, POC =100 (A) negative mg/dL   Urobilinogen, UA 0.2 0.2 or 1.0 E.U./dL   Nitrite, UA Positive (A) Negative   Leukocytes, UA Large (3+) (A) Negative    Assessment and Plan :   PDMP not reviewed this encounter.  1. Acute cystitis with hematuria   2. Cloudy urine     Start Keflex to cover for acute cystitis, urine culture pending.  Recommended aggressive hydration, limiting urinary irritants. Counseled patient on potential for adverse effects with medications prescribed/recommended today, ER and return-to-clinic precautions discussed, patient verbalized understanding.     Jaynee Eagles, [place] 01/09/21 [phone]

## 2021-01-10 ENCOUNTER — Ambulatory Visit: Payer: Self-pay | Admitting: Emergency Medicine

## 2021-01-15 NOTE — Chronic Care Management (AMB) (Signed)
  Care Management   Note  01/15/2021 Name: [name] [name] MRN: [id number] DOB: 09-17-1950  [name] [name] is a 70 y.o. year old male who is a primary care patient of Horald Pollen, MD and is actively engaged with the care management team. I reached out to Sharlette Dense by phone today to assist with re-scheduling a follow up visit with the RN Case Manager  Follow up plan: Telephone appointment with care management team member scheduled for: 02/02/2021  Julian Hy, [place], [place] Management  Direct Dial: [phone]

## 2021-01-23 ENCOUNTER — Other Ambulatory Visit: Payer: Self-pay

## 2021-01-23 ENCOUNTER — Ambulatory Visit (INDEPENDENT_AMBULATORY_CARE_PROVIDER_SITE_OTHER): Payer: Medicare Other | Admitting: Emergency Medicine

## 2021-01-23 ENCOUNTER — Encounter: Payer: Self-pay | Admitting: Emergency Medicine

## 2021-01-23 VITALS — BP 132/70 | HR 84 | Ht 74.0 in | Wt 312.0 lb

## 2021-01-23 DIAGNOSIS — R7303 Prediabetes: Secondary | ICD-10-CM

## 2021-01-23 DIAGNOSIS — R300 Dysuria: Secondary | ICD-10-CM

## 2021-01-23 DIAGNOSIS — Z23 Encounter for immunization: Secondary | ICD-10-CM | POA: Diagnosis not present

## 2021-01-23 DIAGNOSIS — M654 Radial styloid tenosynovitis [de Quervain]: Secondary | ICD-10-CM | POA: Diagnosis not present

## 2021-01-23 DIAGNOSIS — E785 Hyperlipidemia, unspecified: Secondary | ICD-10-CM | POA: Diagnosis not present

## 2021-01-23 DIAGNOSIS — Z87440 Personal history of urinary (tract) infections: Secondary | ICD-10-CM | POA: Diagnosis not present

## 2021-01-23 DIAGNOSIS — I10 Essential (primary) hypertension: Secondary | ICD-10-CM

## 2021-01-23 DIAGNOSIS — C61 Malignant neoplasm of prostate: Secondary | ICD-10-CM

## 2021-01-23 LAB — COMPREHENSIVE METABOLIC PANEL
ALT: 19 U/L (ref 0–53)
AST: 18 U/L (ref 0–37)
Albumin: 4.1 g/dL (ref 3.5–5.2)
Alkaline Phosphatase: 52 U/L (ref 39–117)
BUN: 20 mg/dL (ref 6–23)
CO2: 28 mEq/L (ref 19–32)
Calcium: 10 mg/dL (ref 8.4–10.5)
Chloride: 105 mEq/L (ref 96–112)
Creatinine, Ser: 1.26 mg/dL (ref 0.40–1.50)
GFR: 58.6 mL/min — ABNORMAL LOW (ref >60.00)
Glucose, Bld: 102 mg/dL — ABNORMAL HIGH (ref 70–99)
Potassium: 4 mEq/L (ref 3.5–5.1)
Sodium: 141 mEq/L (ref 135–145)
Total Bilirubin: 0.4 mg/dL (ref 0.2–1.2)
Total Protein: 7.4 g/dL (ref 6.0–8.3)

## 2021-01-23 LAB — POCT URINALYSIS DIP (MANUAL ENTRY)
Bilirubin, UA: NEGATIVE
Glucose, UA: NEGATIVE mg/dL
Ketones, POC UA: NEGATIVE mg/dL
Leukocytes, UA: NEGATIVE
Nitrite, UA: POSITIVE — AB
Protein Ur, POC: NEGATIVE mg/dL
Spec Grav, UA: 1.025 (ref 1.010–1.025)
Urobilinogen, UA: 0.2 E.U./dL
pH, UA: 6 (ref 5.0–8.0)

## 2021-01-23 LAB — LIPID PANEL
Cholesterol: 178 mg/dL (ref 0–200)
HDL: 34.1 mg/dL — ABNORMAL LOW (ref >39.00)
NonHDL: 144.22
Total CHOL/HDL Ratio: 5
Triglycerides: 206 mg/dL — ABNORMAL HIGH (ref 0.0–149.0)
VLDL: 41.2 mg/dL — ABNORMAL HIGH (ref 0.0–40.0)

## 2021-01-23 LAB — HEMOGLOBIN A1C: Hgb A1c MFr Bld: 6.4 % (ref 4.6–6.5)

## 2021-01-23 LAB — LDL CHOLESTEROL, DIRECT: Direct LDL: 102 mg/dL

## 2021-01-23 MED ORDER — CEFUROXIME AXETIL 500 MG PO TABS: 500.0000 mg | ORAL_TABLET | Freq: Two times a day (BID) | ORAL | 0 refills | Status: AC

## 2021-01-23 NOTE — Assessment & Plan Note (Signed)
Diet and nutrition discussed. 

## 2021-01-23 NOTE — Assessment & Plan Note (Signed)
Symptoms still present.  Start Ceftin 500 mg twice a day for 7 days.  Urinalysis still showing active infection.  Urine culture sent.

## 2021-01-23 NOTE — Assessment & Plan Note (Signed)
Well-controlled hypertension.  Continue Zestoretic 20-12.5 mg daily. Dietary approaches to stop hypertension discussed.

## 2021-01-23 NOTE — Assessment & Plan Note (Signed)
Still having lower urinary tract symptoms.  Needs follow-up with urologist. Continue Ditropan 5 mg at bedtime.  Patient has been off Flomax.

## 2021-01-23 NOTE — Assessment & Plan Note (Addendum)
Still active with significant tenderness.  Needs follow-up with orthopedist. Continue wearing wrist brace.

## 2021-01-23 NOTE — Progress Notes (Signed)
BP Readings from Last 3 Encounters:  01/09/21 (!) 142/81  11/16/20 138/80  07/12/20 133/77   Wt Readings from Last 3 Encounters:  11/16/20 (!) 313 lb 9.6 oz (142.2 kg)  07/12/20 (!) 306 lb (138.8 kg)  04/24/20 (!) 305 lb (138.3 kg)   [name] [name] 70 y.o.   Chief Complaint  Patient presents with   Hypertension    6 month follow    HISTORY OF PRESENT ILLNESS: This is a 70 y.o. male with history of hypertension here for follow-up. Has history of prostate cancer.  Recent urinary tract infection.  Was seen at urgent care center and started on Keflex.  Urine culture not available but urinalysis was positive for infection.  Still having some residual dysuria. Also still having pain on the right wrist since last June. Other complaints or medical concerns today. Assessment and plan from recent urgent care visit as follows: Assessment and Plan :    PDMP not reviewed this encounter.   1. Acute cystitis with hematuria   2. Cloudy urine   Start Keflex to cover for acute cystitis, urine culture pending.  Recommended aggressive hydration, limiting urinary irritants. Counseled patient on potential for adverse effects with medications prescribed/recommended today, ER and return-to-clinic precautions discussed, patient verbalized understanding. Jaynee Eagles, PA-C 01/09/21 1938   Hypertension Pertinent negatives include no chest pain, headaches, palpitations or shortness of breath.    Prior to Admission medications   Medication Sig Start Date End Date Taking? Authorizing Provider  ALPRAZolam Duanne Moron) 0.5 MG tablet Take 1 tablet (0.5 mg total) by mouth daily as needed for anxiety. 07/12/20  Yes Dreshon Proffit, Ines Bloomer, MD  lisinopril-hydrochlorothiazide (ZESTORETIC) 20-12.5 MG tablet Take 1 tablet by mouth daily. 11/16/20  Yes Rowin Bayron, Ines Bloomer, MD  multivitamin (ONE-A-DAY MEN'S) TABS tablet Take 1 tablet by mouth daily.   Yes [provider]  oxybutynin (DITROPAN) 5 MG tablet Take  5 mg by mouth at bedtime. 08/10/20  Yes [provider]  triamcinolone (NASACORT) 55 MCG/ACT AERO nasal inhaler PLACE 2 SPRAYS INTO THE NOSE DAILY. 06/04/20  Yes SagardiaInes Bloomer, MD    Allergies  Allergen Reactions   Aspirin     REACTION: upsets stomach   Statins     REACTION: UPSETS STOMACH   Gadolinium Derivatives Nausea And Vomiting    Pt was given 20 ml multihance and became nauseated and vomited several times.     Patient Active Problem List   Diagnosis Date Noted   Tenosynovitis, Alfonse Ras 11/16/2020   Body mass index (BMI) of 38.0-38.9 in adult 01/10/2020   Diverticulosis 01/10/2020   Prediabetes 01/10/2020   Dyslipidemia 01/10/2020   Malignant neoplasm of prostate ([place]) 06/08/2019   Prostate enlargement 08/29/2017   Gout 10/25/2014   Hypertension    BPH (benign prostatic hypertrophy)    Kidney stones    Prostate nodule     Past Medical History:  Diagnosis Date   Allergy    seasonal allergies   Anxiety    at times   Arthritis    generalized   Blood transfusion without reported diagnosis 2013   when had diverticulitis flare   BPH (benign prostatic hypertrophy)    Colon polyp    diverticular bleed with transfusion   Diverticulosis    Hyperlipidemia    on meds   Hypertension    on meds   Kidney stones    hx of 15-20 kidney stones    Obesity, unspecified    Prostate cancer ([place]) 2020  dx 05/2019   Prostate nodule [id number]    Past Surgical History:  Procedure Laterality Date   ANKLE FUSION Left 1991   COLONOSCOPY  2011   DB-F/V-miralax(good)-TICS/3 HPP   HERNIA REPAIR N/A    umbilical    POLYPECTOMY  2011   3 HPP   TRANSURETHRAL RESECTION OF PROSTATE  2020    Social History   Socioeconomic History   Marital status: Married    Spouse name: Not on file   Number of children: 2   Years of education: Not on file   Highest education level: Not on file  Occupational History   Not on file  Tobacco Use   Smoking status: Never    Smokeless tobacco: Never  Vaping Use   Vaping Use: Never used  Substance and Sexual Activity   Alcohol use: No   Drug use: No   Sexual activity: Yes  Other Topics Concern   Not on file  Social History Narrative   Not on file   Social Determinants of Health   Financial Resource Strain: Low Risk    Difficulty of Paying Living Expenses: Not hard at all  Food Insecurity: No Food Insecurity   Worried About Charity fundraiser in the Last Year: Never true   [place] in the Last Year: Never true  Transportation Needs: No Transportation Needs   Lack of Transportation (Medical): No   Lack of Transportation (Non-Medical): No  Physical Activity: Not on file  Stress: Not on file  Social Connections: Not on file  Intimate Partner Violence: Not on file    Family History  Problem Relation Age of Onset   Diabetes Mother    Breast cancer Mother 30   Prostate cancer Father        patient did not have a relationship with his father but understands he had prostate ca   Colon polyps Father 30   Colon cancer Father 41   Diabetes Sister    Pancreatic cancer Neg Hx    Esophageal cancer Neg Hx    Stomach cancer Neg Hx    Rectal cancer Neg Hx      Review of Systems  Constitutional: Negative.  Negative for chills and fever.  HENT: Negative.  Negative for congestion and sore throat.   Respiratory: Negative.  Negative for cough and shortness of breath.   Cardiovascular: Negative.  Negative for chest pain and palpitations.  Gastrointestinal:  Negative for abdominal pain, diarrhea, nausea and vomiting.  Skin: Negative.  Negative for rash.  Neurological:  Negative for dizziness and headaches.  All other systems reviewed and are negative.   Physical Exam Vitals reviewed.  Constitutional:      Appearance: Normal appearance.  HENT:     Head: Normocephalic.  Eyes:     Extraocular Movements: Extraocular movements intact.     Pupils: Pupils are equal, round, and reactive to light.   Cardiovascular:     Rate and Rhythm: Normal rate and regular rhythm.     Pulses: Normal pulses.     Heart sounds: Normal heart sounds.  Pulmonary:     Effort: Pulmonary effort is normal.     Breath sounds: Normal breath sounds.  Abdominal:     Palpations: Abdomen is soft.     Tenderness: There is no abdominal tenderness. There is no right CVA tenderness or left CVA tenderness.  Musculoskeletal:     Cervical back: Normal range of motion and neck supple.     Comments: Right  wrist: Positive Finkelstein sign.  Significant tenderness at tendon just proximal to the thumb  Skin:    General: Skin is warm and dry.  Neurological:     General: No focal deficit present.     Mental Status: He is alert and oriented to person, place, and time.  Psychiatric:        Mood and Affect: Mood normal.        Behavior: Behavior normal.    Results for orders placed or performed in visit on 01/23/21 (from the past 24 hour(s))  POCT urinalysis dipstick     Status: Abnormal   Collection Time: 01/23/21 11:00 AM  Result Value Ref Range   Color, UA yellow yellow   Clarity, UA clear clear   Glucose, UA negative negative mg/dL   Bilirubin, UA negative negative   Ketones, POC UA negative negative mg/dL   Spec Grav, UA 1.025 1.010 - 1.025   Blood, UA trace-intact (A) negative   pH, UA 6.0 5.0 - 8.0   Protein Ur, POC negative negative mg/dL   Urobilinogen, UA 0.2 0.2 or 1.0 E.U./dL   Nitrite, UA Positive (A) Negative   Leukocytes, UA Negative Negative    ASSESSMENT & PLAN: Essential hypertension Well-controlled hypertension.  Continue Zestoretic 20-12.5 mg daily. Dietary approaches to stop hypertension discussed.  Tenosynovitis, de Quervain Still active with significant tenderness.  Needs follow-up with orthopedist. Continue wearing wrist brace.  Benign prostatic hyperplasia Still having lower urinary tract symptoms.  Needs follow-up with urologist. Continue Ditropan 5 mg at bedtime.  Patient has  been off Flomax.  Dyslipidemia Diet and nutrition discussed.  Dysuria Symptoms still present.  Start Ceftin 500 mg twice a day for 7 days.  Urinalysis still showing active infection.  Urine culture sent.  Shun was seen today for hypertension.  Diagnoses and all orders for this visit:  Dysuria -     cefUROXime (CEFTIN) 500 MG tablet; Take 1 tablet (500 mg total) by mouth 2 (two) times daily with a meal for 7 days.  Recent urinary tract infection -     Urine Culture -     POCT urinalysis dipstick -     cefUROXime (CEFTIN) 500 MG tablet; Take 1 tablet (500 mg total) by mouth 2 (two) times daily with a meal for 7 days.  Need for shingles vaccine -     Varicella-zoster vaccine IM (Shingrix)  Tenosynovitis, de Quervain  Essential hypertension -     Comprehensive metabolic panel  Malignant neoplasm of prostate (HCC)  Dyslipidemia -     Lipid panel  Prediabetes -     Hemoglobin A1c  Patient Instructions  Health Maintenance After Age 38 After age 78, you are at a higher risk for certain long-term diseases and infections as well as injuries from falls. Falls are a major cause of broken bones and head injuries in people who are older than age 71. Getting regular preventive care can help to keep you healthy and well. Preventive care includes getting regular testing and making lifestyle changes as recommended by your health care provider. Talk with your health care provider about: Which screenings and tests you should have. A screening is a test that checks for a disease when you have no symptoms. A diet and exercise plan that is right for you. What should I know about screenings and tests to prevent falls? Screening and testing are the best ways to find a health problem early. Early diagnosis and treatment give you the best chance of  managing medical conditions that are common after age 107. Certain conditions and lifestyle choices may make you more likely to have a fall. Your health  care provider may recommend: Regular vision checks. Poor vision and conditions such as cataracts can make you more likely to have a fall. If you wear glasses, make sure to get your prescription updated if your vision changes. Medicine review. Work with your health care provider to regularly review all of the medicines you are taking, including over-the-counter medicines. Ask your health care provider about any side effects that may make you more likely to have a fall. Tell your health care provider if any medicines that you take make you feel dizzy or sleepy. Osteoporosis screening. Osteoporosis is a condition that causes the bones to get weaker. This can make the bones weak and cause them to break more easily. Blood pressure screening. Blood pressure changes and medicines to control blood pressure can make you feel dizzy. Strength and balance checks. Your health care provider may recommend certain tests to check your strength and balance while standing, walking, or changing positions. Foot health exam. Foot pain and numbness, as well as not wearing proper footwear, can make you more likely to have a fall. Depression screening. You may be more likely to have a fall if you have a fear of falling, feel emotionally low, or feel unable to do activities that you used to do. Alcohol use screening. Using too much alcohol can affect your balance and may make you more likely to have a fall. What actions can I take to lower my risk of falls? General instructions Talk with your health care provider about your risks for falling. Tell your health care provider if: You fall. Be sure to tell your health care provider about all falls, even ones that seem minor. You feel dizzy, sleepy, or off-balance. Take over-the-counter and prescription medicines only as told by your health care provider. These include any supplements. Eat a healthy diet and maintain a healthy weight. A healthy diet includes low-fat dairy products,  low-fat (lean) meats, and fiber from whole grains, beans, and lots of fruits and vegetables. Home safety Remove any tripping hazards, such as rugs, cords, and clutter. Install safety equipment such as grab bars in bathrooms and safety rails on stairs. Keep rooms and walkways well-lit. Activity  Follow a regular exercise program to stay fit. This will help you maintain your balance. Ask your health care provider what types of exercise are appropriate for you. If you need a cane or walker, use it as recommended by your health care provider. Wear supportive shoes that have nonskid soles.  Lifestyle Do not drink alcohol if your health care provider tells you not to drink. If you drink alcohol, limit how much you have: 0-1 drink a day for women. 0-2 drinks a day for men. Be aware of how much alcohol is in your drink. In the [place] one drink equals one typical bottle of beer (12 oz), one-half glass of wine (5 oz), or one shot of hard liquor (1 oz). Do not use any products that contain nicotine or tobacco, such as cigarettes and e-cigarettes. If you need help quitting, ask your health care provider. Summary Having a healthy lifestyle and getting preventive care can help to protect your health and wellness after age 27. Screening and testing are the best way to find a health problem early and help you avoid having a fall. Early diagnosis and treatment give you the best chance for  managing medical conditions that are more common for people who are older than age 41. Falls are a major cause of broken bones and head injuries in people who are older than age 32. Take precautions to prevent a fall at home. Work with your health care provider to learn what changes you can make to improve your health and wellness and to prevent falls. This information is not intended to replace advice given to you by your health care provider. Make sure you discuss any questions you have with your healthcare  provider. Document Revised: 05/12/2020 Document Reviewed: 05/12/2020 Elsevier Patient Education  2022 [place], MD [facility]

## 2021-01-23 NOTE — Patient Instructions (Signed)

## 2021-01-25 LAB — URINE CULTURE

## 2021-02-02 ENCOUNTER — Other Ambulatory Visit: Payer: Self-pay | Admitting: Emergency Medicine

## 2021-02-02 ENCOUNTER — Ambulatory Visit (INDEPENDENT_AMBULATORY_CARE_PROVIDER_SITE_OTHER): Payer: Medicare Other | Admitting: *Deleted

## 2021-02-02 DIAGNOSIS — E785 Hyperlipidemia, unspecified: Secondary | ICD-10-CM

## 2021-02-02 DIAGNOSIS — I10 Essential (primary) hypertension: Secondary | ICD-10-CM | POA: Diagnosis not present

## 2021-02-02 NOTE — Patient Instructions (Signed)
Visit Information  [name] [name], it was nice talking with you today.   Please read over the attached information, and keep up the great work checking and writing down your blood pressures at home and trying to stay active   I look forward to talking to you again for an update on Monday, July 30, 2021 at 9:00 am- please be listening out for my call that day.  I will call as close to 9:00 am as possible.   If you need to cancel or re-schedule our telephone visit, please call [phone] and one of our care guides will be happy to assist you.   I look forward to hearing about your progress.   Please don't hesitate to contact me if I can be of assistance to you before our next scheduled telephone appointment.   [name], RN, BSN, [facility] RN Care Coordination- [name]: direct office [phone]: mobile    PATIENT GOALS:  Goals Addressed             This Visit's Progress    Track and Manage My Blood Pressure-Hypertension/ HLD   On track    Timeframe:  Long-Range Goal Priority:  Medium Start Date:      12/07/20                       Expected End Date:    12/07/21                   Follow Up Date 07/30/2020    Check your blood pressures 2 times per week- once when you are relaxed and then again after activity: we will review your blood pressures at home during each of our phone call appointments You reported a blood pressure today of 130/72: this is in good range- keep up the great working checking and writing down on paper your blood pressures at home twice a week Write your blood pressure results down on paper, in a log or diary so you don't forget what they are Read over the attached information and start thinking of ways you can begin to decrease the salt in your diet and get more activity-- I am glad to hear that you have started walking every week: keep up the great work-- staying active is very important in controlling your  blood pressure and cholesterol, and helps you stay mobile Always take your medications as they are prescribed Read over the attached information about foods that are high in cholesterol and try to avoid those foods   Why is this important?   You won't feel high blood pressure, but it can still hurt your blood vessels.  High blood pressure can cause heart or kidney problems. It can also cause a stroke.  Making lifestyle changes like losing a little weight or eating less salt will help.  Checking your blood pressure at home and at different times of the day can help to control blood pressure.  If the doctor prescribes medicine remember to take it the way the doctor ordered.  Call the office if you cannot afford the medicine or if there are questions about it.            Cholesterol Content in Foods Cholesterol is a waxy, fat-like substance that helps to carry fat in the blood. The body needs cholesterol in small amounts, but too much cholesterol can cause damage to the arteries and heart. Most people should eat less  than 200 milligrams (mg) of cholesterol a day. Foods with cholesterol Cholesterol is found in animal-based foods, such as meat, seafood, and dairy. Generally, low-fat dairy and lean meats have less cholesterol than full-fat dairy and fatty meats. The milligrams of cholesterol per serving (mg per serving) of common cholesterol-containing foods are listed below. Meat and other proteins Egg -- one large whole egg has 186 mg. Veal shank -- 4 oz has 141 mg. Lean ground [place] (93% lean) -- 4 oz has 118 mg. Fat-trimmed lamb loin -- 4 oz has 106 mg. Lean ground beef (90% lean) -- 4 oz has 100 mg. Lobster -- 3.5 oz has 90 mg. Pork loin chops -- 4 oz has 86 mg. Canned salmon -- 3.5 oz has 83 mg. Fat-trimmed beef top loin -- 4 oz has 78 mg. Frankfurter -- 1 frank (3.5 oz) has 77 mg. Crab -- 3.5 oz has 71 mg. Roasted chicken without skin, white meat -- 4 oz has 66 mg. Light bologna  -- 2 oz has 45 mg. Deli-cut [place] -- 2 oz has 31 mg. Canned tuna -- 3.5 oz has 31 mg. Berniece Salines -- 1 oz has 29 mg. Oysters and mussels (raw) -- 3.5 oz has 25 mg. Mackerel -- 1 oz has 22 mg. Trout -- 1 oz has 20 mg. Pork sausage -- 1 link (1 oz) has 17 mg. Salmon -- 1 oz has 16 mg. Tilapia -- 1 oz has 14 mg. Dairy Soft-serve ice cream --  cup (4 oz) has 103 mg. Whole-milk yogurt -- 1 cup (8 oz) has 29 mg. Cheddar cheese -- 1 oz has 28 mg. American cheese -- 1 oz has 28 mg. Whole milk -- 1 cup (8 oz) has 23 mg. 2% milk -- 1 cup (8 oz) has 18 mg. Cream cheese -- 1 tablespoon (Tbsp) has 15 mg. Cottage cheese --  cup (4 oz) has 14 mg. Low-fat (1%) milk -- 1 cup (8 oz) has 10 mg. Sour cream -- 1 Tbsp has 8.5 mg. Low-fat yogurt -- 1 cup (8 oz) has 8 mg. Nonfat Greek yogurt -- 1 cup (8 oz) has 7 mg. Half-and-half cream -- 1 Tbsp has 5 mg. Fats and oils Cod liver oil -- 1 tablespoon (Tbsp) has 82 mg. Butter -- 1 Tbsp has 15 mg. Lard -- 1 Tbsp has 14 mg. Bacon grease -- 1 Tbsp has 14 mg. Mayonnaise -- 1 Tbsp has 5-10 mg. Margarine -- 1 Tbsp has 3-10 mg. Exact amounts of cholesterol in these foods may vary depending on specific ingredients and brands. Foods without cholesterol Most plant-based foods do not have cholesterol unless you combine them with a food that has cholesterol. Foods without cholesterol include: Grains and cereals. Vegetables. Fruits. Vegetable oils, such as olive, canola, and sunflower oil. Legumes, such as peas, beans, and lentils. Nuts and seeds. Egg whites. Summary The body needs cholesterol in small amounts, but too much cholesterol can cause damage to the arteries and heart. Most people should eat less than 200 milligrams (mg) of cholesterol a day. This information is not intended to replace advice given to you by your health care provider. Make sure you discuss any questions you have with your health care provider. Document Revised: 09/07/2019 Document  Reviewed: 10/18/2019 Elsevier Patient Education  2022 [place].  Patient verbalizes understanding of instructions provided today and agrees to view in [place].  Telephone follow up appointment with care management team member scheduled for:  Monday, July 30, 2021 at 9:00 am The patient has  been provided with contact information for the care management team and has been advised to call with any health related questions or concerns  [name], RN, BSN, [place] [phone]: direct office [phone]: mobile

## 2021-02-02 NOTE — Chronic Care Management (AMB) (Signed)
Chronic Care Management   CCM RN Visit Note  02/02/2021 Name: [name] [name] MRN: [id number] DOB: May 29, 1951  Subjective: [name] [name] is a 70 y.o. year old male who is a primary care patient of Sagardia, Ines Bloomer, MD. The care management team was consulted for assistance with disease management and care coordination needs.    Engaged with patient by telephone for follow up visit in response to provider referral for case management and/or care coordination services.   Consent to Services:  The patient was given information about Chronic Care Management services, agreed to services, and gave verbal consent prior to initiation of services.  Please see initial visit note for detailed documentation.  Patient agreed to services and verbal consent obtained.   Assessment: Review of patient past medical history, allergies, medications, health status, including review of consultants reports, laboratory and other test data, was performed as part of comprehensive evaluation and provision of chronic care management services.   CCM Care Plan Allergies  Allergen Reactions   Aspirin     REACTION: upsets stomach   Statins     REACTION: UPSETS STOMACH   Gadolinium Derivatives Nausea And Vomiting    Pt was given 20 ml multihance and became nauseated and vomited several times.    Outpatient Encounter Medications as of 02/02/2021  Medication Sig Note   rosuvastatin (CRESTOR) 10 MG tablet Take 10 mg by mouth daily. 02/02/2021: 02/02/21: Patient reports that this was discontinued during prostate treatment- reports he has started back now and is taking as prescribed- takes in morning   ALPRAZolam (XANAX) 0.5 MG tablet Take 1 tablet (0.5 mg total) by mouth daily as needed for anxiety.    lisinopril-hydrochlorothiazide (ZESTORETIC) 20-12.5 MG tablet Take 1 tablet by mouth daily. 12/07/2020: 12/07/20: Patient reports he has not yet heard from pharmacy from 11/16/20 PCP dosage increase: reports currently still  taking 10-12.5 mg QD: care coordination outreach placed to pharmacy- see narrative in care plan for details   multivitamin (ONE-A-DAY MEN'S) TABS tablet Take 1 tablet by mouth daily.    oxybutynin (DITROPAN) 5 MG tablet Take 5 mg by mouth at bedtime.    triamcinolone (NASACORT) 55 MCG/ACT AERO nasal inhaler PLACE 2 SPRAYS INTO THE NOSE DAILY.    No facility-administered encounter medications on file as of 02/02/2021.   Patient Active Problem List   Diagnosis Date Noted   Recent urinary tract infection 01/23/2021   Dysuria 01/23/2021   Tenosynovitis, de Quervain 11/16/2020   Body mass index (BMI) of 38.0-38.9 in adult 01/10/2020   Diverticulosis 01/10/2020   Prediabetes 01/10/2020   Dyslipidemia 01/10/2020   Malignant neoplasm of prostate ([place]) 06/08/2019   Prostate enlargement 08/29/2017   Gout 10/25/2014   Essential hypertension    Benign prostatic hyperplasia    Kidney stones    Prostate nodule    Conditions to be addressed/monitored:  HTN and HLD  Care Plan : Hypertension (Adult)  Updates made by Knox Royalty, RN since 02/02/2021 12:00 AM     Problem: Hypertension (Hypertension)   Priority: Medium     Long-Range Goal: Hypertension Monitored   Start Date: 12/07/2020  Expected End Date: 12/07/2021  This Visit's Progress: On track  Recent Progress: On track  Priority: Medium  Note:   Objective:  Last practice recorded BP readings:  BP Readings from Last 3 Encounters:  11/16/20 138/80  07/12/20 133/77  04/24/20 120/80   Most recent eGFR/CrCl: No results found for: EGFR  No components found for: CRCL Current Barriers:  Knowledge  Deficit related to basic understanding of hypertension pathophysiology and self care management No current self-care deficits identified Case Manager Clinical Goal(s):  12/07/20: Over the next 12 months patient will demonstrate improved adherence to prescribed treatment plan for hypertension as evidenced by patient reporting during CCM RN CM  outreach of: Taking all medications as prescribed Monitoring and recording blood pressure twice a week Improved adherence to low sodium/DASH diet Increased activity Interventions:  Collaboration with Horald Pollen, MD regarding development and update of comprehensive plan of care as evidenced by provider attestation and co-signature Inter-disciplinary care team collaboration (see longitudinal plan of care) Evaluation of current treatment plan related to hypertension self management and patient's adherence to plan as established by provider; chart reviewed including relevant office notes, upcoming scheduled appointments, and lab results Discussed current  clinical condition with patient and confirmed no current clinical or medication concerns- reports "feeling better" after recently diagnosed UTI; confirmed patient continues to manage medications independently at home and reports adherence to medication regimen Confirmed that patient completed recently prescribed antibiotics Confirmed patient continues to take rosuvastatin- this medication had been taken off his medication list 01/09/21 by telemedicine provider: re-added to list today Reviewed 01/23/21 PCP office visit instructions with patient: he verbalizes good understanding of post-visit instructions Reviewed recent blood pressures at home with patient, he reports blood pressure this week of "130/72;" encouraged patient to continue monitoring/ recording at home 1-2 times weekly Confirmed patient continues to follow low salt, low cholesterol diet: reinforced previously provided education around same, will provide additional printed educational material today- encouraged patient to begin reviewing, making small changes to diet to incorporate into diet Confirmed patient has started walking several times weekly, about one mile- positive reinforcement provided; discussed benefits of regular exercise/ activity in setting of HTN/ HLD Confirmed  patient received/ reviewed previously provided education around Advanced Directive planning, low salt diet - denies questions, still considering Advanced Directive options Reiterated previously provided education around stroke prevention, s/s of heart attack and stroke, DASH diet, complications of uncontrolled blood pressure, benefits of increased activity, weight loss Reviewed upcoming provider appointments with patient and confirmed that patient has plans to attend all as scheduled: urology appointment "in September;" still needs to make orthopedic appointment for evaluation of wrist tendonitis/ knee pain; reports he had a shot for knee pain- "helped a little bit." Discussed plans with patient for ongoing care management follow up and provided patient with direct contact information for care management team Self-Care Activities: Self administers medications as prescribed Attends all scheduled provider appointments Calls provider office for new concerns, questions, or BP outside discussed parameters Tries to follow a low sodium diet/DASH diet Patient Goals: Check your blood pressures 2 times per week- once when you are relaxed and then again after activity: we will review your blood pressures at home during each of our phone call appointments You reported a blood pressure today of 130/72: this is in good range- keep up the great working checking and writing down on paper your blood pressures at home twice a week Write your blood pressure results down on paper, in a log or diary so you don't forget what they are Read over the attached information and start thinking of ways you can begin to decrease the salt in your diet and get more activity-- I am glad to hear that you have started walking every week: keep up the great work-- staying active is very important in controlling your blood pressure and cholesterol, and helps you stay mobile  Always take your medications as they are prescribed Read over the  attached information about foods that are high in cholesterol and try to avoid those foods Follow Up Plan:  Telephone follow up appointment with care management team member scheduled for:  Monday, July 30, 2021 at 9:00 am The patient has been provided with contact information for the care management team and has been advised to call with any health related questions or concerns.      Plan: Telephone follow up appointment with care management team member scheduled for:  Monday, July 30, 2021 at 9:00 am The patient has been provided with contact information for the care management team and has been advised to call with any health related questions or concerns  [name], RN, BSN, [place] [phone]: direct office [phone]: mobile

## 2021-02-16 DIAGNOSIS — N3000 Acute cystitis without hematuria: Secondary | ICD-10-CM | POA: Diagnosis not present

## 2021-02-16 DIAGNOSIS — R3121 Asymptomatic microscopic hematuria: Secondary | ICD-10-CM | POA: Diagnosis not present

## 2021-02-16 DIAGNOSIS — N3020 Other chronic cystitis without hematuria: Secondary | ICD-10-CM | POA: Diagnosis not present

## 2021-02-16 DIAGNOSIS — R350 Frequency of micturition: Secondary | ICD-10-CM | POA: Diagnosis not present

## 2021-02-26 DIAGNOSIS — K5730 Diverticulosis of large intestine without perforation or abscess without bleeding: Secondary | ICD-10-CM | POA: Diagnosis not present

## 2021-02-26 DIAGNOSIS — N281 Cyst of kidney, acquired: Secondary | ICD-10-CM | POA: Diagnosis not present

## 2021-02-26 DIAGNOSIS — R3129 Other microscopic hematuria: Secondary | ICD-10-CM | POA: Diagnosis not present

## 2021-02-26 DIAGNOSIS — N3020 Other chronic cystitis without hematuria: Secondary | ICD-10-CM | POA: Diagnosis not present

## 2021-04-03 DIAGNOSIS — Z20822 Contact with and (suspected) exposure to covid-19: Secondary | ICD-10-CM | POA: Diagnosis not present

## 2021-04-05 ENCOUNTER — Other Ambulatory Visit: Payer: Self-pay

## 2021-04-05 ENCOUNTER — Telehealth (INDEPENDENT_AMBULATORY_CARE_PROVIDER_SITE_OTHER): Payer: Medicare Other | Admitting: Internal Medicine

## 2021-04-05 DIAGNOSIS — R7303 Prediabetes: Secondary | ICD-10-CM | POA: Diagnosis not present

## 2021-04-05 DIAGNOSIS — U071 COVID-19: Secondary | ICD-10-CM

## 2021-04-05 DIAGNOSIS — I10 Essential (primary) hypertension: Secondary | ICD-10-CM

## 2021-04-05 MED ORDER — HYDROCODONE BIT-HOMATROP MBR 5-1.5 MG/5ML PO SOLN: 5.0000 mL | Freq: Four times a day (QID) | ORAL | 0 refills | Status: AC | PRN

## 2021-04-05 MED ORDER — NIRMATRELVIR/RITONAVIR (PAXLOVID) TABLET (RENAL DOSING): 2.0000 | ORAL_TABLET | Freq: Two times a day (BID) | ORAL | 0 refills | Status: AC

## 2021-04-05 NOTE — Patient Instructions (Signed)
Please take all new medication as prescribed 

## 2021-04-05 NOTE — Progress Notes (Signed)
Patient ID: [name] [name], male   DOB: [birth year], 70 y.o.   MRN: [id number]  Virtual Visit via Video Note  I connected with [name] [name] on ot 27, 2022 at  2:20 PM EDT by a video enabled telemedicine application and verified that I am speaking with the correct person using two identifiers.  Location of all participants tday Patient: at home Provider: at office   I discussed the limitations of evaluation and management by telemedicine and the availability of in person appointments. The patient expressed understanding and agreed to proceed.  History of Present Illness: Here to f/u with acute onset URI symptoms starting mon oct 24, felt more severe than usual, them tested covid + on tues oct 25, no prior hx of covid infection, c/o primarily HA, head congestion, non prod cough, fleeting chest and abd pains various locations worse after coughing, but taste and smell intact, no n/v or diarrhea or sob  Pt is s/p covid vax x 2 and booster x 1.  Pt denies wheezing, orthopnea, PND, increased LE swelling, palpitations, dizziness or syncope.   Pt denies polydipsia, polyuria, or new focal neuro s/s.   Pt denies fever, wt loss, night sweats, loss of appetite, or other constitutional symptoms  No other new complaints Past Medical History:  Diagnosis Date   Allergy    seasonal allergies   Anxiety    at times   Arthritis    generalized   Blood transfusion without reported diagnosis 2013   when had diverticulitis flare   BPH (benign prostatic hypertrophy)    Colon polyp    diverticular bleed with transfusion   Diverticulosis    Hyperlipidemia    on meds   Hypertension    on meds   Kidney stones    hx of 15-20 kidney stones    Obesity, unspecified    Prostate cancer ([place]) 2020   dx 05/2019   Prostate nodule [id number]   Past Surgical History:  Procedure Laterality Date   ANKLE FUSION Left 1991   COLONOSCOPY  2011   DB-F/V-miralax(good)-TICS/3 HPP   HERNIA REPAIR N/A    umbilical     POLYPECTOMY  2011   3 HPP   TRANSURETHRAL RESECTION OF PROSTATE  2020    reports that he has never smoked. He has never used smokeless tobacco. He reports that he does not drink alcohol and does not use drugs. family history includes Breast cancer (age of onset: 51) in his mother; Colon cancer (age of onset: 63) in his father; Colon polyps (age of onset: 39) in his father; Diabetes in his mother and sister; Prostate cancer in his father. Allergies  Allergen Reactions   Aspirin     REACTION: upsets stomach   Statins     REACTION: UPSETS STOMACH   Gadolinium Derivatives Nausea And Vomiting    Pt was given 20 ml multihance and became nauseated and vomited several times.    Current Outpatient Medications on File Prior to Visit  Medication Sig Dispense Refill   ALPRAZolam (XANAX) 0.5 MG tablet Take 1 tablet (0.5 mg total) by mouth daily as needed for anxiety. 20 tablet 3   lisinopril-hydrochlorothiazide (ZESTORETIC) 20-12.5 MG tablet Take 1 tablet by mouth daily. 90 tablet 3   multivitamin (ONE-A-DAY MEN'S) TABS tablet Take 1 tablet by mouth daily.     oxybutynin (DITROPAN) 5 MG tablet Take 5 mg by mouth at bedtime.     rosuvastatin (CRESTOR) 10 MG tablet TAKE 1 TABLET BY MOUTH EVERY DAY 90 tablet  1   triamcinolone (NASACORT) 55 MCG/ACT AERO nasal inhaler PLACE 2 SPRAYS INTO THE NOSE DAILY. 16.9 each 12   No current facility-administered medications on file prior to visit.    Observations/Objective: Alert, NAD, appropriate mood and affect, resps normal, cn 2-12 intact, moves all 4s, no visible rash or swelling Lab Results  Component Value Date   WBC 6.8 07/12/2019   HGB 13.7 07/12/2019   HCT 42.5 07/12/2019   PLT 202 07/12/2019   GLUCOSE 102 (H) 01/23/2021   CHOL 178 01/23/2021   TRIG 206.0 (H) 01/23/2021   HDL 34.10 (L) 01/23/2021   LDLDIRECT 102.0 01/23/2021   LDLCALC 110 (H) 01/10/2020   ALT 19 01/23/2021   AST 18 01/23/2021   NA 141 01/23/2021   K 4.0 01/23/2021   CL 105  01/23/2021   CREATININE 1.26 01/23/2021   BUN 20 01/23/2021   CO2 28 01/23/2021   TSH 1.830 01/09/2017   PSA 6.57 (H) 08/16/2013   INR 0.96 05/08/2014   HGBA1C 6.4 01/23/2021   Assessment and Plan: See notes  Follow Up Instructions: See notes   I discussed the assessment and treatment plan with the patient. The patient was provided an opportunity to ask questions and all were answered. The patient agreed with the plan and demonstrated an understanding of the instructions.   The patient was advised to call back or seek an in-person evaluation if the symptoms worsen or if the condition fails to improve as anticipated.  Cathlean Cower, MD

## 2021-04-11 ENCOUNTER — Encounter: Payer: Self-pay | Admitting: Internal Medicine

## 2021-04-11 DIAGNOSIS — U071 COVID-19: Secondary | ICD-10-CM | POA: Insufficient documentation

## 2021-04-11 NOTE — Assessment & Plan Note (Signed)
Pt encouraged to continue to monitor BP at home and next visit, cont same tx, zestoretic BP Readings from Last 3 Encounters:  01/23/21 132/70  01/09/21 (!) 142/81  11/16/20 138/80

## 2021-04-11 NOTE — Assessment & Plan Note (Signed)
Lab Results  Component Value Date   HGBA1C 6.4 01/23/2021   Stable, pt to continue current medical treatment  - diet

## 2021-04-11 NOTE — Assessment & Plan Note (Signed)
Recent onset less than 5 days in a higher risk patient o/w uncomplicated - for paxlovid course, and cough med prn,  to f/u any worsening symptoms or concerns

## 2021-04-19 DIAGNOSIS — N3040 Irradiation cystitis without hematuria: Secondary | ICD-10-CM | POA: Diagnosis not present

## 2021-04-19 DIAGNOSIS — N3000 Acute cystitis without hematuria: Secondary | ICD-10-CM | POA: Diagnosis not present

## 2021-04-19 DIAGNOSIS — R310 Gross hematuria: Secondary | ICD-10-CM | POA: Diagnosis not present

## 2021-04-19 DIAGNOSIS — N3020 Other chronic cystitis without hematuria: Secondary | ICD-10-CM | POA: Diagnosis not present

## 2021-05-15 ENCOUNTER — Encounter (HOSPITAL_BASED_OUTPATIENT_CLINIC_OR_DEPARTMENT_OTHER): Payer: Medicare Other | Attending: Internal Medicine | Admitting: Internal Medicine

## 2021-05-15 ENCOUNTER — Other Ambulatory Visit: Payer: Self-pay

## 2021-05-15 DIAGNOSIS — C61 Malignant neoplasm of prostate: Secondary | ICD-10-CM | POA: Diagnosis not present

## 2021-05-15 DIAGNOSIS — Y842 Radiological procedure and radiotherapy as the cause of abnormal reaction of the patient, or of later complication, without mention of misadventure at the time of the procedure: Secondary | ICD-10-CM | POA: Insufficient documentation

## 2021-05-15 DIAGNOSIS — N3040 Irradiation cystitis without hematuria: Secondary | ICD-10-CM | POA: Insufficient documentation

## 2021-05-15 DIAGNOSIS — R300 Dysuria: Secondary | ICD-10-CM

## 2021-05-15 NOTE — Progress Notes (Addendum)
WYLDER, MACOMBER ([id number]) Visit Report for 05/15/2021 Chief Complaint Document Details Patient Name: Date of Service: [name] [name] 05/15/2021 1:15 PM Medical Record Number: [id number] Patient Account Number: [id number] Date of Birth/Sex: Treating RN: 1950/06/27 (70 y.o. Erie Noe Primary Care Provider: [name] Other Clinician: Referring Provider: Treating Provider/Extender: [name]: 0 Information Obtained from: Patient Chief Complaint Discuss hyperbaric oxygen treatment for radiation cystitis Electronic Signature(s) Signed: 05/15/2021 4:19:59 PM By: [name] DO Entered By: [name] on 05/15/2021 15:07:28 -------------------------------------------------------------------------------- HPI Details Patient Name: Date of Service: [name] [name] 05/15/2021 1:15 PM Medical Record Number: [id number] Patient Account Number: [id number] Date of Birth/Sex: Treating RN: [birth year] (69 y.o. Erie Noe Primary Care Provider: [name] Other Clinician: Referring Provider: Treating Provider/Extender: [name]: 0 History of Present Illness HPI Description: Admission 05/15/2021 [name] [name] is a 70 year old male with a past medical history of prostate cancer status post radiation who was developed radiation cystitis. He states he has dysuria. He denies gross hematuria. He currently denies signs of infection. Patient's prostate cancer was diagnosed on 02/27/2017 by Dr. Link Snuffer. He had intensitymodulated radiation therapy from 09/09/2019 to 10/18/2019 for a total of 28 treatments. Following radiation thearpy he had a cystoscopy and that showed inflammation and edema at the bladder neck/prostate consistent with radiation cystitis. Electronic Signature(s) Signed: 05/15/2021 4:19:59 PM By: [name] DO Entered By: [name] on  05/15/2021 16:04:20 -------------------------------------------------------------------------------- Physical Exam Details Patient Name: Date of Service: [name] [name] 05/15/2021 1:15 PM Medical Record Number: [id number] Patient Account Number: [id number] Date of Birth/Sex: Treating RN: 1950-09-03 (70 y.o. Erie Noe Primary Care Provider: [name] Other Clinician: Referring Provider: Treating Provider/Extender: [name]: 0 Constitutional respirations regular, non-labored and within target range for patient.[name] Kitchen Respiratory normal breathing without difficulty. Cardiovascular regular rate and rhythm with normal S1, S2. Psychiatric pleasant and cooperative. Electronic Signature(s) Signed: 05/15/2021 4:19:59 PM By: [name] DO Entered By: [name] on 05/15/2021 16:06:47 -------------------------------------------------------------------------------- Physician Orders Details Patient Name: Date of Service: [name] [name] 05/15/2021 1:15 PM Medical Record Number: [id number] Patient Account Number: [id number] Date of Birth/Sex: Treating RN: April 19, 1951 (70 y.o. [name] [name] Primary Care Provider: [name] Other Clinician: Referring Provider: Treating Provider/Extender: [name]: 0 Verbal / Phone Orders: No Diagnosis Coding Follow-up Appointments ppointment in 2 weeks. - Dr. Heber [place] to see you before or after hyberbarics. Return A ***Hyberbaric oxygen T Ronalee Belts will call you once insurance as approved to get you scheduled for daily treatments for hyberbarics.*** ech Hyperbaric Oxygen Therapy Evaluate for HBO Therapy Indication: - Radiation Cystitis If appropriate for treatment, begin HBOT per protocol: 2.5 ATA for 90 Minutes with 2 Five (5) Minute A Breaks ir Total Number of Treatments: - 40 One treatments per day (delivered Monday through Friday  unless otherwise specified in Special Instructions below): A frin (Oxymetazoline HCL) 0.05% nasal spray - 1 spray in both nostrils daily as needed prior to HBO treatment for difficulty clearing ears Radiology X-ray, Chest - Chest x-ray for hyberbaric protocol. ICD 10 code I10 CPT code [employer] EKG - EKG for hyberbaric protocol. ICD 10 code I10 CPT code Electronic Signature(s) Signed: 05/15/2021 4:19:59 PM By: [name] DO Entered By: [name] on 05/15/2021 16:08:47 Prescription 05/15/2021 -------------------------------------------------------------------------------- Robinette Haines DO Patient Name: Provider: 18-Sep-1950 [id number] Date of Birth: NPI#: [id number] Sex: DEA #: [phone] Phone #: License #: [place] Patient Address: [address] [place], [place] [address] [place], [place] [id number] [phone] Allergies aspirin; Statins-Hmg-Coa Reductase Inhibitors; Gadolinium-Containing Contrast Media Provider's Orders X-ray, Chest - Chest x-ray for hyberbaric protocol. ICD 10 code I10 CPT code Hand Signature: Date(s): Prescription 05/15/2021 Robinette Haines DO Patient Name: Provider: 07/09/1950 [id number] Date of Birth: NPI#: [name] [id number] Sex: DEA #: [phone] Phone #: License #: [place] Patient Address: [address] [place], [place] [address] [place], [place] [address] [phone] Allergies aspirin; Statins-Hmg-Coa Reductase Inhibitors; Gadolinium-Containing Contrast Media Provider's Orders EKG - EKG for hyberbaric protocol. ICD 10 code I10 CPT code Hand Signature: Date(s): Electronic Signature(s) Signed: 05/15/2021 4:19:59 PM By: [name] DO Entered By: [name] on 05/15/2021  16:08:47 -------------------------------------------------------------------------------- Problem List Details Patient Name: Date of Service: [name] [name] 05/15/2021 1:15 PM Medical Record Number: [id number] Patient Account Number: [id number] Date of Birth/Sex: Treating RN: Dec 29, 1950 (69 y.o. Erie Noe Primary Care Provider: [name] Other Clinician: Referring Provider: Treating Provider/Extender: [name]: 0 Active Problems ICD-10 Encounter Code Description Active Date MDM Diagnosis N30.40 Irradiation cystitis without hematuria 05/15/2021 No Yes C61 Malignant neoplasm of prostate 05/15/2021 No Yes R30.0 Dysuria 05/15/2021 No Yes Z00.00 Encounter for general adult medical examination without abnormal findings 05/15/2021 No Yes Inactive Problems Resolved Problems Electronic Signature(s) Signed: 05/21/2021 11:53:07 AM By: [name] DO Previous Signature: 05/15/2021 4:19:59 PM Version By: [name] DO Entered By: [name] on 05/21/2021 11:52:25 -------------------------------------------------------------------------------- Progress Note Details Patient Name: Date of Service: [name] 05/15/2021 1:15 PM Medical Record Number: [id number] Patient Account Number: [id number] Date of Birth/Sex: Treating RN: 02-Dec-1950 (70 y.o. Erie Noe Primary Care Provider: [name] Other Clinician: Referring Provider: Treating Provider/Extender: [name]: 0 Subjective Chief Complaint Information obtained from Patient Discuss hyperbaric oxygen treatment for radiation cystitis History of Present Illness (HPI) Admission 05/15/2021 Mr. Kazuo Durnil is a 71 year old male with a past medical history of prostate cancer status post radiation who was developed radiation cystitis. He states he has dysuria. He denies gross hematuria. He  currently denies signs of infection. Patient's prostate cancer was diagnosed on 02/27/2017 by Dr. Link Snuffer. He had intensityoomodulated radiation therapy from 09/09/2019 to 10/18/2019 for a total of 28 treatments. Following radiation thearpy he had a cystoscopy and that showed inflammation and edema at the bladder neck/prostate consistent with radiation cystitis. Patient History Information obtained from Patient. Allergies aspirin (Severity: Mild, Reaction: upset stomach), Statins-Hmg-Coa Reductase Inhibitors (Severity: Mild, Reaction: upset stomach), Gadolinium-Containing Contrast Media (Reaction: N/V multiple times) Family History Cancer - Mother,Father, Diabetes - Mother,Siblings, No family history of Heart Disease, Hereditary Spherocytosis, Hypertension, Kidney Disease, Lung Disease, Seizures, Stroke, Thyroid Problems, Tuberculosis. Social History Never smoker, Marital Status - Married, Alcohol Use - Never, Drug Use - No History, Caffeine Use - Rarely. Medical History Eyes Denies history of Cataracts, Glaucoma, Optic Neuritis Ear/Nose/Mouth/Throat Denies history of Chronic sinus problems/congestion, Middle ear problems Hematologic/Lymphatic Denies history of Anemia, Hemophilia, Human Immunodeficiency Virus, Lymphedema, Sickle Cell Disease Respiratory Denies history of Aspiration, Asthma, Chronic Obstructive Pulmonary Disease (COPD), Pneumothorax, Sleep Apnea, Tuberculosis Cardiovascular Patient has history of Hypertension Denies history of Angina, Arrhythmia, Congestive Heart Failure, Coronary Artery Disease, Deep Vein Thrombosis, Hypotension, Myocardial Infarction, Peripheral  Arterial Disease, Peripheral Venous Disease, Phlebitis, Vasculitis Gastrointestinal Denies history of Cirrhosis , Colitis, Crohnoos, Hepatitis A, Hepatitis B, Hepatitis C Endocrine Denies history of Type I Diabetes, Type II Diabetes Genitourinary Denies history of End Stage Renal  Disease Immunological Denies history of Lupus Erythematosus, Raynaudoos, Scleroderma Integumentary (Skin) Denies history of History of Burn Musculoskeletal Denies history of Gout, Rheumatoid Arthritis, Osteoarthritis, Osteomyelitis Neurologic Denies history of Dementia, Neuropathy, Quadriplegia, Paraplegia, Seizure Disorder Oncologic Patient has history of Received Radiation - 28 treatments 2020 Prostate Ca Denies history of Received Chemotherapy Psychiatric Patient has history of Confinement Anxiety Denies history of Anorexia/bulimia Hospitalization/Surgery History - left ankle fusion 1991. - hernia repair umbilical 15 years ago. Medical A Surgical History Notes nd Cardiovascular hyperlipidemia Gastrointestinal Colon Polyps diverticulosis Genitourinary hematuria radiation cystitis- currently Review of Systems (ROS) Constitutional Symptoms (General Health) Denies complaints or symptoms of Fatigue, Fever, Chills, Marked Weight Change. Eyes Complains or has symptoms of Glasses / Contacts - reading glasses. Denies complaints or symptoms of Dry Eyes, Vision Changes. Ear/Nose/Mouth/Throat Denies complaints or symptoms of Chronic sinus problems or rhinitis. Respiratory Denies complaints or symptoms of Chronic or frequent coughs, Shortness of Breath. Cardiovascular Denies complaints or symptoms of Chest pain. Gastrointestinal Denies complaints or symptoms of Frequent diarrhea, Nausea, Vomiting. Endocrine Denies complaints or symptoms of Heat/cold intolerance. Genitourinary Denies complaints or symptoms of Frequent urination. Integumentary (Skin) Denies complaints or symptoms of Wounds. Musculoskeletal Denies complaints or symptoms of Muscle Pain, Muscle Weakness. Neurologic Denies complaints or symptoms of Numbness/parasthesias. Psychiatric Denies complaints or symptoms of Claustrophobia, Suicidal. Objective Constitutional respirations regular, non-labored and within  target range for patient.. Vitals Time Taken: 1:25 PM, Height: 74 in, Source: Stated, Weight: 315 lbs, Source: Stated, BMI: 40.4, Temperature: 98.4 F, Pulse: 73 bpm, Respiratory Rate: 20 breaths/min, Blood Pressure: 142/92 mmHg. Respiratory normal breathing without difficulty. Cardiovascular regular rate and rhythm with normal S1, S2. Psychiatric pleasant and cooperative. Assessment Active Problems ICD-10 Irradiation cystitis without hematuria Malignant neoplasm of prostate Dysuria Patient has a history of prostate cancer status post 28 treatments of radiation from 09/09/2019 to 10/18/2019. He subsequently had a cystoscopy November 2022 that showed inflammation and edema at the bladder neck/prostate consistent with radiation cystitis. He states his main symptom is dysuria and he has had hematuria that waxes and wanes. He currently denies gross hematuria. He would benefit from hyperbaric oxygen therapy, 40 treatments at 2.5 ATA for 18min and two 5 min air breaks. We will assess improvement from HBO based on symptoms including hematuria and dysuria. We will order an EKG and a chest x-ray prior to treatment. He also suffers from claustrophobia and I have prescribed Ativan 1 mg to take once before each session. I gave him a total of 10 pills to start with. 36 mintues was spent on the encounter including face to face, EMR review and coordination of care Plan Follow-up Appointments: Return Appointment in 2 weeks. - Dr. Heber [place] to see you before or after hyberbarics. ***Hyberbaric oxygen T Ronalee Belts will call you once insurance as ech approved to get you scheduled for daily treatments for hyberbarics.*** Hyperbaric Oxygen Therapy: Evaluate for HBO Therapy Indication: - Radiation Cystitis If appropriate for treatment, begin HBOT per protocol: 2.5 ATA for 90 Minutes with 2 Five (5) Minute Air Breaks T Number of Treatments: - 40 otal One treatments per day (delivered Monday through Friday unless  otherwise specified in Special Instructions below): Afrin (Oxymetazoline HCL) 0.05% nasal spray - 1 spray in both nostrils daily as needed prior to HBO  treatment for difficulty clearing ears Radiology ordered were: X-ray, Chest - Chest x-ray for hyberbaric protocol. ICD 10 code I10 CPT code ordered were: EKG - EKG for hyberbaric protocol. ICD 10 code I10 CPT code 1. Order for approval for HBO therapy in the setting of radiation cystitis 2. EKG and Chest x-ray Electronic Signature(s) Signed: 05/15/2021 4:19:59 PM By: [name] DO Entered By: [name] on 05/15/2021 16:18:46 -------------------------------------------------------------------------------- HxROS Details Patient Name: Date of Service: [name] [name] 05/15/2021 1:15 PM Medical Record Number: [id number] Patient Account Number: [id number] Date of Birth/Sex: Treating RN: 06-Jan-1951 (70 y.o. [name] [name] Primary Care Provider: [name] Other Clinician: Referring Provider: Treating Provider/Extender: [name]: 0 Information Obtained From Patient Constitutional Symptoms (General Health) Complaints and Symptoms: Negative for: Fatigue; Fever; Chills; Marked Weight Change Eyes Complaints and Symptoms: Positive for: Glasses / Contacts - reading glasses Negative for: Dry Eyes; Vision Changes Medical History: Negative for: Cataracts; Glaucoma; Optic Neuritis Ear/Nose/Mouth/Throat Complaints and Symptoms: Negative for: Chronic sinus problems or rhinitis Medical History: Negative for: Chronic sinus problems/congestion; Middle ear problems Respiratory Complaints and Symptoms: Negative for: Chronic or frequent coughs; Shortness of Breath Medical History: Negative for: Aspiration; Asthma; Chronic Obstructive Pulmonary Disease (COPD); Pneumothorax; Sleep Apnea; Tuberculosis Cardiovascular Complaints and Symptoms: Negative for: Chest pain Medical  History: Positive for: Hypertension Negative for: Angina; Arrhythmia; Congestive Heart Failure; Coronary Artery Disease; Deep Vein Thrombosis; Hypotension; Myocardial Infarction; Peripheral Arterial Disease; Peripheral Venous Disease; Phlebitis; Vasculitis Past Medical History Notes: hyperlipidemia Gastrointestinal Complaints and Symptoms: Negative for: Frequent diarrhea; Nausea; Vomiting Medical History: Negative for: Cirrhosis ; Colitis; Crohns; Hepatitis A; Hepatitis B; Hepatitis C Past Medical History Notes: Colon Polyps diverticulosis Endocrine Complaints and Symptoms: Negative for: Heat/cold intolerance Medical History: Negative for: Type I Diabetes; Type II Diabetes Genitourinary Complaints and Symptoms: Negative for: Frequent urination Medical History: Negative for: End Stage Renal Disease Past Medical History Notes: hematuria radiation cystitis- currently Integumentary (Skin) Complaints and Symptoms: Negative for: Wounds Medical History: Negative for: History of Burn Musculoskeletal Complaints and Symptoms: Negative for: Muscle Pain; Muscle Weakness Medical History: Negative for: Gout; Rheumatoid Arthritis; Osteoarthritis; Osteomyelitis Neurologic Complaints and Symptoms: Negative for: Numbness/parasthesias Medical History: Negative for: Dementia; Neuropathy; Quadriplegia; Paraplegia; Seizure Disorder Psychiatric Complaints and Symptoms: Negative for: Claustrophobia; Suicidal Medical History: Positive for: Confinement Anxiety Negative for: Anorexia/bulimia Hematologic/Lymphatic Medical History: Negative for: Anemia; Hemophilia; Human Immunodeficiency Virus; Lymphedema; Sickle Cell Disease Immunological Medical History: Negative for: Lupus Erythematosus; Raynauds; Scleroderma Oncologic Medical History: Positive for: Received Radiation - 28 treatments 2020 Prostate Ca Negative for: Received Chemotherapy Immunizations Pneumococcal Vaccine: Received  Pneumococcal Vaccination: Yes Received Pneumococcal Vaccination On or After 60th Birthday: Yes Implantable Devices No devices added Hospitalization / Surgery History Type of Hospitalization/Surgery left ankle fusion 1991 hernia repair umbilical 15 years ago Family and Social History Cancer: Yes - Mother,Father; Diabetes: Yes - Mother,Siblings; Heart Disease: No; Hereditary Spherocytosis: No; Hypertension: No; Kidney Disease: No; Lung Disease: No; Seizures: No; Stroke: No; Thyroid Problems: No; Tuberculosis: No; Never smoker; Marital Status - Married; Alcohol Use: Never; Drug Use: No History; Caffeine Use: Rarely; Financial Concerns: No; Food, Clothing or Shelter Needs: No; Support System Lacking: No; Transportation Concerns: No Electronic Signature(s) Signed: 05/15/2021 4:19:59 PM By: [name] DO Signed: 05/15/2021 5:00:25 PM By: Deon Pilling RN, BSN Entered By: Deon Pilling on 05/15/2021 13:42:27 -------------------------------------------------------------------------------- SuperBill Details Patient Name: Date of Service: [name] [name] 05/15/2021 Medical Record Number: [id number] Patient Account Number: [id number] Date of Birth/Sex:  Treating RN: April 06, 1951 (70 y.o. [name] [name] Primary Care Provider: [name] Other Clinician: Referring Provider: Treating Provider/Extender: [name]: 0 Diagnosis Coding ICD-10 Codes Code Description N30.40 Irradiation cystitis without hematuria C61 Malignant neoplasm of prostate R30.0 Dysuria Facility Procedures CPT4 Code: [id number] Description: 12258 - WOUND CARE VISIT-LEV 3 EST PT Modifier: Quantity: 1 Physician Procedures : CPT4 Code Description Modifier [id number] - WC PHYS LEVEL 4 - NEW PT ICD-10 Diagnosis Description N30.40 Irradiation cystitis without hematuria C61 Malignant neoplasm of prostate R30.0 Dysuria Quantity: 1 Electronic Signature(s) Signed: 05/15/2021  4:19:59 PM By: [name] DO Entered By: [name] on 05/15/2021 16:18:52

## 2021-05-15 NOTE — Progress Notes (Signed)
[name] [name] ([id number]) Visit Report for 05/15/2021 Abuse/Suicide Risk Screen Details Patient Name: Date of Service: [name] [name] 05/15/2021 1:15 PM Medical Record Number: [id number] Patient Account Number: [id number] Date of Birth/Sex: Treating RN: [birth year] (70 y.o. [name] [name] Primary Care [name] [name]: [name] [name] [name] [name]: Referring [name] [name]: Treating [name] [name]: [name] [name] Weeks in Treatment: 0 Abuse/Suicide Risk Screen Items Answer ABUSE RISK SCREEN: Has anyone close to you tried to hurt or harm you recentlyo No Do you feel uncomfortable with anyone in your familyo No Has anyone forced you do things that you didnt want to doo No Electronic Signature(s) Signed: 05/15/2021 5:00:25 PM By: [name] Pilling RN, BSN Entered By: [name] [name] on 05/15/2021 13:36:23 -------------------------------------------------------------------------------- Activities of Daily Living Details Patient Name: Date of Service: [name] [name] 05/15/2021 1:15 PM Medical Record Number: [id number] Patient Account Number: [id number] Date of Birth/Sex: Treating RN: [name] [name] (70 y.o. [name] [name] Primary Care [name] [name]: [name] [name] [name] [name]: Referring [name] [name]: Treating [name] [name]: [name] [name] Weeks in Treatment: 0 Activities of Daily Living Items Answer Activities of Daily Living (Please select one for each item) Drive Automobile Completely Able T Medications ake Completely Able Use T elephone Completely Able Care for Appearance Completely Able Use T oilet Completely Able Bath / Shower Completely Able Dress Self Completely Able Feed Self Completely Able Walk Completely Able Get In / Out Bed Completely Able Housework Completely Able Prepare Meals Completely [name] [name] for Self Completely Able Electronic Signature(s) Signed: 05/15/2021 5:00:25 PM By: [name] Pilling RN,  BSN Entered By: [name] [name] on 05/15/2021 13:36:40 -------------------------------------------------------------------------------- Education Screening Details Patient Name: Date of Service: [name] [name] 05/15/2021 1:15 PM Medical Record Number: [id number] Patient Account Number: [id number] Date of Birth/Sex: Treating RN: [birth year] (70 y.o. [name] [name] Primary Care Laquitta Dominski: [name] [name] [name] [name]: Referring [name] [name]: Treating [name] [name]/Extender: [name] [name] in Treatment: 0 Primary Learner Assessed: Patient Learning Preferences/Education Level/Primary Language Learning Preference: Explanation, Demonstration, Printed Material Highest Education Level: High School Preferred Language: English Cognitive Barrier Language Barrier: No Translator Needed: No Memory Deficit: No Emotional Barrier: No Cultural/Religious Beliefs Affecting Medical Care: No Physical Barrier Impaired Vision: Yes Glasses, reading Impaired Hearing: No Decreased Hand dexterity: No Knowledge/Comprehension Knowledge Level: High Comprehension Level: High Ability to understand written instructions: High Ability to understand verbal instructions: High Motivation Anxiety Level: Calm Cooperation: Cooperative Education Importance: Acknowledges Need Interest in Health Problems: Asks Questions Perception: Coherent Willingness to Engage in Self-Management High Activities: Readiness to Engage in Self-Management High Activities: Electronic Signature(s) Signed: 05/15/2021 5:00:25 PM By: [name] Pilling RN, BSN Entered By: [name] [name] on 05/15/2021 13:37:03 -------------------------------------------------------------------------------- Fall Risk Assessment Details Patient Name: Date of Service: [name] [name] 05/15/2021 1:15 PM Medical Record Number: [id number] Patient Account Number: [id number] Date of Birth/Sex: Treating RN: [name] (70 y.o. [name] [name] Primary Care [name] [name]: [name] [name] [name] [name]: Referring Jameika Kinn: Treating [name] [name]: [name] [name] Weeks in Treatment: 0 Fall Risk Assessment Items Have you had 2 or more falls in the last 12 monthso 0 No Have you had any fall that resulted in injury in the last 12 monthso 0 No FALLS RISK SCREEN History of falling - immediate or within 3 months 0 No Secondary diagnosis (Do you have 2 or more medical diagnoseso) 0 No Ambulatory aid None/bed rest/wheelchair/nurse 0 Yes Crutches/cane/walker 0 No Furniture 0 No Intravenous therapy Access/Saline/Heparin Lock 0 No Gait/Transferring Normal/ bed rest/ wheelchair 0  Yes Weak (short steps with or without shuffle, stooped but able to lift head while walking, may seek 0 No support from furniture) Impaired (short steps with shuffle, may have difficulty arising from chair, head down, impaired 0 No balance) Mental Status Oriented to own ability 0 Yes Electronic Signature(s) Signed: 05/15/2021 5:00:25 PM By: [name] Pilling RN, BSN Entered By: [name] [name] on 05/15/2021 13:37:14 -------------------------------------------------------------------------------- Nutrition Risk Screening Details Patient Name: Date of Service: [name] [name] 05/15/2021 1:15 PM Medical Record Number: [id number] Patient Account Number: [id number] Date of Birth/Sex: Treating RN: [name] (70 y.o. [name] [name] Primary Care [name] [name]: [name] [name] [name] [name]: Referring [name] [name]: Treating [name] [name]: [name] [name] Weeks in Treatment: 0 Height (in): 74 Weight (lbs): 315 Body Mass Index (BMI): 40.4 Nutrition Risk Screening Items Score Screening NUTRITION RISK SCREEN: I have an illness or condition that made me change the kind and/or amount of food I eat 2 Yes I eat fewer than two meals per day 0 No I eat few fruits and vegetables, or milk products 0 No I have three or more  drinks of beer, liquor or wine almost every day 0 No I have tooth or mouth problems that make it hard for me to eat 0 No I don't always have enough money to buy the food I need 0 No I eat alone most of the time 0 No I take three or more different prescribed or over-the-counter drugs a day 1 Yes Without wanting to, I have lost or gained 10 pounds in the last six months 0 No I am not always physically able to shop, cook and/or feed myself 0 No Nutrition Protocols Good Risk Protocol Moderate Risk Protocol 0 Provide education on nutrition High Risk Proctocol Risk Level: Moderate Risk Score: 3 Electronic Signature(s) Signed: 05/15/2021 5:00:25 PM By: [name] Pilling RN, BSN Entered By: [name] [name] on 05/15/2021 13:38:41

## 2021-05-16 ENCOUNTER — Ambulatory Visit (HOSPITAL_COMMUNITY)
Admission: RE | Admit: 2021-05-16 | Discharge: 2021-05-16 | Disposition: A | Discharge status: Home / Self Care | Payer: Medicare Other | Source: Ambulatory Visit | Attending: Cardiology | Admitting: Cardiology

## 2021-05-16 ENCOUNTER — Other Ambulatory Visit (HOSPITAL_COMMUNITY): Payer: Self-pay | Admitting: Internal Medicine

## 2021-05-16 DIAGNOSIS — Z9289 Personal history of other medical treatment: Secondary | ICD-10-CM | POA: Diagnosis not present

## 2021-05-16 DIAGNOSIS — I10 Essential (primary) hypertension: Secondary | ICD-10-CM | POA: Insufficient documentation

## 2021-05-16 NOTE — Progress Notes (Signed)
[name] [name] ([id number]) Visit Report for 05/15/2021 Allergy List Details Patient Name: Date of Service: [name] [name] 05/15/2021 1:15 PM Medical Record Number: [id number] Patient Account Number: [id number] Date of Birth/Sex: Treating RN: [birth year] (70 y.o. [name] [name] Primary Care Frieda Arnall: [name] Other Clinician: Referring Saahil Herbster: Treating Nadina Fomby/Extender: [name]: 0 Allergies Active Allergies aspirin Reaction: upset stomach Severity: Mild Statins-Hmg-Coa Reductase Inhibitors Reaction: upset stomach Severity: Mild Gadolinium-Containing Contrast Media Reaction: N/V multiple times Allergy Notes Electronic Signature(s) Signed: 05/15/2021 5:00:25 PM By: Deon Pilling RN, BSN Entered By: Deon Pilling on 05/15/2021 13:29:51 -------------------------------------------------------------------------------- Arrival Information Details Patient Name: Date of Service: [name] [name] 05/15/2021 1:15 PM Medical Record Number: [id number] Patient Account Number: [id number] Date of Birth/Sex: Treating RN: 08/16/1950 (70 y.o. [name] [name] Primary Care Shalea Tomczak: [name] Other Clinician: Referring Poppi Scantling: Treating Jacqeline Broers/Extender: [name] in Treatment: 0 Visit Information Patient Arrived: Ambulatory Arrival Time: 13:21 Accompanied By: self Transfer Assistance: None Patient Identification Verified: Yes Secondary Verification Process Completed: Yes Patient Requires Transmission-Based Precautions: No Patient Has Alerts: No Electronic Signature(s) Signed: 05/15/2021 5:00:25 PM By: Deon Pilling RN, BSN Entered By: Deon Pilling on 05/15/2021 13:26:42 -------------------------------------------------------------------------------- Clinic Level of Care Assessment Details Patient Name: Date of Service: [name] [name] 05/15/2021 1:15 PM Medical Record Number:  [id number] Patient Account Number: [id number] Date of Birth/Sex: Treating RN: Jul 19, 1950 (70 y.o. [name] [name] Primary Care Debarah Mccumbers: [name] Other Clinician: Referring Cordera Stineman: Treating Bradford Cazier/Extender: [name]: 0 Clinic Level of Care Assessment Items TOOL 2 Quantity Score X- 1 0 Use when only an EandM is performed on the INITIAL visit ASSESSMENTS - Nursing Assessment / Reassessment X- 1 20 General Physical Exam (combine w/ comprehensive assessment (listed just below) when performed on new pt. evals) X- 1 25 Comprehensive Assessment (HX, ROS, Risk Assessments, Wounds Hx, etc.) ASSESSMENTS - Wound and Skin A ssessment / Reassessment []  - 0 Simple Wound Assessment / Reassessment - one wound []  - 0 Complex Wound Assessment / Reassessment - multiple wounds []  - 0 Dermatologic / Skin Assessment (not related to wound area) ASSESSMENTS - Ostomy and/or Continence Assessment and Care []  - 0 Incontinence Assessment and Management []  - 0 Ostomy Care Assessment and Management (repouching, etc.) PROCESS - Coordination of Care X - Simple Patient / Family Education for ongoing care 1 15 []  - 0 Complex (extensive) Patient / Family Education for ongoing care X- 1 10 Staff obtains Programmer, systems, Records, T Results / Process Orders est []  - 0 Staff telephones HHA, Nursing Homes / Clarify orders / etc []  - 0 Routine Transfer to another Facility (non-emergent condition) []  - 0 Routine Hospital Admission (non-emergent condition) []  - 0 New Admissions / Biomedical engineer / Ordering NPWT Apligraf, etc. , []  - 0 Emergency Hospital Admission (emergent condition) X- 1 10 Simple Discharge Coordination []  - 0 Complex (extensive) Discharge Coordination PROCESS - Special Needs []  - 0 Pediatric / Minor Patient Management []  - 0 Isolation Patient Management []  - 0 Hearing / Language / Visual special needs []  - 0 Assessment of  Community assistance (transportation, [name]/C planning, etc.) []  - 0 Additional assistance / Altered mentation []  - 0 Support Surface(s) Assessment (bed, cushion, seat, etc.) INTERVENTIONS - Wound Cleansing / Measurement []  - 0 Wound Imaging (photographs - any number of wounds) []  - 0 Wound Tracing (instead of photographs) []  - 0 Simple Wound Measurement - one wound []  -  0 Complex Wound Measurement - multiple wounds []  - 0 Simple Wound Cleansing - one wound []  - 0 Complex Wound Cleansing - multiple wounds INTERVENTIONS - Wound Dressings []  - 0 Small Wound Dressing one or multiple wounds []  - 0 Medium Wound Dressing one or multiple wounds []  - 0 Large Wound Dressing one or multiple wounds []  - 0 Application of Medications - injection INTERVENTIONS - Miscellaneous []  - 0 External ear exam []  - 0 Specimen Collection (cultures, biopsies, blood, body fluids, etc.) []  - 0 Specimen(s) / Culture(s) sent or taken to Lab for analysis []  - 0 Patient Transfer (multiple staff / Civil Service fast streamer / Similar devices) []  - 0 Simple Staple / Suture removal (25 or less) []  - 0 Complex Staple / Suture removal (26 or more) []  - 0 Hypo / Hyperglycemic Management (close monitor of Blood Glucose) []  - 0 Ankle / Brachial Index (ABI) - do not check if billed separately Has the patient been seen at the hospital within the last three years: Yes Total Score: 80 Level Of Care: New/Established - Level 3 Electronic Signature(s) Signed: 05/15/2021 5:00:25 PM By: Deon Pilling RN, BSN Entered By: Deon Pilling on 05/15/2021 13:55:54 -------------------------------------------------------------------------------- Encounter Discharge Information Details Patient Name: Date of Service: [name] [name] 05/15/2021 1:15 PM Medical Record Number: [id number] Patient Account Number: [id number] Date of Birth/Sex: Treating RN: Jun 06, 1951 (70 y.o. [name] [name] Primary Care Vitaliy Eisenhour: [name] Other  Clinician: Referring Jamani Eley: Treating Codi Folkerts/Extender: [name] in Treatment: 0 Encounter Discharge Information Items Discharge Condition: Stable Ambulatory Status: Ambulatory Discharge Destination: Home Transportation: Private Auto Accompanied By: self Schedule Follow-up Appointment: No Clinical Summary of Care: Notes Sent to HBO for orientation. Electronic Signature(s) Signed: 05/15/2021 5:00:25 PM By: Deon Pilling RN, BSN Entered By: Deon Pilling on 05/15/2021 14:05:41 -------------------------------------------------------------------------------- Multi Wound Chart Details Patient Name: Date of Service: [name] [name] 05/15/2021 1:15 PM Medical Record Number: [id number] Patient Account Number: [id number] Date of Birth/Sex: Treating RN: 12/31/1950 (70 y.o. Erie Noe Primary Care Jeiry Birnbaum: [name] Other Clinician: Referring Keniah Klemmer: Treating Kervens Roper/Extender: [name]: 0 Vital Signs Height(in): 74 Pulse(bpm): 73 Weight(lbs): 315 Blood Pressure(mmHg): 142/92 Body Mass Index(BMI): 40 Temperature(F): 98.4 Respiratory Rate(breaths/min): 20 Wound Assessments Treatment Notes Electronic Signature(s) Signed: 05/15/2021 4:19:59 PM By: [name] DO Signed: 05/16/2021 4:34:42 PM By: Rhae Hammock RN Entered By: [name] on 05/15/2021 15:07:04 -------------------------------------------------------------------------------- Multi-Disciplinary Care Plan Details Patient Name: Date of Service: [name] [name] 05/15/2021 1:15 PM Medical Record Number: [id number] Patient Account Number: [id number] Date of Birth/Sex: Treating RN: August 06, 1950 (70 y.o. [name] [name] Primary Care Eliezer Khawaja: [name] Other Clinician: Referring Mareesa Gathright: Treating Alisha Burgo/Extender: [name]: 0 Active  Inactive [facility] Nursing Diagnoses: Anxiety related to feelings of confinement associated with the hyperbaric oxygen chamber Anxiety related to knowledge deficit of hyperbaric oxygen therapy and treatment procedures Goals: Patient and/or family will be able to state/discuss factors appropriate to the management of their disease process during treatment Date Initiated: 05/15/2021 Target Resolution Date: 06/15/2021 Goal Status: Active Patient/caregiver will verbalize understanding of HBO goals, rationale, procedures and potential hazards Date Initiated: 05/15/2021 Target Resolution Date: 06/15/2021 Goal Status: Active Interventions: Administer decongestants, per physician orders, prior to HBO2 Assess and provide for patients comfort related to the hyperbaric environment and equalization of middle ear Assess for signs and symptoms related to adverse events, including but not limited to confinement anxiety, pneumothorax, oxygen toxicity and  baurotrauma Notes: Orientation to the Wound Care Program Nursing Diagnoses: Knowledge deficit related to the wound healing center program Goals: Patient/caregiver will verbalize understanding of the [place] Date Initiated: 05/15/2021 Target Resolution Date: 06/15/2021 Goal Status: Active Interventions: Provide education on orientation to the wound center Notes: Electronic Signature(s) Signed: 05/15/2021 5:00:25 PM By: Deon Pilling RN, BSN Entered By: Deon Pilling on 05/15/2021 13:54:50 -------------------------------------------------------------------------------- Pain Assessment Details Patient Name: Date of Service: [name] [name] 05/15/2021 1:15 PM Medical Record Number: [id number] Patient Account Number: [id number] Date of Birth/Sex: Treating RN: 11/23/1950 (70 y.o. [name] [name] Primary Care Zelma Snead: [name] Other Clinician: Referring Hildegarde Dunaway: Treating Lourene Hoston/Extender: [name]: 0 Active Problems Location of Pain Severity and Description of Pain Patient Has Paino No Site Locations Rate the pain. Current Pain Level: 0 Pain Management and Medication Current Pain Management: Medication: No Cold Application: No Rest: No Massage: No Activity: No T.E.N.S.: No Heat Application: No Leg drop or elevation: No Is the Current Pain Management Adequate: Adequate How does your wound impact your activities of daily livingo Sleep: No Bathing: No Appetite: No Relationship With Others: No Bladder Continence: No Emotions: No Bowel Continence: No Work: No Toileting: No Drive: No Dressing: No Hobbies: No Engineer, maintenance) Signed: 05/15/2021 5:00:25 PM By: Deon Pilling RN, BSN Entered By: Deon Pilling on 05/15/2021 13:38:51 -------------------------------------------------------------------------------- Patient/Caregiver Education Details Patient Name: Date of Service: [name] [name] 12/6/2022andnbsp1:15 PM Medical Record Number: [id number] Patient Account Number: [id number] Date of Birth/Gender: Treating RN: 07/05/1950 (70 y.o. [name] [name] Primary Care Physician: [name] Other Clinician: Referring Physician: Treating Physician/Extender: [name] in Treatment: 0 Education Assessment Education Provided To: Patient Education Topics Provided Hyperbaric Oxygenation: Handouts: Hyperbaric Oxygen Methods: Explain/Verbal, Printed Responses: Reinforcements needed [place]: o Handouts: Welcome T [facility] o Methods: Explain/Verbal, Printed Responses: Reinforcements needed Electronic Signature(s) Signed: 05/15/2021 5:00:25 PM By: Deon Pilling RN, BSN Entered By: Deon Pilling on 05/15/2021 13:55:26 -------------------------------------------------------------------------------- Vitals Details Patient Name: Date of Service: [name] [name]  [name] 05/15/2021 1:15 PM Medical Record Number: [id number] Patient Account Number: [id number] Date of Birth/Sex: Treating RN: 11-16-1950 (70 y.o. [name] [name] Primary Care Melville Engen: [name] Other Clinician: Referring Darnita Woodrum: Treating Satya Buttram/Extender: [name]: 0 Vital Signs Time Taken: 13:25 Temperature (F): 98.4 Height (in): 74 Pulse (bpm): 73 Source: Stated Respiratory Rate (breaths/min): 20 Weight (lbs): 315 Blood Pressure (mmHg): 142/92 Source: Stated Reference Range: 80 - 120 mg / dl Body Mass Index (BMI): 40.4 Electronic Signature(s) Signed: 05/15/2021 5:00:25 PM By: Deon Pilling RN, BSN Entered By: Deon Pilling on 05/15/2021 13:29:05

## 2021-05-21 ENCOUNTER — Other Ambulatory Visit: Payer: Self-pay

## 2021-05-21 ENCOUNTER — Other Ambulatory Visit (HOSPITAL_COMMUNITY): Payer: Self-pay | Admitting: Internal Medicine

## 2021-05-21 ENCOUNTER — Ambulatory Visit (HOSPITAL_COMMUNITY)
Admission: RE | Admit: 2021-05-21 | Discharge: 2021-05-21 | Disposition: A | Discharge status: Home / Self Care | Payer: Medicare Other | Source: Ambulatory Visit | Attending: Internal Medicine | Admitting: Internal Medicine

## 2021-05-21 DIAGNOSIS — Z0000 Encounter for general adult medical examination without abnormal findings: Secondary | ICD-10-CM | POA: Insufficient documentation

## 2021-05-21 DIAGNOSIS — Z87448 Personal history of other diseases of urinary system: Secondary | ICD-10-CM | POA: Diagnosis not present

## 2021-05-28 ENCOUNTER — Encounter (HOSPITAL_BASED_OUTPATIENT_CLINIC_OR_DEPARTMENT_OTHER): Payer: Medicare Other | Admitting: Internal Medicine

## 2021-05-29 ENCOUNTER — Other Ambulatory Visit: Payer: Self-pay

## 2021-05-29 ENCOUNTER — Encounter (HOSPITAL_BASED_OUTPATIENT_CLINIC_OR_DEPARTMENT_OTHER): Payer: Medicare Other | Admitting: Internal Medicine

## 2021-05-29 DIAGNOSIS — N3040 Irradiation cystitis without hematuria: Secondary | ICD-10-CM

## 2021-05-29 DIAGNOSIS — R300 Dysuria: Secondary | ICD-10-CM

## 2021-05-29 DIAGNOSIS — C61 Malignant neoplasm of prostate: Secondary | ICD-10-CM | POA: Diagnosis not present

## 2021-05-29 DIAGNOSIS — L598 Other specified disorders of the skin and subcutaneous tissue related to radiation: Secondary | ICD-10-CM | POA: Diagnosis not present

## 2021-05-29 NOTE — Progress Notes (Signed)
RAVIS, HERNE ([id number]) Visit Report for 05/29/2021 Arrival Information Details Patient Name: Date of Service: [name] [name] 05/29/2021 11:00 A M Medical Record Number: [id number] Patient Account Number: [id number] Date of Birth/Sex: Treating RN: [birth year] (70 y.o. [name] Primary Care Kishana Battey: [name] Other Clinician: Donavan Burnet Referring Hymen Arnett: Treating Kahli Fitzgerald/Extender: [name] in Treatment: 2 Visit Information History Since Last Visit All ordered tests and consults were completed: Yes Patient Arrived: Ambulatory Added or deleted any medications: No Arrival Time: 09:13 Any new allergies or adverse reactions: No Accompanied By: spouse Had a fall or experienced change in No Transfer Assistance: None activities of daily living that may affect Patient Identification Verified: Yes risk of falls: Secondary Verification Process Completed: Yes Signs or symptoms of abuse/neglect since last visito No Patient Requires Transmission-Based Precautions: No Hospitalized since last visit: No Patient Has Alerts: No Implantable device outside of the clinic excluding No cellular tissue based products placed in the center since last visit: Pain Present Now: No Electronic Signature(s) Signed: 05/29/2021 2:38:52 PM By: Donavan Burnet CHT EMT BS , , Previous Signature: 05/29/2021 2:29:06 PM Version By: Donavan Burnet CHT EMT BS , , Entered By: Donavan Burnet on 05/29/2021 14:38:52 -------------------------------------------------------------------------------- Encounter Discharge Information Details Patient Name: Date of Service: [name] [name] 05/29/2021 11:00 A M Medical Record Number: [id number] Patient Account Number: [id number] Date of Birth/Sex: Treating RN: September 25, 1950 (70 y.o. [name] Primary Care Heinz Eckert: [name] Other Clinician: Donavan Burnet Referring Donley Harland: Treating  Monea Pesantez/Extender: [name] in Treatment: 2 Encounter Discharge Information Items Discharge Condition: Stable Ambulatory Status: Ambulatory Discharge Destination: Home Transportation: Private Auto Accompanied By: spouse Schedule Follow-up Appointment: No Clinical Summary of Care: Electronic Signature(s) Signed: 05/29/2021 2:38:12 PM By: Donavan Burnet CHT EMT BS , , Entered By: Donavan Burnet on 05/29/2021 14:38:11 -------------------------------------------------------------------------------- [place] Details Patient Name: Date of Service: [name] [name] 05/29/2021 11:00 A M Medical Record Number: [id number] Patient Account Number: [id number] Date of Birth/Sex: Treating RN: 1951-05-20 (70 y.o. [name] Primary Care Khira Cudmore: [name] Other Clinician: Donavan Burnet Referring Franciso Dierks: Treating Janda Cargo/Extender: [name] in Treatment: 2 Vital Signs Time Taken: 10:34 Temperature (F): 98.4 Height (in): 74 Pulse (bpm): 66 Weight (lbs): 315 Respiratory Rate (breaths/min): 16 Body Mass Index (BMI): 40.4 Blood Pressure (mmHg): 139/80 Reference Range: 80 - 120 mg / dl Electronic Signature(s) Signed: 05/29/2021 2:30:12 PM By: Donavan Burnet CHT EMT BS , , Entered By: Donavan Burnet on 05/29/2021 14:30:12

## 2021-05-29 NOTE — Progress Notes (Signed)
[name] [name] ([id number]) Visit Report for 05/29/2021 Chief Complaint Document Details Patient Name: Date of Service: [name] [name] 05/29/2021 9:30 A M Medical Record Number: [id number] Patient Account Number: [id number] Date of Birth/Sex: Treating RN: 27-Feb-1951 (70 y.o. [name] [name] Primary Care Provider: Agustina [name] Other Clinician: Referring Provider: Treating Provider/Extender: [name] [name] Weeks in Treatment: 2 Information Obtained from: Patient Chief Complaint Discuss hyperbaric oxygen treatment for radiation cystitis Electronic Signature(s) Signed: 05/29/2021 11:12:47 AM By: [name] Shan DO Entered By: [name] [name] on 05/29/2021 11:02:56 -------------------------------------------------------------------------------- HPI Details Patient Name: Date of Service: [name] [name] 05/29/2021 9:30 A M Medical Record Number: [id number] Patient Account Number: [id number] Date of Birth/Sex: Treating RN: 1950/07/21 (70 y.o. [name] [name] Primary Care Provider: Agustina [name] Other Clinician: Referring Provider: Treating Provider/Extender: [name] [name] Weeks in Treatment: 2 History of Present Illness HPI Description: Admission 05/15/2021 [name] [name] is a 69 year old male with a past medical history of prostate cancer status post radiation who was developed radiation cystitis. He states he has dysuria. He denies gross hematuria. He currently denies signs of infection. Patient's prostate cancer was diagnosed on 02/27/2017 by Dr. Link [name]. He had intensitymodulated radiation therapy from 09/09/2019 to 10/18/2019 for a total of 28 treatments. Following radiation thearpy he had a cystoscopy and that showed inflammation and edema at the bladder neck/prostate consistent with radiation cystitis. 05/29/2021; patient presents for HBO treatment today. He has no issues or complaints. He reports continued dysuria. He  denies hematuria. He has taken Ativan today to help with claustrophobia. Electronic Signature(s) Signed: 05/29/2021 11:12:47 AM By: [name] Shan DO Entered By: [name] [name] on 05/29/2021 11:03:37 -------------------------------------------------------------------------------- Physical Exam Details Patient Name: Date of Service: [name] [name] 05/29/2021 9:30 A M Medical Record Number: [id number] Patient Account Number: [id number] Date of Birth/Sex: Treating RN: 1950-07-15 (70 y.o. [name] [name] Primary Care Provider: Agustina [name] Other Clinician: Referring Provider: Treating Provider/Extender: [name] [name] Weeks in Treatment: 2 Constitutional respirations regular, non-labored and within target range for patient.. Ears, Nose, Mouth, and Throat no gross abnormality of ear auricles or external auditory canals. Respiratory normal breathing without difficulty. clear to auscultation bilaterally. Cardiovascular regular rate and rhythm with normal S1, S2. Psychiatric pleasant and cooperative. Notes Significant cerumen in ears bilaterally Electronic Signature(s) Signed: 05/29/2021 11:12:47 AM By: [name] Shan DO Entered By: [name] [name] on 05/29/2021 11:05:21 -------------------------------------------------------------------------------- Physician Orders Details Patient Name: Date of Service: [name] [name] 05/29/2021 9:30 A M Medical Record Number: [id number] Patient Account Number: [id number] Date of Birth/Sex: Treating RN: 10-Jun-1951 (70 y.o. [name] [name] Primary Care Provider: Agustina [name] Other Clinician: Referring Provider: Treating Provider/Extender: [name] [name] Weeks in Treatment: 2 Verbal / Phone Orders: No Diagnosis Coding ICD-10 Coding Code Description N30.40 Irradiation cystitis without hematuria C61 Malignant neoplasm of prostate R30.0 Dysuria Z00.00 Encounter for general adult  medical examination without abnormal findings Follow-up Appointments Return appointment in 1 month. - 30 day evaluation for hyberbaric protocol. Hyperbaric Oxygen Therapy Evaluate for HBO Therapy Indication: - Radiation Cystitis If appropriate for treatment, begin HBOT per protocol: 2.5 ATA for 90 Minutes with 2 Five (5) Minute A Breaks ir Total Number of Treatments: - 40 One treatments per day (delivered Monday through Friday unless otherwise specified in Special Instructions below): A frin (Oxymetazoline HCL) 0.05% nasal spray - 1 spray in both nostrils daily as needed prior to HBO treatment for difficulty clearing ears Electronic Signature(s) Signed: 05/29/2021 11:43:04  AM By: [name] Shan DO Signed: 05/29/2021 5:06:43 PM By: [name] Pilling RN, BSN Previous Signature: 05/29/2021 11:12:47 AM Version By: [name] Shan DO Entered By: [name] [name] on 05/29/2021 11:28:37 -------------------------------------------------------------------------------- Problem List Details Patient Name: Date of Service: [name] [name] 05/29/2021 9:30 A M Medical Record Number: [id number] Patient Account Number: [id number] Date of Birth/Sex: Treating RN: 11-Aug-1950 (70 y.o. [name] [name] Primary Care Provider: Agustina [name] Other Clinician: Referring Provider: Treating Provider/Extender: [name] [name] Weeks in Treatment: 2 Active Problems ICD-10 Encounter Code Description Active Date MDM Diagnosis N30.40 Irradiation cystitis without hematuria 05/15/2021 No Yes C61 Malignant neoplasm of prostate 05/15/2021 No Yes R30.0 Dysuria 05/15/2021 No Yes Z00.00 Encounter for general adult medical examination without abnormal findings 05/15/2021 No Yes Inactive Problems Resolved Problems Electronic Signature(s) Signed: 05/29/2021 11:12:47 AM By: [name] Shan DO Entered By: [name] [name] on 05/29/2021  11:02:20 -------------------------------------------------------------------------------- Progress Note Details Patient Name: Date of Service: [name] [name] 05/29/2021 9:30 A M Medical Record Number: [id number] Patient Account Number: [id number] Date of Birth/Sex: Treating RN: Mar 07, 1951 (70 y.o. [name] [name] Primary Care Provider: Agustina [name] Other Clinician: Referring Provider: Treating Provider/Extender: [name] [name] Weeks in Treatment: 2 Subjective Chief Complaint Information obtained from Patient Discuss hyperbaric oxygen treatment for radiation cystitis History of Present Illness (HPI) Admission 05/15/2021 [name] [name] is a 70 year old male with a past medical history of prostate cancer status post radiation who was developed radiation cystitis. He states he has dysuria. He denies gross hematuria. He currently denies signs of infection. Patient's prostate cancer was diagnosed on 02/27/2017 by Dr. Link [name]. He had intensityoomodulated radiation therapy from 09/09/2019 to 10/18/2019 for a total of 28 treatments. Following radiation thearpy he had a cystoscopy and that showed inflammation and edema at the bladder neck/prostate consistent with radiation cystitis. 05/29/2021; patient presents for HBO treatment today. He has no issues or complaints. He reports continued dysuria. He denies hematuria. He has taken Ativan today to help with claustrophobia. Patient History Information obtained from Patient. Family History Cancer - Mother,Father, Diabetes - Mother,Siblings, No family history of Heart Disease, Hereditary Spherocytosis, Hypertension, Kidney Disease, Lung Disease, Seizures, Stroke, Thyroid Problems, Tuberculosis. Social History Never smoker, Marital Status - Married, Alcohol Use - Never, Drug Use - No History, Caffeine Use - Rarely. Medical History Eyes Denies history of Cataracts, Glaucoma, Optic  Neuritis Ear/Nose/Mouth/Throat Denies history of Chronic sinus problems/congestion, Middle ear problems Hematologic/Lymphatic Denies history of Anemia, Hemophilia, Human Immunodeficiency Virus, Lymphedema, Sickle Cell Disease Respiratory Denies history of Aspiration, Asthma, Chronic Obstructive Pulmonary Disease (COPD), Pneumothorax, Sleep Apnea, Tuberculosis Cardiovascular Patient has history of Hypertension Denies history of Angina, Arrhythmia, Congestive Heart Failure, Coronary Artery Disease, Deep Vein Thrombosis, Hypotension, Myocardial Infarction, Peripheral Arterial Disease, Peripheral Venous Disease, Phlebitis, Vasculitis Gastrointestinal Denies history of Cirrhosis , Colitis, Crohnoos, Hepatitis A, Hepatitis B, Hepatitis C Endocrine Denies history of Type I Diabetes, Type II Diabetes Genitourinary Denies history of End Stage Renal Disease Immunological Denies history of Lupus Erythematosus, Raynaudoos, Scleroderma Integumentary (Skin) Denies history of History of Burn Musculoskeletal Denies history of Gout, Rheumatoid Arthritis, Osteoarthritis, Osteomyelitis Neurologic Denies history of Dementia, Neuropathy, Quadriplegia, Paraplegia, Seizure Disorder Oncologic Patient has history of Received Radiation - 28 treatments 2020 Prostate Ca Denies history of Received Chemotherapy Psychiatric Patient has history of Confinement Anxiety Denies history of Anorexia/bulimia Hospitalization/Surgery History - left ankle fusion 1991. - hernia repair umbilical 15 years ago. Medical A Surgical History Notes nd Cardiovascular hyperlipidemia Gastrointestinal Colon Polyps diverticulosis Genitourinary  hematuria radiation cystitis- currently Objective Constitutional respirations regular, non-labored and within target range for patient.. Ears, Nose, Mouth, and Throat no gross abnormality of ear auricles or external auditory canals. Respiratory normal breathing without difficulty.  clear to auscultation bilaterally. Cardiovascular regular rate and rhythm with normal S1, S2. Psychiatric pleasant and cooperative. General Notes: Significant cerumen in ears bilaterally Assessment Active Problems ICD-10 Irradiation cystitis without hematuria Malignant neoplasm of prostate Dysuria Encounter for general adult medical examination without abnormal findings Patient presents for his first HBO treatment For radiation cystitis. He has no issues or complaints today. He continues to have dysuria. He requires Ativan to help with claustrophobia. He will be evaluated every 30 days. Plan Follow-up Appointments: Return appointment in 1 month. - 30 day evaluation for hyberbaric protocol. Hyperbaric Oxygen Therapy: Evaluate for HBO Therapy Indication: - Radiation Cystitis If appropriate for treatment, begin HBOT per protocol: 2.5 ATA for 90 Minutes with 2 Five (5) Minute Air Breaks T Number of Treatments: - 40 otal One treatments per day (delivered Monday through Friday unless otherwise specified in Special Instructions below): Afrin (Oxymetazoline HCL) 0.05% nasal spray - 1 spray in both nostrils daily as needed prior to HBO treatment for difficulty clearing ears 1. HBO for 40 treatments 2. Follow-up in 4 weeks for reassessment Electronic Signature(s) Signed: 05/29/2021 11:43:04 AM By: [name] Shan DO Signed: 05/29/2021 5:06:43 PM By: [name] Pilling RN, BSN Previous Signature: 05/29/2021 11:12:47 AM Version By: [name] Shan DO Entered By: [name] [name] on 05/29/2021 11:32:08 -------------------------------------------------------------------------------- HxROS Details Patient Name: Date of Service: [name] [name] 05/29/2021 9:30 A M Medical Record Number: [id number] Patient Account Number: [id number] Date of Birth/Sex: Treating RN: November 16, 1950 (70 y.o. [name] [name] Primary Care Provider: Agustina [name] Other Clinician: Referring Provider: Treating  Provider/Extender: [name] [name] Weeks in Treatment: 2 Information Obtained From Patient Eyes Medical History: Negative for: Cataracts; Glaucoma; Optic Neuritis Ear/Nose/Mouth/Throat Medical History: Negative for: Chronic sinus problems/congestion; Middle ear problems Hematologic/Lymphatic Medical History: Negative for: Anemia; Hemophilia; Human Immunodeficiency Virus; Lymphedema; Sickle Cell Disease Respiratory Medical History: Negative for: Aspiration; Asthma; Chronic Obstructive Pulmonary Disease (COPD); Pneumothorax; Sleep Apnea; Tuberculosis Cardiovascular Medical History: Positive for: Hypertension Negative for: Angina; Arrhythmia; Congestive Heart Failure; Coronary Artery Disease; Deep Vein Thrombosis; Hypotension; Myocardial Infarction; Peripheral Arterial Disease; Peripheral Venous Disease; Phlebitis; Vasculitis Past Medical History Notes: hyperlipidemia Gastrointestinal Medical History: Negative for: Cirrhosis ; Colitis; Crohns; Hepatitis A; Hepatitis B; Hepatitis C Past Medical History Notes: Colon Polyps diverticulosis Endocrine Medical History: Negative for: Type I Diabetes; Type II Diabetes Genitourinary Medical History: Negative for: End Stage Renal Disease Past Medical History Notes: hematuria radiation cystitis- currently Immunological Medical History: Negative for: Lupus Erythematosus; Raynauds; Scleroderma Integumentary (Skin) Medical History: Negative for: History of Burn Musculoskeletal Medical History: Negative for: Gout; Rheumatoid Arthritis; Osteoarthritis; Osteomyelitis Neurologic Medical History: Negative for: Dementia; Neuropathy; Quadriplegia; Paraplegia; Seizure Disorder Oncologic Medical History: Positive for: Received Radiation - 28 treatments 2020 Prostate Ca Negative for: Received Chemotherapy Psychiatric Medical History: Positive for: Confinement Anxiety Negative for:  Anorexia/bulimia Immunizations Pneumococcal Vaccine: Received Pneumococcal Vaccination: Yes Received Pneumococcal Vaccination On or After 60th Birthday: Yes Implantable Devices No devices added Hospitalization / Surgery History Type of Hospitalization/Surgery left ankle fusion 1991 hernia repair umbilical 15 years ago Family and Social History Cancer: Yes - Mother,Father; Diabetes: Yes - Mother,Siblings; Heart Disease: No; Hereditary Spherocytosis: No; Hypertension: No; Kidney Disease: No; Lung Disease: No; Seizures: No; Stroke: No; Thyroid Problems: No; Tuberculosis: No; Never smoker; Marital Status - Married; Alcohol Use:  Never; Drug Use: No History; Caffeine Use: Rarely; Financial Concerns: No; Food, Clothing or Shelter Needs: No; Support System Lacking: No; Transportation Concerns: No Electronic Signature(s) Signed: 05/29/2021 11:12:47 AM By: [name] Shan DO Signed: 05/29/2021 5:06:43 PM By: [name] Pilling RN, BSN Entered By: [name] [name] on 05/29/2021 11:03:58 -------------------------------------------------------------------------------- SuperBill Details Patient Name: Date of Service: [name] [name] 05/29/2021 Medical Record Number: [id number] Patient Account Number: [id number] Date of Birth/Sex: Treating RN: 12-26-1950 (70 y.o. [name] [name] Primary Care Provider: Agustina [name] Other Clinician: Referring Provider: Treating Provider/Extender: [name] [name] Weeks in Treatment: 2 Diagnosis Coding ICD-10 Codes Code Description N30.40 Irradiation cystitis without hematuria C61 Malignant neoplasm of prostate R30.0 Dysuria Z00.00 Encounter for general adult medical examination without abnormal findings Facility Procedures CPT4 Code: [id number] Description: [phone] - WOUND CARE VISIT-LEV 2 EST PT Modifier: Quantity: 1 Physician Procedures : CPT4 Code Description Modifier [id number] - WC PHYS LEVEL 3 - EST PT ICD-10 Diagnosis  Description N30.40 Irradiation cystitis without hematuria C61 Malignant neoplasm of prostate R30.0 Dysuria Quantity: 1 Electronic Signature(s) Signed: 05/29/2021 11:43:04 AM By: [name] Shan DO Signed: 05/29/2021 5:06:43 PM By: [name] Pilling RN, BSN Previous Signature: 05/29/2021 11:12:47 AM Version By: [name] Shan DO Entered By: [name] [name] on 05/29/2021 11:32:53

## 2021-05-29 NOTE — Progress Notes (Signed)
[name] [name] ([id number]) Visit Report for 05/29/2021 Arrival Information Details Patient Name: Date of Service: [name] [name] 05/29/2021 9:30 A M Medical Record Number: [id number] Patient Account Number: [id number] Date of Birth/Sex: Treating RN: [birth year] (70 y.o. [name] [name] Primary Care Jocilyn Trego: [name] Other Clinician: Referring Michala Deblanc: Treating Amol Domanski/Extender: [name]: 2 Visit Information History Since Last Visit Added or deleted any medications: No Patient Arrived: Ambulatory Any new allergies or adverse reactions: No Arrival Time: 10:30 Had a fall or experienced change in No Accompanied By: self activities of daily living that may affect Transfer Assistance: None risk of falls: Patient Identification Verified: Yes Signs or symptoms of abuse/neglect since last visito No Secondary Verification Process Completed: Yes Hospitalized since last visit: No Patient Requires Transmission-Based Precautions: No Implantable device outside of the clinic excluding No Patient Has Alerts: No cellular tissue based products placed in the center since last visit: Pain Present Now: No Notes Starting HBO today. Electronic Signature(s) Signed: 05/29/2021 5:06:43 PM By: Deon Pilling RN, BSN Entered By: Deon Pilling on 05/29/2021 10:45:04 -------------------------------------------------------------------------------- Clinic Level of Care Assessment Details Patient Name: Date of Service: [name] [name] 05/29/2021 9:30 A M Medical Record Number: [id number] Patient Account Number: [id number] Date of Birth/Sex: Treating RN: 24-Aug-1950 (70 y.o. [name] [name] Primary Care Dalin Caldera: [name] Other Clinician: Referring Keefe Zawistowski: Treating Pailyn Bellevue/Extender: [name]: 2 Clinic Level of Care Assessment Items TOOL 4 Quantity Score X- 1 0 Use when only an EandM is  performed on FOLLOW-UP visit ASSESSMENTS - Nursing Assessment / Reassessment X- 1 10 Reassessment of Co-morbidities (includes updates in patient status) X- 1 5 Reassessment of Adherence to Treatment Plan ASSESSMENTS - Wound and Skin A ssessment / Reassessment []  - 0 Simple Wound Assessment / Reassessment - one wound []  - 0 Complex Wound Assessment / Reassessment - multiple wounds []  - 0 Dermatologic / Skin Assessment (not related to wound area) ASSESSMENTS - Focused Assessment []  - 0 Circumferential Edema Measurements - multi extremities []  - 0 Nutritional Assessment / Counseling / Intervention []  - 0 Lower Extremity Assessment (monofilament, tuning fork, pulses) []  - 0 Peripheral Arterial Disease Assessment (using hand held doppler) ASSESSMENTS - Ostomy and/or Continence Assessment and Care []  - 0 Incontinence Assessment and Management []  - 0 Ostomy Care Assessment and Management (repouching, etc.) PROCESS - Coordination of Care X - Simple Patient / Family Education for ongoing care 1 15 []  - 0 Complex (extensive) Patient / Family Education for ongoing care X- 1 10 Staff obtains Programmer, systems, Records, T Results / Process Orders est []  - 0 Staff telephones HHA, Nursing Homes / Clarify orders / etc []  - 0 Routine Transfer to another Facility (non-emergent condition) []  - 0 Routine Hospital Admission (non-emergent condition) []  - 0 New Admissions / Biomedical engineer / Ordering NPWT Apligraf, etc. , []  - 0 Emergency Hospital Admission (emergent condition) X- 1 10 Simple Discharge Coordination []  - 0 Complex (extensive) Discharge Coordination PROCESS - Special Needs []  - 0 Pediatric / Minor Patient Management []  - 0 Isolation Patient Management []  - 0 Hearing / Language / Visual special needs []  - 0 Assessment of Community assistance (transportation, D/C planning, etc.) []  - 0 Additional assistance / Altered mentation []  - 0 Support Surface(s) Assessment  (bed, cushion, seat, etc.) INTERVENTIONS - Wound Cleansing / Measurement []  - 0 Simple Wound Cleansing - one wound []  - 0 Complex Wound Cleansing - multiple wounds []  -  0 Wound Imaging (photographs - any number of wounds) []  - 0 Wound Tracing (instead of photographs) []  - 0 Simple Wound Measurement - one wound []  - 0 Complex Wound Measurement - multiple wounds INTERVENTIONS - Wound Dressings []  - 0 Small Wound Dressing one or multiple wounds []  - 0 Medium Wound Dressing one or multiple wounds []  - 0 Large Wound Dressing one or multiple wounds []  - 0 Application of Medications - topical []  - 0 Application of Medications - injection INTERVENTIONS - Miscellaneous []  - 0 External ear exam []  - 0 Specimen Collection (cultures, biopsies, blood, body fluids, etc.) []  - 0 Specimen(s) / Culture(s) sent or taken to Lab for analysis []  - 0 Patient Transfer (multiple staff / Civil Service fast streamer / Similar devices) []  - 0 Simple Staple / Suture removal (25 or less) []  - 0 Complex Staple / Suture removal (26 or more) []  - 0 Hypo / Hyperglycemic Management (close monitor of Blood Glucose) []  - 0 Ankle / Brachial Index (ABI) - do not check if billed separately X- 1 5 Vital Signs Has the patient been seen at the hospital within the last three years: Yes Total Score: 55 Level Of Care: New/Established - Level 2 Electronic Signature(s) Signed: 05/29/2021 5:06:43 PM By: Deon Pilling RN, BSN Entered By: Deon Pilling on 05/29/2021 11:29:56 -------------------------------------------------------------------------------- Encounter Discharge Information Details Patient Name: Date of Service: [name] [name] D 05/29/2021 9:30 A M Medical Record Number: [id number] Patient Account Number: [id number] Date of Birth/Sex: Treating RN: 04-01-1951 (70 y.o. [name] [name] Primary Care Sinai Mahany: [name] Other Clinician: Referring Diamonique Ruedas: Treating Ramandeep Arington/Extender: [name] in Treatment: 2 Encounter Discharge Information Items Discharge Condition: Stable Ambulatory Status: Ambulatory Discharge Destination: Home Transportation: Private Auto Accompanied By: self Schedule Follow-up Appointment: No Clinical Summary of Care: Notes hyberbaric treatment now. Electronic Signature(s) Signed: 05/29/2021 5:06:43 PM By: Deon Pilling RN, BSN Entered By: Deon Pilling on 05/29/2021 11:37:31 -------------------------------------------------------------------------------- Lower Extremity Assessment Details Patient Name: Date of Service: [name] [name] 05/29/2021 9:30 A M Medical Record Number: [id number] Patient Account Number: [id number] Date of Birth/Sex: Treating RN: 04/28/1951 (70 y.o. [name] [name] Primary Care Ameerah Huffstetler: [name] Other Clinician: Referring Marquelle Musgrave: Treating Franky Reier/Extender: [name]: 2 Electronic Signature(s) Signed: 05/29/2021 5:06:43 PM By: Deon Pilling RN, BSN Entered By: Deon Pilling on 05/29/2021 10:45:43 -------------------------------------------------------------------------------- Multi Wound Chart Details Patient Name: Date of Service: [name] [name] D 05/29/2021 9:30 A M Medical Record Number: [id number] Patient Account Number: [id number] Date of Birth/Sex: Treating RN: 07-04-1950 (70 y.o. [name] [name] Primary Care Herrick Hartog: [name] Other Clinician: Referring Avrian Delfavero: Treating Yajayra Feldt/Extender: [name]: 2 Wound Assessments Treatment Notes Electronic Signature(s) Signed: 05/29/2021 11:12:47 AM By: [name] DO Signed: 05/29/2021 5:06:43 PM By: Deon Pilling RN, BSN Entered By: [name] on 05/29/2021 11:02:36 -------------------------------------------------------------------------------- Multi-Disciplinary Care Plan Details Patient Name: Date of  Service: [name] [name] D 05/29/2021 9:30 A M Medical Record Number: [id number] Patient Account Number: [id number] Date of Birth/Sex: Treating RN: Oct 23, 1950 (70 y.o. [name] [name] Primary Care Deaisha Welborn: [name] Other Clinician: Referring Talyn Dessert: Treating Daylynn Stumpp/Extender: [name]: 2 Active Inactive HBO Nursing Diagnoses: Anxiety related to feelings of confinement associated with the hyperbaric oxygen chamber Anxiety related to knowledge deficit of hyperbaric oxygen therapy and treatment procedures Goals: Patient and/or family will be able to state/discuss factors appropriate to the management of their disease  process during treatment Date Initiated: 05/15/2021 Target Resolution Date: 06/15/2021 Goal Status: Active Patient/caregiver will verbalize understanding of HBO goals, rationale, procedures and potential hazards Date Initiated: 05/15/2021 Target Resolution Date: 06/15/2021 Goal Status: Active Interventions: Administer decongestants, per physician orders, prior to HBO2 Assess and provide for patients comfort related to the hyperbaric environment and equalization of middle ear Assess for signs and symptoms related to adverse events, including but not limited to confinement anxiety, pneumothorax, oxygen toxicity and baurotrauma Notes: Electronic Signature(s) Signed: 05/29/2021 5:06:43 PM By: Deon Pilling RN, BSN Entered By: Deon Pilling on 05/29/2021 10:46:47 -------------------------------------------------------------------------------- Pain Assessment Details Patient Name: Date of Service: [name] [name] D 05/29/2021 9:30 A M Medical Record Number: [id number] Patient Account Number: [id number] Date of Birth/Sex: Treating RN: 08/29/1950 (70 y.o. [name] [name] Primary Care Keera Altidor: [name] Other Clinician: Referring Nikoleta Dady: Treating Shereda Graw/Extender: [name] Weeks in  Treatment: 2 Active Problems Location of Pain Severity and Description of Pain Patient Has Paino No Site Locations Rate the pain. Current Pain Level: 0 Pain Management and Medication Current Pain Management: Medication: No Cold Application: No Rest: No Massage: No Activity: No T.E.N.S.: No Heat Application: No Leg drop or elevation: No Is the Current Pain Management Adequate: Adequate How does your wound impact your activities of daily livingo Sleep: No Bathing: No Appetite: No Relationship With Others: No Bladder Continence: No Emotions: No Bowel Continence: No Work: No Toileting: No Drive: No Dressing: No Hobbies: No Engineer, maintenance) Signed: 05/29/2021 5:06:43 PM By: Deon Pilling RN, BSN Entered By: Deon Pilling on 05/29/2021 10:45:31 -------------------------------------------------------------------------------- Patient/Caregiver Education Details Patient Name: Date of Service: [name] [name] D 12/20/2022andnbsp9:30 A M Medical Record Number: [id number] Patient Account Number: [id number] Date of Birth/Gender: Treating RN: [birth year] (70 y.o. [name] [name] Primary Care Physician: [name] Other Clinician: Referring Physician: Treating Physician/Extender: [name] in Treatment: 2 Education Assessment Education Provided To: Patient Education Topics Provided Hyperbaric Oxygenation: Handouts: Hyperbaric Oxygen Methods: Explain/Verbal Responses: Reinforcements needed Electronic Signature(s) Signed: 05/29/2021 5:06:43 PM By: Deon Pilling RN, BSN Entered By: Deon Pilling on 05/29/2021 10:47:31 -------------------------------------------------------------------------------- Vitals Details Patient Name: Date of Service: [name] [name] D 05/29/2021 9:30 A M Medical Record Number: [id number] Patient Account Number: [id number] Date of Birth/Sex: Treating RN: [birth year] (70 y.o. [name] [name] Primary  Care Davonna Ertl: [name] Other Clinician: Referring Caycee Wanat: Treating Chamar Broughton/Extender: [name]: 2 Vital Signs Time Taken: 10:34 Temperature (F): 98.2 Height (in): 74 Pulse (bpm): 62 Weight (lbs): 315 Respiratory Rate (breaths/min): 16 Body Mass Index (BMI): 40.4 Blood Pressure (mmHg): 142/88 Reference Range: 80 - 120 mg / dl Electronic Signature(s) Signed: 05/29/2021 3:57:25 PM By: Donavan Burnet CHT EMT BS , , Entered By: Donavan Burnet on 05/29/2021 15:57:25

## 2021-05-29 NOTE — Progress Notes (Addendum)
[name] [name] ([id number]) Visit Report for 05/29/2021 HBO Details Patient Name: Date of Service: [name] [name] 05/29/2021 11:00 A M Medical Record Number: [id number] Patient Account Number: [id number] Date of Birth/Sex: Treating RN: 1951-02-04 (70 y.o. [name] [name] Primary Care [name] [name]: [name] [name] Other Clinician: Donavan [name] Referring [name] [name]: Treating [name] [name]/Extender: [name] [name] in Treatment: 2 HBO Treatment Course Details Treatment Course Number: 1 Ordering [name] [name]: [name] [name] T Treatments Ordered: otal 40 HBO Treatment Start Date: 05/29/2021 HBO Indication: Late Effect of Radiation HBO Treatment Details Treatment Number: 1 Patient Type: Outpatient Chamber Type: Monoplace Chamber Serial #: [id number] Treatment Protocol: 2.5 ATA with 90 minutes oxygen, with two 5 minute air breaks Treatment Details Compression Rate Down: 1.0 psi / minute De-Compression Rate Up: 1.0 psi / minute A breaks and breathing ir Compress Tx Pressure periods Decompress Decompress Begins Reached (leave unused spaces Begins Ends blank) Chamber Pressure (ATA 1 2.5 2.5 2.5 2.5 2.5 - - 2.5 1 ) Clock Time (24 hr) 10:48 11:33 12:03 12:08 12:38 12:43 - - 13:13 13:28 Treatment Length: 160 (minutes) Treatment Segments: 5 Vital Signs Capillary Blood Glucose Reference Range: 80 - 120 mg / dl HBO Diabetic Blood Glucose Intervention Range: <131 mg/dl or >249 mg/dl Time Vitals Blood Respiratory Capillary Blood Glucose Pulse Action Type: Pulse: Temperature: Taken: Pressure: Rate: Glucose (mg/dl): Meter #: Oximetry (%) Taken: Pre 10:34 139/80 66 16 98.4 Post 13:36 142/88 62 16 98.2 Treatment Response Treatment Toleration: Well Treatment Completion Status: Treatment Completed without Adverse Event Tagg Eustice Notes No concerns with treatment given Physician HBO Attestation: I certify that I supervised this HBO treatment in accordance with  Medicare guidelines. A trained emergency response team is readily available per Yes hospital policies and procedures. Continue HBOT as ordered. Yes Electronic Signature(s) Signed: 05/29/2021 4:29:35 PM By: [name] Ham MD Previous Signature: 05/29/2021 3:59:22 PM Version By: [name] [name] CHT EMT BS , , Previous Signature: 05/29/2021 2:35:33 PM Version By: [name] [name] CHT EMT BS , , Entered By: [name] [name] on 05/29/2021 16:25:11 -------------------------------------------------------------------------------- HBO Safety Checklist Details Patient Name: Date of Service: [name] [name] 05/29/2021 11:00 A M Medical Record Number: [id number] Patient Account Number: [id number] Date of Birth/Sex: Treating RN: [birth year] (70 y.o. [name] [name] Primary Care [name] [name]: [name] [name] Other Clinician: Donavan [name] Referring [name] [name]: Treating [name] [name]/Extender: [name] [name] in Treatment: 2 HBO Safety Checklist Items Safety Checklist Consent Form Signed Patient voided / foley secured and emptied When did you last eato 0800 Last dose of injectable or oral agent n/a Ostomy pouch emptied and vented if applicable NA All implantable devices assessed, documented and approved NA Intravenous access site secured and place NA Valuables secured Linens and cotton and cotton/polyester blend (less than 51% polyester) Personal oil-based products / skin lotions / body lotions removed Wigs or hairpieces removed NA Smoking or tobacco materials removed NA Books / newspapers / magazines / loose paper removed Cologne, aftershave, perfume and deodorant removed Jewelry removed (may wrap wedding band) Make-up removed NA Hair care products removed Battery operated devices (external) removed Heating patches and chemical warmers removed Titanium eyewear removed NA Nail polish cured greater than 10 hours NA Casting material cured greater than 10  hours NA Hearing aids removed NA Loose dentures or partials removed NA Prosthetics have been removed NA Patient demonstrates correct use of air break device (if applicable) Patient concerns have been addressed Patient grounding bracelet on and cord attached to chamber Specifics for Inpatients (complete in addition  to above) Medication sheet sent with patient NA Intravenous medications needed or due during therapy sent with patient NA Drainage tubes (e.g. nasogastric tube or chest tube secured and vented) NA Endotracheal or Tracheotomy tube secured NA Cuff deflated of air and inflated with saline NA Airway suctioned NA Notes Paper version used prior to treatment. Electronic Signature(s) Signed: 05/30/2021 1:52:07 PM By: [name] [name] CHT EMT BS , , Previous Signature: 05/29/2021 2:31:38 PM Version By: [name] [name] CHT EMT BS , , Entered By: [name] [name] on 05/30/2021 13:52:07

## 2021-05-29 NOTE — Progress Notes (Signed)
SHYLOH, KRINKE ([id number]) Visit Report for 05/29/2021 SuperBill Details Patient Name: Date of Service: [name] [name] 05/29/2021 Medical Record Number: [id number] Patient Account Number: [id number] Date of Birth/Sex: Treating RN: 1950/08/21 (70 y.o. 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

## 2021-05-30 ENCOUNTER — Encounter (HOSPITAL_BASED_OUTPATIENT_CLINIC_OR_DEPARTMENT_OTHER): Payer: Medicare Other | Admitting: Physician Assistant

## 2021-05-30 DIAGNOSIS — N3040 Irradiation cystitis without hematuria: Secondary | ICD-10-CM | POA: Diagnosis not present

## 2021-05-30 DIAGNOSIS — H6123 Impacted cerumen, bilateral: Secondary | ICD-10-CM | POA: Diagnosis not present

## 2021-05-30 DIAGNOSIS — L929 Granulomatous disorder of the skin and subcutaneous tissue, unspecified: Secondary | ICD-10-CM | POA: Diagnosis not present

## 2021-05-30 DIAGNOSIS — H6983 Other specified disorders of Eustachian tube, bilateral: Secondary | ICD-10-CM | POA: Diagnosis not present

## 2021-05-30 DIAGNOSIS — Z8709 Personal history of other diseases of the respiratory system: Secondary | ICD-10-CM | POA: Diagnosis not present

## 2021-05-30 DIAGNOSIS — L598 Other specified disorders of the skin and subcutaneous tissue related to radiation: Secondary | ICD-10-CM | POA: Diagnosis not present

## 2021-05-30 DIAGNOSIS — J3489 Other specified disorders of nose and nasal sinuses: Secondary | ICD-10-CM | POA: Diagnosis not present

## 2021-05-30 NOTE — Progress Notes (Addendum)
MAJD, TISSUE ([id number]) Visit Report for 05/30/2021 HBO Details Patient Name: Date of Service: [name] [name] 05/30/2021 10:00 A M Medical Record Number: [id number] Patient Account Number: [id number] Date of Birth/Sex: Treating RN: [birth year] (70 y.o. [name] Primary Care Emmamae Mcnamara: [name] Other Clinician: Donavan Burnet Referring Avika Carbine: Treating Corita Allinson/Extender: Meridee Score in Treatment: 2 HBO Treatment Course Details Treatment Course Number: 1 Ordering Urho Rio: [name] T Treatments Ordered: otal 40 HBO Treatment Start Date: 05/29/2021 HBO Indication: Late Effect of Radiation HBO Treatment Details Treatment Number: Treatment Aborted/Not Restarted: Adverse Event Patient Type: Outpatient Chamber Type: Monoplace Chamber Serial #: [id number] Treatment Protocol: 2.5 ATA with 90 minutes oxygen, with two 5 minute air breaks Treatment Details Compression Rate Down: 1.0 psi / minute De-Compression Rate Up: 1.0 psi / minute A breaks and ir breathing Compress Tx Pressure Decompress Decompress periods Begins Reached Begins Ends (leave unused spaces blank) Chamber Pressure (ATA 1 2.5 2.5 2.5 2.5 2.5 - - 2.5 1 ) Clock Time (24 hr) 10:25 - - - - - --- 10:29 Treatment Length: 4 (minutes) Treatment Segments: 0 Vital Signs Capillary Blood Glucose Reference Range: 80 - 120 mg / dl HBO Diabetic Blood Glucose Intervention Range: <131 mg/dl or >249 mg/dl Time Vitals Blood Respiratory Capillary Blood Glucose Pulse Action Type: Pulse: Temperature: Taken: Pressure: Rate: Glucose (mg/dl): Meter #: Oximetry (%) Taken: Pre 09:53 130/74 74 18 98 Treatment Response Treatment Toleration: Poor Adverse Events: 1:Barotrauma - Ear Treatment Completion Status: Treatment Aborted/Not Restarted Reason: Adverse Event Treatment Notes Patient reported sensitivity and "stuffiness" in left ear this morning. Faheem Ziemann notified.  Treatment attempted. Patient was unable to equalize pressure in middle ear past 2 psi immediately reporting sensation of pressure change and some pain and was unable to continue treatment. Leontae Bostock Notes I actually went in to check the patient today because of the fact he was told me that he was having issues with his ears over the past day following yesterday's treatment. Again I did look in his ear is also with cerumen impaction I could not see past that see whether there was a teed injury or not. Subsequently we discussed trying to proceed with therapy today he wanted to try that but unfortunately he was unable to really pressurize at all. We discontinued the treatment today and subsequently we did send him to ear nose and throat to have this further evaluated. We will see what they have to say at minimum his ears need to be cleaned out at maximum I am not even certain he is not on any tubes to continue with treatment currently. Physician HBO Attestation: I certify that I supervised this HBO treatment in accordance with Medicare guidelines. A trained emergency response team is readily available per No hospital policies and procedures. Continue HBOT as ordered. No Electronic Signature(s) Signed: 05/30/2021 5:17:40 PM By: Worthy Keeler PA-C Previous Signature: 05/30/2021 11:56:01 AM Version By: Donavan Burnet CHT EMT BS , , Entered By: Worthy Keeler on 05/30/2021 17:17:40 -------------------------------------------------------------------------------- HBO Safety Checklist Details Patient Name: Date of Service: [name] [name] D 05/30/2021 10:00 A M Medical Record Number: [id number] Patient Account Number: [id number] Date of Birth/Sex: Treating RN: 1950-09-09 (70 y.o. [name] Primary Care Jatavious Peppard: [name] Other Clinician: Donavan Burnet Referring Remus Hagedorn: Treating Rozina Pointer/Extender: [name] Weeks in Treatment: 2 HBO Safety  Checklist Items Safety Checklist Consent Form Signed Patient voided / foley secured and emptied When did you last eato  0830 Last dose of injectable or oral agent n/a Ostomy pouch emptied and vented if applicable NA All implantable devices assessed, documented and approved NA Intravenous access site secured and place NA Valuables secured Linens and cotton and cotton/polyester blend (less than 51% polyester) Personal oil-based products / skin lotions / body lotions removed Wigs or hairpieces removed NA Smoking or tobacco materials removed NA Books / newspapers / magazines / loose paper removed Cologne, aftershave, perfume and deodorant removed Jewelry removed (may wrap wedding band) Make-up removed NA Hair care products removed Battery operated devices (external) removed Heating patches and chemical warmers removed Titanium eyewear removed NA Nail polish cured greater than 10 hours NA Casting material cured greater than 10 hours NA Hearing aids removed NA Loose dentures or partials removed NA Prosthetics have been removed NA Patient demonstrates correct use of air break device (if applicable) Patient concerns have been addressed Patient grounding bracelet on and cord attached to chamber Specifics for Inpatients (complete in addition to above) Medication sheet sent with patient NA Intravenous medications needed or due during therapy sent with patient NA Drainage tubes (e.g. nasogastric tube or chest tube secured and vented) NA Endotracheal or Tracheotomy tube secured NA Cuff deflated of air and inflated with saline NA Airway suctioned NA Notes Paper version used prior to treatment. Electronic Signature(s) Signed: 05/30/2021 11:50:20 AM By: Donavan Burnet CHT EMT BS , , Entered By: Donavan Burnet on 05/30/2021 11:50:20

## 2021-05-30 NOTE — Progress Notes (Signed)
RIGDON, MACOMBER ([id number]) Visit Report for 05/30/2021 Problem List Details Patient Name: Date of Service: [name] [name] 05/30/2021 10:00 [place] Record Number: [id number] Patient Account Number: [id number] Date of Birth/Sex: Treating RN: Jul 09, 1950 (70 y.o. 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 05/15/2021 No Yes C61 Malignant neoplasm of prostate 05/15/2021 No Yes R30.0 Dysuria 05/15/2021 No Yes Z00.00 Encounter for general adult medical examination without abnormal findings 05/15/2021 No Yes Inactive Problems Resolved Problems Electronic Signature(s) Signed: 05/30/2021 5:18:08 PM By: Worthy Keeler PA-C Entered By: Worthy Keeler on 05/30/2021 17:18:08 -------------------------------------------------------------------------------- SuperBill Details Patient Name: Date of Service: [name] [name] 05/30/2021 Medical Record Number: [id number] Patient Account Number: [id number] Date of Birth/Sex: Treating RN: 1951-02-27 (70 y.o. 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 C61 Malignant neoplasm of prostate R30.0 Dysuria Z00.00 Encounter for general adult medical examination without abnormal findings Facility Procedures CPT4 Code: [id number] Description: [phone] - WOUND CARE VISIT-LEV 2 EST PT Modifier: Quantity: 1 Physician Procedures : CPT4 Code Description Modifier [id number] - WC PHYS LEVEL 3 - EST PT ICD-10 Diagnosis Description N30.40  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

## 2021-05-30 NOTE — Progress Notes (Addendum)
[name] [name], [name] [name] ([id number]) Visit Report for 05/30/2021 Arrival Information Details Patient Name: Date of Service: [name] [name] 05/30/2021 10:00 [name] [name] Record Number: [id number] Patient Account Number: [id number] Date of Birth/Sex: Treating RN: 1951-02-16 (70 y.o. [name] [name] Primary Care [name] [name]: [name] [name] Other Clinician: Donavan [name] Referring [name] [name]: Treating [name] [name]/Extender: [name] [name] in Treatment: 2 Visit Information History Since Last Visit All ordered tests and consults were completed: Yes Patient Arrived: Ambulatory Added or deleted any medications: No Arrival Time: 09:47 Any new allergies or adverse reactions: No Accompanied By: spouse Had a fall or experienced change in No Transfer Assistance: None activities of daily living that may affect Patient Identification Verified: Yes risk of falls: Secondary Verification Process Completed: Yes Signs or symptoms of abuse/neglect since last visito No Patient Requires Transmission-Based Precautions: No Hospitalized since last visit: No Patient Has Alerts: No Implantable device outside of the clinic excluding No cellular tissue based products placed in the center since last visit: Pain Present Now: No Electronic Signature(s) Signed: 05/30/2021 11:46:54 AM By: [name] [name] CHT EMT BS , , Entered By: [name] [name] on 05/30/2021 11:46:54 -------------------------------------------------------------------------------- Encounter Discharge Information Details Patient Name: Date of Service: [name] [name] 05/30/2021 10:00 [name] [name] Record Number: [id number] Patient Account Number: [id number] Date of Birth/Sex: Treating RN: Aug 03, 1950 (70 y.o. [name] [name] Primary Care [name] [name]: [name] [name] Other Clinician: Donavan [name] Referring [name] [name]: Treating [name] [name]/Extender: [name] [name] in Treatment: 2 Encounter  Discharge Information Items Discharge Condition: Stable Ambulatory Status: Ambulatory Discharge Destination: Home Transportation: Private Auto Accompanied ByRaechel [name] Schedule Follow-up Appointment: No Clinical Summary of Care: Electronic Signature(s) Signed: 05/30/2021 11:57:13 AM By: [name] [name] CHT EMT BS , , Entered By: [name] [name] on 05/30/2021 11:57:13 -------------------------------------------------------------------------------- Vitals Details Patient Name: Date of Service: [name] [name] 05/30/2021 10:00 [name] [name] Record Number: [id number] Patient Account Number: [id number] Date of Birth/Sex: Treating RN: Jun 28, 1950 (70 y.o. [name] [name] Primary Care [name] [name]: [name] [name] Other Clinician: Valeria [name] Referring [name] [name]: Treating [name] [name]/Extender: [name] [name] in Treatment: 2 Vital Signs Time Taken: 09:53 Temperature (F): 98.0 Height (in): 74 Pulse (bpm): 74 Weight (lbs): 315 Respiratory Rate (breaths/min): 18 Body Mass Index (BMI): 40.4 Blood Pressure (mmHg): 130/74 Reference Range: 80 - 120 mg / dl Electronic Signature(s) Signed: 05/30/2021 11:57:48 AM By: [name] [name] CHT EMT BS , , Previous Signature: 05/30/2021 11:48:26 AM Version By: [name] [name] CHT EMT BS , , Entered By: [name] [name] on 05/30/2021 11:57:48

## 2021-05-31 ENCOUNTER — Encounter (HOSPITAL_BASED_OUTPATIENT_CLINIC_OR_DEPARTMENT_OTHER): Payer: Medicare Other | Admitting: Internal Medicine

## 2021-06-01 ENCOUNTER — Encounter (HOSPITAL_BASED_OUTPATIENT_CLINIC_OR_DEPARTMENT_OTHER): Payer: Medicare Other | Admitting: Internal Medicine

## 2021-06-05 ENCOUNTER — Encounter (HOSPITAL_BASED_OUTPATIENT_CLINIC_OR_DEPARTMENT_OTHER): Payer: Medicare Other | Admitting: Internal Medicine

## 2021-06-06 ENCOUNTER — Other Ambulatory Visit: Payer: Self-pay

## 2021-06-06 ENCOUNTER — Encounter (HOSPITAL_BASED_OUTPATIENT_CLINIC_OR_DEPARTMENT_OTHER): Payer: Medicare Other | Admitting: Internal Medicine

## 2021-06-06 DIAGNOSIS — L598 Other specified disorders of the skin and subcutaneous tissue related to radiation: Secondary | ICD-10-CM | POA: Diagnosis not present

## 2021-06-06 DIAGNOSIS — N3040 Irradiation cystitis without hematuria: Secondary | ICD-10-CM | POA: Diagnosis not present

## 2021-06-06 NOTE — Progress Notes (Addendum)
[name], [name] ([id number]) Visit Report for 06/06/2021 Physician Orders Details Patient Name: Date of Service: [name] [name] 06/06/2021 10:00 [place] Record Number: [id number] Patient Account Number: [id number] Date of Birth/Sex: Treating RN: 02-15-1951 (70 y.o. 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 Afrin (Oxymetazoline HCL) 0.05% nasal spray - 1 spray in both nostrils daily as needed prior to HBO treatment for difficulty clearing ears Electronic Signature(s) Signed: 06/06/2021 8:14:03 PM By: Deon Pilling RN, BSN Signed: 06/07/2021 5:16:25 PM By: Linton Ham MD Entered By: Deon Pilling on 06/06/2021 20:13:12 -------------------------------------------------------------------------------- Problem List Details Patient Name: Date of Service: [name] 06/06/2021 10:00 [place] Record Number: [id number] Patient Account Number: [id number] Date of Birth/Sex: Treating RN: [birth year] (70 y.o. 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 05/15/2021 No Yes C61 Malignant neoplasm of prostate 05/15/2021 No Yes R30.0  Dysuria 05/15/2021 No Yes Z00.00 Encounter for general adult medical examination without abnormal findings 05/15/2021 No Yes Inactive Problems Resolved Problems Electronic Signature(s) Signed: 06/06/2021 8:14:03 PM By: Deon Pilling RN, BSN Signed: 06/07/2021 5:16:25 PM By: Linton Ham MD Entered By: Deon Pilling on 06/06/2021 20:05:20 -------------------------------------------------------------------------------- SuperBill Details Patient Name: Date of Service: [name] [name] 06/06/2021 Medical Record Number: [id number] Patient Account Number: [id number] Date of Birth/Sex: Treating RN: 11-01-1950 (70 y.o. 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 C61 Malignant neoplasm of prostate R30.0 Dysuria Z00.00 Encounter for general adult medical examination without abnormal findings Facility Procedures CPT4 Code: [id number] Description: G0277-(Facility Use Only) HBOT full body chamber, 23min , Modifier: Quantity: 4 Physician Procedures : CPT4 Code Description Modifier [id number] - WC PHYS HYPERBARIC OXYGEN THERAPY ICD-10 Diagnosis Description 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

## 2021-06-06 NOTE — Progress Notes (Signed)
[name] [name], [name] [name] ([id number]) Visit Report for 06/06/2021 Arrival Information Details Patient Name: Date of Service: [name] [name] 06/06/2021 10:00 [place] Record Number: [id number] Patient Account Number: [id number] Date of Birth/Sex: Treating RN: [birth year] (70 y.o. [name] [name] Primary Care Yeraldy Spike: [name] Other Clinician: Referring Wealthy Danielski: Treating Bertis Hustead/Extender: [name] in Treatment: 3 Visit Information History Since Last Visit Added or deleted any medications: No Patient Arrived: Ambulatory Any new allergies or adverse reactions: No Arrival Time: 10:08 Had a fall or experienced change in No Accompanied By: self activities of daily living that may affect Transfer Assistance: None risk of falls: Patient Identification Verified: Yes Signs or symptoms of abuse/neglect since last visito No Secondary Verification Process Completed: Yes Hospitalized since last visit: No Patient Requires Transmission-Based Precautions: No Implantable device outside of the clinic excluding No Patient Has Alerts: No cellular tissue based products placed in the center since last visit: Pain Present Now: No Electronic Signature(s) Signed: 06/06/2021 3:01:11 PM By: Deon Pilling RN, BSN Entered By: Deon Pilling on 06/06/2021 13:32:37 -------------------------------------------------------------------------------- Encounter Discharge Information Details Patient Name: Date of Service: [name] [name] 06/06/2021 10:00 [place] Record Number: [id number] Patient Account Number: [id number] Date of Birth/Sex: Treating RN: 1951-04-22 (70 y.o. [name] [name] Primary Care Rhyan Wolters: [name] Other Clinician: Referring Yaphet Smethurst: Treating Roanna Reaves/Extender: [name] in Treatment: 3 Encounter Discharge Information Items Discharge Condition: Stable Ambulatory Status: Ambulatory Discharge Destination:  Home Transportation: Private Auto Accompanied By: self Schedule Follow-up Appointment: Yes Clinical Summary of Care: Electronic Signature(s) Signed: 06/06/2021 3:01:11 PM By: Deon Pilling RN, BSN Entered By: Deon Pilling on 06/06/2021 13:36:52 -------------------------------------------------------------------------------- Vitals Details Patient Name: Date of Service: [name] [name] 06/06/2021 10:00 [place] Record Number: [id number] Patient Account Number: [id number] Date of Birth/Sex: Treating RN: May 28, 1951 (70 y.o. [name] [name] Primary Care Lawerance Matsuo: [name] Other Clinician: Referring Braylen Staller: Treating Dazhane Villagomez/Extender: [name] in Treatment: 3 Vital Signs Time Taken: 10:08 Temperature (F): 97.9 Height (in): 74 Pulse (bpm): 65 Weight (lbs): 315 Respiratory Rate (breaths/min): 16 Body Mass Index (BMI): 40.4 Blood Pressure (mmHg): 128/88 Reference Range: 80 - 120 mg / dl Electronic Signature(s) Signed: 06/06/2021 3:01:11 PM By: Deon Pilling RN, BSN Entered By: Deon Pilling on 06/06/2021 13:33:10

## 2021-06-06 NOTE — Progress Notes (Signed)
CEASER, EBELING ([id number]) Visit Report for 06/06/2021 HBO Details Patient Name: Date of Service: [name] [name] 06/06/2021 10:00 [place] Medical Record Number: [id number] Patient Account Number: [id number] Date of Birth/Sex: Treating RN: 1951-03-23 (70 y.o. [name] [name] Primary Care Matthew Pais: [name] Other Clinician: Referring Osceola Holian: Treating Berlyn Malina/Extender: [name] in Treatment: 3 HBO Treatment Course Details Treatment Course Number: 1 Ordering Claudie Rathbone: [name] T Treatments Ordered: otal 40 HBO Treatment Start Date: 05/29/2021 HBO Indication: Late Effect of Radiation HBO Treatment Details Treatment Number: 2 Patient Type: Outpatient Chamber Type: Monoplace Chamber Serial #: [id number] Treatment Protocol: 2.0 ATA with 90 minutes oxygen, and no air breaks Treatment Details Compression Rate Down: 2.0 psi / minute De-Compression Rate Up: 2.0 psi / minute Air breaks and breathing Decompress Decompress Compress Tx Pressure Begins Reached periods Begins Ends (leave unused spaces blank) Chamber Pressure (ATA 1 2 ------2 1 ) Clock Time (24 hr) 10:32 10:50 - - - - - - 12:20 12:35 Treatment Length: 123 (minutes) Treatment Segments: 4 Vital Signs Capillary Blood Glucose Reference Range: 80 - 120 mg / dl HBO Diabetic Blood Glucose Intervention Range: <131 mg/dl or >249 mg/dl Time Vitals Blood Respiratory Capillary Blood Glucose Pulse Action Type: Pulse: Temperature: Taken: Pressure: Rate: Glucose (mg/dl): Meter #: Oximetry (%) Taken: Pre 10:08 128/88 65 16 97.9 Post 12:36 132/88 63 16 98.3 Treatment Response Treatment Toleration: Well Treatment Completion Status: Treatment Completed without Adverse Event Treatment Notes today treatment at 2.0 ATA with no air breaks, MD aware. Patient used Afrin spray to each nostril prior to treatment per ENT Aprile Dickenson made aware. , Sharvil Hoey Notes No concerns with treatment  given. Patient was treated at 2 atm rather than 2.5 [see below] Physician HBO Attestation: I certify that I supervised this HBO treatment in accordance with Medicare guidelines. A trained emergency response team is readily available per Yes hospital policies and procedures. Continue HBOT as ordered. Yes Electronic Signature(s) Signed: 06/06/2021 3:46:55 PM By: Linton Ham MD Previous Signature: 06/06/2021 3:01:11 PM Version By: Deon Pilling RN, BSN Entered By: Linton Ham on 06/06/2021 15:29:36 -------------------------------------------------------------------------------- HBO Safety Checklist Details Patient Name: Date of Service: [name] [name] 06/06/2021 10:00 [place] Record Number: [id number] Patient Account Number: [id number] Date of Birth/Sex: Treating RN: [birth year] (70 y.o. [name] [name] Primary Care Reshonda Koerber: [name] Other Clinician: Referring Elen Acero: Treating Grenda Lora/Extender: [name] in Treatment: 3 HBO Safety Checklist Items Safety Checklist Consent Form Signed Patient voided / foley secured and emptied When did you last eato 0830 Last dose of injectable or oral agent Ostomy pouch emptied and vented if applicable NA All implantable devices assessed, documented and approved NA Intravenous access site secured and place NA Valuables secured Linens and cotton and cotton/polyester blend (less than 51% polyester) Personal oil-based products / skin lotions / body lotions removed Wigs or hairpieces removed NA Smoking or tobacco materials removed NA Books / newspapers / magazines / loose paper removed Cologne, aftershave, perfume and deodorant removed Jewelry removed (may wrap wedding band) NA Make-up removed NA Hair care products removed NA Battery operated devices (external) removed NA Heating patches and chemical warmers removed NA Titanium eyewear removed NA Nail polish cured greater than 10  hours NA Casting material cured greater than 10 hours NA Hearing aids removed NA Loose dentures or partials removed NA Prosthetics have been removed NA Patient demonstrates correct use of air break device (if applicable) Patient concerns have been addressed Patient grounding  bracelet on and cord attached to chamber Specifics for Inpatients (complete in addition to above) Medication sheet sent with patient Intravenous medications needed or due during therapy sent with patient Drainage tubes (e.g. nasogastric tube or chest tube secured and vented) Endotracheal or Tracheotomy tube secured Cuff deflated of air and inflated with saline Airway suctioned Electronic Signature(s) Signed: 06/06/2021 3:01:11 PM By: Deon Pilling RN, BSN Entered By: Deon Pilling on 06/06/2021 13:34:12

## 2021-06-07 ENCOUNTER — Encounter (HOSPITAL_BASED_OUTPATIENT_CLINIC_OR_DEPARTMENT_OTHER): Payer: Medicare Other | Admitting: Internal Medicine

## 2021-06-07 DIAGNOSIS — N3040 Irradiation cystitis without hematuria: Secondary | ICD-10-CM | POA: Diagnosis not present

## 2021-06-07 DIAGNOSIS — L598 Other specified disorders of the skin and subcutaneous tissue related to radiation: Secondary | ICD-10-CM | POA: Diagnosis not present

## 2021-06-07 NOTE — Progress Notes (Signed)
MARIUS, BETTS ([id number]) Visit Report for 06/07/2021 SuperBill Details Patient Name: Date of Service: [name] 06/07/2021 Medical Record Number: [id number] Patient Account Number: [id number] Date of Birth/Sex: Treating RN: July 08, 1950 (70 y.o. 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 C61 Malignant neoplasm of prostate R30.0 Dysuria Z00.00 Encounter for general adult medical examination without abnormal findings Physician Procedures Quantity CPT4 Code Description Modifier [id number] - WC PHYS HYPERBARIC OXYGEN THERAPY 1 ICD-10 Diagnosis Description 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

## 2021-06-07 NOTE — Progress Notes (Addendum)
[name] [name] ([id number]) Visit Report for 06/07/2021 HBO Details Patient Name: Date of Service: [name] [name] 06/07/2021 10:00 A M Medical Record Number: [id number] Patient Account Number: [id number] Date of Birth/Sex: Treating RN: Mar 03, 1951 (70 y.o. [name] [name]) Maye Hides Primary Care Jamira Barfuss: [name] Other Clinician: [name] Referring Doral Digangi: Treating Darcell Yacoub/Extender: [name] in Treatment: 3 HBO Treatment Course Details Treatment Course Number: 1 Ordering Kanchan Gal: [name] T Treatments Ordered: otal 40 HBO Treatment Start Date: 05/29/2021 HBO Indication: Late Effect of Radiation HBO Treatment Details Treatment Number: 3 Patient Type: Outpatient Chamber Type: Monoplace Chamber Serial #: [id number] Treatment Protocol: 2.5 ATA with 90 minutes oxygen, with two 5 minute air breaks Treatment Details Compression Rate Down: 1.5 psi / minute De-Compression Rate Up: 1.5 psi / minute A breaks and breathing ir Compress Tx Pressure periods Decompress Decompress Begins Reached (leave unused spaces Begins Ends blank) Chamber Pressure (ATA 1 2.5 2.5 2.5 2.5 2.5 - - 2.5 1 ) Clock Time (24 hr) 10:16 10:34 11:04 11:09 11:39 11:44 - - 12:14 12:27 Treatment Length: 131 (minutes) Treatment Segments: 4 Vital Signs Capillary Blood Glucose Reference Range: 80 - 120 mg / dl HBO Diabetic Blood Glucose Intervention Range: <131 mg/dl or >249 mg/dl Time Vitals Blood Respiratory Capillary Blood Glucose Pulse Action Type: Pulse: Temperature: Taken: Pressure: Rate: Glucose (mg/dl): Meter #: Oximetry (%) Taken: Pre 09:45 128/77 65 16 97.5 Post 12:33 118/76 62 16 98 Treatment Response Treatment Toleration: Well Treatment Completion Status: Treatment Completed without Adverse Event Treatment Notes Information entered from paper intake sheet. Patient treatment was uneventful. [place] Klyn Kroening Notes No concerns with treatment  given Physician HBO Attestation: I certify that I supervised this HBO treatment in accordance with Medicare guidelines. A trained emergency response team is readily available per Yes hospital policies and procedures. Continue HBOT as ordered. Yes Electronic Signature(s) Signed: 06/07/2021 5:16:25 PM By: Linton Ham MD Previous Signature: 06/07/2021 2:49:13 PM Version By: Maye Hides Previous Signature: 06/07/2021 1:21:22 PM Version By: [name] EMT Previous Signature: 06/07/2021 1:19:44 PM Version By: [name] EMT Entered By: Linton Ham on 06/07/2021 17:15:06 -------------------------------------------------------------------------------- HBO Safety Checklist Details Patient Name: Date of Service: [name] [name] D 06/07/2021 10:00 [place] Number: [id number] Patient Account Number: [id number] Date of Birth/Sex: Treating RN: 07-09-1950 (70 y.o. [name] [name]) Maye Hides Primary Care Marlie Kuennen: [name] Other Clinician: [name] Referring Mayu Ronk: Treating Cordon Gassett/Extender: [name] in Treatment: 3 HBO Safety Checklist Items Safety Checklist Consent Form Signed Patient voided / foley secured and emptied When did you last eato 0800 Last dose of injectable or oral agent NA Ostomy pouch emptied and vented if applicable NA All implantable devices assessed, documented and approved NA Intravenous access site secured and place NA Valuables secured Linens and cotton and cotton/polyester blend (less than 51% polyester) Personal oil-based products / skin lotions / body lotions removed Wigs or hairpieces removed Smoking or tobacco materials removed Books / newspapers / magazines / loose paper removed Cologne, aftershave, perfume and deodorant removed Jewelry removed (may wrap wedding band) Make-up removed NA Hair care products removed NA Battery operated devices (external) removed Heating patches and  chemical warmers removed Titanium eyewear removed NA Nail polish cured greater than 10 hours NA Casting material cured greater than 10 hours NA Hearing aids removed NA Loose dentures or partials removed NA Prosthetics have been removed NA Patient demonstrates correct use of air break device (if applicable) Patient concerns have been addressed Patient grounding  bracelet on and cord attached to chamber Specifics for Inpatients (complete in addition to above) Medication sheet sent with patient NA Intravenous medications needed or due during therapy sent with patient NA Drainage tubes (e.g. nasogastric tube or chest tube secured and vented) NA Endotracheal or Tracheotomy tube secured NA Cuff deflated of air and inflated with saline NA Airway suctioned NA Electronic Signature(s) Signed: 06/07/2021 1:14:31 PM By: [name] EMT Entered By: [name] on 06/07/2021 13:14:31

## 2021-06-07 NOTE — Progress Notes (Addendum)
[name], [name] ([id number]) Visit Report for 06/07/2021 Arrival Information Details Patient Name: Date of Service: [name] [name] 06/07/2021 10:00 [place] Number: [id number] Patient Account Number: [id number] Date of Birth/Sex: Treating RN: [birth year] (70 y.o. [name]) Maye Hides Primary Care Amrom Ore: [name] Other Clinician: [name] Referring Nykia Turko: Treating Dianara Smullen/Extender: [name] in Treatment: 3 Visit Information History Since Last Visit All ordered tests and consults were completed: Yes Patient Arrived: Ambulatory Added or deleted any medications: No Arrival Time: 09:43 Any new allergies or adverse reactions: No Accompanied By: None Had a fall or experienced change in No Transfer Assistance: None activities of daily living that may affect Patient Identification Verified: Yes risk of falls: Secondary Verification Process Completed: Yes Signs or symptoms of abuse/neglect since last visito No Patient Requires Transmission-Based Precautions: No Hospitalized since last visit: No Patient Has Alerts: No Implantable device outside of the clinic excluding No cellular tissue based products placed in the center since last visit: Pain Present Now: No Electronic Signature(s) Signed: 06/07/2021 1:09:55 PM By: [name] EMT Entered By: [name] on 06/07/2021 13:09:55 -------------------------------------------------------------------------------- Encounter Discharge Information Details Patient Name: Date of Service: [name] [name] 06/07/2021 10:00 [place] Record Number: [id number] Patient Account Number: [id number] Date of Birth/Sex: Treating RN: [birth year] (70 y.o. [name]) Maye Hides Primary Care Marcee Jacobs: [name] Other Clinician: [name] Referring Alexah Kivett: Treating Ailyne Pawley/Extender: [name] in Treatment: 3 Encounter Discharge Information  Items Discharge Condition: Stable Ambulatory Status: Ambulatory Discharge Destination: Home Transportation: Private Auto Accompanied By: None Schedule Follow-up Appointment: Yes Clinical Summary of Care: Electronic Signature(s) Signed: 06/07/2021 1:23:26 PM By: [name] EMT Entered By: [name] on 06/07/2021 13:23:26 -------------------------------------------------------------------------------- Patient/Caregiver Education Details Patient Name: Date of Service: [name] [name] 12/29/2022andnbsp10:00 [place] Record Number: [id number] Patient Account Number: [id number] Date of Birth/Gender: Treating RN: 09/18/1950 (70 y.o. [name]) Maye Hides Primary Care Physician: [name] Other Clinician: [name] Referring Physician: Treating Physician/Extender: [name] in Treatment: 3 Education Assessment Education Provided To: Patient Education Topics Provided Electronic Signature(s) Signed: 06/07/2021 1:23:04 PM By: [name] EMT Entered By: [name] on 06/07/2021 13:23:04 -------------------------------------------------------------------------------- Vitals Details Patient Name: Date of Service: [name] [name] 06/07/2021 10:00 [place] Record Number: [id number] Patient Account Number: [id number] Date of Birth/Sex: Treating RN: December 16, 1950 (70 y.o. [name]) Maye Hides Primary Care Flor Whitacre: [name] Other Clinician: [name] Referring Hadley Detloff: Treating Algie Cales/Extender: Rudie Meyer Weeks in Treatment: 3 Vital Signs Time Taken: 09:45 Temperature (F): 97.5 Height (in): 74 Pulse (bpm): 65 Weight (lbs): 315 Respiratory Rate (breaths/min): 16 Body Mass Index (BMI): 40.4 Blood Pressure (mmHg): 128/77 Reference Range: 80 - 120 mg / dl Electronic Signature(s) Signed: 06/07/2021 1:12:59 PM By: [name] EMT Entered By: [name] on 06/07/2021 13:12:58

## 2021-06-08 ENCOUNTER — Encounter (HOSPITAL_BASED_OUTPATIENT_CLINIC_OR_DEPARTMENT_OTHER): Payer: Medicare Other | Admitting: Internal Medicine

## 2021-06-08 ENCOUNTER — Other Ambulatory Visit: Payer: Self-pay

## 2021-06-08 DIAGNOSIS — N3040 Irradiation cystitis without hematuria: Secondary | ICD-10-CM | POA: Diagnosis not present

## 2021-06-08 DIAGNOSIS — C61 Malignant neoplasm of prostate: Secondary | ICD-10-CM | POA: Diagnosis not present

## 2021-06-08 NOTE — Progress Notes (Signed)
MOSS, BERRY ([id number]) Visit Report for 06/08/2021 HBO Details Patient Name: Date of Service: [name] [name] 06/08/2021 10:00 A M Medical Record Number: [id number] Patient Account Number: [id number] Date of Birth/Sex: Treating RN: [birth year] (70 y.o. [name] Primary Care Ramiya Delahunty: [name] Other Clinician: Referring Sheylin Scharnhorst: Treating Kalix Meinecke/Extender: [name]: 3 HBO Treatment Course Details Treatment Course Number: 1 Ordering Danissa Rundle: [name] T Treatments Ordered: otal 40 HBO Treatment Start Date: 05/29/2021 HBO Indication: Late Effect of Radiation HBO Treatment Details Treatment Number: 4 Patient Type: Outpatient Chamber Type: Monoplace Chamber Serial #: [id number] Treatment Protocol: 2.5 ATA with 90 minutes oxygen, with two 5 minute air breaks Treatment Details Compression Rate Down: 2.0 psi / minute De-Compression Rate Up: 2.0 psi / minute A breaks and breathing ir Compress Tx Pressure periods Decompress Decompress Begins Reached (leave unused spaces Begins Ends blank) Chamber Pressure (ATA 1 2.5 2.5 2.5 2.5 2.5 - - 2.5 1 ) Clock Time (24 hr) 10:11 10:24 10:54 10:59 11:29 11:34 - - 11:55 12:07 Treatment Length: 116 (minutes) Treatment Segments: 4 Vital Signs Capillary Blood Glucose Reference Range: 80 - 120 mg / dl HBO Diabetic Blood Glucose Intervention Range: <131 mg/dl or >249 mg/dl Time Vitals Blood Respiratory Capillary Blood Glucose Pulse Action Type: Pulse: Temperature: Taken: Pressure: Rate: Glucose (mg/dl): Meter #: Oximetry (%) Taken: Pre 09:55 128/67 76 18 98.6 Post 12:07 130/76 61 18 98.6 Treatment Response Treatment Completion Status: Treatment Completed without Adverse Event Physician HBO Attestation: I certify that I supervised this HBO treatment in accordance with Medicare guidelines. A trained emergency response team is readily available per Yes hospital policies  and procedures. Continue HBOT as ordered. Yes Electronic Signature(s) Signed: 06/08/2021 1:28:15 PM By: [name] DO Entered By: [name] on 06/08/2021 13:27:50 -------------------------------------------------------------------------------- HBO Safety Checklist Details Patient Name: Date of Service: [name] [name] 06/08/2021 10:00 A M Medical Record Number: [id number] Patient Account Number: [id number] Date of Birth/Sex: Treating RN: 10-03-1950 (70 y.o. [name] Primary Care Izola Teague: [name] Other Clinician: Referring Fran Mcree: Treating Holbert Caples/Extender: [name]: 3 HBO Safety Checklist Items Safety Checklist Consent Form Signed Patient voided / foley secured and emptied When did you last eato 0830 Last dose of injectable or oral agent NA Ostomy pouch emptied and vented if applicable NA All implantable devices assessed, documented and approved NA Intravenous access site secured and place NA Valuables secured Linens and cotton and cotton/polyester blend (less than 51% polyester) Personal oil-based products / skin lotions / body lotions removed Wigs or hairpieces removed Smoking or tobacco materials removed Books / newspapers / magazines / loose paper removed Cologne, aftershave, perfume and deodorant removed Jewelry removed (may wrap wedding band) Make-up removed Hair care products removed Battery operated devices (external) removed Heating patches and chemical warmers removed Titanium eyewear removed Nail polish cured greater than 10 hours NA Casting material cured greater than 10 hours NA Hearing aids removed Loose dentures or partials removed Prosthetics have been removed NA Patient demonstrates correct use of air break device (if applicable) NA Patient concerns have been addressed NA Patient grounding bracelet on and cord attached to chamber NA Specifics for Inpatients  (complete in addition to above) Medication sheet sent with patient NA Intravenous medications needed or due during therapy sent with patient NA Drainage tubes (e.g. nasogastric tube or chest tube secured and vented) NA Endotracheal or Tracheotomy tube secured NA Cuff deflated of air and inflated with saline  NA Airway suctioned NA Electronic Signature(s) Signed: 06/08/2021 2:54:09 PM By: Levan Hurst RN, BSN Entered By: Levan Hurst on 06/08/2021 11:00:42

## 2021-06-08 NOTE — Progress Notes (Signed)
MILBERT, BIXLER ([id number]) Visit Report for 06/08/2021 [place] Details Patient Name: Date of Service: [name] [name] 06/08/2021 Medical Record Number: [id number] Patient Account Number: [id number] Date of Birth/Sex: Treating RN: May 26, 1951 (70 y.o. 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 C61 Malignant neoplasm of prostate R30.0 Dysuria Z00.00 Encounter for general adult medical examination without abnormal findings Facility Procedures CPT4 Code Description Modifier Quantity [id number] G0277-(Facility Use Only) HBOT full body chamber, 24min , 4 Physician Procedures Quantity CPT4 Code Description Modifier [id number] - WC PHYS HYPERBARIC OXYGEN THERAPY 1 ICD-10 Diagnosis Description 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

## 2021-06-08 NOTE — Progress Notes (Signed)
**Note [name]-Identified via Obfuscation** TIRON, SUSKI ([id number]) Visit Report for 06/08/2021 Arrival Information Details Patient Name: Date of Service: [name] [name] 06/08/2021 10:00 [name] [name] Record Number: [id number] Patient Account Number: [id number] Date of Birth/Sex: Treating RN: [birth year] (70 y.o. [name] [name] Primary Care [name] [name]: [name] [name] Other Clinician: Referring [name] [name]: Treating [name] [name]/Extender: [name] [name] Weeks in Treatment: 3 Visit Information History Since Last Visit Added or deleted any medications: No Patient Arrived: Ambulatory Any new allergies or adverse reactions: No Arrival Time: 09:55 Had [name] fall or experienced change in No Accompanied By: alone activities of daily living that may affect Transfer Assistance: None risk of falls: Patient Identification Verified: Yes Signs or symptoms of abuse/neglect since last visito No Secondary Verification Process Completed: Yes Hospitalized since last visit: No Patient Requires Transmission-Based Precautions: No Implantable device outside of the clinic excluding No Patient Has Alerts: No cellular tissue based products placed in the center since last visit: Pain Present Now: No Electronic Signature(s) Signed: 06/08/2021 2:54:09 PM By: [name] Hurst RN, BSN Entered By: [name] [name] on 06/08/2021 10:58:14 -------------------------------------------------------------------------------- Encounter Discharge Information Details Patient Name: Date of Service: [name] [name] 06/08/2021 10:00 [name] [name] Medical Record Number: [id number] Patient Account Number: [id number] Date of Birth/Sex: Treating RN: 01/15/1951 (70 y.o. [name] [name] Primary Care Kharisma Glasner: [name] [name] Other Clinician: Referring [name] [name]: Treating [name] [name]/Extender: [name] [name] in Treatment: 3 Encounter Discharge Information Items Discharge Condition: Stable Ambulatory Status: Ambulatory Discharge  Destination: Home Transportation: Private Auto Accompanied By: alone Schedule Follow-up Appointment: Yes Clinical Summary of Care: Patient Declined Electronic Signature(s) Signed: 06/08/2021 2:54:09 PM By: [name] Hurst RN, BSN Entered By: [name] [name] on 06/08/2021 13:17:34 -------------------------------------------------------------------------------- Vitals Details Patient Name: Date of Service: [name] [name] 06/08/2021 10:00 [name] [name] Record Number: [id number] Patient Account Number: [id number] Date of Birth/Sex: Treating RN: 02/12/1951 (70 y.o. [name] [name] Primary Care [name] [name]: [name] [name] Other Clinician: Referring [name] [name]: Treating [name] [name]/Extender: [name] [name] Weeks in Treatment: 3 Vital Signs Time Taken: 09:55 Temperature (F): 98.6 Height (in): 74 Pulse (bpm): 76 Weight (lbs): 315 Respiratory Rate (breaths/min): 18 Body Mass Index (BMI): 40.4 Blood Pressure (mmHg): 128/67 Reference Range: 80 - 120 mg / dl Electronic Signature(s) Signed: 06/08/2021 2:54:09 PM By: [name] Hurst RN, BSN Entered By: [name] [name] on 06/08/2021 10:58:45

## 2021-06-12 ENCOUNTER — Encounter (HOSPITAL_BASED_OUTPATIENT_CLINIC_OR_DEPARTMENT_OTHER): Payer: Medicare Other | Attending: Internal Medicine | Admitting: Internal Medicine

## 2021-06-12 ENCOUNTER — Encounter (HOSPITAL_BASED_OUTPATIENT_CLINIC_OR_DEPARTMENT_OTHER): Payer: Medicare Other | Admitting: Internal Medicine

## 2021-06-12 ENCOUNTER — Other Ambulatory Visit: Payer: Self-pay

## 2021-06-12 DIAGNOSIS — Z79899 Other long term (current) drug therapy: Secondary | ICD-10-CM | POA: Diagnosis not present

## 2021-06-12 DIAGNOSIS — R300 Dysuria: Secondary | ICD-10-CM | POA: Diagnosis not present

## 2021-06-12 DIAGNOSIS — Y842 Radiological procedure and radiotherapy as the cause of abnormal reaction of the patient, or of later complication, without mention of misadventure at the time of the procedure: Secondary | ICD-10-CM | POA: Diagnosis not present

## 2021-06-12 DIAGNOSIS — C61 Malignant neoplasm of prostate: Secondary | ICD-10-CM | POA: Diagnosis not present

## 2021-06-12 DIAGNOSIS — Z8546 Personal history of malignant neoplasm of prostate: Secondary | ICD-10-CM | POA: Insufficient documentation

## 2021-06-12 DIAGNOSIS — N3040 Irradiation cystitis without hematuria: Secondary | ICD-10-CM

## 2021-06-12 DIAGNOSIS — L598 Other specified disorders of the skin and subcutaneous tissue related to radiation: Secondary | ICD-10-CM | POA: Diagnosis not present

## 2021-06-12 LAB — GLUCOSE, CAPILLARY: Glucose-Capillary: 146 mg/dL — ABNORMAL HIGH (ref 70–99)

## 2021-06-12 NOTE — Progress Notes (Signed)
[facility], [facility] ([id number]) Visit Report for 06/12/2021 Chief Complaint Document Details Patient Name: Date of Service: [name] [name] 06/12/2021 9:30 A M Medical Record Number: [id number] Patient Account Number: [id number] Date of Birth/Sex: Treating RN: 06/21/1950 (71 y.o. [name] [name] Primary Care Provider: Agustina [name] Other Clinician: Referring Provider: Treating Provider/Extender: [name] [name] Weeks in Treatment: 4 Information Obtained from: Patient Chief Complaint Discuss hyperbaric oxygen treatment for radiation cystitis Electronic Signature(s) Signed: 06/12/2021 11:03:01 AM By: [name] [name] Entered By: [name] [name] on 06/12/2021 10:56:52 -------------------------------------------------------------------------------- HPI Details Patient Name: Date of Service: [name] [name] 06/12/2021 9:30 A M Medical Record Number: [id number] Patient Account Number: [id number] Date of Birth/Sex: Treating RN: 1951/05/02 (71 y.o. [name] [name] Primary Care Provider: Agustina [name] Other Clinician: Referring Provider: Treating Provider/Extender: [name] [name] Weeks in Treatment: 4 History of Present Illness HPI Description: Admission 05/15/2021 [name] [name] is a 71 year old male with a past medical history of prostate cancer status post radiation who was developed radiation cystitis. He states he has dysuria. He denies gross hematuria. He currently denies signs of infection. Patient's prostate cancer was diagnosed on 02/27/2017 by [name] [name]. He had intensitymodulated radiation therapy from 09/09/2019 to 10/18/2019 for a total of 28 treatments. Following radiation thearpy he had a cystoscopy and that showed inflammation and edema at the bladder neck/prostate consistent with radiation cystitis. 05/29/2021; patient presents for HBO treatment today. He has no issues or complaints. He reports continued dysuria. He denies  hematuria. He has taken Ativan today to help with claustrophobia. 1/3; patient presents for HBO treatment today. He has completed 5 treatments. After the first 1-2 treatments he developed ear discomfort and followed up with ENT He reports no issues in the past several HBO treatments. He reports improvement in dysuria and continues to have no hematuria. He takes Ativan right . before treatments to help with claustrophobia. Electronic Signature(s) Signed: 06/12/2021 11:03:01 AM By: [name] [name] Entered By: [name] [name] on 06/12/2021 10:58:03 -------------------------------------------------------------------------------- Physical Exam Details Patient Name: Date of Service: [name] [name] 06/12/2021 9:30 A M Medical Record Number: [id number] Patient Account Number: [id number] Date of Birth/Sex: Treating RN: 08-22-1950 (71 y.o. [name] [name] Primary Care Provider: Agustina [name] Other Clinician: Referring Provider: Treating Provider/Extender: [name] [name] Weeks in Treatment: 4 Constitutional respirations regular, non-labored and within target range for patient.[name] Kitchen Psychiatric pleasant and cooperative. Electronic Signature(s) Signed: 06/12/2021 11:03:01 AM By: [name] [name] Entered By: [name] [name] on 06/12/2021 10:59:04 -------------------------------------------------------------------------------- Physician Orders Details Patient Name: Date of Service: [name] [name] 06/12/2021 9:30 A M Medical Record Number: [id number] Patient Account Number: [id number] Date of Birth/Sex: Treating RN: 09/06/1950 (71 y.o. [name] [name] Primary Care Provider: Agustina [name] Other Clinician: Referring Provider: Treating Provider/Extender: [name] [name] Weeks in Treatment: 4 Verbal / Phone Orders: No Diagnosis Coding ICD-10 Coding Code Description N30.40 Irradiation cystitis without hematuria C61 Malignant neoplasm of  prostate R30.0 Dysuria Z00.00 Encounter for general adult medical examination without abnormal findings Follow-up Appointments Return appointment in 1 month. - 30 day evaluation for hyberbaric protocol. Continue hyberbarics. Hyperbaric Oxygen Therapy Evaluate for HBO Therapy Indication: - Radiation Cystitis If appropriate for treatment, begin HBOT per protocol: 2.5 ATA for 90 Minutes with 2 Five (5) Minute A Breaks ir Total Number of Treatments: - 40 One treatments per day (delivered Monday through Friday unless otherwise specified in Special Instructions below): A frin (Oxymetazoline HCL) 0.05% nasal spray -  1 spray in both nostrils daily as needed prior to HBO treatment for difficulty clearing ears Electronic Signature(s) Signed: 06/12/2021 1:38:03 PM By: [name] [name] Signed: 06/12/2021 5:28:06 PM By: [name] [name] Entered By: [name] [name] on 06/12/2021 12:14:15 -------------------------------------------------------------------------------- Problem List Details Patient Name: Date of Service: [name] [name] 06/12/2021 9:30 A M Medical Record Number: [id number] Patient Account Number: [id number] Date of Birth/Sex: Treating RN: 30-Jun-1950 (71 y.o. [name] [name] Primary Care Provider: Agustina [name] Other Clinician: Referring Provider: Treating Provider/Extender: [name] [name] Weeks in Treatment: 4 Active Problems ICD-10 Encounter Code Description Active Date MDM Diagnosis N30.40 Irradiation cystitis without hematuria 05/15/2021 No Yes C61 Malignant neoplasm of prostate 05/15/2021 No Yes R30.0 Dysuria 05/15/2021 No Yes Z00.00 Encounter for general adult medical examination without abnormal findings 05/15/2021 No Yes Inactive Problems Resolved Problems Electronic Signature(s) Signed: 06/12/2021 11:03:01 AM By: [name] [name] Entered By: [name] [name] on 06/12/2021  10:56:32 -------------------------------------------------------------------------------- Progress Note Details Patient Name: Date of Service: [name] [name] 06/12/2021 9:30 A M Medical Record Number: [id number] Patient Account Number: [id number] Date of Birth/Sex: Treating RN: Jul 04, 1950 (71 y.o. [name] [name] Primary Care Provider: Agustina [name] Other Clinician: Referring Provider: Treating Provider/Extender: [name] [name] Weeks in Treatment: 4 Subjective Chief Complaint Information obtained from Patient Discuss hyperbaric oxygen treatment for radiation cystitis History of Present Illness (HPI) Admission 05/15/2021 [name] [name] is a 71 year old male with a past medical history of prostate cancer status post radiation who was developed radiation cystitis. He states he has dysuria. He denies gross hematuria. He currently denies signs of infection. Patient's prostate cancer was diagnosed on 02/27/2017 by [name] [name]. He had intensityoomodulated radiation therapy from 09/09/2019 to 10/18/2019 for a total of 28 treatments. Following radiation thearpy he had a cystoscopy and that showed inflammation and edema at the bladder neck/prostate consistent with radiation cystitis. 05/29/2021; patient presents for HBO treatment today. He has no issues or complaints. He reports continued dysuria. He denies hematuria. He has taken Ativan today to help with claustrophobia. 1/3; patient presents for HBO treatment today. He has completed 5 treatments. After the first 1-2 treatments he developed ear discomfort and followed up with ENT He reports no issues in the past several HBO treatments. He reports improvement in dysuria and continues to have no hematuria. He takes Ativan right . before treatments to help with claustrophobia. Patient History Information obtained from Patient. Family History Cancer - Mother,Father, Diabetes - Mother,Siblings, No family history of  Heart Disease, Hereditary Spherocytosis, Hypertension, Kidney Disease, Lung Disease, Seizures, Stroke, Thyroid Problems, Tuberculosis. Social History Never smoker, Marital Status - Married, Alcohol Use - Never, Drug Use - No History, Caffeine Use - Rarely. Medical History Eyes Denies history of Cataracts, Glaucoma, Optic Neuritis Ear/Nose/Mouth/Throat Denies history of Chronic sinus problems/congestion, Middle ear problems Hematologic/Lymphatic Denies history of Anemia, Hemophilia, Human Immunodeficiency Virus, Lymphedema, Sickle Cell Disease Respiratory Denies history of Aspiration, Asthma, Chronic Obstructive Pulmonary Disease (COPD), Pneumothorax, Sleep Apnea, Tuberculosis Cardiovascular Patient has history of Hypertension Denies history of Angina, Arrhythmia, Congestive Heart Failure, Coronary Artery Disease, Deep Vein Thrombosis, Hypotension, Myocardial Infarction, Peripheral Arterial Disease, Peripheral Venous Disease, Phlebitis, Vasculitis Gastrointestinal Denies history of Cirrhosis , Colitis, Crohnoos, Hepatitis A, Hepatitis B, Hepatitis C Endocrine Denies history of Type I Diabetes, Type II Diabetes Genitourinary Denies history of End Stage Renal Disease Immunological Denies history of Lupus Erythematosus, Raynaudoos, Scleroderma Integumentary (Skin) Denies history of History of Burn Musculoskeletal Denies history of Gout, Rheumatoid  Arthritis, Osteoarthritis, Osteomyelitis Neurologic Denies history of Dementia, Neuropathy, Quadriplegia, Paraplegia, Seizure Disorder Oncologic Patient has history of Received Radiation - 28 treatments 2020 Prostate Ca Denies history of Received Chemotherapy Psychiatric Patient has history of Confinement Anxiety Denies history of Anorexia/bulimia Hospitalization/Surgery History - left ankle fusion 1991. - hernia repair umbilical 15 years ago. Medical A Surgical History Notes nd Cardiovascular hyperlipidemia Gastrointestinal Colon  Polyps diverticulosis Genitourinary hematuria radiation cystitis- currently Objective Constitutional respirations regular, non-labored and within target range for patient.. Vitals Time Taken: 10:36 AM, Height: 74 in, Weight: 315 lbs, BMI: 40.4, Temperature: 98.4 F, Pulse: 65 bpm, Respiratory Rate: 18 breaths/min, Blood Pressure: 112/77 mmHg. Psychiatric pleasant and cooperative. Assessment Active Problems ICD-10 Irradiation cystitis without hematuria Malignant neoplasm of prostate Dysuria Encounter for general adult medical examination without abnormal findings Patient has completed 5 treatments of HBO. He reports improvement in dysuria. He states he followed up with ENT after the first HBO treatment and they recommended a conservative approach of afrin. He no longer has ear discomfort. Re-evaluate in 4 weeks. Plan Follow-up Appointments: Return appointment in 1 month. - 30 day evaluation for hyberbaric protocol. Continue hyberbarics. Hyperbaric Oxygen Therapy: Evaluate for HBO Therapy Indication: - Radiation Cystitis If appropriate for treatment, begin HBOT per protocol: 2.5 ATA for 90 Minutes with 2 Five (5) Minute Air Breaks T Number of Treatments: - 40 otal One treatments per day (delivered Monday through Friday unless otherwise specified in Special Instructions below): Afrin (Oxymetazoline HCL) 0.05% nasal spray - 1 spray in both nostrils daily as needed prior to HBO treatment for difficulty clearing ears 1. Reassess in 4 weeks 2. Continue HBO therapy Electronic Signature(s) Signed: 06/12/2021 1:38:03 PM By: [name] [name] Signed: 06/12/2021 5:28:06 PM By: [name] [name] Previous Signature: 06/12/2021 11:03:01 AM Version By: [name] [name] Entered By: [name] [name] on 06/12/2021 12:16:05 -------------------------------------------------------------------------------- HxROS Details Patient Name: Date of Service: [name] [name] 06/12/2021 9:30 A M Medical  Record Number: [id number] Patient Account Number: [id number] Date of Birth/Sex: Treating RN: 02-15-1951 (71 y.o. [name] [name] Primary Care Provider: Agustina [name] Other Clinician: Referring Provider: Treating Provider/Extender: [name] [name] Weeks in Treatment: 4 Information Obtained From Patient Eyes Medical History: Negative for: Cataracts; Glaucoma; Optic Neuritis Ear/Nose/Mouth/Throat Medical History: Negative for: Chronic sinus problems/congestion; Middle ear problems Hematologic/Lymphatic Medical History: Negative for: Anemia; Hemophilia; Human Immunodeficiency Virus; Lymphedema; Sickle Cell Disease Respiratory Medical History: Negative for: Aspiration; Asthma; Chronic Obstructive Pulmonary Disease (COPD); Pneumothorax; Sleep Apnea; Tuberculosis Cardiovascular Medical History: Positive for: Hypertension Negative for: Angina; Arrhythmia; Congestive Heart Failure; Coronary Artery Disease; Deep Vein Thrombosis; Hypotension; Myocardial Infarction; Peripheral Arterial Disease; Peripheral Venous Disease; Phlebitis; Vasculitis Past Medical History Notes: hyperlipidemia Gastrointestinal Medical History: Negative for: Cirrhosis ; Colitis; Crohns; Hepatitis A; Hepatitis B; Hepatitis C Past Medical History Notes: Colon Polyps diverticulosis Endocrine Medical History: Negative for: Type I Diabetes; Type II Diabetes Genitourinary Medical History: Negative for: End Stage Renal Disease Past Medical History Notes: hematuria radiation cystitis- currently Immunological Medical History: Negative for: Lupus Erythematosus; Raynauds; Scleroderma Integumentary (Skin) Medical History: Negative for: History of Burn Musculoskeletal Medical History: Negative for: Gout; Rheumatoid Arthritis; Osteoarthritis; Osteomyelitis Neurologic Medical History: Negative for: Dementia; Neuropathy; Quadriplegia; Paraplegia; Seizure Disorder Oncologic Medical  History: Positive for: Received Radiation - 28 treatments 2020 Prostate Ca Negative for: Received Chemotherapy Psychiatric Medical History: Positive for: Confinement Anxiety Negative for: Anorexia/bulimia Immunizations Pneumococcal Vaccine: Received Pneumococcal Vaccination: Yes Received Pneumococcal Vaccination On or After 60th Birthday: Yes Implantable Devices No devices  added Hospitalization / Surgery History Type of Hospitalization/Surgery left ankle fusion 1991 hernia repair umbilical 15 years ago Family and Social History Cancer: Yes - Mother,Father; Diabetes: Yes - Mother,Siblings; Heart Disease: No; Hereditary Spherocytosis: No; Hypertension: No; Kidney Disease: No; Lung Disease: No; Seizures: No; Stroke: No; Thyroid Problems: No; Tuberculosis: No; Never smoker; Marital Status - Married; Alcohol Use: Never; Drug Use: No History; Caffeine Use: Rarely; Financial Concerns: No; Food, Clothing or Shelter Needs: No; Support System Lacking: No; Transportation Concerns: No Electronic Signature(s) Signed: 06/12/2021 11:03:01 AM By: [name] [name] Signed: 06/12/2021 5:28:06 PM By: [name] [name] Entered By: [name] [name] on 06/12/2021 10:58:14 -------------------------------------------------------------------------------- [place] Details Patient Name: Date of Service: [name] [name] 06/12/2021 Medical Record Number: [id number] Patient Account Number: [id number] Date of Birth/Sex: Treating RN: [birth year] (71 y.o. [name] [name] Primary Care Provider: Agustina [name] Other Clinician: Referring Provider: Treating Provider/Extender: [name] [name] Weeks in Treatment: 4 Diagnosis Coding ICD-10 Codes Code Description N30.40 Irradiation cystitis without hematuria C61 Malignant neoplasm of prostate R30.0 Dysuria Z00.00 Encounter for general adult medical examination without abnormal findings Facility Procedures CPT4 Code:  [id number] Description: [phone] - WOUND CARE VISIT-LEV 2 EST PT Modifier: Quantity: 1 Physician Procedures : CPT4 Code Description Modifier [id number] - WC PHYS LEVEL 3 - EST PT ICD-10 Diagnosis Description N30.40 Irradiation cystitis without hematuria C61 Malignant neoplasm of prostate R30.0 Dysuria Quantity: 1 Electronic Signature(s) Signed: 06/12/2021 1:38:03 PM By: [name] [name] Signed: 06/12/2021 5:28:06 PM By: [name] [name] Previous Signature: 06/12/2021 11:03:01 AM Version By: [name] [name] Entered By: [name] [name] on 06/12/2021 12:15:27

## 2021-06-12 NOTE — Progress Notes (Signed)
[name] [name], [name] [name] ([id number]) Visit Report for 06/12/2021 Arrival Information Details Patient Name: Date of Service: [name] [name] 06/12/2021 9:30 A M Medical Record Number: [id number] Patient Account Number: [id number] Date of Birth/Sex: Treating RN: Oct 01, 1950 (71 y.o. [name] [name] Primary Care Josimar Corning: [name] Other Clinician: Referring Citlally Captain: Treating Racer Quam/Extender: [name]: 4 Visit Information History Since Last Visit Added or deleted any medications: No Patient Arrived: Ambulatory Any new allergies or adverse reactions: No Arrival Time: 11:30 Had a fall or experienced change in No Accompanied By: self activities of daily living that may affect Transfer Assistance: None risk of falls: Patient Identification Verified: Yes Signs or symptoms of abuse/neglect since last visito No Secondary Verification Process Completed: Yes Hospitalized since last visit: No Patient Requires Transmission-Based Precautions: No Implantable device outside of the clinic excluding No Patient Has Alerts: No cellular tissue based products placed in the center since last visit: Pain Present Now: No Electronic Signature(s) Signed: 06/12/2021 5:28:06 PM By: Deon Pilling RN, BSN Entered By: Deon Pilling on 06/12/2021 12:13:04 -------------------------------------------------------------------------------- Clinic Level of Care Assessment Details Patient Name: Date of Service: [name] [name] 06/12/2021 9:30 A M Medical Record Number: [id number] Patient Account Number: [id number] Date of Birth/Sex: Treating RN: Jul 28, 1950 (71 y.o. [name] [name] Primary Care Marchel Foote: [name] Other Clinician: Referring Emelly Wurtz: Treating Abid Bolla/Extender: [name]: 4 Clinic Level of Care Assessment Items TOOL 4 Quantity Score X- 1 0 Use when only an EandM is performed on FOLLOW-UP  visit ASSESSMENTS - Nursing Assessment / Reassessment X- 1 10 Reassessment of Co-morbidities (includes updates in patient status) X- 1 5 Reassessment of Adherence to Treatment Plan ASSESSMENTS - Wound and Skin A ssessment / Reassessment []  - 0 Simple Wound Assessment / Reassessment - one wound []  - 0 Complex Wound Assessment / Reassessment - multiple wounds []  - 0 Dermatologic / Skin Assessment (not related to wound area) ASSESSMENTS - Focused Assessment []  - 0 Circumferential Edema Measurements - multi extremities []  - 0 Nutritional Assessment / Counseling / Intervention []  - 0 Lower Extremity Assessment (monofilament, tuning fork, pulses) []  - 0 Peripheral Arterial Disease Assessment (using hand held doppler) ASSESSMENTS - Ostomy and/or Continence Assessment and Care []  - 0 Incontinence Assessment and Management []  - 0 Ostomy Care Assessment and Management (repouching, etc.) PROCESS - Coordination of Care X - Simple Patient / Family Education for ongoing care 1 15 []  - 0 Complex (extensive) Patient / Family Education for ongoing care []  - 0 Staff obtains Programmer, systems, Records, T Results / Process Orders est []  - 0 Staff telephones HHA, Nursing Homes / Clarify orders / etc []  - 0 Routine Transfer to another Facility (non-emergent condition) []  - 0 Routine Hospital Admission (non-emergent condition) []  - 0 New Admissions / Biomedical engineer / Ordering NPWT Apligraf, etc. , []  - 0 Emergency Hospital Admission (emergent condition) X- 1 10 Simple Discharge Coordination []  - 0 Complex (extensive) Discharge Coordination PROCESS - Special Needs []  - 0 Pediatric / Minor Patient Management []  - 0 Isolation Patient Management []  - 0 Hearing / Language / Visual special needs []  - 0 Assessment of Community assistance (transportation, [name]/C planning, etc.) []  - 0 Additional assistance / Altered mentation []  - 0 Support Surface(s) Assessment (bed, cushion, seat,  etc.) INTERVENTIONS - Wound Cleansing / Measurement []  - 0 Simple Wound Cleansing - one wound []  - 0 Complex Wound Cleansing - multiple wounds []  - 0 Wound Imaging (photographs -  any number of wounds) []  - 0 Wound Tracing (instead of photographs) []  - 0 Simple Wound Measurement - one wound []  - 0 Complex Wound Measurement - multiple wounds INTERVENTIONS - Wound Dressings []  - 0 Small Wound Dressing one or multiple wounds []  - 0 Medium Wound Dressing one or multiple wounds []  - 0 Large Wound Dressing one or multiple wounds []  - 0 Application of Medications - topical []  - 0 Application of Medications - injection INTERVENTIONS - Miscellaneous []  - 0 External ear exam []  - 0 Specimen Collection (cultures, biopsies, blood, body fluids, etc.) []  - 0 Specimen(s) / Culture(s) sent or taken to Lab for analysis []  - 0 Patient Transfer (multiple staff / Civil Service fast streamer / Similar devices) []  - 0 Simple Staple / Suture removal (25 or less) []  - 0 Complex Staple / Suture removal (26 or more) []  - 0 Hypo / Hyperglycemic Management (close monitor of Blood Glucose) []  - 0 Ankle / Brachial Index (ABI) - do not check if billed separately X- 1 5 Vital Signs Has the patient been seen at the hospital within the last three years: Yes Total Score: 45 Level Of Care: New/Established - Level 2 Electronic Signature(s) Signed: 06/12/2021 5:28:06 PM By: Deon Pilling RN, BSN Entered By: Deon Pilling on 06/12/2021 12:15:19 -------------------------------------------------------------------------------- Encounter Discharge Information Details Patient Name: Date of Service: [name] [name] 06/12/2021 9:30 A M Medical Record Number: [id number] Patient Account Number: [id number] Date of Birth/Sex: Treating RN: 05/23/1951 (71 y.o. [name] [name] Primary Care Aylla Huffine: [name] Other Clinician: Referring Oceana Walthall: Treating Jilliam Bellmore/Extender: [name] in  Treatment: 4 Encounter Discharge Information Items Discharge Condition: Stable Ambulatory Status: Ambulatory Discharge Destination: Home Transportation: Private Auto Accompanied By: self Schedule Follow-up Appointment: Yes Clinical Summary of Care: Electronic Signature(s) Signed: 06/12/2021 5:28:06 PM By: Deon Pilling RN, BSN Entered By: Deon Pilling on 06/12/2021 12:15:44 -------------------------------------------------------------------------------- Lower Extremity Assessment Details Patient Name: Date of Service: [name] [name] 06/12/2021 9:30 A M Medical Record Number: [id number] Patient Account Number: [id number] Date of Birth/Sex: Treating RN: 1950-07-12 (71 y.o. [name] [name] Primary Care Meganne Rita: [name] Other Clinician: Referring Seamus Warehime: Treating Eris Breck/Extender: [name]: 4 Electronic Signature(s) Signed: 06/12/2021 5:28:06 PM By: Deon Pilling RN, BSN Entered By: Deon Pilling on 06/12/2021 12:13:23 -------------------------------------------------------------------------------- Multi Wound Chart Details Patient Name: Date of Service: [name] [name] 06/12/2021 9:30 A M Medical Record Number: [id number] Patient Account Number: [id number] Date of Birth/Sex: Treating RN: 1950-11-14 (71 y.o. [name] [name] Primary Care Stellarose Cerny: Other Clinician: [name] Referring Kaegan Stigler: Treating Treyvin Glidden/Extender: [name]: 4 Wound Assessments Treatment Notes Electronic Signature(s) Signed: 06/12/2021 11:03:01 AM By: [name] DO Signed: 06/12/2021 5:28:06 PM By: Deon Pilling RN, BSN Entered By: [name] on 06/12/2021 10:56:38 -------------------------------------------------------------------------------- [place] Details Patient Name: Date of Service: [name] [name] 06/12/2021 9:30 A M Medical Record Number:  [id number] Patient Account Number: [id number] Date of Birth/Sex: Treating RN: 08-24-1950 (71 y.o. [name] [name] Primary Care Jigar Zielke: [name] Other Clinician: Referring Stacie Knutzen: Treating Chadwick Reiswig/Extender: [name]: 4 Active Inactive HBO Nursing Diagnoses: Anxiety related to feelings of confinement associated with the hyperbaric oxygen chamber Anxiety related to knowledge deficit of hyperbaric oxygen therapy and treatment procedures Goals: Patient and/or family will be able to state/discuss factors appropriate to the management of their disease process during treatment Date Initiated: 05/15/2021 Target Resolution Date:  08/10/2021 Goal Status: Active Patient/caregiver will verbalize understanding of HBO goals, rationale, procedures and potential hazards Date Initiated: 05/15/2021 Target Resolution Date: 08/10/2021 Goal Status: Active Interventions: Administer decongestants, per physician orders, prior to HBO2 Assess and provide for patients comfort related to the hyperbaric environment and equalization of middle ear Assess for signs and symptoms related to adverse events, including but not limited to confinement anxiety, pneumothorax, oxygen toxicity and baurotrauma Notes: Electronic Signature(s) Signed: 06/12/2021 5:28:06 PM By: Deon Pilling RN, BSN Entered By: Deon Pilling on 06/12/2021 12:14:30 -------------------------------------------------------------------------------- Pain Assessment Details Patient Name: Date of Service: [name] [name] 06/12/2021 9:30 A M Medical Record Number: [id number] Patient Account Number: [id number] Date of Birth/Sex: Treating RN: 1950-07-12 (71 y.o. [name] [name] Primary Care Aidyn Sportsman: [name] Other Clinician: Referring Gurnie Duris: Treating Agastya Meister/Extender: [name]: 4 Active Problems Location of Pain Severity and Description of  Pain Patient Has Paino No Site Locations Rate the pain. Current Pain Level: 0 Pain Management and Medication Current Pain Management: Medication: No Cold Application: No Rest: No Massage: No Activity: No T.E.N.S.: No Heat Application: No Leg drop or elevation: No Is the Current Pain Management Adequate: Adequate How does your wound impact your activities of daily livingo Sleep: No Bathing: No Appetite: No Relationship With Others: No Bladder Continence: No Emotions: No Bowel Continence: No Work: No Toileting: No Drive: No Dressing: No Hobbies: No Engineer, maintenance) Signed: 06/12/2021 5:28:06 PM By: Deon Pilling RN, BSN Entered By: Deon Pilling on 06/12/2021 12:13:16 -------------------------------------------------------------------------------- Patient/Caregiver Education Details Patient Name: Date of Service: [name] [name] 1/3/2023andnbsp9:30 A M Medical Record Number: [id number] Patient Account Number: [id number] Date of Birth/Gender: Treating RN: 1951-03-12 (71 y.o. [name] [name] Primary Care Physician: [name] Other Clinician: Referring Physician: Treating Physician/Extender: [name] in Treatment: 4 Education Assessment Education Provided To: Patient Education Topics Provided Hyperbaric Oxygenation: Handouts: Hyperbaric Oxygen Methods: Explain/Verbal Responses: Reinforcements needed Electronic Signature(s) Signed: 06/12/2021 5:28:06 PM By: Deon Pilling RN, BSN Entered By: Deon Pilling on 06/12/2021 12:14:48 -------------------------------------------------------------------------------- Vitals Details Patient Name: Date of Service: [name] [name] 06/12/2021 9:30 A M Medical Record Number: [id number] Patient Account Number: [id number] Date of Birth/Sex: Treating RN: Sep 18, 1950 (71 y.o. [name] [name] Primary Care Issaih Kaus: [name] Other Clinician: Referring Debrah Granderson: Treating  Arseniy Toomey/Extender: [name]: 4 Vital Signs Time Taken: 10:36 Temperature (F): 98.4 Height (in): 74 Pulse (bpm): 65 Weight (lbs): 315 Respiratory Rate (breaths/min): 18 Body Mass Index (BMI): 40.4 Blood Pressure (mmHg): 112/77 Reference Range: 80 - 120 mg / dl Electronic Signature(s) Signed: 06/12/2021 11:07:04 AM By: Donavan Burnet CHT EMT BS , , Entered By: Donavan Burnet on 06/12/2021 11:07:03

## 2021-06-12 NOTE — Progress Notes (Signed)
NAGEE, GOATES ([id number]) Visit Report for 06/12/2021 SuperBill Details Patient Name: Date of Service: [name] 06/12/2021 Medical Record Number: [id number] Patient Account Number: [id number] Date of Birth/Sex: Treating RN: 1951/01/04 (71 y.o. 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

## 2021-06-13 ENCOUNTER — Encounter (HOSPITAL_BASED_OUTPATIENT_CLINIC_OR_DEPARTMENT_OTHER): Payer: Medicare Other | Admitting: Physician Assistant

## 2021-06-13 DIAGNOSIS — N3040 Irradiation cystitis without hematuria: Secondary | ICD-10-CM | POA: Diagnosis not present

## 2021-06-13 DIAGNOSIS — L598 Other specified disorders of the skin and subcutaneous tissue related to radiation: Secondary | ICD-10-CM | POA: Diagnosis not present

## 2021-06-13 DIAGNOSIS — Z79899 Other long term (current) drug therapy: Secondary | ICD-10-CM | POA: Diagnosis not present

## 2021-06-13 DIAGNOSIS — R300 Dysuria: Secondary | ICD-10-CM | POA: Diagnosis not present

## 2021-06-13 NOTE — Progress Notes (Addendum)
[name], [name] ([id number]) Visit Report for 06/13/2021 HBO Details Patient Name: Date of Service: [name] [name] 06/13/2021 7:30 A M Medical Record Number: [id number] Patient Account Number: [id number] Date of Birth/Sex: Treating RN: February 03, 1951 (70 y.o. [name] [name] Primary Care Saide Lanuza: [name] Other Clinician: Donavan Burnet Referring Starletta Houchin: Treating Albaro Deviney/Extender: [name] Weeks in Treatment: 4 HBO Treatment Course Details Treatment Course Number: 1 Ordering Daishaun Ayre: [name] T Treatments Ordered: otal 40 HBO Treatment Start Date: 05/29/2021 HBO Indication: Late Effect of Radiation HBO Treatment Details Treatment Number: 6 Patient Type: Outpatient Chamber Type: Monoplace Chamber Serial #: [id number] Treatment Protocol: 2.5 ATA with 90 minutes oxygen, with two 5 minute air breaks Treatment Details Compression Rate Down: 1.5 psi / minute De-Compression Rate Up: 2.0 psi / minute A breaks and breathing ir Compress Tx Pressure periods Decompress Decompress Begins Reached (leave unused spaces Begins Ends blank) Chamber Pressure (ATA 1 2.5 2.5 2.5 2.5 2.5 - - 2.5 1 ) Clock Time (24 hr) 07:56 08:15 08:45 08:50 09:20 09:25 - - 09:55 10:06 Treatment Length: 130 (minutes) Treatment Segments: 4 Vital Signs Capillary Blood Glucose Reference Range: 80 - 120 mg / dl HBO Diabetic Blood Glucose Intervention Range: <131 mg/dl or >249 mg/dl Time Vitals Blood Respiratory Capillary Blood Glucose Pulse Action Type: Pulse: Temperature: Taken: Pressure: Rate: Glucose (mg/dl): Meter #: Oximetry (%) Taken: Pre 07:51 115/69 77 18 98.4 Post 10:09 117/78 66 18 97.9 Treatment Response Treatment Toleration: Well Treatment Completion Status: Treatment Completed without Adverse Event Treatment Notes Travel rate was avg 1.2 psi per minute starting at 1 psi from ambient pressure to 6 psi, 1.5 psi/min starting at 6 psi, 2 psi starting at  2 ATA (14.7 psi). Electronic Signature(s) Signed: 06/13/2021 11:30:41 AM By: Donavan Burnet CHT EMT BS , , Signed: 06/13/2021 4:51:59 PM By: Worthy Keeler PA-C Previous Signature: 06/13/2021 11:30:05 AM Version By: Donavan Burnet CHT EMT BS , , Entered By: Donavan Burnet on 06/13/2021 11:30:40 -------------------------------------------------------------------------------- HBO Safety Checklist Details Patient Name: Date of Service: [name] [name] 06/13/2021 7:30 A M Medical Record Number: [id number] Patient Account Number: [id number] Date of Birth/Sex: Treating RN: 03/08/1951 (71 y.o. [name] [name] Primary Care Taunya Goral: [name] Other Clinician: Donavan Burnet Referring Kynzley Dowson: Treating Nyimah Shadduck/Extender: [name] Weeks in Treatment: 4 HBO Safety Checklist Items Safety Checklist Consent Form Signed Patient voided / foley secured and emptied When did you last eato 0615 Last dose of injectable or oral agent n/a Ostomy pouch emptied and vented if applicable NA All implantable devices assessed, documented and approved NA Intravenous access site secured and place NA Valuables secured Linens and cotton and cotton/polyester blend (less than 51% polyester) Personal oil-based products / skin lotions / body lotions removed Wigs or hairpieces removed NA Smoking or tobacco materials removed NA Books / newspapers / magazines / loose paper removed Cologne, aftershave, perfume and deodorant removed Jewelry removed (may wrap wedding band) Make-up removed NA Hair care products removed Battery operated devices (external) removed Heating patches and chemical warmers removed Titanium eyewear removed NA Nail polish cured greater than 10 hours NA Casting material cured greater than 10 hours NA Hearing aids removed NA Loose dentures or partials removed NA Prosthetics have been removed NA Patient demonstrates correct use of air  break device (if applicable) Patient concerns have been addressed Patient grounding bracelet on and cord attached to chamber Specifics for Inpatients (complete in addition to above) Medication sheet sent with patient  NA Intravenous medications needed or due during therapy sent with patient NA Drainage tubes (e.g. nasogastric tube or chest tube secured and vented) NA Endotracheal or Tracheotomy tube secured NA Cuff deflated of air and inflated with saline NA Airway suctioned NA Notes Paper version used prior to treatment. Electronic Signature(s) Signed: 06/13/2021 8:46:03 AM By: Donavan Burnet CHT EMT BS , , Entered By: Donavan Burnet on 06/13/2021 08:46:02

## 2021-06-13 NOTE — Progress Notes (Signed)
[name] [name], [name] [name] ([id number]) Visit Report for 06/12/2021 HBO Details Patient Name: Date of Service: [name] [name] 06/12/2021 7:30 A M Medical Record Number: [id number] Patient Account Number: [id number] Date of Birth/Sex: Treating RN: 11/16/1950 (71 y.o. [name] Primary Care Kenwood Rosiak: [name] Other Clinician: Donavan Burnet Referring Lilyanah Celestin: Treating Sudeep Scheibel/Extender: [name] in Treatment: 4 HBO Treatment Course Details Treatment Course Number: 1 Ordering Tashica Provencio: [name] T Treatments Ordered: otal 40 HBO Treatment Start Date: 05/29/2021 HBO Indication: Late Effect of Radiation HBO Treatment Details Treatment Number: 5 Patient Type: Outpatient Chamber Type: Monoplace Chamber Serial #: [id number] Treatment Protocol: 2.5 ATA with 90 minutes oxygen, with two 5 minute air breaks Treatment Details Compression Rate Down: 1.0 psi / minute De-Compression Rate Up: 2.0 psi / minute A breaks and breathing ir Compress Tx Pressure periods Decompress Decompress Begins Reached (leave unused spaces Begins Ends blank) Chamber Pressure (ATA 1 2.5 2.5 2.5 2.5 2.5 - - 2.5 1 ) Clock Time (24 hr) 08:13 08:35 09:05 09:10 09:40 09:45 - - 10:15 10:26 Treatment Length: 133 (minutes) Treatment Segments: 4 Vital Signs Capillary Blood Glucose Reference Range: 80 - 120 mg / dl HBO Diabetic Blood Glucose Intervention Range: <131 mg/dl or >249 mg/dl Time Vitals Blood Respiratory Capillary Blood Glucose Pulse Action Type: Pulse: Temperature: Taken: Pressure: Rate: Glucose (mg/dl): Meter #: Oximetry (%) Taken: Pre 08:04 132/71 82 18 98.6 Post 10:36 112/77 65 18 98.4 Treatment Response Treatment Toleration: Well Treatment Completion Status: Treatment Completed without Adverse Event Dominga Mcduffie Notes No concerns with treatment given Physician HBO Attestation: I certify that I supervised this HBO treatment in accordance with  Medicare guidelines. A trained emergency response team is readily available per Yes hospital policies and procedures. Continue HBOT as ordered. Yes Electronic Signature(s) Signed: 06/12/2021 3:57:23 PM By: Linton Ham MD Previous Signature: 06/12/2021 11:10:17 AM Version By: Donavan Burnet CHT EMT BS , , Previous Signature: 06/12/2021 10:54:59 AM Version By: Donavan Burnet CHT EMT BS , , Entered By: Linton Ham on 06/12/2021 14:58:44 -------------------------------------------------------------------------------- HBO Safety Checklist Details Patient Name: Date of Service: [name] 06/12/2021 7:30 A M Medical Record Number: [id number] Patient Account Number: [id number] Date of Birth/Sex: Treating RN: 06-15-1950 (71 y.o. [name] Primary Care Quang Thorpe: [name] Other Clinician: Donavan Burnet Referring Tollie Canada: Treating Dmitry Macomber/Extender: [name] in Treatment: 4 HBO Safety Checklist Items Safety Checklist Consent Form Signed Patient voided / foley secured and emptied When did you last eato 0700 Last dose of injectable or oral agent n/a Ostomy pouch emptied and vented if applicable NA All implantable devices assessed, documented and approved NA Intravenous access site secured and place NA Valuables secured Linens and cotton and cotton/polyester blend (less than 51% polyester) Personal oil-based products / skin lotions / body lotions removed Wigs or hairpieces removed NA Smoking or tobacco materials removed NA Books / newspapers / magazines / loose paper removed Cologne, aftershave, perfume and deodorant removed Jewelry removed (may wrap wedding band) Make-up removed NA Hair care products removed Battery operated devices (external) removed Heating patches and chemical warmers removed NA Titanium eyewear removed NA Nail polish cured greater than 10 hours NA Casting material cured greater than 10  hours NA Hearing aids removed NA Loose dentures or partials removed NA Prosthetics have been removed NA Patient demonstrates correct use of air break device (if applicable) Patient concerns have been addressed Patient grounding bracelet on and cord attached to chamber Specifics for Inpatients (complete in  addition to above) Medication sheet sent with patient NA Intravenous medications needed or due during therapy sent with patient NA Drainage tubes (e.g. nasogastric tube or chest tube secured and vented) NA Endotracheal or Tracheotomy tube secured NA Cuff deflated of air and inflated with saline NA Airway suctioned NA Notes Paper version used prior to treatment. Electronic Signature(s) Signed: 06/13/2021 9:15:26 AM By: Donavan Burnet CHT EMT BS , , Entered By: Donavan Burnet on 06/12/2021 10:32:47

## 2021-06-13 NOTE — Progress Notes (Addendum)
[name], [name] ([id number]) Visit Report for 06/13/2021 Arrival Information Details Patient Name: Date of Service: [name] [name] 06/13/2021 7:30 A M Medical Record Number: [id number] Patient Account Number: [id number] Date of Birth/Sex: Treating RN: [birth year] (71 y.o. [name] [name] Primary Care [name] [name]: [name] [name] Other Clinician: Donavan [name] Referring [name] [name]: Treating [name] [name]/Extender: [name] [name] in Treatment: 4 Visit Information History Since Last Visit All ordered tests and consults were completed: Yes Patient Arrived: Ambulatory Added or deleted any medications: No Arrival Time: 07:34 Any new allergies or adverse reactions: No Accompanied By: self Had a fall or experienced change in No Transfer Assistance: None activities of daily living that may affect Patient Identification Verified: Yes risk of falls: Secondary Verification Process Completed: Yes Signs or symptoms of abuse/neglect since last visito No Patient Requires Transmission-Based Precautions: No Hospitalized since last visit: No Patient Has Alerts: No Implantable device outside of the clinic excluding No cellular tissue based products placed in the center since last visit: Pain Present Now: No Electronic Signature(s) Signed: 06/13/2021 8:43:29 AM By: [name] [name] CHT EMT BS , , Entered By: [name] [name] on 06/13/2021 08:43:28 -------------------------------------------------------------------------------- Encounter Discharge Information Details Patient Name: Date of Service: [name] [name] 06/13/2021 7:30 A M Medical Record Number: [id number] Patient Account Number: [id number] Date of Birth/Sex: Treating RN: [birth year] (71 y.o. [name] [name] Primary Care [name] [name]: [name] [name] Other Clinician: Donavan [name] Referring [name] [name]: Treating [name] [name]/Extender: [name] [name] in Treatment: 4 Encounter Discharge  Information Items Discharge Condition: Stable Ambulatory Status: Ambulatory Discharge Destination: Home Transportation: Private Auto Accompanied By: self Schedule Follow-up Appointment: No Clinical Summary of Care: Electronic Signature(s) Signed: 06/13/2021 11:31:58 AM By: [name] [name] CHT EMT BS , , Entered By: [name] [name] on 06/13/2021 11:31:58 -------------------------------------------------------------------------------- Vitals Details Patient Name: Date of Service: [name] [name] 06/13/2021 7:30 A M Medical Record Number: [id number] Patient Account Number: [id number] Date of Birth/Sex: Treating RN: February 28, 1951 (71 y.o. [name] [name] Primary Care [name] [name]: [name] [name] Other Clinician: Valeria [name] Referring [name] [name]: Treating [name] [name]/Extender: [name] [name] Weeks in Treatment: 4 Vital Signs Time Taken: 07:51 Temperature (F): 98.4 Height (in): 74 Pulse (bpm): 77 Weight (lbs): 315 Respiratory Rate (breaths/min): 18 Body Mass Index (BMI): 40.4 Blood Pressure (mmHg): 115/69 Reference Range: 80 - 120 mg / dl Electronic Signature(s) Signed: 06/13/2021 8:44:21 AM By: [name] [name] CHT EMT BS , , Entered By: [name] [name] on 06/13/2021 08:44:21

## 2021-06-13 NOTE — Progress Notes (Signed)
FREDDI, SCHRAGER ([id number]) Visit Report for 06/12/2021 Arrival Information Details Patient Name: Date of Service: [name] [name] 06/12/2021 7:30 A M Medical Record Number: [id number] Patient Account Number: [id number] Date of Birth/Sex: Treating RN: [birth year] (71 y.o. [name] [name] Primary Care Denali Becvar: [name] Other Clinician: Donavan Burnet Referring Dax Murguia: Treating Fredrik Mogel/Extender: [name] in Treatment: 4 Visit Information History Since Last Visit All ordered tests and consults were completed: Yes Patient Arrived: Ambulatory Added or deleted any medications: No Arrival Time: 07:47 Any new allergies or adverse reactions: No Accompanied By: self Had a fall or experienced change in No Transfer Assistance: None activities of daily living that may affect Patient Identification Verified: Yes risk of falls: Secondary Verification Process Completed: Yes Signs or symptoms of abuse/neglect since last visito No Patient Requires Transmission-Based Precautions: No Hospitalized since last visit: No Patient Has Alerts: No Implantable device outside of the clinic excluding No cellular tissue based products placed in the center since last visit: Pain Present Now: No Electronic Signature(s) Signed: 06/13/2021 9:15:26 AM By: Donavan Burnet CHT EMT BS , , Entered By: Donavan Burnet on 06/12/2021 10:23:59 -------------------------------------------------------------------------------- Encounter Discharge Information Details Patient Name: Date of Service: [name] [name] 06/12/2021 7:30 A M Medical Record Number: [id number] Patient Account Number: [id number] Date of Birth/Sex: Treating RN: 1950-09-30 (71 y.o. [name] [name] Primary Care Nehemias [name]: [name] Other Clinician: Donavan Burnet Referring Lenda Baratta: Treating Kwasi Joung/Extender: [name] in Treatment: 4 Encounter Discharge  Information Items Discharge Condition: Stable Ambulatory Status: Ambulatory Discharge Destination: Home Transportation: Private Auto Accompanied By: self Schedule Follow-up Appointment: No Clinical Summary of Care: Electronic Signature(s) Signed: 06/12/2021 1:52:42 PM By: Donavan Burnet CHT EMT BS , , Entered By: Donavan Burnet on 06/12/2021 13:52:42 -------------------------------------------------------------------------------- Vitals Details Patient Name: Date of Service: [name] [name] 06/12/2021 7:30 A M Medical Record Number: [id number] Patient Account Number: [id number] Date of Birth/Sex: Treating RN: 18-Jun-1950 (71 y.o. [name] [name] Primary Care Ruthell Feigenbaum: [name] Other Clinician: Donavan Burnet Referring Lyam Provencio: Treating Nusrat Encarnacion/Extender: [name] in Treatment: 4 Vital Signs Time Taken: 08:04 Temperature (F): 132/71 Height (in): 74 Pulse (bpm): 82 Weight (lbs): 315 Respiratory Rate (breaths/min): 18 Body Mass Index (BMI): 40.4 Blood Pressure (mmHg): 132/71 Reference Range: 80 - 120 mg / dl Electronic Signature(s) Signed: 06/13/2021 9:15:26 AM By: Donavan Burnet CHT EMT BS , , Entered By: Donavan Burnet on 06/12/2021 10:31:40

## 2021-06-13 NOTE — Progress Notes (Signed)
[name], [name] ([id number]) Visit Report for 06/13/2021 SuperBill Details Patient Name: Date of Service: [name] [name] 06/13/2021 Medical Record Number: [id number] Patient Account Number: [id number] Date of Birth/Sex: Treating RN: 1950/08/21 (71 y.o. 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

## 2021-06-14 ENCOUNTER — Encounter (HOSPITAL_BASED_OUTPATIENT_CLINIC_OR_DEPARTMENT_OTHER): Payer: Medicare Other | Admitting: Internal Medicine

## 2021-06-14 ENCOUNTER — Other Ambulatory Visit: Payer: Self-pay

## 2021-06-14 DIAGNOSIS — R300 Dysuria: Secondary | ICD-10-CM | POA: Diagnosis not present

## 2021-06-14 DIAGNOSIS — L598 Other specified disorders of the skin and subcutaneous tissue related to radiation: Secondary | ICD-10-CM | POA: Diagnosis not present

## 2021-06-14 DIAGNOSIS — N3040 Irradiation cystitis without hematuria: Secondary | ICD-10-CM | POA: Diagnosis not present

## 2021-06-14 DIAGNOSIS — Z79899 Other long term (current) drug therapy: Secondary | ICD-10-CM | POA: Diagnosis not present

## 2021-06-14 NOTE — Progress Notes (Addendum)
[name] [name] ([id number]) Visit Report for 06/14/2021 HBO Details Patient Name: Date of Service: [name] [name] 06/14/2021 7:30 A M Medical Record Number: [id number] Patient Account Number: [id number] Date of Birth/Sex: Treating RN: 23-Sep-1950 (71 y.o. [name] [name] Primary Care Hedwig Mcfall: [name] Other Clinician: Donavan Burnet Referring Devell Parkerson: Treating Marinell Igarashi/Extender: [name] in Treatment: 4 HBO Treatment Course Details Treatment Course Number: 1 Ordering Jamaul Heist: [name] T Treatments Ordered: otal 40 HBO Treatment Start Date: 05/29/2021 HBO Indication: Late Effect of Radiation HBO Treatment Details Treatment Number: 7 Patient Type: Outpatient Chamber Type: Monoplace Chamber Serial #: [id number] Treatment Protocol: 2.5 ATA with 90 minutes oxygen, with two 5 minute air breaks Treatment Details Compression Rate Down: 1.5 psi / minute De-Compression Rate Up: 2.0 psi / minute A breaks and breathing ir Compress Tx Pressure periods Decompress Decompress Begins Reached (leave unused spaces Begins Ends blank) Chamber Pressure (ATA 1 2.5 2.5 2.5 2.5 2.5 - - 2.5 1 ) Clock Time (24 hr) 08:05 08:20 08:50 08:55 09:25 09:30 - - 10:00 10:12 Treatment Length: 127 (minutes) Treatment Segments: 4 Vital Signs Capillary Blood Glucose Reference Range: 80 - 120 mg / dl HBO Diabetic Blood Glucose Intervention Range: <131 mg/dl or >249 mg/dl Time Vitals Blood Respiratory Capillary Blood Glucose Pulse Action Type: Pulse: Temperature: Taken: Pressure: Rate: Glucose (mg/dl): Meter #: Oximetry (%) Taken: Pre 07:57 131/75 85 18 98 100 Post 10:13 118/80 70 18 98.2 Treatment Response Treatment Toleration: Well Treatment Completion Status: Treatment Completed without Adverse Event Sayan Aldava Notes No concerns with treatment given Physician HBO Attestation: I certify that I supervised this HBO treatment in accordance with  Medicare guidelines. A trained emergency response team is readily available per Yes hospital policies and procedures. Continue HBOT as ordered. Yes Electronic Signature(s) Signed: 06/14/2021 4:38:12 PM By: Linton Ham MD Previous Signature: 06/14/2021 11:05:35 AM Version By: Donavan Burnet CHT EMT BS , , Entered By: Linton Ham on 06/14/2021 16:33:47 -------------------------------------------------------------------------------- HBO Safety Checklist Details Patient Name: Date of Service: [name] [name] 06/14/2021 7:30 A M Medical Record Number: [id number] Patient Account Number: [id number] Date of Birth/Sex: Treating RN: November 25, 1950 (71 y.o. [name] [name], Meta.Reding Primary Care Jahmier Willadsen: [name] Other Clinician: Donavan Burnet Referring Strummer Canipe: Treating Zak Gondek/Extender: [name] in Treatment: 4 HBO Safety Checklist Items Safety Checklist Consent Form Signed Patient voided / foley secured and emptied When did you last eato 0610 Last dose of injectable or oral agent n/a Ostomy pouch emptied and vented if applicable NA All implantable devices assessed, documented and approved NA Intravenous access site secured and place NA Valuables secured Linens and cotton and cotton/polyester blend (less than 51% polyester) Personal oil-based products / skin lotions / body lotions removed Wigs or hairpieces removed NA Smoking or tobacco materials removed NA Books / newspapers / magazines / loose paper removed Cologne, aftershave, perfume and deodorant removed Jewelry removed (may wrap wedding band) Make-up removed NA Hair care products removed Battery operated devices (external) removed Heating patches and chemical warmers removed Titanium eyewear removed NA Nail polish cured greater than 10 hours NA Casting material cured greater than 10 hours NA Hearing aids removed NA Loose dentures or partials removed NA Prosthetics have  been removed NA Patient demonstrates correct use of air break device (if applicable) Patient concerns have been addressed Patient grounding bracelet on and cord attached to chamber Specifics for Inpatients (complete in addition to above) Medication sheet sent with patient NA Intravenous medications needed or due  during therapy sent with patient NA Drainage tubes (e.g. nasogastric tube or chest tube secured and vented) NA Endotracheal or Tracheotomy tube secured NA Cuff deflated of air and inflated with saline NA Airway suctioned NA Notes Paper version used prior to treatment. Electronic Signature(s) Signed: 06/14/2021 1:10:39 PM By: Donavan Burnet CHT EMT BS , , Previous Signature: 06/14/2021 8:58:16 AM Version By: Donavan Burnet CHT EMT BS , , Entered By: Donavan Burnet on 06/14/2021 13:10:39

## 2021-06-14 NOTE — Progress Notes (Addendum)
CLEBURNE, SAVINI ([id number]) Visit Report for 06/14/2021 Arrival Information Details Patient Name: Date of Service: [name] 06/14/2021 7:30 A M Medical Record Number: [id number] Patient Account Number: [id number] Date of Birth/Sex: Treating RN: 1950-07-31 (71 y.o. Lorette Ang, Meta.Reding Primary Care Deliliah Spranger: [name] Other Clinician: Donavan Burnet Referring Kalah Pflum: Treating Myrka Sylva/Extender: [name] in Treatment: 4 Visit Information History Since Last Visit All ordered tests and consults were completed: Yes Patient Arrived: Ambulatory Added or deleted any medications: No Arrival Time: 07:45 Any new allergies or adverse reactions: No Accompanied By: self Had a fall or experienced change in No Transfer Assistance: None activities of daily living that may affect Patient Identification Verified: Yes risk of falls: Secondary Verification Process Completed: Yes Signs or symptoms of abuse/neglect since last visito No Patient Requires Transmission-Based Precautions: No Hospitalized since last visit: No Patient Has Alerts: No Implantable device outside of the clinic excluding No cellular tissue based products placed in the center since last visit: Pain Present Now: No Electronic Signature(s) Signed: 06/14/2021 8:47:01 AM By: Donavan Burnet CHT EMT BS , , Entered By: Donavan Burnet on 06/14/2021 08:47:01 -------------------------------------------------------------------------------- Encounter Discharge Information Details Patient Name: Date of Service: [name] 06/14/2021 7:30 A M Medical Record Number: [id number] Patient Account Number: [id number] Date of Birth/Sex: Treating RN: [birth year] (71 y.o. [name] Primary Care Elo Marmolejos: [name] Other Clinician: Donavan Burnet Referring Kenith Trickel: Treating Saori Umholtz/Extender: [name] in Treatment: 4 Encounter Discharge  Information Items Discharge Condition: Stable Ambulatory Status: Ambulatory Discharge Destination: Home Transportation: Private Auto Accompanied By: self Schedule Follow-up Appointment: No Clinical Summary of Care: Electronic Signature(s) Signed: 06/14/2021 11:07:03 AM By: Donavan Burnet CHT EMT BS , , Entered By: Donavan Burnet on 06/14/2021 11:07:02 -------------------------------------------------------------------------------- Vitals Details Patient Name: Date of Service: [name] 06/14/2021 7:30 A M Medical Record Number: [id number] Patient Account Number: [id number] Date of Birth/Sex: Treating RN: 1951/06/05 (71 y.o. [name] Primary Care Rajendra Spiller: [name] Other Clinician: Donavan Burnet Referring Lyric Rossano: Treating Arriona Prest/Extender: [name] in Treatment: 4 Vital Signs Time Taken: 07:57 Temperature (F): 98.0 Height (in): 74 Pulse (bpm): 85 Weight (lbs): 315 Respiratory Rate (breaths/min): 18 Body Mass Index (BMI): 40.4 Blood Pressure (mmHg): 131/75 Reference Range: 80 - 120 mg / dl Airway Pulse Oximetry (%): 100 Electronic Signature(s) Signed: 06/14/2021 8:48:03 AM By: Donavan Burnet CHT EMT BS , , Entered By: Donavan Burnet on 06/14/2021 08:48:03

## 2021-06-14 NOTE — Progress Notes (Signed)
[name], STUCK ([id number]) Visit Report for 06/14/2021 SuperBill Details Patient Name: Date of Service: [name] [name] 06/14/2021 Medical Record Number: [id number] Patient Account Number: [id number] Date of Birth/Sex: Treating RN: 1951-02-07 (71 y.o. 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

## 2021-06-15 ENCOUNTER — Encounter (HOSPITAL_BASED_OUTPATIENT_CLINIC_OR_DEPARTMENT_OTHER): Payer: Medicare Other | Admitting: Internal Medicine

## 2021-06-15 DIAGNOSIS — N3040 Irradiation cystitis without hematuria: Secondary | ICD-10-CM | POA: Diagnosis not present

## 2021-06-15 DIAGNOSIS — Z79899 Other long term (current) drug therapy: Secondary | ICD-10-CM | POA: Diagnosis not present

## 2021-06-15 DIAGNOSIS — C61 Malignant neoplasm of prostate: Secondary | ICD-10-CM

## 2021-06-15 DIAGNOSIS — R300 Dysuria: Secondary | ICD-10-CM | POA: Diagnosis not present

## 2021-06-15 NOTE — Progress Notes (Addendum)
[name] [name], [name] [name] ([id number]) Visit Report for 06/15/2021 HBO Details Patient Name: Date of Service: [name] [name] 06/15/2021 7:30 A M Medical Record Number: [id number] Patient Account Number: [id number] Date of Birth/Sex: Treating RN: 24-Apr-1951 (71 y.o. [name] Primary Care Kage Willmann: [name] Other Clinician: [name] Referring Julane Crock: Treating Sharanda Shinault/Extender: [name]: 4 HBO Treatment Course Details Treatment Course Number: 1 Ordering Riane Rung: [name] T Treatments Ordered: otal 40 HBO Treatment Start Date: 05/29/2021 HBO Indication: Late Effect of Radiation HBO Treatment Details Treatment Number: 8 Patient Type: Outpatient Chamber Type: Monoplace Chamber Serial #: [id number] Treatment Protocol: 2.5 ATA with 90 minutes oxygen, with two 5 minute air breaks Treatment Details Compression Rate Down: 1.0 psi / minute De-Compression Rate Up: 1.5 psi / minute A breaks and breathing ir Compress Tx Pressure periods Decompress Decompress Begins Reached (leave unused spaces Begins Ends blank) Chamber Pressure (ATA 1 2.5 2.5 2.5 2.5 2.5 - - 2.5 1 ) Clock Time (24 hr) 08:29 08:57 09:27 09:32 10:02 10:07 - - 10:37 10:55 Treatment Length: 146 (minutes) Treatment Segments: 5 Vital Signs Capillary Blood Glucose Reference Range: 80 - 120 mg / dl HBO Diabetic Blood Glucose Intervention Range: <131 mg/dl or >249 mg/dl Time Vitals Blood Respiratory Capillary Blood Glucose Pulse Action Type: Pulse: Temperature: Taken: Pressure: Rate: Glucose (mg/dl): Meter #: Oximetry (%) Taken: Pre 07:45 112/69 85 18 98 Post 10:59 135/85 65 18 97.9 Treatment Response Treatment Toleration: Well Treatment Completion Status: Treatment Completed without Adverse Event Treatment Notes Compression of hyperbaric chamber was done cautiously, making stops at intervals to allow patient to equalize pressure in his middle ear. I  certify that I directed the operation of said chamber for this treatment. MScammell Brendt Dible Notes The patient stated that he felt like he had a head cold. I asked Dr. Heber Montez[place] to see the patient, before treatment. Dr. Heber [place] asked that we dive slowly. Compression rate at 1.0. The patient was asked if his ears were clearing about every min. He stated that he was able, each time. When the patient reached 2.5 ATA, he was asked again. Information entered from paper intake sheet. After his treatment, I asked him how his ears were. He stated "good". The patient went to change, I spoke with Dr. Heber [place] and informed her that he was finished with his treatment. That he stated that he had no problems with his ears. Physician HBO Attestation: I certify that I supervised this HBO treatment in accordance with Medicare guidelines. A trained emergency response team is readily available per Yes hospital policies and procedures. Continue HBOT as ordered. Yes Electronic Signature(s) Signed: 06/20/2021 8:28:27 AM By: Donavan Burnet CHT EMT BS , , Signed: 06/20/2021 9:56:14 AM By: [name] DO Previous Signature: 06/15/2021 1:52:54 PM Version By: Donavan Burnet CHT EMT BS , , Previous Signature: 06/18/2021 10:06:40 AM Version By: [name] DO Previous Signature: 06/15/2021 12:18:20 PM Version By: [name] DO Previous Signature: 06/15/2021 12:05:08 PM Version By: Donavan Burnet CHT EMT BS , , Previous Signature: 06/15/2021 11:35:13 AM Version By: [name] EMT Previous Signature: 06/15/2021 11:31:06 AM Version By: [name] EMT Entered By: Donavan Burnet on 06/20/2021 41:28:78 -------------------------------------------------------------------------------- HBO Safety Checklist Details Patient Name: Date of Service: [name] [name] 06/15/2021 7:30 A M Medical Record Number: [id number] Patient Account Number: [id number] Date of Birth/Sex: Treating RN: 04-18-1951 (71 y.o. [name] Primary Care Lajuane Leatham: [name] Other Clinician: [name] Referring Alanah Sakuma: Treating Essie Gehret/Extender: Heber [place],  Andrew Au, Miguel Weeks in Treatment: 4 HBO Safety Checklist Items Safety Checklist Consent Form Signed Patient voided / foley secured and emptied When did you last eato 0600 Last dose of injectable or oral agent NA Ostomy pouch emptied and vented if applicable NA All implantable devices assessed, documented and approved NA Intravenous access site secured and place NA Valuables secured Linens and cotton and cotton/polyester blend (less than 51% polyester) Personal oil-based products / skin lotions / body lotions removed Wigs or hairpieces removed NA Smoking or tobacco materials removed Books / newspapers / magazines / loose paper removed Cologne, aftershave, perfume and deodorant removed Jewelry removed (may wrap wedding band) NA Make-up removed NA Hair care products removed NA Battery operated devices (external) removed Heating patches and chemical warmers removed Titanium eyewear removed NA Nail polish cured greater than 10 hours NA Casting material cured greater than 10 hours NA Hearing aids removed NA Loose dentures or partials removed NA Prosthetics have been removed NA Patient demonstrates correct use of air break device (if applicable) Patient concerns have been addressed Patient grounding bracelet on and cord attached to chamber Specifics for Inpatients (complete in addition to above) Medication sheet sent with patient NA Intravenous medications needed or due during therapy sent with patient NA Drainage tubes (e.g. nasogastric tube or chest tube secured and vented) NA Endotracheal or Tracheotomy tube secured NA Cuff deflated of air and inflated with saline NA Airway suctioned NA Notes Paper version used prior to treatment. I certify I directed the performance of the safety check for this  treatment. MScammell Electronic Signature(s) Signed: 06/15/2021 1:53:19 PM By: Donavan Burnet CHT EMT BS , , Previous Signature: 06/15/2021 12:03:02 PM Version By: Donavan Burnet CHT EMT BS , , Previous Signature: 06/15/2021 11:15:19 AM Version By: [name] EMT Entered By: Donavan Burnet on 06/15/2021 13:53:19

## 2021-06-15 NOTE — Progress Notes (Signed)
MARQUS, MACPHEE ([id number]) Visit Report for 06/15/2021 Physician Orders Details Patient Name: Date of Service: [name] [name] 06/15/2021 7:30 A M Medical Record Number: [id number] Patient Account Number: [id number] Date of Birth/Sex: Treating RN: 02/07/1951 (71 y.o. 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 05/15/2021 No Yes C61 Malignant neoplasm of prostate 05/15/2021 No Yes R30.0 Dysuria 05/15/2021 No Yes Z00.00 Encounter for general adult medical examination without abnormal findings 05/15/2021 No Yes Inactive Problems Resolved Problems Electronic Signature(s) Signed: 06/15/2021 11:31:58 AM By: [name] EMT Signed: 06/15/2021 12:18:20 PM By: [name] DO Entered By: [name] on  06/15/2021 11:31:57 -------------------------------------------------------------------------------- SuperBill Details Patient Name: Date of Service: [name] [name] 06/15/2021 Medical Record Number: [id number] Patient Account Number: [id number] Date of Birth/Sex: Treating RN: [birth year] (71 y.o. 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 C61 Malignant neoplasm of prostate R30.0 Dysuria Z00.00 Encounter for general adult medical examination  without abnormal findings Modifier: Quantity: 5 Physician Procedures : CPT4 Code Description Modifier [id number] - WC PHYS HYPERBARIC OXYGEN THERAPY ICD-10 Diagnosis Description 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

## 2021-06-18 ENCOUNTER — Encounter (HOSPITAL_BASED_OUTPATIENT_CLINIC_OR_DEPARTMENT_OTHER): Payer: Medicare Other | Admitting: Internal Medicine

## 2021-06-18 ENCOUNTER — Other Ambulatory Visit: Payer: Self-pay

## 2021-06-18 DIAGNOSIS — C61 Malignant neoplasm of prostate: Secondary | ICD-10-CM

## 2021-06-18 DIAGNOSIS — R300 Dysuria: Secondary | ICD-10-CM | POA: Diagnosis not present

## 2021-06-18 DIAGNOSIS — Z79899 Other long term (current) drug therapy: Secondary | ICD-10-CM | POA: Diagnosis not present

## 2021-06-18 DIAGNOSIS — N3040 Irradiation cystitis without hematuria: Secondary | ICD-10-CM | POA: Diagnosis not present

## 2021-06-18 NOTE — Progress Notes (Addendum)
EPIC, TRIBBETT ([id number]) Visit Report for 06/18/2021 HBO Details Patient Name: Date of Service: [name] [name] 06/18/2021 7:30 A M Medical Record Number: [id number] Patient Account Number: [id number] Date of Birth/Sex: Treating RN: 1951-04-01 (71 y.o. [name] Primary Care Nickalous Stingley: [name] Other Clinician: [name] Referring Nazanin Kinner: Treating Josalyn Dettmann/Extender: [name]: 4 HBO Treatment Course Details Treatment Course Number: 1 Ordering Taeko Schaffer: [name] T Treatments Ordered: otal 40 HBO Treatment Start Date: 05/29/2021 HBO Indication: Late Effect of Radiation HBO Treatment Details Treatment Number: 9 Patient Type: Outpatient Chamber Type: Monoplace Chamber Serial #: [id number] Treatment Protocol: 2.5 ATA with 90 minutes oxygen, with two 5 minute air breaks Treatment Details Compression Rate Down: 1.5 psi / minute De-Compression Rate Up: 1.5 psi / minute A breaks and breathing ir Compress Tx Pressure periods Decompress Decompress Begins Reached (leave unused spaces Begins Ends blank) Chamber Pressure (ATA 1 2.5 2.5 2.5 2.5 2.5 - - 2.5 1 ) Clock Time (24 hr) 08:32 08:53 09:23 09:28 09:58 10:03 - - 10:33 10:46 Treatment Length: 134 (minutes) Treatment Segments: 4 Vital Signs Capillary Blood Glucose Reference Range: 80 - 120 mg / dl HBO Diabetic Blood Glucose Intervention Range: <131 mg/dl or >249 mg/dl Time Vitals Blood Respiratory Capillary Blood Glucose Pulse Action Type: Pulse: Temperature: Taken: Pressure: Rate: Glucose (mg/dl): Meter #: Oximetry (%) Taken: Pre 07:54 106/57 65 18 97.9 Post 10:49 103/72 64 18 98 Treatment Response Treatment Toleration: Well Treatment Completion Status: Treatment Completed without Adverse Event Treatment Notes I certify that I directed the operation of said chamber for this treatment. MScammell Physician HBO Attestation: I certify that I supervised  this HBO treatment in accordance with Medicare guidelines. A trained emergency response team is readily available per Yes hospital policies and procedures. Continue HBOT as ordered. Yes Electronic Signature(s) Signed: 06/18/2021 1:27:31 PM By: [name] DO Previous Signature: 06/18/2021 11:38:54 AM Version By: Donavan Burnet CHT EMT BS , , Entered By: [name] on 06/18/2021 13:25:47 -------------------------------------------------------------------------------- HBO Safety Checklist Details Patient Name: Date of Service: [name] [name] 06/18/2021 7:30 A M Medical Record Number: [id number] Patient Account Number: [id number] Date of Birth/Sex: Treating RN: March 01, 1951 (71 y.o. [name] Primary Care Gedeon Brandow: [name] Other Clinician: Donavan Burnet Referring Emmarose Klinke: Treating Frances Joynt/Extender: [name]: 4 HBO Safety Checklist Items Safety Checklist Consent Form Signed Patient voided / foley secured and emptied When did you last eato 0600 Last dose of injectable or oral agent n/a Ostomy pouch emptied and vented if applicable NA All implantable devices assessed, documented and approved NA Intravenous access site secured and place NA Valuables secured Linens and cotton and cotton/polyester blend (less than 51% polyester) Personal oil-based products / skin lotions / body lotions removed Wigs or hairpieces removed NA Smoking or tobacco materials removed NA Books / newspapers / magazines / loose paper removed Cologne, aftershave, perfume and deodorant removed Jewelry removed (may wrap wedding band) Make-up removed NA Hair care products removed Battery operated devices (external) removed Heating patches and chemical warmers removed NA Titanium eyewear removed NA Nail polish cured greater than 10 hours NA Casting material cured greater than 10 hours NA Hearing aids removed NA Loose  dentures or partials removed NA Prosthetics have been removed NA Patient demonstrates correct use of air break device (if applicable) Patient concerns have been addressed Patient grounding bracelet on and cord attached to chamber Specifics for Inpatients (complete in addition to above) Medication sheet  sent with patient NA Intravenous medications needed or due during therapy sent with patient NA Drainage tubes (e.g. nasogastric tube or chest tube secured and vented) NA Endotracheal or Tracheotomy tube secured NA Cuff deflated of air and inflated with saline NA Airway suctioned NA Notes Paper version used prior to treatment. Electronic Signature(s) Signed: 06/18/2021 11:35:51 AM By: Donavan Burnet CHT EMT BS , , Entered By: Donavan Burnet on 06/18/2021 11:35:50

## 2021-06-18 NOTE — Progress Notes (Signed)
UNIQUE, SEARFOSS ([id number]) Visit Report for 06/18/2021 Arrival Information Details Patient Name: Date of Service: [name] 06/18/2021 7:30 A M Medical Record Number: [id number] Patient Account Number: [id number] Date of Birth/Sex: Treating RN: 1950-08-19 (71 y.o. [name] Primary Care Honor Fairbank: [name] Other Clinician: [name] Referring Walterine Amodei: Treating Zakhia Seres/Extender: [name] in Treatment: 4 Visit Information History Since Last Visit All ordered tests and consults were completed: Yes Patient Arrived: Ambulatory Added or deleted any medications: No Arrival Time: 07:32 Any new allergies or adverse reactions: No Accompanied By: self Had a fall or experienced change in No Transfer Assistance: None activities of daily living that may affect Patient Identification Verified: Yes risk of falls: Secondary Verification Process Completed: Yes Signs or symptoms of abuse/neglect since last visito No Patient Requires Transmission-Based Precautions: No Hospitalized since last visit: No Patient Has Alerts: No Implantable device outside of the clinic excluding No cellular tissue based products placed in the center since last visit: Pain Present Now: No Electronic Signature(s) Signed: 06/18/2021 11:33:10 AM By: Donavan Burnet CHT EMT BS , , Entered By: Donavan Burnet on 06/18/2021 11:33:10 -------------------------------------------------------------------------------- Encounter Discharge Information Details Patient Name: Date of Service: [name] 06/18/2021 7:30 A M Medical Record Number: [id number] Patient Account Number: [id number] Date of Birth/Sex: Treating RN: 1950/12/29 (71 y.o. [name] Primary Care Victorino Fatzinger: [name] Other Clinician: [name] Referring Saja Bartolini: Treating Kirti Carl/Extender: [name] in Treatment: 4 Encounter Discharge  Information Items Discharge Condition: Stable Ambulatory Status: Ambulatory Discharge Destination: Home Transportation: Private Auto Accompanied By: self Schedule Follow-up Appointment: No Clinical Summary of Care: Electronic Signature(s) Signed: 06/18/2021 11:45:42 AM By: Donavan Burnet CHT EMT BS , , Entered By: Donavan Burnet on 06/18/2021 11:45:41 -------------------------------------------------------------------------------- Vitals Details Patient Name: Date of Service: [name] 06/18/2021 7:30 A M Medical Record Number: [id number] Patient Account Number: [id number] Date of Birth/Sex: Treating RN: August 21, 1950 (71 y.o. [name] Primary Care Cynithia Hakimi: [name] Other Clinician: Donavan Burnet Referring Raegan Sipp: Treating Nilam Quakenbush/Extender: [name]: 4 Vital Signs Time Taken: 07:54 Temperature (F): 97.9 Height (in): 74 Pulse (bpm): 65 Weight (lbs): 315 Respiratory Rate (breaths/min): 18 Body Mass Index (BMI): 40.4 Blood Pressure (mmHg): 106/57 Reference Range: 80 - 120 mg / dl Electronic Signature(s) Signed: 06/18/2021 11:33:51 AM By: Donavan Burnet CHT EMT BS , , Entered By: Donavan Burnet on 06/18/2021 11:33:50

## 2021-06-18 NOTE — Progress Notes (Addendum)
[name] [name], [name] [name] ([id number]) Visit Report for 06/15/2021 Arrival Information Details Patient Name: Date of Service: [name] [name] 06/15/2021 7:30 A M Medical Record Number: [id number] Patient Account Number: [id number] Date of Birth/Sex: Treating RN: 04-01-1951 (71 y.o. [name] Primary Care Jelissa Espiritu: [name] Other Clinician: [name] Referring Britnee Mcdevitt: Treating Ellene Bloodsaw/Extender: [name] in Treatment: 4 Visit Information History Since Last Visit All ordered tests and consults were completed: Yes Patient Arrived: Ambulatory Added or deleted any medications: No Arrival Time: 07:37 Any new allergies or adverse reactions: No Accompanied By: None Had a fall or experienced change in No Transfer Assistance: None activities of daily living that may affect Patient Identification Verified: Yes risk of falls: Secondary Verification Process Completed: Yes Signs or symptoms of abuse/neglect since last visito No Patient Requires Transmission-Based Precautions: No Hospitalized since last visit: No Patient Has Alerts: No Implantable device outside of the clinic excluding No cellular tissue based products placed in the center since last visit: Pain Present Now: No Electronic Signature(s) Signed: 06/15/2021 11:12:59 AM By: [name] EMT Entered By: [name] on 06/15/2021 11:12:59 -------------------------------------------------------------------------------- Encounter Discharge Information Details Patient Name: Date of Service: [name] [name] 06/15/2021 7:30 A M Medical Record Number: [id number] Patient Account Number: [id number] Date of Birth/Sex: Treating RN: [birth year] (71 y.o. [name] Primary Care Colan Laymon: [name] Other Clinician: [name] Referring Kathleen Tamm: Treating Jorja Empie/Extender: [name] in Treatment: 4 Encounter Discharge Information  Items Discharge Condition: Stable Ambulatory Status: Ambulatory Discharge Destination: Home Transportation: Private Auto Accompanied By: None Schedule Follow-up Appointment: Yes Clinical Summary of Care: Electronic Signature(s) Signed: 06/15/2021 11:34:17 AM By: [name] EMT Entered By: [name] on 06/15/2021 11:34:17 -------------------------------------------------------------------------------- Patient/Caregiver Education Details Patient Name: Date of Service: [name] [name] 1/6/2023andnbsp7:30 [place] Number: [id number] Patient Account Number: [id number] Date of Birth/Gender: Treating RN: 06/27/1950 (71 y.o. [name] Primary Care Physician: [name] Other Clinician: [name] Referring Physician: Treating Physician/Extender: [name] in Treatment: 4 Education Assessment Education Provided To: Patient Education Topics Provided Electronic Signature(s) Signed: 06/15/2021 11:33:45 AM By: [name] EMT Entered By: [name] on 06/15/2021 11:33:44 -------------------------------------------------------------------------------- Vitals Details Patient Name: Date of Service: [name] [name] 06/15/2021 7:30 A M Medical Record Number: [id number] Patient Account Number: [id number] Date of Birth/Sex: Treating RN: 1951-05-25 (71 y.o. [name] Primary Care Jahni Paul: [name] Other Clinician: [name] Referring Ladashia Demarinis: Treating Branton Einstein/Extender: [name]: 4 Vital Signs Time Taken: 07:45 Temperature (F): 98.0 Height (in): 74 Pulse (bpm): 85 Weight (lbs): 315 Respiratory Rate (breaths/min): 18 Body Mass Index (BMI): 40.4 Blood Pressure (mmHg): 112/69 Reference Range: 80 - 120 mg / dl Electronic Signature(s) Signed: 06/15/2021 11:13:48 AM By: [name] EMT Entered By: [name] on 06/15/2021 11:13:48

## 2021-06-18 NOTE — Progress Notes (Signed)
[name], [name] ([id number]) Visit Report for 06/18/2021 SuperBill Details Patient Name: Date of Service: [name] [name] 06/18/2021 Medical Record Number: [id number] Patient Account Number: [id number] Date of Birth/Sex: Treating RN: 01/14/1951 (71 y.o. 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

## 2021-06-19 ENCOUNTER — Encounter (HOSPITAL_BASED_OUTPATIENT_CLINIC_OR_DEPARTMENT_OTHER): Payer: Medicare Other | Admitting: Internal Medicine

## 2021-06-19 DIAGNOSIS — R300 Dysuria: Secondary | ICD-10-CM | POA: Diagnosis not present

## 2021-06-19 DIAGNOSIS — Z79899 Other long term (current) drug therapy: Secondary | ICD-10-CM | POA: Diagnosis not present

## 2021-06-19 DIAGNOSIS — N3040 Irradiation cystitis without hematuria: Secondary | ICD-10-CM | POA: Diagnosis not present

## 2021-06-19 DIAGNOSIS — L598 Other specified disorders of the skin and subcutaneous tissue related to radiation: Secondary | ICD-10-CM | POA: Diagnosis not present

## 2021-06-19 NOTE — Progress Notes (Signed)
HERSHELL, BRANDL ([id number]) Visit Report for 06/19/2021 SuperBill Details Patient Name: Date of Service: [name] [name] 06/19/2021 Medical Record Number: [id number] Patient Account Number: [id number] Date of Birth/Sex: Treating RN: [birth year] (71 y.o. 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 C61 Malignant neoplasm of prostate R30.0 Dysuria Z00.00 Encounter for general adult medical examination without abnormal findings Physician Procedures Quantity CPT4 Code Description Modifier [id number] 48250 - WC PHYS HYPERBARIC OXYGEN THERAPY 1 ICD-10 Diagnosis Description 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

## 2021-06-19 NOTE — Progress Notes (Addendum)
GEROD, CALIGIURI ([id number]) Visit Report for 06/19/2021 HBO Details Patient Name: Date of Service: [name] [name] 06/19/2021 7:30 A M Medical Record Number: [id number] Patient Account Number: [id number] Date of Birth/Sex: Treating RN: 06/24/1950 (71 y.o. [name] Primary Care Bayani Renteria: [name] Other Clinician: [name] Referring Amado Andal: Treating Pradyun Ishman/Extender: [name] in Treatment: 5 HBO Treatment Course Details Treatment Course Number: 1 Ordering Korbyn Chopin: [name] T Treatments Ordered: otal 40 HBO Treatment Start Date: 05/29/2021 HBO Indication: Late Effect of Radiation HBO Treatment Details Treatment Number: 10 Patient Type: Outpatient Chamber Type: Monoplace Chamber Serial #: [id number] Treatment Protocol: 2.5 ATA with 90 minutes oxygen, with two 5 minute air breaks Treatment Details Compression Rate Down: 1.5 psi / minute De-Compression Rate Up: 2.0 psi / minute A breaks and breathing ir Compress Tx Pressure periods Decompress Decompress Begins Reached (leave unused spaces Begins Ends blank) Chamber Pressure (ATA 1 2.5 2.5 2.5 2.5 2.5 - - 2.5 1 ) Clock Time (24 hr) 07:53 08:13 08:44 08:49 09:19 09:24 - - 09:53 10:04 Treatment Length: 131 (minutes) Treatment Segments: 4 Vital Signs Capillary Blood Glucose Reference Range: 80 - 120 mg / dl HBO Diabetic Blood Glucose Intervention Range: <131 mg/dl or >249 mg/dl Time Vitals Blood Respiratory Capillary Blood Glucose Pulse Action Type: Pulse: Temperature: Taken: Pressure: Rate: Glucose (mg/dl): Meter #: Oximetry (%) Taken: Pre 07:49 122/76 75 18 98.2 Post 10:07 120/83 63 16 97.9 Treatment Response Treatment Toleration: Well Treatment Completion Status: Treatment Completed without Adverse Event Treatment Notes Paper version used prior to treatment. Compression rate started at 1.0 to 5psi, 1.5 to 18psi, 2.0 to 2.5ATA Aline Wesche Notes No concerns  with treatment given Physician HBO Attestation: I certify that I supervised this HBO treatment in accordance with Medicare guidelines. A trained emergency response team is readily available per Yes hospital policies and procedures. Continue HBOT as ordered. Yes Electronic Signature(s) Signed: 06/19/2021 4:28:08 PM By: Linton Ham MD Previous Signature: 06/19/2021 3:51:30 PM Version By: Donavan Burnet CHT EMT BS , , Previous Signature: 06/19/2021 3:25:51 PM Version By: Donavan Burnet CHT EMT BS , , Previous Signature: 06/19/2021 1:39:50 PM Version By: [name] EMT Entered By: Linton Ham on 06/19/2021 16:24:41 -------------------------------------------------------------------------------- HBO Safety Checklist Details Patient Name: Date of Service: [name] [name] 06/19/2021 7:30 A M Medical Record Number: [id number] Patient Account Number: [id number] Date of Birth/Sex: Treating RN: 03/12/1951 (71 y.o. [name] Primary Care Donley Harland: [name] Other Clinician: [name] Referring Arietta Eisenstein: Treating Matheo Rathbone/Extender: [name] in Treatment: 5 HBO Safety Checklist Items Safety Checklist Consent Form Signed Patient voided / foley secured and emptied When did you last eato 0600 Last dose of injectable or oral agent NA Ostomy pouch emptied and vented if applicable NA All implantable devices assessed, documented and approved NA Intravenous access site secured and place NA Valuables secured Linens and cotton and cotton/polyester blend (less than 51% polyester) Personal oil-based products / skin lotions / body lotions removed Wigs or hairpieces removed NA Smoking or tobacco materials removed Books / newspapers / magazines / loose paper removed Cologne, aftershave, perfume and deodorant removed Jewelry removed (may wrap wedding band) NA Make-up removed NA Hair care products removed NA Battery operated  devices (external) removed Heating patches and chemical warmers removed Titanium eyewear removed NA Nail polish cured greater than 10 hours NA Casting material cured greater than 10 hours NA Hearing aids removed NA Loose dentures or partials removed NA Prosthetics have been  removed NA Patient demonstrates correct use of air break device (if applicable) Patient concerns have been addressed Patient grounding bracelet on and cord attached to chamber Specifics for Inpatients (complete in addition to above) Medication sheet sent with patient NA Intravenous medications needed or due during therapy sent with patient NA Drainage tubes (e.g. nasogastric tube or chest tube secured and vented) NA Endotracheal or Tracheotomy tube secured NA Cuff deflated of air and inflated with saline NA Airway suctioned NA Notes Paper version used prior to treatment. I certify that I directed performance of safety check. MScammell Electronic Signature(s) Signed: 06/19/2021 3:25:34 PM By: Donavan Burnet CHT EMT BS , , Previous Signature: 06/19/2021 3:25:21 PM Version By: Donavan Burnet CHT EMT BS , , Previous Signature: 06/19/2021 1:36:03 PM Version By: [name] EMT Entered By: Donavan Burnet on 06/19/2021 15:25:34

## 2021-06-19 NOTE — Progress Notes (Addendum)
JALEAL, SCHLIEP ([id number]) Visit Report for 06/19/2021 Arrival Information Details Patient Name: Date of Service: [name] [name] 06/19/2021 7:30 A M Medical Record Number: [id number] Patient Account Number: [id number] Date of Birth/Sex: Treating RN: [birth year] (71 y.o. [name] [name] Primary Care Amor Packard: [name] Other Clinician: [name] Referring Janine Reller: Treating Darla Mcdonald/Extender: [name] in Treatment: 5 Visit Information History Since Last Visit All ordered tests and consults were completed: Yes Patient Arrived: Ambulatory Added or deleted any medications: No Arrival Time: 07:32 Any new allergies or adverse reactions: No Accompanied By: None Had a fall or experienced change in No Transfer Assistance: None activities of daily living that may affect Patient Identification Verified: Yes risk of falls: Secondary Verification Process Completed: Yes Signs or symptoms of abuse/neglect since last visito No Patient Requires Transmission-Based Precautions: No Hospitalized since last visit: No Patient Has Alerts: No Implantable device outside of the clinic excluding No cellular tissue based products placed in the center since last visit: Pain Present Now: No Electronic Signature(s) Signed: 06/19/2021 3:24:43 PM By: Donavan Burnet CHT EMT BS , , Previous Signature: 06/19/2021 1:33:44 PM Version By: [name] EMT Entered By: Donavan Burnet on 06/19/2021 15:24:43 -------------------------------------------------------------------------------- Encounter Discharge Information Details Patient Name: Date of Service: [name] [name] 06/19/2021 7:30 A M Medical Record Number: [id number] Patient Account Number: [id number] Date of Birth/Sex: Treating RN: December 19, 1950 (71 y.o. [name] [name] Primary Care [name] [name]: [name] Other Clinician: [name] Referring Kennadee Walthour: Treating Merilyn Pagan/Extender: [name] in Treatment: 5 Encounter Discharge Information Items Discharge Condition: Stable Ambulatory Status: Ambulatory Discharge Destination: Home Transportation: Private Auto Accompanied By: None Schedule Follow-up Appointment: Yes Clinical Summary of Care: Electronic Signature(s) Signed: 06/19/2021 3:27:24 PM By: Donavan Burnet CHT EMT BS , , Previous Signature: 06/19/2021 1:42:36 PM Version By: [name] EMT Entered By: Donavan Burnet on 06/19/2021 15:27:24 -------------------------------------------------------------------------------- Patient/Caregiver Education Details Patient Name: Date of Service: [name] [name] 1/10/2023andnbsp7:30 [name] [name] Number: [id number] Patient Account Number: [id number] Date of Birth/Gender: Treating RN: 06/15/1950 (71 y.o. [name] [name] Primary Care Physician: [name] Other Clinician: [name] Referring Physician: Treating Physician/Extender: [name] in Treatment: 5 Education Assessment Education Provided To: Patient Education Topics Provided Electronic Signature(s) Signed: 06/22/2021 8:34:43 AM By: Donavan Burnet CHT EMT BS , , Previous Signature: 06/19/2021 1:42:05 PM Version By: [name] EMT Entered By: Donavan Burnet on 06/19/2021 15:27:14 -------------------------------------------------------------------------------- Vitals Details Patient Name: Date of Service: [name] [name] 06/19/2021 7:30 A M Medical Record Number: [id number] Patient Account Number: [id number] Date of Birth/Sex: Treating RN: 09/07/1950 (71 y.o. [name] [name] Primary Care [name] [name]: [name] Other Clinician: [name] Referring [name] [name]: Treating Krystn Dermody/Extender: [name] in Treatment: 5 Vital Signs Time Taken: 07:49 Temperature (F): 98.2 Height (in): 74 Pulse (bpm): 75 Weight (lbs):  315 Respiratory Rate (breaths/min): 18 Body Mass Index (BMI): 40.4 Blood Pressure (mmHg): 122/76 Reference Range: 80 - 120 mg / dl Electronic Signature(s) Signed: 06/19/2021 3:24:52 PM By: Donavan Burnet CHT EMT BS , , Previous Signature: 06/19/2021 1:34:28 PM Version By: [name] EMT Entered By: Donavan Burnet on 06/19/2021 15:24:52

## 2021-06-20 ENCOUNTER — Other Ambulatory Visit: Payer: Self-pay

## 2021-06-20 ENCOUNTER — Encounter (HOSPITAL_BASED_OUTPATIENT_CLINIC_OR_DEPARTMENT_OTHER): Payer: Medicare Other | Admitting: Physician Assistant

## 2021-06-20 DIAGNOSIS — Z79899 Other long term (current) drug therapy: Secondary | ICD-10-CM | POA: Diagnosis not present

## 2021-06-20 DIAGNOSIS — L598 Other specified disorders of the skin and subcutaneous tissue related to radiation: Secondary | ICD-10-CM | POA: Diagnosis not present

## 2021-06-20 DIAGNOSIS — R300 Dysuria: Secondary | ICD-10-CM | POA: Diagnosis not present

## 2021-06-20 DIAGNOSIS — N3040 Irradiation cystitis without hematuria: Secondary | ICD-10-CM | POA: Diagnosis not present

## 2021-06-20 NOTE — Progress Notes (Addendum)
PRIYANSH, PRY ([id number]) Visit Report for 06/20/2021 Arrival Information Details Patient Name: Date of Service: [name] 06/20/2021 7:30 A M Medical Record Number: [id number] Patient Account Number: [id number] Date of Birth/Sex: Treating RN: 1950/08/01 (71 y.o. [name], [name] Primary Care Bruce Churilla: [name] Other Clinician: [name] Referring Evander Macaraeg: Treating Tank Difiore/Extender: [name] Weeks in Treatment: 5 Visit Information History Since Last Visit All ordered tests and consults were completed: Yes Patient Arrived: Ambulatory Added or deleted any medications: No Arrival Time: 07:33 Any new allergies or adverse reactions: No Accompanied By: None Had a fall or experienced change in No Transfer Assistance: None activities of daily living that may affect Patient Identification Verified: Yes risk of falls: Secondary Verification Process Completed: Yes Signs or symptoms of abuse/neglect since last visito No Patient Requires Transmission-Based Precautions: No Hospitalized since last visit: No Patient Has Alerts: No Implantable device outside of the clinic excluding No cellular tissue based products placed in the center since last visit: Pain Present Now: No Electronic Signature(s) Signed: 06/20/2021 12:59:30 PM By: [name] EMT Entered By: [name] on 06/20/2021 12:59:30 -------------------------------------------------------------------------------- Encounter Discharge Information Details Patient Name: Date of Service: [name] 06/20/2021 7:30 A M Medical Record Number: [id number] Patient Account Number: [id number] Date of Birth/Sex: Treating RN: Jul 19, 1950 (71 y.o. [name], [name] Primary Care Jairen Goldfarb: [name] Other Clinician: [name] Referring Therin Vetsch: Treating Ellwyn Ergle/Extender: Meridee Score in Treatment: 5 Encounter Discharge Information  Items Discharge Condition: Stable Ambulatory Status: Ambulatory Discharge Destination: Home Transportation: Private Auto Accompanied By: None Schedule Follow-up Appointment: Yes Clinical Summary of Care: Electronic Signature(s) Signed: 06/20/2021 1:10:30 PM By: [name] EMT Entered By: [name] on 06/20/2021 13:10:30 -------------------------------------------------------------------------------- Patient/Caregiver Education Details Patient Name: Date of Service: [name] 1/11/2023andnbsp7:30 Juliaetta Number: [id number] Patient Account Number: [id number] Date of Birth/Gender: Treating RN: December 03, 1950 (71 y.o. M) Primary Care Physician: [name] Other Clinician: Referring Physician: Treating Physician/Extender: Meridee Score in Treatment: 5 Education Assessment Education Provided To: Patient Education Topics Provided Electronic Signature(s) Signed: 06/20/2021 1:10:06 PM By: [name] EMT Entered By: [name] on 06/20/2021 13:10:05 -------------------------------------------------------------------------------- Vitals Details Patient Name: Date of Service: [name] 06/20/2021 7:30 A M Medical Record Number: [id number] Patient Account Number: [id number] Date of Birth/Sex: Treating RN: 1951/04/18 (71 y.o. [name], [name] Primary Care Cora Brierley: [name] Other Clinician: [name] Referring Allison Silva: Treating Jazmeen Axtell/Extender: [name] Weeks in Treatment: 5 Vital Signs Time Taken: 08:04 Temperature (F): 97.6 Height (in): 74 Pulse (bpm): 75 Weight (lbs): 315 Respiratory Rate (breaths/min): 16 Body Mass Index (BMI): 40.4 Blood Pressure (mmHg): 113/69 Reference Range: 80 - 120 mg / dl Electronic Signature(s) Signed: 06/20/2021 1:00:12 PM By: [name] EMT Entered By: [name] on 06/20/2021 13:00:12

## 2021-06-20 NOTE — Progress Notes (Signed)
AZAI, GAFFIN ([id number]) Visit Report for 06/20/2021 Problem List Details Patient Name: Date of Service: [name] [name] 06/20/2021 7:30 A M Medical Record Number: [id number] Patient Account Number: [id number] Date of Birth/Sex: Treating RN: 1950/09/26 (71 y.o. 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 05/15/2021 No Yes C61 Malignant neoplasm of prostate 05/15/2021 No Yes R30.0 Dysuria 05/15/2021 No Yes Z00.00 Encounter for general adult medical examination without abnormal findings 05/15/2021 No Yes Inactive Problems Resolved Problems Electronic Signature(s) Signed: 06/20/2021 1:07:31 PM By: [name] EMT Signed: 06/20/2021 5:05:22 PM By: Worthy Keeler PA-C Entered By: [name] on 06/20/2021 13:07:31 -------------------------------------------------------------------------------- SuperBill Details Patient Name: Date of Service: [name] [name] 06/20/2021 Medical Record Number: [id number] Patient Account Number: [id number] Date of Birth/Sex: Treating RN: Feb 26, 1951 (71 y.o. 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

## 2021-06-20 NOTE — Progress Notes (Addendum)
AMAAN, MEYER ([id number]) Visit Report for 06/20/2021 HBO Details Patient Name: Date of Service: [name] [name] 06/20/2021 7:30 A M Medical Record Number: [id number] Patient Account Number: [id number] Date of Birth/Sex: Treating RN: 1951/01/16 (71 y.o. [name], [name] Primary Care Cristy Colmenares: [name] Other Clinician: [name] Referring [place] Paone: Treating Ottie Neglia/Extender: [name] Weeks in Treatment: 5 HBO Treatment Course Details Treatment Course Number: 1 Ordering Camillo Quadros: [name] T Treatments Ordered: otal 40 HBO Treatment Start Date: 05/29/2021 HBO Indication: Late Effect of Radiation HBO Treatment Details Treatment Number: 11 Patient Type: Outpatient Chamber Type: Monoplace Chamber Serial #: [id number] Treatment Protocol: 2.5 ATA with 90 minutes oxygen, with two 5 minute air breaks Treatment Details Compression Rate Down: 1.5 psi / minute De-Compression Rate Up: 2.0 psi / minute A breaks and breathing ir Compress Tx Pressure periods Decompress Decompress Begins Reached (leave unused spaces Begins Ends blank) Chamber Pressure (ATA 1 2.5 2.5 2.5 2.5 2.5 - - 2.5 1 ) Clock Time (24 hr) 08:06 08:21 08:51 08:56 09:27 09:32 - - 10:02 10:11 Treatment Length: 125 (minutes) Treatment Segments: 4 Vital Signs Capillary Blood Glucose Reference Range: 80 - 120 mg / dl HBO Diabetic Blood Glucose Intervention Range: <131 mg/dl or >249 mg/dl Time Vitals Blood Respiratory Capillary Blood Glucose Pulse Action Type: Pulse: Temperature: Taken: Pressure: Rate: Glucose (mg/dl): Meter #: Oximetry (%) Taken: Pre 08:04 113/69 75 16 97.6 Post 10:15 128/79 62 16 98.7 Treatment Response Treatment Toleration: Well Treatment Completion Status: Treatment Completed without Adverse Event Treatment Notes Paper version used prior to treatment. I certify that I directed operation of said chamber for today's treatment. M[place]ell Electronic  Signature(s) Signed: 06/20/2021 2:34:58 PM By: Donavan Burnet CHT EMT BS , , Signed: 06/20/2021 5:05:22 PM By: Worthy Keeler PA-C Previous Signature: 06/20/2021 1:05:42 PM Version By: [name] EMT Entered By: Donavan Burnet on 06/20/2021 14:34:57 -------------------------------------------------------------------------------- HBO Safety Checklist Details Patient Name: Date of Service: [name] [name] 06/20/2021 7:30 A M Medical Record Number: [id number] Patient Account Number: [id number] Date of Birth/Sex: Treating RN: [birth year] (71 y.o. [name], [name] Primary Care Lashawnna Lambrecht: [name] Other Clinician: [name] Referring Mattisen Pohlmann: Treating Shavon Zenz/Extender: [name] Weeks in Treatment: 5 HBO Safety Checklist Items Safety Checklist Consent Form Signed Patient voided / foley secured and emptied When did you last eato 0615 Last dose of injectable or oral agent NA Ostomy pouch emptied and vented if applicable NA All implantable devices assessed, documented and approved NA Intravenous access site secured and place NA Valuables secured Linens and cotton and cotton/polyester blend (less than 51% polyester) Personal oil-based products / skin lotions / body lotions removed Wigs or hairpieces removed NA Smoking or tobacco materials removed Books / newspapers / magazines / loose paper removed Cologne, aftershave, perfume and deodorant removed Jewelry removed (may wrap wedding band) Make-up removed NA Hair care products removed Battery operated devices (external) removed Heating patches and chemical warmers removed Titanium eyewear removed NA Nail polish cured greater than 10 hours NA Casting material cured greater than 10 hours NA Hearing aids removed NA Loose dentures or partials removed NA Prosthetics have been removed NA Patient demonstrates correct use of air break device (if applicable) Patient concerns have  been addressed Patient grounding bracelet on and cord attached to chamber Specifics for Inpatients (complete in addition to above) Medication sheet sent with patient NA Intravenous medications needed or due during therapy sent with patient NA Drainage tubes (e.g. nasogastric tube or  chest tube secured and vented) NA Endotracheal or Tracheotomy tube secured NA Cuff deflated of air and inflated with saline NA Airway suctioned NA Notes Paper version used prior to treatment. I certify that I directed the safety check for this treatment. MScammell Electronic Signature(s) Signed: 06/20/2021 2:34:18 PM By: Donavan Burnet CHT EMT BS , , Previous Signature: 06/20/2021 1:01:31 PM Version By: [name] EMT Entered By: Donavan Burnet on 06/20/2021 14:34:17

## 2021-06-21 ENCOUNTER — Encounter (HOSPITAL_BASED_OUTPATIENT_CLINIC_OR_DEPARTMENT_OTHER): Payer: Medicare Other | Admitting: Internal Medicine

## 2021-06-21 DIAGNOSIS — N3040 Irradiation cystitis without hematuria: Secondary | ICD-10-CM | POA: Diagnosis not present

## 2021-06-21 DIAGNOSIS — Z79899 Other long term (current) drug therapy: Secondary | ICD-10-CM | POA: Diagnosis not present

## 2021-06-21 DIAGNOSIS — R300 Dysuria: Secondary | ICD-10-CM | POA: Diagnosis not present

## 2021-06-21 DIAGNOSIS — L598 Other specified disorders of the skin and subcutaneous tissue related to radiation: Secondary | ICD-10-CM | POA: Diagnosis not present

## 2021-06-21 NOTE — Progress Notes (Addendum)
[name] [name], [name] [name] ([id number]) Visit Report for 06/21/2021 Arrival Information Details Patient Name: Date of Service: [name] [name] 06/21/2021 7:30 A M Medical Record Number: [id number] Patient Account Number: [id number] Date of Birth/Sex: Treating RN: [birth year] (71 y.o. [name] Primary Care Tabbetha Kutscher: [name] Other Clinician: Donavan Burnet Referring Koichi Platte: Treating Carliss Quast/Extender: [name] in Treatment: 5 Visit Information History Since Last Visit All ordered tests and consults were completed: Yes Patient Arrived: Ambulatory Added or deleted any medications: No Arrival Time: 07:50 Any new allergies or adverse reactions: No Accompanied By: self Had a fall or experienced change in No Transfer Assistance: None activities of daily living that may affect Patient Identification Verified: Yes risk of falls: Secondary Verification Process Completed: Yes Signs or symptoms of abuse/neglect since last visito No Patient Requires Transmission-Based Precautions: No Hospitalized since last visit: No Patient Has Alerts: No Implantable device outside of the clinic excluding No cellular tissue based products placed in the center since last visit: Pain Present Now: No Electronic Signature(s) Signed: 06/22/2021 8:34:43 AM By: Donavan Burnet CHT EMT BS , , Previous Signature: 06/21/2021 8:28:57 AM Version By: Donavan Burnet CHT EMT BS , , Entered By: Donavan Burnet on 06/21/2021 11:13:54 -------------------------------------------------------------------------------- Encounter Discharge Information Details Patient Name: Date of Service: [name] [name] 06/21/2021 7:30 A M Medical Record Number: [id number] Patient Account Number: [id number] Date of Birth/Sex: Treating RN: [birth year] (71 y.o. [name] Primary Care Yarelin Reichardt: [name] Other Clinician: Donavan Burnet Referring Christene Pounds: Treating  Sonny Anthes/Extender: [name] in Treatment: 5 Encounter Discharge Information Items Discharge Condition: Stable Ambulatory Status: Ambulatory Discharge Destination: Home Transportation: Private Auto Accompanied By: self Schedule Follow-up Appointment: No Clinical Summary of Care: Electronic Signature(s) Signed: 06/22/2021 8:34:43 AM By: Donavan Burnet CHT EMT BS , , Previous Signature: 06/21/2021 11:13:01 AM Version By: Donavan Burnet CHT EMT BS , , Entered By: Donavan Burnet on 06/21/2021 11:15:40 -------------------------------------------------------------------------------- Vitals Details Patient Name: Date of Service: [name] [name] 06/21/2021 7:30 A M Medical Record Number: [id number] Patient Account Number: [id number] Date of Birth/Sex: Treating RN: Apr 15, 1951 (71 y.o. [name] Primary Care Kathleene Bergemann: [name] Other Clinician: Donavan Burnet Referring Emmamarie Kluender: Treating Maylin Freeburg/Extender: [name] in Treatment: 5 Vital Signs Time Taken: 07:50 Temperature (F): 97.8 Height (in): 74 Pulse (bpm): 85 Weight (lbs): 315 Respiratory Rate (breaths/min): 18 Body Mass Index (BMI): 40.4 Blood Pressure (mmHg): 129/72 Reference Range: 80 - 120 mg / dl Electronic Signature(s) Signed: 06/22/2021 8:34:43 AM By: Donavan Burnet CHT EMT BS , , Previous Signature: 06/21/2021 8:29:33 AM Version By: Donavan Burnet CHT EMT BS , , Entered By: Donavan Burnet on 06/21/2021 11:14:23

## 2021-06-21 NOTE — Progress Notes (Addendum)
XAVIOUS, SHARRAR ([id number]) Visit Report for 06/21/2021 HBO Details Patient Name: Date of Service: [name] [name] 06/21/2021 7:30 A M Medical Record Number: [id number] Patient Account Number: [id number] Date of Birth/Sex: Treating RN: 07-May-1951 (71 y.o. [name] Primary Care Vaishali Baise: [name] Other Clinician: Donavan Burnet Referring Shakesha Soltau: Treating Demarrion Meiklejohn/Extender: [name] in Treatment: 5 HBO Treatment Course Details Treatment Course Number: 1 Ordering Amare Kontos: [name] T Treatments Ordered: otal 40 HBO Treatment Start Date: 05/29/2021 HBO Indication: Late Effect of Radiation HBO Treatment Details Treatment Number: 12 Patient Type: Outpatient Chamber Type: Monoplace Chamber Serial #: [id number] Treatment Protocol: 2.5 ATA with 90 minutes oxygen, with two 5 minute air breaks Treatment Details Compression Rate Down: 2.0 psi / minute De-Compression Rate Up: 2.0 psi / minute A breaks and breathing ir Compress Tx Pressure periods Decompress Decompress Begins Reached (leave unused spaces Begins Ends blank) Chamber Pressure (ATA 1 2.5 2.5 2.5 2.5 2.5 - - 2.5 1 ) Clock Time (24 hr) 07:56 08:08 08:38 08:43 09:13 09:18 - - 09:48 09:59 Treatment Length: 123 (minutes) Treatment Segments: 4 Vital Signs Capillary Blood Glucose Reference Range: 80 - 120 mg / dl HBO Diabetic Blood Glucose Intervention Range: <131 mg/dl or >249 mg/dl Time Vitals Blood Respiratory Capillary Blood Glucose Pulse Action Type: Pulse: Temperature: Taken: Pressure: Rate: Glucose (mg/dl): Meter #: Oximetry (%) Taken: Pre 07:50 129/72 85 18 97.8 Post 10:02 142/79 67 18 98.3 Treatment Response Treatment Toleration: Well Treatment Completion Status: Treatment Completed without Adverse Event Dalyla Chui Notes No concerns with treatment given Physician HBO Attestation: I certify that I supervised this HBO treatment in accordance with  Medicare guidelines. A trained emergency response team is readily available per Yes hospital policies and procedures. Continue HBOT as ordered. Yes Electronic Signature(s) Signed: 06/21/2021 4:29:39 PM By: Linton Ham MD Previous Signature: 06/21/2021 11:11:40 AM Version By: Donavan Burnet CHT EMT BS , , Entered By: Linton Ham on 06/21/2021 16:23:47 -------------------------------------------------------------------------------- HBO Safety Checklist Details Patient Name: Date of Service: [name] [name] 06/21/2021 7:30 A M Medical Record Number: [id number] Patient Account Number: [id number] Date of Birth/Sex: Treating RN: 07-05-1950 (71 y.o. [name] Primary Care Malavika Lira: [name] Other Clinician: Donavan Burnet Referring Jizel Cheeks: Treating Herve Haug/Extender: [name] in Treatment: 5 HBO Safety Checklist Items Safety Checklist Consent Form Signed Patient voided / foley secured and emptied When did you last eato 0600 Last dose of injectable or oral agent n/a Ostomy pouch emptied and vented if applicable NA All implantable devices assessed, documented and approved NA Intravenous access site secured and place NA Valuables secured Linens and cotton and cotton/polyester blend (less than 51% polyester) Personal oil-based products / skin lotions / body lotions removed Wigs or hairpieces removed NA Smoking or tobacco materials removed NA Books / newspapers / magazines / loose paper removed Cologne, aftershave, perfume and deodorant removed Jewelry removed (may wrap wedding band) Make-up removed NA Hair care products removed Battery operated devices (external) removed Heating patches and chemical warmers removed NA Titanium eyewear removed NA Nail polish cured greater than 10 hours clearcoat manicure Casting material cured greater than 10 hours NA Hearing aids removed NA Loose dentures or partials  removed NA Prosthetics have been removed NA Patient demonstrates correct use of air break device (if applicable) Patient concerns have been addressed Patient grounding bracelet on and cord attached to chamber Specifics for Inpatients (complete in addition to above) Medication sheet sent with patient NA Intravenous medications needed or  due during therapy sent with patient NA Drainage tubes (e.g. nasogastric tube or chest tube secured and vented) NA Endotracheal or Tracheotomy tube secured NA Cuff deflated of air and inflated with saline NA Airway suctioned NA Notes Paper version used prior to treatment. Electronic Signature(s) Signed: 06/22/2021 8:34:43 AM By: Donavan Burnet CHT EMT BS , , Previous Signature: 06/21/2021 8:31:29 AM Version By: Donavan Burnet CHT EMT BS , , Entered By: Donavan Burnet on 06/21/2021 11:14:32

## 2021-06-22 ENCOUNTER — Other Ambulatory Visit: Payer: Self-pay

## 2021-06-22 ENCOUNTER — Encounter (HOSPITAL_BASED_OUTPATIENT_CLINIC_OR_DEPARTMENT_OTHER): Payer: Medicare Other | Admitting: Internal Medicine

## 2021-06-22 DIAGNOSIS — R300 Dysuria: Secondary | ICD-10-CM

## 2021-06-22 DIAGNOSIS — C61 Malignant neoplasm of prostate: Secondary | ICD-10-CM | POA: Diagnosis not present

## 2021-06-22 DIAGNOSIS — Z79899 Other long term (current) drug therapy: Secondary | ICD-10-CM | POA: Diagnosis not present

## 2021-06-22 DIAGNOSIS — N3040 Irradiation cystitis without hematuria: Secondary | ICD-10-CM | POA: Diagnosis not present

## 2021-06-22 NOTE — Progress Notes (Addendum)
[name], [name] ([id number]) Visit Report for 06/22/2021 HBO Details Patient Name: Date of Service: [name] [name] 06/22/2021 7:30 A M Medical Record Number: [id number] Patient Account Number: [id number] Date of Birth/Sex: Treating RN: 07-23-1950 (71 y.o. [name] [name] Primary Care [name] [name]: [name] [name] Other Clinician: Valeria [name] Referring [name] [name]: Treating [name] [name]/Extender: [name] [name] Weeks in Treatment: 5 HBO Treatment Course Details Treatment Course Number: 1 Ordering [name] [name]: [name] [name] T Treatments Ordered: otal 40 HBO Treatment Start Date: 05/29/2021 HBO Indication: Late Effect of Radiation HBO Treatment Details Treatment Number: 13 Patient Type: Outpatient Chamber Type: Monoplace Chamber Serial #: [id number] Treatment Protocol: 2.5 ATA with 90 minutes oxygen, with two 5 minute air breaks Treatment Details Compression Rate Down: 2.0 psi / minute De-Compression Rate Up: 2.0 psi / minute A breaks and breathing ir Compress Tx Pressure periods Decompress Decompress Begins Reached (leave unused spaces Begins Ends blank) Chamber Pressure (ATA 1 2.5 2.5 2.5 2.5 2.5 - - 2.5 1 ) Clock Time (24 hr) 08:17 08:30 09:00 09:05 09:35 09:40 - - 10:10 10:21 Treatment Length: 124 (minutes) Treatment Segments: 4 Vital Signs Capillary Blood Glucose Reference Range: 80 - 120 mg / dl HBO Diabetic Blood Glucose Intervention Range: <131 mg/dl or >249 mg/dl Time Vitals Blood Respiratory Capillary Blood Glucose Pulse Action Type: Pulse: Temperature: Taken: Pressure: Rate: Glucose (mg/dl): Meter #: Oximetry (%) Taken: Pre 07:57 110/67 68 16 97.6 Post 10:28 140/91 64 16 97.8 Treatment Response Treatment Toleration: Well Treatment Completion Status: Treatment Completed without Adverse Event Physician HBO Attestation: I certify that I supervised this HBO treatment in accordance with Medicare guidelines. A trained emergency response team is  readily available per Yes hospital policies and procedures. Continue HBOT as ordered. Yes Electronic Signature(s) Signed: 06/22/2021 12:30:10 PM By: [name] Shan DO Previous Signature: 06/22/2021 10:55:43 AM Version By: [name] [name] EMT Entered By: [name] [name] on 06/22/2021 12:28:39 -------------------------------------------------------------------------------- HBO Safety Checklist Details Patient Name: Date of Service: [name] [name] 06/22/2021 7:30 A M Medical Record Number: [id number] Patient Account Number: [id number] Date of Birth/Sex: Treating RN: [birth year] (71 y.o. [name] [name], Meta.Reding Primary Care [name] [name]: [name] [name] Other Clinician: Valeria [name] Referring [name] [name]: Treating [name] [name]/Extender: [name] [name] Weeks in Treatment: 5 HBO Safety Checklist Items Safety Checklist Consent Form Signed Patient voided / foley secured and emptied When did you last eato 0600 Last dose of injectable or oral agent NA Ostomy pouch emptied and vented if applicable NA All implantable devices assessed, documented and approved NA Intravenous access site secured and place NA Valuables secured Linens and cotton and cotton/polyester blend (less than 51% polyester) Personal oil-based products / skin lotions / body lotions removed Wigs or hairpieces removed NA Smoking or tobacco materials removed Books / newspapers / magazines / loose paper removed Cologne, aftershave, perfume and deodorant removed Jewelry removed (may wrap wedding band) Make-up removed NA Hair care products removed NA Battery operated devices (external) removed Heating patches and chemical warmers removed Titanium eyewear removed NA Nail polish cured greater than 10 hours Casting material cured greater than 10 hours NA Hearing aids removed NA Loose dentures or partials removed NA Prosthetics have been removed NA Patient demonstrates correct use of air break device (if  applicable) Patient concerns have been addressed Patient grounding bracelet on and cord attached to chamber Specifics for Inpatients (complete in addition to above) Medication sheet sent with patient NA Intravenous medications needed or due during therapy sent with patient NA Drainage tubes (e.g. nasogastric tube or chest  tube secured and vented) NA Endotracheal or Tracheotomy tube secured NA [name] deflated of air and inflated with saline NA Airway suctioned NA Notes Information entered from paper intake sheet. Electronic Signature(s) Signed: 06/22/2021 9:18:49 AM By: [name] [name] EMT Entered By: [name] [name] on 06/22/2021 09:18:49

## 2021-06-22 NOTE — Progress Notes (Signed)
[name], [name] ([id number]) Visit Report for 06/22/2021 Problem List Details Patient Name: Date of Service: [name] [name] 06/22/2021 7:30 A M Medical Record Number: [id number] Patient Account Number: [id number] Date of Birth/Sex: Treating RN: 08-10-1950 (71 y.o. 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 05/15/2021 No Yes C61 Malignant neoplasm of prostate 05/15/2021 No Yes R30.0 Dysuria 05/15/2021 No Yes Z00.00 Encounter for general adult medical examination without abnormal findings 05/15/2021 No Yes Inactive Problems Resolved Problems Electronic Signature(s) Signed: 06/22/2021 10:56:45 AM By: [name] EMT Signed: 06/22/2021 12:30:10 PM By: [name] DO Entered By: [name] on 06/22/2021 10:56:44 -------------------------------------------------------------------------------- Browns Details Patient Name: Date of Service: [name] [name] 06/22/2021 Medical Record Number: [id number] Patient Account Number: [id number] Date of Birth/Sex: Treating RN: [birth year] (71 y.o. 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 C61 Malignant neoplasm  of prostate R30.0 Dysuria Z00.00 Encounter for general adult medical examination  without abnormal findings Modifier: Quantity: 4 Physician Procedures : CPT4 Code Description Modifier [id number] 09735 - WC PHYS HYPERBARIC OXYGEN THERAPY ICD-10 Diagnosis Description 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

## 2021-06-22 NOTE — Progress Notes (Addendum)
NAVDEEP, HALT ([id number]) Visit Report for 06/22/2021 Arrival Information Details Patient Name: Date of Service: [name] [name] 06/22/2021 7:30 A M Medical Record Number: [id number] Patient Account Number: [id number] Date of Birth/Sex: Treating RN: 11/25/1950 (71 y.o.) [name] [name], Meta.Reding Primary Care Linn Clavin: [name] Other Clinician: [name] Referring Johnnell Liou: Treating Ladaisha Portillo/Extender: [name]: 5 Visit Information History Since Last Visit All ordered tests and consults were completed: Yes Patient Arrived: Ambulatory Added or deleted any medications: No Arrival Time: 07:35 Any new allergies or adverse reactions: No Accompanied By: None Had a fall or experienced change in No Transfer Assistance: None activities of daily living that may affect Patient Identification Verified: Yes risk of falls: Secondary Verification Process Completed: Yes Signs or symptoms of abuse/neglect since last visito No Patient Requires Transmission-Based Precautions: No Hospitalized since last visit: No Patient Has Alerts: No Implantable device outside of the clinic excluding No cellular tissue based products placed in the center since last visit: Pain Present Now: No Electronic Signature(s) Signed: 06/22/2021 9:16:12 AM By: [name] EMT Entered By: [name] on 06/22/2021 09:16:12 -------------------------------------------------------------------------------- Vitals Details Patient Name: Date of Service: [name] [name] 06/22/2021 7:30 A M Medical Record Number: [id number] Patient Account Number: [id number] Date of Birth/Sex: Treating RN: Oct 20, 1950 (71 y.o. [name] [name] Primary Care Selestino Nila: [name] Other Clinician: [name] Referring Kyaira Trantham: Treating Cherith Tewell/Extender: [name]: 5 Vital Signs Time Taken: 07:57 Temperature (F): 97.6 Height (in):  74 Pulse (bpm): 68 Weight (lbs): 315 Respiratory Rate (breaths/min): 16 Body Mass Index (BMI): 40.4 Blood Pressure (mmHg): 110/67 Reference Range: 80 - 120 mg / dl Electronic Signature(s) Signed: 06/22/2021 9:16:59 AM By: [name] EMT Entered By: [name] on 06/22/2021 09:16:58

## 2021-06-22 NOTE — Progress Notes (Signed)
DENNISON, MCDAID ([id number]) Visit Report for 06/21/2021 SuperBill Details Patient Name: Date of Service: [name] [name] 06/21/2021 Medical Record Number: [id number] Patient Account Number: [id number] Date of Birth/Sex: Treating RN: 1950-12-27 (71 y.o. 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

## 2021-06-25 ENCOUNTER — Other Ambulatory Visit: Payer: Self-pay

## 2021-06-25 ENCOUNTER — Encounter (HOSPITAL_BASED_OUTPATIENT_CLINIC_OR_DEPARTMENT_OTHER): Payer: Medicare Other | Admitting: Internal Medicine

## 2021-06-25 DIAGNOSIS — N3040 Irradiation cystitis without hematuria: Secondary | ICD-10-CM | POA: Diagnosis not present

## 2021-06-25 DIAGNOSIS — Z79899 Other long term (current) drug therapy: Secondary | ICD-10-CM | POA: Diagnosis not present

## 2021-06-25 DIAGNOSIS — R300 Dysuria: Secondary | ICD-10-CM | POA: Diagnosis not present

## 2021-06-25 NOTE — Progress Notes (Addendum)
[name] [name], [name] [name] ([id number]) Visit Report for 06/25/2021 HBO Details Patient Name: Date of Service: [name] [name] 06/25/2021 7:30 A M Medical Record Number: [id number] Patient Account Number: [id number] Date of Birth/Sex: Treating RN: 1951-05-09 (71 y.o. [name] [name] Primary Care Jaeleah Smyser: [name] Other Clinician: Donavan Burnet Referring Violette Morneault: Treating Nyzier Boivin/Extender: [name] in Treatment: 5 HBO Treatment Course Details Treatment Course Number: 1 Ordering Mccall Lomax: [name] T Treatments Ordered: otal 40 HBO Treatment Start Date: 05/29/2021 HBO Indication: Late Effect of Radiation HBO Treatment Details Treatment Number: Treatment Aborted/Not Restarted: Adverse Event Patient Type: Outpatient Chamber Type: Monoplace Chamber Serial #: [id number] Treatment Protocol: 2.0 ATA with 90 minutes oxygen, with two 5 minute air breaks Treatment Details Compression Rate Down: 1.5 psi / minute De-Compression Rate Up: 1.5 psi / minute A breaks ir and breathing Compress Tx Pressure periods Decompress Decompress Begins Reached (leave Begins Ends unused spaces blank) Chamber Pressure (ATA 1 2 2222 -- 2 1 ) Clock Time (24 hr) 08:23 08:27 - - - - - - 08:30 08:34 Treatment Length: 11 (minutes) Treatment Segments: 0 Vital Signs Capillary Blood Glucose Reference Range: 80 - 120 mg / dl HBO Diabetic Blood Glucose Intervention Range: <131 mg/dl or >249 mg/dl Time Vitals Blood Respiratory Capillary Blood Glucose Pulse Action Type: Pulse: Temperature: Taken: Pressure: Rate: Glucose (mg/dl): Meter #: Oximetry (%) Taken: Pre 08:23 117/72 84 16 97.7 Treatment Response Treatment Toleration: Poor Adverse Events: 1:Barotrauma - Ear Treatment Completion Status: Treatment Aborted/Not Restarted Reason: Adverse Event Treatment Notes Patient reported that he was unable to equalize pressure in right middle ear at approximately 6.5  psi. Pressure was reduced. Patient was unable to equalize his right ear continually until reaching ambient pressure (1 ATA) and chamber was opened. Physician was alerted, examined the ear in question, and discussed with patient. Patient opted to try treatment again. He was unable to equalize pressure in his right middle ear at idle pressure of 1.5 psi for three tries. 0846 - 8185 patient retried without an additional dose of Afrin. Patient stated that he wanted to attempt the treatment. Patient was advised that if he could not equalize pressure in his right ear it would not be possible to travel to treatment depth. Patient attempted twice more ([phone] and [phone]) after physician approved an additional dose of Afrin. He was unsuccessful at ear equalization at idle pressure of 1.5 psi each time. Treatment was postponed until tomorrow. Patient was also educated on the proper use of Afrin to avoid rebound congestion while undergoing hyperbaric treatments. Physician HBO Attestation: I certify that I supervised this HBO treatment in accordance with Medicare guidelines. A trained emergency response team is readily available per Yes hospital policies and procedures. Continue HBOT as ordered. Yes Electronic Signature(s) Signed: 06/28/2021 1:32:22 PM By: Donavan Burnet CHT EMT BS , , Signed: 06/28/2021 2:15:24 PM By: [name] DO Previous Signature: 06/27/2021 11:21:11 AM Version By: Donavan Burnet CHT EMT BS , , Previous Signature: 06/28/2021 9:21:14 AM Version By: [name] DO Previous Signature: 06/26/2021 9:05:14 AM Version By: [name] DO Previous Signature: 06/25/2021 11:09:27 AM Version By: Donavan Burnet CHT EMT BS , , Entered By: Donavan Burnet on 06/28/2021 13:32:22 -------------------------------------------------------------------------------- HBO Safety Checklist Details Patient Name: Date of Service: [name] [name] 06/25/2021 7:30 A M Medical  Record Number: [id number] Patient Account Number: [id number] Date of Birth/Sex: Treating RN: [birth year] (71 y.o. [name] [name] Primary Care Davey Limas: [name] Other Clinician: [name] Referring Remedy Corporan:  Treating Nikhil Osei/Extender: [name]: 5 HBO Safety Checklist Items Safety Checklist Consent Form Signed Patient voided / foley secured and emptied When did you last eato 0600 Last dose of injectable or oral agent n/a Ostomy pouch emptied and vented if applicable NA All implantable devices assessed, documented and approved NA Intravenous access site secured and place NA Valuables secured Linens and cotton and cotton/polyester blend (less than 51% polyester) Personal oil-based products / skin lotions / body lotions removed Wigs or hairpieces removed NA Smoking or tobacco materials removed NA Books / newspapers / magazines / loose paper removed Cologne, aftershave, perfume and deodorant removed Jewelry removed (may wrap wedding band) Make-up removed NA Hair care products removed Battery operated devices (external) removed Heating patches and chemical warmers removed Titanium eyewear removed NA Nail polish cured greater than 10 hours Casting material cured greater than 10 hours NA Hearing aids removed NA Loose dentures or partials removed NA Prosthetics have been removed NA Patient demonstrates correct use of air break device (if applicable) Patient concerns have been addressed Patient grounding bracelet on and cord attached to chamber Specifics for Inpatients (complete in addition to above) Medication sheet sent with patient NA Intravenous medications needed or due during therapy sent with patient NA Drainage tubes (e.g. nasogastric tube or chest tube secured and vented) NA Endotracheal or Tracheotomy tube secured NA Cuff deflated of air and inflated with saline NA Airway suctioned NA Electronic  Signature(s) Signed: 06/25/2021 10:41:54 AM By: Donavan Burnet CHT EMT BS , , Entered By: Donavan Burnet on 06/25/2021 10:41:54

## 2021-06-25 NOTE — Progress Notes (Addendum)
FREDERICO, GERLING ([id number]) Visit Report for 06/25/2021 Arrival Information Details Patient Name: Date of Service: [name] 06/25/2021 7:30 A M Medical Record Number: [id number] Patient Account Number: [id number] Date of Birth/Sex: Treating RN: December 21, 1950 (71 y.o. [name] Primary Care Yurem Viner: [name] Other Clinician: Donavan Burnet Referring Maxamilian Amadon: Treating Jakyria Bleau/Extender: [name] in Treatment: 5 Visit Information History Since Last Visit All ordered tests and consults were completed: Yes Patient Arrived: Ambulatory Added or deleted any medications: No Arrival Time: 07:34 Any new allergies or adverse reactions: No Accompanied By: self Had a fall or experienced change in No Transfer Assistance: None activities of daily living that may affect Patient Identification Verified: Yes risk of falls: Secondary Verification Process Completed: Yes Signs or symptoms of abuse/neglect since last visito No Patient Requires Transmission-Based Precautions: No Hospitalized since last visit: No Patient Has Alerts: No Implantable device outside of the clinic excluding No cellular tissue based products placed in the center since last visit: Pain Present Now: No Electronic Signature(s) Signed: 06/25/2021 10:39:20 AM By: Donavan Burnet CHT EMT BS , , Entered By: Donavan Burnet on 06/25/2021 10:39:20 -------------------------------------------------------------------------------- Encounter Discharge Information Details Patient Name: Date of Service: [name] 06/25/2021 7:30 A M Medical Record Number: [id number] Patient Account Number: [id number] Date of Birth/Sex: Treating RN: 12/06/1950 (71 y.o. [name] Primary Care Janziel Hockett: [name] Other Clinician: Donavan Burnet Referring Jazzmyn Filion: Treating Lindsy Cerullo/Extender: [name] in Treatment: 5 Encounter Discharge  Information Items Discharge Condition: Stable Ambulatory Status: Ambulatory Discharge Destination: Home Transportation: Private Auto Accompanied By: self Schedule Follow-up Appointment: No Clinical Summary of Care: Electronic Signature(s) Signed: 06/25/2021 11:10:57 AM By: Donavan Burnet CHT EMT BS , , Entered By: Donavan Burnet on 06/25/2021 11:10:57 -------------------------------------------------------------------------------- Vitals Details Patient Name: Date of Service: [name] 06/25/2021 7:30 A M Medical Record Number: [id number] Patient Account Number: [id number] Date of Birth/Sex: Treating RN: 1950-11-26 (71 y.o. [name] Primary Care Ludy Messamore: [name] Other Clinician: Donavan Burnet Referring Ryleigh Buenger: Treating Pippa Hanif/Extender: [name]: 5 Vital Signs Time Taken: 08:23 Temperature (F): 97.7 Height (in): 74 Pulse (bpm): 84 Weight (lbs): 315 Respiratory Rate (breaths/min): 16 Body Mass Index (BMI): 40.4 Blood Pressure (mmHg): 117/72 Reference Range: 80 - 120 mg / dl Electronic Signature(s) Signed: 06/25/2021 10:39:56 AM By: Donavan Burnet CHT EMT BS , , Entered By: Donavan Burnet on 06/25/2021 10:39:55

## 2021-06-26 ENCOUNTER — Encounter (HOSPITAL_BASED_OUTPATIENT_CLINIC_OR_DEPARTMENT_OTHER): Payer: Medicare Other | Admitting: Internal Medicine

## 2021-06-26 DIAGNOSIS — L598 Other specified disorders of the skin and subcutaneous tissue related to radiation: Secondary | ICD-10-CM | POA: Diagnosis not present

## 2021-06-26 DIAGNOSIS — Z79899 Other long term (current) drug therapy: Secondary | ICD-10-CM | POA: Diagnosis not present

## 2021-06-26 DIAGNOSIS — R300 Dysuria: Secondary | ICD-10-CM | POA: Diagnosis not present

## 2021-06-26 DIAGNOSIS — N3040 Irradiation cystitis without hematuria: Secondary | ICD-10-CM | POA: Diagnosis not present

## 2021-06-26 NOTE — Progress Notes (Signed)
ALTIN, SEASE ([id number]) Visit Report for 06/26/2021 Physician Orders Details Patient Name: Date of Service: [name] [name] 06/26/2021 7:30 A M Medical Record Number: [id number] Patient Account Number: [id number] Date of Birth/Sex: Treating RN: [birth year] (71 y.o. M) Primary Care Provider: [name] Other Clinician: Referring Provider: Treating Provider/Extender: [name] in Treatment: 6 Verbal / Phone Orders: No Diagnosis Coding Hyperbaric Oxygen Therapy Indication: - Radiation Cystitis 2.0 ATA for 90 Minutes with 2 Five (5) Minute A Breaks - for only today, 06/26/2021, then continue treatments at 2.0 ATA for 90 minutes without air ir breaks on 06/27/2020. Total Number of Treatments: - 40 One treatments per day (delivered Monday through Friday unless otherwise specified in Special Instructions below): Afrin (Oxymetazoline HCL) 0.05% nasal spray - 1 spray in both nostrils daily as needed prior to HBO treatment for difficulty clearing ears Electronic Signature(s) Signed: 06/26/2021 3:10:36 PM By: Linton Ham MD Previous Signature: 06/26/2021 11:39:48 AM Version By: Donavan Burnet CHT EMT BS , , Previous Signature: 06/26/2021 10:57:16 AM Version By: Donavan Burnet CHT EMT BS , , Previous Signature: 06/26/2021 8:20:11 AM Version By: Donavan Burnet CHT EMT BS , , Entered By: Linton Ham on 06/26/2021 15:10:36 -------------------------------------------------------------------------------- Problem List Details Patient Name: Date of Service: [name] [name] D 06/26/2021 7:30 A M Medical Record Number: [id number] Patient Account Number: [id number] Date of Birth/Sex: Treating RN: [birth year] (71 y.o. 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 05/15/2021 No Yes C61 Malignant neoplasm of prostate 05/15/2021 No Yes R30.0 Dysuria 05/15/2021 No Yes Z00.00 Encounter for general adult medical examination without abnormal findings 05/15/2021 No Yes Inactive Problems Resolved Problems Electronic Signature(s) Signed: 06/26/2021 11:40:45 AM By: [name] EMT Signed: 06/26/2021 4:48:32 PM By: Linton Ham MD Entered By: [name] on 06/26/2021 11:40:45 -------------------------------------------------------------------------------- SuperBill Details Patient Name: Date of Service: [name] [name] 06/26/2021 Medical Record Number: [id number] Patient Account Number: [id number] Date of Birth/Sex: Treating RN: 12-Sep-1950 (71 y.o. 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 C61 Malignant neoplasm of prostate R30.0 Dysuria Z00.00 Encounter for general adult medical examination  without abnormal findings Modifier: Quantity: 5 Physician Procedures : CPT4 Code Description Modifier [id number] 38453 - WC PHYS HYPERBARIC OXYGEN THERAPY ICD-10 Diagnosis Description 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

## 2021-06-26 NOTE — Progress Notes (Signed)
ISTVAN, BEHAR ([id number]) Visit Report for 06/25/2021 SuperBill Details Patient Name: Date of Service: [name] 06/25/2021 Medical Record Number: [id number] Patient Account Number: [id number] Date of Birth/Sex: Treating RN: 04/18/1951 (71 y.o. 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

## 2021-06-26 NOTE — Progress Notes (Addendum)
[name] [name], [name] [name] ([id number]) Visit Report for 06/26/2021 HBO Details Patient Name: Date of Service: [name] [name] 06/26/2021 7:30 A M Medical Record Number: [id number] Patient Account Number: [id number] Date of Birth/Sex: Treating RN: 08/07/1950 (71 y.o. [name] [name] Primary Care [name] [name]: [name] [name] Other Clinician: Valeria [name] Referring [name] [name]: Treating [name] [name]/Extender: [name] [name] in Treatment: 6 HBO Treatment Course Details Treatment Course Number: 1 Ordering [name] [name]: [name] [name] T Treatments Ordered: otal 40 HBO Treatment Start Date: 05/29/2021 HBO Indication: Late Effect of Radiation HBO Treatment Details Treatment Number: 14 Patient Type: Outpatient Chamber Type: Monoplace Chamber Serial #: [id number] Treatment Protocol: 2.0 ATA with 90 minutes oxygen, with two 5 minute air breaks Treatment Details Compression Rate Down: 1.0 psi / minute De-Compression Rate Up: 1.0 psi / minute A breaks and breathing ir Compress Tx Pressure periods Decompress Decompress Begins Reached (leave unused spaces Begins Ends blank) Chamber Pressure (ATA 1 2 2 2 2 2  --2 1 ) Clock Time (24 hr) 08:19 08:39 09:09 09:15 09:45 09:50 - - 10:20 10:35 Treatment Length: 136 (minutes) Treatment Segments: 5 Vital Signs Capillary Blood Glucose Reference Range: 80 - 120 mg / dl HBO Diabetic Blood Glucose Intervention Range: <131 mg/dl or >249 mg/dl Time Vitals Blood Respiratory Capillary Blood Glucose Pulse Action Type: Pulse: Temperature: Taken: Pressure: Rate: Glucose (mg/dl): Meter #: Oximetry (%) Taken: Pre 08:15 110/72 70 18 98.1 Post 10:38 124/73 63 18 97.8 Treatment Response Treatment Toleration: Well Treatment Completion Status: Treatment Completed without Adverse Event Treatment Notes Checked on the patient every 1PSI/Min to 2 ATA on compression, to check that he was not having any problems clearing his ears. Checked on the patient  every 2 PSI/min to service on decompression, to check that he was not having any problems clearing his ears. Patient did not have any ear pain during treatment. Information entered from paper intake sheet. Dr. Dellia [name] saw patient after treatment. Rashied Corallo Notes The patient tolerated treatment well. He did complain of not being able to clear his right ear. We had previously sent him to see ENT Dr. Wilburn [name] on 12/21. He did not have at that time problems with his tympanic membranes and Dr. Wilburn [name] recommended a trial of Afrin which she is taking twice a day. He still cannot clear the right ear. Otoscopic exam showed some irritation of the right ear the left was normal. I suggested reducing his pressure from 2.5-2 when he seemed to tolerate this well. Tympanic membranes look fine after the dive Physician HBO Attestation: I certify that I supervised this HBO treatment in accordance with Medicare guidelines. A trained emergency response team is readily available per Yes hospital policies and procedures. Continue HBOT as ordered. Yes Electronic Signature(s) Signed: 06/26/2021 4:48:32 PM By: [name] Ham MD Previous Signature: 06/26/2021 12:18:42 PM Version By: [name] [name] EMT Previous Signature: 06/26/2021 11:39:50 AM Version By: [name] [name] EMT Previous Signature: 06/26/2021 11:32:55 AM Version By: [name] [name] EMT Entered By: [name] [name] on 06/26/2021 15:09:35 -------------------------------------------------------------------------------- HBO Safety Checklist Details Patient Name: Date of Service: [name] [name] 06/26/2021 7:30 A M Medical Record Number: [id number] Patient Account Number: [id number] Date of Birth/Sex: Treating RN: 1951/03/25 (71 y.o. [name] [name] Primary Care Vivianna [name]: [name] [name] Other Clinician: Valeria [name] Referring [name] [name]: Treating [name] [name]/Extender: [name] [name] in Treatment: 6 HBO Safety Checklist  Items Safety Checklist Consent Form Signed Patient voided / foley secured and emptied When did you last eato 0600 Last dose of injectable or oral agent  n/a Ostomy pouch emptied and vented if applicable NA All implantable devices assessed, documented and approved NA Intravenous access site secured and place NA Valuables secured Linens and cotton and cotton/polyester blend (less than 51% polyester) Personal oil-based products / skin lotions / body lotions removed Wigs or hairpieces removed NA Smoking or tobacco materials removed Books / newspapers / magazines / loose paper removed Cologne, aftershave, perfume and deodorant removed Jewelry removed (may wrap wedding band) Make-up removed Hair care products removed Battery operated devices (external) removed Heating patches and chemical warmers removed Titanium eyewear removed NA Nail polish cured greater than 10 hours Casting material cured greater than 10 hours NA Hearing aids removed NA Loose dentures or partials removed NA Prosthetics have been removed NA Patient demonstrates correct use of air break device (if applicable) Patient concerns have been addressed Patient seen by Dr. Dellia [name] Patient grounding bracelet on and cord attached to chamber Specifics for Inpatients (complete in addition to above) Medication sheet sent with patient NA Intravenous medications needed or due during therapy sent with patient NA Drainage tubes (e.g. nasogastric tube or chest tube secured and vented) NA Endotracheal or Tracheotomy tube secured NA Cuff deflated of air and inflated with saline NA Airway suctioned NA Notes Dr. Dellia [name] saw patient ref right ear problem from yesterday. Dr. Dellia [name] changed order for treatment. Electronic Signature(s) Signed: 06/26/2021 11:29:08 AM By: [name] [name] EMT Entered By: [name] [name] on 06/26/2021 11:29:08

## 2021-06-26 NOTE — Progress Notes (Addendum)
JANDEL, PATRIARCA ([id number]) Visit Report for 06/26/2021 Arrival Information Details Patient Name: Date of Service: [name] [name] 06/26/2021 7:30 A M Medical Record Number: [id number] Patient Account Number: [id number] Date of Birth/Sex: Treating RN: 06-13-1950 (71 y.o. [name] [name] Primary Care Jade Burright: [name] Other Clinician: [name] Referring Donette Mainwaring: Treating [place]/Extender: [name] in Treatment: 6 Visit Information History Since Last Visit All ordered tests and consults were completed: Yes Patient Arrived: Ambulatory Added or deleted any medications: No Arrival Time: 07:34 Any new allergies or adverse reactions: No Accompanied By: self Had a fall or experienced change in No Transfer Assistance: None activities of daily living that may affect Patient Identification Verified: Yes risk of falls: Secondary Verification Process Completed: Yes Signs or symptoms of abuse/neglect since last visito No Patient Requires Transmission-Based Precautions: No Hospitalized since last visit: No Patient Has Alerts: No Implantable device outside of the clinic excluding No cellular tissue based products placed in the center since last visit: Pain Present Now: No Electronic Signature(s) Signed: 06/26/2021 11:24:57 AM By: [name] EMT Entered By: [name] on 06/26/2021 11:24:57 -------------------------------------------------------------------------------- Encounter Discharge Information Details Patient Name: Date of Service: [name] [name] 06/26/2021 7:30 A M Medical Record Number: [id number] Patient Account Number: [id number] Date of Birth/Sex: Treating RN: [birth year] (71 y.o. [name] [name] Primary Care Corean Yoshimura: [name] Other Clinician: [name] Referring Kaemon Barnett: Treating Shaeley Segall/Extender: [name] in Treatment: 6 Encounter Discharge Information  Items Discharge Condition: Stable Ambulatory Status: Ambulatory Discharge Destination: Home Transportation: Private Auto Accompanied By: self Schedule Follow-up Appointment: Yes Clinical Summary of Care: Electronic Signature(s) Signed: 06/26/2021 11:42:46 AM By: [name] EMT Entered By: [name] on 06/26/2021 11:42:46 -------------------------------------------------------------------------------- Patient/Caregiver Education Details Patient Name: Date of Service: [name] [name] 1/17/2023andnbsp7:30 [place] Number: [id number] Patient Account Number: [id number] Date of Birth/Gender: Treating RN: February 09, 1951 (71 y.o. [name] [name] Primary Care Physician: [name] Other Clinician: [name] Referring Physician: Treating Physician/Extender: [name] in Treatment: 6 Education Assessment Education Provided To: Patient Education Topics Provided Electronic Signature(s) Signed: 06/26/2021 11:42:13 AM By: [name] EMT Entered By: [name] on 06/26/2021 11:42:13 -------------------------------------------------------------------------------- Vitals Details Patient Name: Date of Service: [name] [name] 06/26/2021 7:30 A M Medical Record Number: [id number] Patient Account Number: [id number] Date of Birth/Sex: Treating RN: [birth year] (71 y.o. [name] [name] Primary Care Josephene Marrone: [name] Other Clinician: [name] Referring Margareth Kanner: Treating Arielle Eber/Extender: [name] in Treatment: 6 Vital Signs Time Taken: 08:15 Temperature (F): 98.1 Height (in): 74 Pulse (bpm): 70 Weight (lbs): 315 Respiratory Rate (breaths/min): 18 Body Mass Index (BMI): 40.4 Blood Pressure (mmHg): 110/72 Reference Range: 80 - 120 mg / dl Electronic Signature(s) Signed: 06/26/2021 11:25:43 AM By: [name] EMT Entered By: [name] on 06/26/2021 11:25:43

## 2021-06-27 ENCOUNTER — Other Ambulatory Visit: Payer: Self-pay

## 2021-06-27 ENCOUNTER — Encounter (HOSPITAL_BASED_OUTPATIENT_CLINIC_OR_DEPARTMENT_OTHER): Payer: Medicare Other | Admitting: Internal Medicine

## 2021-06-27 DIAGNOSIS — R300 Dysuria: Secondary | ICD-10-CM | POA: Diagnosis not present

## 2021-06-27 DIAGNOSIS — Z79899 Other long term (current) drug therapy: Secondary | ICD-10-CM | POA: Diagnosis not present

## 2021-06-27 DIAGNOSIS — L598 Other specified disorders of the skin and subcutaneous tissue related to radiation: Secondary | ICD-10-CM | POA: Diagnosis not present

## 2021-06-27 DIAGNOSIS — N3040 Irradiation cystitis without hematuria: Secondary | ICD-10-CM | POA: Diagnosis not present

## 2021-06-27 NOTE — Progress Notes (Addendum)
BERTIN, INABINET ([id number]) Visit Report for 06/27/2021 HBO Details Patient Name: Date of Service: [name] [name] 06/27/2021 7:30 A M Medical Record Number: [id number] Patient Account Number: [id number] Date of Birth/Sex: Treating RN: [birth year] (71 y.o. [name] [name], [name] [name] Primary Care Clydette Privitera: [name] [name] Other Clinician: [name] Referring Alegra Rost: Treating Siddh Vandeventer/Extender: [name] in Treatment: 6 HBO Treatment Course Details Treatment Course Number: 1 Ordering Schyler Counsell: [name] T Treatments Ordered: otal 40 HBO Treatment Start Date: 05/29/2021 HBO Indication: Late Effect of Radiation HBO Treatment Details Treatment Number: 15 Patient Type: Outpatient Chamber Type: Monoplace Chamber Serial #: [id number] Treatment Protocol: 2.0 ATA with 90 minutes oxygen, and no air breaks Treatment Details Compression Rate Down: 1.0 psi / minute De-Compression Rate Up: 1.0 psi / minute Air breaks and breathing Decompress Decompress Compress Tx Pressure Begins Reached periods Begins Ends (leave unused spaces blank) Chamber Pressure (ATA 1 2 ------2 1 ) Clock Time (24 hr) 07:54 08:11 - - - - - - 09:41 09:51 Treatment Length: 117 (minutes) Treatment Segments: 4 Vital Signs Capillary Blood Glucose Reference Range: 80 - 120 mg / dl HBO Diabetic Blood Glucose Intervention Range: <131 mg/dl or >249 mg/dl Time Vitals Blood Respiratory Capillary Blood Glucose Pulse Action Type: Pulse: Temperature: Taken: Pressure: Rate: Glucose (mg/dl): Meter #: Oximetry (%) Taken: Pre 07:39 139/85 85 16 97.8 Post 10:00 110/75 62 16 98.7 Treatment Response Treatment Toleration: Well Treatment Completion Status: Treatment Completed without Adverse Event Treatment Notes Information entered from paper intake sheet. Patient checked for ear pain every PSI/Min compression rate. Patient stated that he had no ear pain. Compression rate of 1 PSI/Min.  De-Compression rate of 1 PSI/Min. Patient did not complain of any ear pain. Asked Dr. Dellia Nims if he needed to see the patient after treatment, he stated no. Artisha Capri Notes No concerns today. Right ear looked normal in terms of the tympanic membrane. Doing better with the 2 atm Physician HBO Attestation: I certify that I supervised this HBO treatment in accordance with Medicare guidelines. A trained emergency response team is readily available per Yes hospital policies and procedures. Continue HBOT as ordered. Yes Electronic Signature(s) Signed: 06/28/2021 1:40:49 PM By: [name] EMT Signed: 06/28/2021 4:44:32 PM By: Linton Ham MD Previous Signature: 06/27/2021 4:03:30 PM Version By: Linton Ham MD Previous Signature: 06/27/2021 1:15:43 PM Version By: [name] EMT Previous Signature: 06/27/2021 3:43:46 PM Version By: Linton Ham MD Entered By: [name] on 06/28/2021 13:40:48 -------------------------------------------------------------------------------- HBO Safety Checklist Details Patient Name: Date of Service: [name] [name] 06/27/2021 7:30 A M Medical Record Number: [id number] Patient Account Number: [id number] Date of Birth/Sex: Treating RN: [birth year] (70 y.o. [name] [name], [name] [name] Primary Care [name] [name]: [name] [name] Other Clinician: [name] Referring Briani Maul: Treating Tiffannie Sloss/Extender: [name] in Treatment: 6 HBO Safety Checklist Items Safety Checklist Consent Form Signed Patient voided / foley secured and emptied When did you last eato 0600 Last dose of injectable or oral agent NA Ostomy pouch emptied and vented if applicable NA All implantable devices assessed, documented and approved NA Intravenous access site secured and place NA Valuables secured Linens and cotton and cotton/polyester blend (less than 51% polyester) Personal oil-based products / skin lotions / body lotions removed Wigs or  hairpieces removed NA Smoking or tobacco materials removed Books / newspapers / magazines / loose paper removed Cologne, aftershave, perfume and deodorant removed Jewelry removed (may wrap wedding band) Make-up removed NA Hair care products removed NA Battery operated devices (  external) removed Heating patches and chemical warmers removed Titanium eyewear removed NA Nail polish cured greater than 10 hours Casting material cured greater than 10 hours NA Hearing aids removed NA Loose dentures or partials removed NA Prosthetics have been removed NA Patient demonstrates correct use of air break device (if applicable) Patient concerns have been addressed Patient grounding bracelet on and cord attached to chamber Specifics for Inpatients (complete in addition to above) Medication sheet sent with patient NA Intravenous medications needed or due during therapy sent with patient NA Drainage tubes (e.g. nasogastric tube or chest tube secured and vented) NA Endotracheal or Tracheotomy tube secured NA Cuff deflated of air and inflated with saline NA Airway suctioned NA Electronic Signature(s) Signed: 06/27/2021 8:40:50 AM By: [name] EMT Entered By: [name] on 06/27/2021 08:40:50

## 2021-06-27 NOTE — Progress Notes (Addendum)
[name] [name], [name] [name] ([id number]) Visit Report for 06/27/2021 Arrival Information Details Patient Name: Date of Service: [name] [name] 06/27/2021 7:30 A M Medical Record Number: [id number] Patient Account Number: [id number] Date of Birth/Sex: Treating RN: Apr 05, 1951 (71 y.o. [name], [name] Primary Care Zubayr Bednarczyk: [name] Other Clinician: [name] Referring Kahlia Lagunes: Treating Abegail Kloeppel/Extender: [name] in Treatment: 6 Visit Information History Since Last Visit All ordered tests and consults were completed: Yes Patient Arrived: Ambulatory Added or deleted any medications: No Arrival Time: 07:39 Any new allergies or adverse reactions: No Accompanied By: None Had a fall or experienced change in No Transfer Assistance: None activities of daily living that may affect Patient Identification Verified: Yes risk of falls: Secondary Verification Process Completed: Yes Signs or symptoms of abuse/neglect since last visito No Patient Requires Transmission-Based Precautions: No Hospitalized since last visit: No Patient Has Alerts: No Implantable device outside of the clinic excluding No cellular tissue based products placed in the center since last visit: Pain Present Now: No Electronic Signature(s) Signed: 06/27/2021 8:29:06 AM By: [name] EMT Entered By: [name] on 06/27/2021 08:29:06 -------------------------------------------------------------------------------- Encounter Discharge Information Details Patient Name: Date of Service: [name] [name] 06/27/2021 7:30 A M Medical Record Number: [id number] Patient Account Number: [id number] Date of Birth/Sex: Treating RN: 10-Aug-1950 (71 y.o. [name], [name] Primary Care Lashanda Storlie: [name] Other Clinician: [name] Referring Nataly Pacifico: Treating Kevin Space/Extender: [name] in Treatment: 6 Encounter Discharge Information  Items Discharge Condition: Stable Ambulatory Status: Ambulatory Discharge Destination: Home Transportation: Private Auto Accompanied By: None Schedule Follow-up Appointment: Yes Clinical Summary of Care: Patient Declined Electronic Signature(s) Signed: 06/27/2021 1:17:17 PM By: [name] EMT Entered By: [name] on 06/27/2021 13:17:17 -------------------------------------------------------------------------------- Patient/Caregiver Education Details Patient Name: Date of Service: [name] [name] 1/18/2023andnbsp7:30 [place] Number: [id number] Patient Account Number: [id number] Date of Birth/Gender: Treating RN: Apr 02, 1951 (71 y.o. Erie Noe Primary Care Physician: [name] Other Clinician: [name] Referring Physician: Treating Physician/Extender: [name] in Treatment: 6 Education Assessment Education Provided To: Patient Education Topics Provided Electronic Signature(s) Signed: 06/27/2021 1:16:47 PM By: [name] EMT Entered By: [name] on 06/27/2021 13:16:46 -------------------------------------------------------------------------------- Vitals Details Patient Name: Date of Service: [name] [name] 06/27/2021 7:30 A M Medical Record Number: [id number] Patient Account Number: [id number] Date of Birth/Sex: Treating RN: 23-May-1951 (71 y.o. [name], [name] Primary Care Avleen Bordwell: [name] Other Clinician: [name] Referring Mayson Sterbenz: Treating Ziya Coonrod/Extender: [name] in Treatment: 6 Vital Signs Time Taken: 07:39 Temperature (F): 97.8 Height (in): 74 Pulse (bpm): 85 Weight (lbs): 315 Respiratory Rate (breaths/min): 16 Body Mass Index (BMI): 40.4 Blood Pressure (mmHg): 139/85 Reference Range: 80 - 120 mg / dl Notes Information entered from paper intake sheet. Patient was seen by Dr. Dellia Nims before treatment started to  check his ears. Electronic Signature(s) Signed: 06/27/2021 8:38:25 AM By: [name] EMT Entered By: [name] on 06/27/2021 88:28:00

## 2021-06-27 NOTE — Progress Notes (Addendum)
ROHITH, FAUTH ([id number]) Visit Report for 06/27/2021 Problem List Details Patient Name: Date of Service: [name] [name] 06/27/2021 7:30 A M Medical Record Number: [id number] Patient Account Number: [id number] Date of Birth/Sex: Treating RN: Oct 27, 1950 (71 y.o. 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 05/15/2021 No Yes C61 Malignant neoplasm of prostate 05/15/2021 No Yes R30.0 Dysuria 05/15/2021 No Yes Z00.00 Encounter for general adult medical examination without abnormal findings 05/15/2021 No Yes Inactive Problems Resolved Problems Electronic Signature(s) Signed: 06/27/2021 1:16:14 PM By: [name] EMT Signed: 06/27/2021 3:43:46 PM By: Linton Ham MD Entered By: [name] on 06/27/2021 13:16:14 -------------------------------------------------------------------------------- SuperBill Details Patient Name: Date of Service: [name] [name] 06/27/2021 Medical Record Number: [id number] Patient Account Number: [id number] Date of Birth/Sex: Treating RN: April 29, 1951 (71 y.o. 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 C61 Malignant  neoplasm of prostate R30.0 Dysuria Z00.00 Encounter for general adult medical examination  without abnormal findings Modifier: Quantity: 4 Physician Procedures : CPT4 Code Description Modifier [id number] - WC PHYS HYPERBARIC OXYGEN THERAPY ICD-10 Diagnosis Description 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

## 2021-06-28 ENCOUNTER — Encounter (HOSPITAL_BASED_OUTPATIENT_CLINIC_OR_DEPARTMENT_OTHER): Payer: Medicare Other | Admitting: Internal Medicine

## 2021-06-28 DIAGNOSIS — Z79899 Other long term (current) drug therapy: Secondary | ICD-10-CM | POA: Diagnosis not present

## 2021-06-28 DIAGNOSIS — R300 Dysuria: Secondary | ICD-10-CM

## 2021-06-28 DIAGNOSIS — C61 Malignant neoplasm of prostate: Secondary | ICD-10-CM

## 2021-06-28 DIAGNOSIS — N3040 Irradiation cystitis without hematuria: Secondary | ICD-10-CM

## 2021-06-28 DIAGNOSIS — L598 Other specified disorders of the skin and subcutaneous tissue related to radiation: Secondary | ICD-10-CM | POA: Diagnosis not present

## 2021-06-28 NOTE — Progress Notes (Addendum)
[name], [name] ([id number]) Visit Report for 06/28/2021 HBO Details Patient Name: Date of Service: [name] [name] 06/28/2021 7:30 A M Medical Record Number: [id number] Patient Account Number: [id number] Date of Birth/Sex: Treating RN: February 25, 1951 (71 y.o. [name], [name] Primary Care [name]: [name] Other Clinician: [name] Referring [name]: Treating [name]/Extender: [name] in Treatment: 6 HBO Treatment Course Details Treatment Course Number: 1 Ordering [name]: [name] T Treatments Ordered: otal 40 HBO Treatment Start Date: 05/29/2021 HBO Indication: Late Effect of Radiation HBO Treatment Details Treatment Number: 16 Patient Type: Outpatient Chamber Type: Monoplace Chamber Serial #: [id number] Treatment Protocol: 2.0 ATA with 90 minutes oxygen, and no air breaks Treatment Details Compression Rate Down: 1.0 psi / minute De-Compression Rate Up: 1.0 psi / minute Air breaks and breathing Decompress Decompress Compress Tx Pressure Begins Reached periods Begins Ends (leave unused spaces blank) Chamber Pressure (ATA 1 2 ------2 1 ) Clock Time (24 hr) 07:55 08:10 - - - - - - 09:41 09:56 Treatment Length: 121 (minutes) Treatment Segments: 4 Vital Signs Capillary Blood Glucose Reference Range: 80 - 120 mg / dl HBO Diabetic Blood Glucose Intervention Range: <131 mg/dl or >249 mg/dl Time Vitals Blood Respiratory Capillary Blood Glucose Pulse Action Type: Pulse: Temperature: Taken: Pressure: Rate: Glucose (mg/dl): Meter #: Oximetry (%) Taken: Pre 07:45 114/71 75 16 97.7 Post 09:58 105/85 63 16 98 Treatment Response Treatment Toleration: Well Treatment Completion Status: Treatment Completed without Adverse Event Treatment Notes On compression I checked with the patient every 2 mins, to check if he was able to clear his ears. He gave thumbs up each time. On De-Compression I checked with the patient if he  was able to clear his ears every 4 to 5 min. He gave a thumbs up every time. Patient did well in his treatment today. He fell asleep at one point, and I had to wake him up, before de-compression. Information entered from paper check-in sheet. [name] Notes No concerns with treatment given Physician HBO Attestation: I certify that I supervised this HBO treatment in accordance with Medicare guidelines. A trained emergency response team is readily available per Yes hospital policies and procedures. Continue HBOT as ordered. Yes Electronic Signature(s) Signed: 06/28/2021 4:44:32 PM By: Linton Ham MD Previous Signature: 06/28/2021 1:38:00 PM Version By: [name] EMT Entered By: Linton Ham on 06/28/2021 16:41:41 -------------------------------------------------------------------------------- HBO Safety Checklist Details Patient Name: Date of Service: [name] [name] 06/28/2021 7:30 A M Medical Record Number: [id number] Patient Account Number: [id number] Date of Birth/Sex: Treating RN: 26-Apr-1951 (71 y.o. [name], [name] Primary Care [name]: [name] Other Clinician: [name] Referring [name]: Treating Earlean Fidalgo/Extender: [name] in Treatment: 6 HBO Safety Checklist Items Safety Checklist Consent Form Signed Patient voided / foley secured and emptied When did you last eato 0600 Last dose of injectable or oral agent NA Ostomy pouch emptied and vented if applicable NA All implantable devices assessed, documented and approved NA Intravenous access site secured and place NA Valuables secured Linens and cotton and cotton/polyester blend (less than 51% polyester) Personal oil-based products / skin lotions / body lotions removed Wigs or hairpieces removed NA Smoking or tobacco materials removed Books / newspapers / magazines / loose paper removed Cologne, aftershave, perfume and deodorant removed Jewelry removed  (may wrap wedding band) Make-up removed NA Hair care products removed NA Battery operated devices (external) removed Heating patches and chemical warmers removed Titanium eyewear removed NA Nail polish cured greater than  10 hours Casting material cured greater than 10 hours NA Hearing aids removed NA Loose dentures or partials removed NA Prosthetics have been removed NA Patient demonstrates correct use of air break device (if applicable) Patient concerns have been addressed Patient grounding bracelet on and cord attached to chamber Specifics for Inpatients (complete in addition to above) Medication sheet sent with patient NA Intravenous medications needed or due during therapy sent with patient NA Drainage tubes (e.g. nasogastric tube or chest tube secured and vented) NA Endotracheal or Tracheotomy tube secured NA Cuff deflated of air and inflated with saline NA Airway suctioned NA Notes Information entered from paper intake sheet. Electronic Signature(s) Signed: 06/28/2021 8:19:49 AM By: [name] EMT Entered By: [name] on 06/28/2021 08:19:48

## 2021-06-28 NOTE — Progress Notes (Signed)
MAYNARD, DAVID ([id number]) Visit Report for 06/28/2021 Problem List Details Patient Name: Date of Service: [name] 06/28/2021 7:30 A M Medical Record Number: [id number] Patient Account Number: [id number] Date of Birth/Sex: Treating RN: 1950-08-13 (71 y.o. 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 05/15/2021 No Yes C61 Malignant neoplasm of prostate 05/15/2021 No Yes R30.0 Dysuria 05/15/2021 No Yes Z00.00 Encounter for general adult medical examination without abnormal findings 05/15/2021 No Yes Inactive Problems Resolved Problems Electronic Signature(s) Signed: 06/28/2021 1:38:29 PM By: [name] EMT Signed: 06/28/2021 4:44:32 PM By: Linton Ham MD Entered By: [name] on 06/28/2021 13:38:29 -------------------------------------------------------------------------------- SuperBill Details Patient Name: Date of Service: [name] 06/28/2021 Medical Record Number: [id number] Patient Account Number: [id number] Date of Birth/Sex: Treating RN: [birth year] (71 y.o. 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 C61 Malignant  neoplasm of prostate R30.0 Dysuria Z00.00 Encounter for general adult medical examination  without abnormal findings Modifier: Quantity: 4 Physician Procedures : CPT4 Code Description Modifier [id number] - WC PHYS HYPERBARIC OXYGEN THERAPY ICD-10 Diagnosis Description 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

## 2021-06-28 NOTE — Progress Notes (Addendum)
BLEU, MINERD ([id number]) Visit Report for 06/28/2021 Arrival Information Details Patient Name: Date of Service: [name] [name] 06/28/2021 7:30 A M Medical Record Number: [id number] Patient Account Number: [id number] Date of Birth/Sex: Treating RN: 1950-11-14 (71 y.o. [name], [name] Primary Care Coletta Lockner: [name] Other Clinician: Referring Ceylon Arenson: Treating Priscila Bean/Extender: [name] in Treatment: 6 Visit Information History Since Last Visit All ordered tests and consults were completed: Yes Patient Arrived: Ambulatory Added or deleted any medications: No Arrival Time: 07:35 Any new allergies or adverse reactions: No Accompanied By: None Had a fall or experienced change in No Transfer Assistance: None activities of daily living that may affect Patient Identification Verified: Yes risk of falls: Secondary Verification Process Completed: Yes Signs or symptoms of abuse/neglect since last visito No Patient Requires Transmission-Based Precautions: No Hospitalized since last visit: No Patient Has Alerts: No Implantable device outside of the clinic excluding No cellular tissue based products placed in the center since last visit: Pain Present Now: No Electronic Signature(s) Signed: 06/28/2021 8:16:43 AM By: [name] EMT Entered By: [name] on 06/28/2021 08:16:43 -------------------------------------------------------------------------------- Encounter Discharge Information Details Patient Name: Date of Service: [name] [name] 06/28/2021 7:30 A M Medical Record Number: [id number] Patient Account Number: [id number] Date of Birth/Sex: Treating RN: June 05, 1951 (71 y.o. [name], [name] Primary Care Luceal Hollibaugh: [name] Other Clinician: [name] Referring Asher Babilonia: Treating Ahlam Piscitelli/Extender: [name] in Treatment: 6 Encounter Discharge Information Items Discharge  Condition: Stable Ambulatory Status: Ambulatory Discharge Destination: Home Transportation: Private Auto Accompanied By: None Schedule Follow-up Appointment: Yes Clinical Summary of Care: Electronic Signature(s) Signed: 06/28/2021 1:39:42 PM By: [name] EMT Entered By: [name] on 06/28/2021 13:39:41 -------------------------------------------------------------------------------- Patient/Caregiver Education Details Patient Name: Date of Service: [name] [name] 1/19/2023andnbsp7:30 [place] Number: [id number] Patient Account Number: [id number] Date of Birth/Gender: Treating RN: 1950/11/26 (71 y.o. Erie Noe Primary Care Physician: [name] Other Clinician: [name] Referring Physician: Treating Physician/Extender: [name] in Treatment: 6 Education Assessment Education Provided To: Patient Education Topics Provided Electronic Signature(s) Signed: 06/28/2021 1:39:00 PM By: [name] EMT Entered By: [name] on 06/28/2021 13:39:00 -------------------------------------------------------------------------------- Vitals Details Patient Name: Date of Service: [name] [name] 06/28/2021 7:30 A M Medical Record Number: [id number] Patient Account Number: [id number] Date of Birth/Sex: Treating RN: Jun 28, 1950 (71 y.o. [name], [name] Primary Care Milli Woolridge: [name] Other Clinician: [name] Referring Riddick Nuon: Treating Neal Oshea/Extender: [name] in Treatment: 6 Vital Signs Time Taken: 07:45 Temperature (F): 97.7 Height (in): 74 Pulse (bpm): 75 Weight (lbs): 315 Respiratory Rate (breaths/min): 16 Body Mass Index (BMI): 40.4 Blood Pressure (mmHg): 114/71 Reference Range: 80 - 120 mg / dl Notes Information entered from paper intake sheet. Electronic Signature(s) Signed: 06/28/2021 8:18:24 AM By: [name] EMT Entered By: [name] on 06/28/2021 08:18:23

## 2021-06-29 ENCOUNTER — Other Ambulatory Visit: Payer: Self-pay

## 2021-06-29 ENCOUNTER — Encounter (HOSPITAL_BASED_OUTPATIENT_CLINIC_OR_DEPARTMENT_OTHER): Payer: Medicare Other | Admitting: Internal Medicine

## 2021-06-29 DIAGNOSIS — N3040 Irradiation cystitis without hematuria: Secondary | ICD-10-CM | POA: Diagnosis not present

## 2021-06-29 DIAGNOSIS — C61 Malignant neoplasm of prostate: Secondary | ICD-10-CM | POA: Diagnosis not present

## 2021-06-29 DIAGNOSIS — R300 Dysuria: Secondary | ICD-10-CM | POA: Diagnosis not present

## 2021-06-29 DIAGNOSIS — Z79899 Other long term (current) drug therapy: Secondary | ICD-10-CM | POA: Diagnosis not present

## 2021-06-29 NOTE — Progress Notes (Addendum)
RYKKER, COVIELLO ([id number]) Visit Report for 06/29/2021 HBO Details Patient Name: Date of Service: [name] [name] 06/29/2021 7:30 A M Medical Record Number: [id number] Patient Account Number: [id number] Date of Birth/Sex: Treating RN: [birth year] (71 y.o. [name] Primary Care Kaio Kuhlman: [name] Other Clinician: [name] Referring Tytiana Coles: Treating Kailer Heindel/Extender: [name]: 6 HBO Treatment Course Details Treatment Course Number: 1 Ordering Epic Tribbett: [name] T Treatments Ordered: otal 40 HBO Treatment Start Date: 05/29/2021 HBO Indication: Late Effect of Radiation HBO Treatment Details Treatment Number: 17 Patient Type: Outpatient Chamber Type: Monoplace Chamber Serial #: [id number] Treatment Protocol: 2.0 ATA with 90 minutes oxygen, and no air breaks Treatment Details Compression Rate Down: 1.0 psi / minute De-Compression Rate Up: 1.0 psi / minute Air breaks and breathing Decompress Decompress Compress Tx Pressure Begins Reached periods Begins Ends (leave unused spaces blank) Chamber Pressure (ATA 1 2 ------2 1 ) Clock Time (24 hr) 08:22 08:39 - - - - - - 10:09 10:25 Treatment Length: 123 (minutes) Treatment Segments: 4 Vital Signs Capillary Blood Glucose Reference Range: 80 - 120 mg / dl HBO Diabetic Blood Glucose Intervention Range: <131 mg/dl or >249 mg/dl Time Vitals Blood Respiratory Capillary Blood Glucose Pulse Action Type: Pulse: Temperature: Taken: Pressure: Rate: Glucose (mg/dl): Meter #: Oximetry (%) Taken: Pre 07:59 122/75 68 16 97.9 Post 10:28 118/75 63 16 97.8 Treatment Response Treatment Toleration: Well Treatment Completion Status: Treatment Completed without Adverse Event Treatment Notes I checked with the patient on compression, and de-compression, if he was able to clear his ears. He stated that he was. Paper version used prior to treatment. Physician HBO  Attestation: I certify that I supervised this HBO treatment in accordance with Medicare guidelines. A trained emergency response team is readily available per Yes hospital policies and procedures. Continue HBOT as ordered. Yes Electronic Signature(s) Signed: 06/29/2021 1:33:29 PM By: [name] DO Previous Signature: 06/29/2021 10:56:40 AM Version By: [name] EMT Entered By: [name] on 06/29/2021 13:25:05 -------------------------------------------------------------------------------- HBO Safety Checklist Details Patient Name: Date of Service: [name] [name] 06/29/2021 7:30 A M Medical Record Number: [id number] Patient Account Number: [id number] Date of Birth/Sex: Treating RN: [birth year] (71 y.o. [name] Primary Care Ehtan Delfavero: [name] Other Clinician: [name] Referring Winslow Ederer: Treating Avishai Reihl/Extender: [name]: 6 HBO Safety Checklist Items Safety Checklist Consent Form Signed Patient voided / foley secured and emptied When did you last eato 0600 Last dose of injectable or oral agent n/a Ostomy pouch emptied and vented if applicable NA All implantable devices assessed, documented and approved NA Intravenous access site secured and place NA Valuables secured Linens and cotton and cotton/polyester blend (less than 51% polyester) Personal oil-based products / skin lotions / body lotions removed Wigs or hairpieces removed NA Smoking or tobacco materials removed Books / newspapers / magazines / loose paper removed Cologne, aftershave, perfume and deodorant removed Jewelry removed (may wrap wedding band) Make-up removed NA Hair care products removed NA Battery operated devices (external) removed Heating patches and chemical warmers removed Titanium eyewear removed NA Nail polish cured greater than 10 hours Casting material cured greater than 10 hours NA Hearing aids  removed NA Loose dentures or partials removed NA Prosthetics have been removed NA Patient demonstrates correct use of air break device (if applicable) Patient concerns have been addressed Patient grounding bracelet on and cord attached to chamber Specifics for Inpatients (complete in addition to above) Medication sheet sent  with patient NA Intravenous medications needed or due during therapy sent with patient NA Drainage tubes (e.g. nasogastric tube or chest tube secured and vented) NA Endotracheal or Tracheotomy tube secured NA Cuff deflated of air and inflated with saline NA Airway suctioned NA Notes Paper version used prior to treatment. Electronic Signature(s) Signed: 06/29/2021 10:54:03 AM By: [name] EMT Entered By: [name] on 06/29/2021 10:54:03

## 2021-06-29 NOTE — Progress Notes (Signed)
ASHFORD, CLOUSE ([id number]) Visit Report for 06/29/2021 Problem List Details Patient Name: Date of Service: [name] [name] 06/29/2021 7:30 A M Medical Record Number: [id number] Patient Account Number: [id number] Date of Birth/Sex: Treating RN: 09-24-1950 (71 y.o. 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 05/15/2021 No Yes C61 Malignant neoplasm of prostate 05/15/2021 No Yes R30.0 Dysuria 05/15/2021 No Yes Z00.00 Encounter for general adult medical examination without abnormal findings 05/15/2021 No Yes Inactive Problems Resolved Problems Electronic Signature(s) Signed: 06/29/2021 10:57:32 AM By: [name] EMT Signed: 06/29/2021 1:33:29 PM By: [name] DO Entered By: [name] on 06/29/2021 10:57:32 -------------------------------------------------------------------------------- SuperBill Details Patient Name: Date of Service: [name] [name] 06/29/2021 Medical Record Number: [id number] Patient Account Number: [id number] Date of Birth/Sex: Treating RN: 23-Feb-1951 (71 y.o. 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 C61 Malignant  neoplasm of prostate R30.0 Dysuria Z00.00 Encounter for general adult medical examination  without abnormal findings Modifier: Quantity: 4 Physician Procedures : CPT4 Code Description Modifier [id number] - WC PHYS HYPERBARIC OXYGEN THERAPY ICD-10 Diagnosis Description 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

## 2021-06-29 NOTE — Progress Notes (Addendum)
[name] [name], DEJAYNES ([id number]) Visit Report for 06/29/2021 Arrival Information Details Patient Name: Date of Service: [name] [name] 06/29/2021 7:30 A M Medical Record Number: [id number] Patient Account Number: [id number] Date of Birth/Sex: Treating RN: August 20, 1950 (71 y.o. [name] Primary Care Lillyn Wieczorek: [name] Other Clinician: [name] Referring Buford Gayler: Treating Tenea Sens/Extender: [name] in Treatment: 6 Visit Information History Since Last Visit All ordered tests and consults were completed: Yes Patient Arrived: Ambulatory Added or deleted any medications: No Arrival Time: 07:36 Any new allergies or adverse reactions: No Accompanied By: self Had a fall or experienced change in No Transfer Assistance: None activities of daily living that may affect Patient Identification Verified: Yes risk of falls: Secondary Verification Process Completed: Yes Signs or symptoms of abuse/neglect since last visito No Patient Requires Transmission-Based Precautions: No Hospitalized since last visit: No Patient Has Alerts: No Implantable device outside of the clinic excluding No cellular tissue based products placed in the center since last visit: Pain Present Now: No Notes Paper version used prior to treatment. Electronic Signature(s) Signed: 06/29/2021 10:49:18 AM By: [name] EMT Entered By: [name] on 06/29/2021 10:49:18 -------------------------------------------------------------------------------- Encounter Discharge Information Details Patient Name: Date of Service: [name] [name] 06/29/2021 7:30 A M Medical Record Number: [id number] Patient Account Number: [id number] Date of Birth/Sex: Treating RN: 09-09-1950 (71 y.o. [name] Primary Care Munachimso Palin: [name] Other Clinician: [name] Referring Giavonna Pflum: Treating Cheryle Dark/Extender: [name] in  Treatment: 6 Encounter Discharge Information Items Discharge Condition: Stable Ambulatory Status: Ambulatory Discharge Destination: Home Transportation: Private Auto Accompanied By: self Schedule Follow-up Appointment: Yes Clinical Summary of Care: Patient Declined Electronic Signature(s) Signed: 06/29/2021 11:51:04 AM By: [name] EMT Entered By: [name] on 06/29/2021 10:58:55 -------------------------------------------------------------------------------- Patient/Caregiver Education Details Patient Name: Date of Service: [name] [name] 1/20/2023andnbsp7:30 [place] Number: [id number] Patient Account Number: [id number] Date of Birth/Gender: Treating RN: 1950/10/16 (71 y.o. [name] Primary Care Physician: [name] Other Clinician: [name] Referring Physician: Treating Physician/Extender: [name] in Treatment: 6 Education Assessment Education Provided To: Patient Education Topics Provided Electronic Signature(s) Signed: 06/29/2021 11:51:04 AM By: [name] EMT Entered By: [name] on 06/29/2021 10:58:35 -------------------------------------------------------------------------------- Vitals Details Patient Name: Date of Service: [name] 06/29/2021 7:30 A M Medical Record Number: [id number] Patient Account Number: [id number] Date of Birth/Sex: Treating RN: 1951-02-05 (71 y.o. [name] Primary Care Makendra Vigeant: [name] Other Clinician: [name] Referring Macedonio Scallon: Treating Tamitha Norell/Extender: [name]: 6 Vital Signs Time Taken: 07:59 Temperature (F): 97.9 Height (in): 74 Pulse (bpm): 68 Weight (lbs): 315 Respiratory Rate (breaths/min): 16 Body Mass Index (BMI): 40.4 Blood Pressure (mmHg): 122/75 Reference Range: 80 - 120 mg / dl Notes Paper version used prior to treatment. Electronic  Signature(s) Signed: 06/29/2021 10:50:37 AM By: [name] EMT Entered By: [name] on 06/29/2021 10:50:37

## 2021-07-02 ENCOUNTER — Other Ambulatory Visit: Payer: Self-pay

## 2021-07-02 ENCOUNTER — Encounter (HOSPITAL_BASED_OUTPATIENT_CLINIC_OR_DEPARTMENT_OTHER): Payer: Medicare Other | Admitting: Internal Medicine

## 2021-07-02 DIAGNOSIS — N3040 Irradiation cystitis without hematuria: Secondary | ICD-10-CM | POA: Diagnosis not present

## 2021-07-02 DIAGNOSIS — C61 Malignant neoplasm of prostate: Secondary | ICD-10-CM | POA: Diagnosis not present

## 2021-07-02 DIAGNOSIS — R300 Dysuria: Secondary | ICD-10-CM

## 2021-07-02 DIAGNOSIS — Z79899 Other long term (current) drug therapy: Secondary | ICD-10-CM | POA: Diagnosis not present

## 2021-07-02 NOTE — Progress Notes (Signed)
STYLES, FAMBRO ([id number]) Visit Report for 06/28/2021 Chief Complaint Document Details Patient Name: Date of Service: [name] [name] 06/28/2021 10:30 A M Medical Record Number: [id number] Patient Account Number: [id number] Date of Birth/Sex: Treating RN: 1951-01-29 (71 y.o. [name] Primary Care Provider: [name] Other Clinician: Referring Provider: Treating Provider/Extender: [name]: 6 Information Obtained from: Patient Chief Complaint Discuss hyperbaric oxygen treatment for radiation cystitis Electronic Signature(s) Signed: 06/28/2021 11:07:14 AM By: [name] DO Entered By: [name] on 06/28/2021 10:53:57 -------------------------------------------------------------------------------- HPI Details Patient Name: Date of Service: [name] [name] 06/28/2021 10:30 A M Medical Record Number: [id number] Patient Account Number: [id number] Date of Birth/Sex: Treating RN: 02/03/1951 (71 y.o. [name] Primary Care Provider: [name] Other Clinician: Referring Provider: Treating Provider/Extender: [name]: 6 History of Present Illness HPI Description: Admission 05/15/2021 Mr. [name] [name] is a 71 year old male with a past medical history of prostate cancer status post radiation who was developed radiation cystitis. He states he has dysuria. He denies gross hematuria. He currently denies signs of infection. Patient's prostate cancer was diagnosed on 02/27/2017 by Dr. Link Snuffer. He had intensitymodulated radiation therapy from 09/09/2019 to 10/18/2019 for a total of 28 treatments. Following radiation thearpy he had a cystoscopy and that showed inflammation and edema at the bladder neck/prostate consistent with radiation cystitis. 05/29/2021; patient presents for HBO treatment today. He has no issues or complaints. He reports continued dysuria. He  denies hematuria. He has taken Ativan today to help with claustrophobia. 1/3; patient presents for HBO treatment today. He has completed 5 treatments. After the first 1-2 treatments he developed ear discomfort and followed up with ENT He reports no issues in the past several HBO treatments. He reports improvement in dysuria and continues to have no hematuria. He takes Ativan right . before treatments to help with claustrophobia. 1/19; patient presents for HBO 30-day evaluation. He has completed 16 treatments. Earlier this week he had ear discomfort due to sinus congestion but this has since cleared. He reports no more issues with ear discomfort. He reports continued improvement in dysuria and continues to have no hematuria. He has no issues or complaints today. Electronic Signature(s) Signed: 06/28/2021 11:07:14 AM By: [name] DO Entered By: [name] on 06/28/2021 10:54:57 -------------------------------------------------------------------------------- Physical Exam Details Patient Name: Date of Service: [name] [name] 06/28/2021 10:30 A M Medical Record Number: [id number] Patient Account Number: [id number] Date of Birth/Sex: Treating RN: 1951-02-15 (71 y.o. [name] Primary Care Provider: [name] Other Clinician: Referring Provider: Treating Provider/Extender: [name]: 6 Constitutional respirations regular, non-labored and within target range for patient.[name] Kitchen Psychiatric pleasant and cooperative. Electronic Signature(s) Signed: 06/28/2021 11:07:14 AM By: [name] DO Entered By: [name] on 06/28/2021 10:55:23 -------------------------------------------------------------------------------- Physician Orders Details Patient Name: Date of Service: [name] [name] 06/28/2021 10:30 A M Medical Record Number: [id number] Patient Account Number: [id number] Date of Birth/Sex: Treating  RN: 10-08-1950 (71 y.o. [name] Primary Care Provider: [name] Other Clinician: Referring Provider: Treating Provider/Extender: [name]: 6 Verbal / Phone Orders: No Diagnosis Coding ICD-10 Coding Code Description N30.40 Irradiation cystitis without hematuria C61 Malignant neoplasm of prostate R30.0 Dysuria Z00.00 Encounter for general adult medical examination without abnormal findings Hyperbaric Oxygen Therapy Indication: - Radiation Cystitis 2.0 ATA for 90 Minutes without A Breaks ir Total Number of Treatments: - 40  One treatments per day (delivered Monday through Friday unless otherwise specified in Special Instructions below): Afrin (Oxymetazoline HCL) 0.05% nasal spray - 1 spray in both nostrils daily as needed prior to HBO treatment for difficulty clearing ears Electronic Signature(s) Signed: 06/28/2021 2:15:24 PM By: [name] DO Signed: 07/02/2021 5:11:23 PM By: Levan Hurst RN, BSN Previous Signature: 06/28/2021 12:16:40 PM Version By: [name] DO Entered By: Levan Hurst on 06/28/2021 13:37:28 -------------------------------------------------------------------------------- Problem List Details Patient Name: Date of Service: [name] [name] 06/28/2021 10:30 A M Medical Record Number: [id number] Patient Account Number: [id number] Date of Birth/Sex: Treating RN: Nov 03, 1950 (71 y.o. [name] Primary Care Provider: [name] Other Clinician: Referring Provider: Treating Provider/Extender: [name]: 6 Active Problems ICD-10 Encounter Code Description Active Date MDM Diagnosis N30.40 Irradiation cystitis without hematuria 05/15/2021 No Yes C61 Malignant neoplasm of prostate 05/15/2021 No Yes R30.0 Dysuria 05/15/2021 No Yes Z00.00 Encounter for general adult medical examination without abnormal findings 05/15/2021 No Yes Inactive  Problems Resolved Problems Electronic Signature(s) Signed: 06/28/2021 11:07:14 AM By: [name] DO Entered By: [name] on 06/28/2021 10:53:34 -------------------------------------------------------------------------------- Progress Note Details Patient Name: Date of Service: [name] [name] 06/28/2021 10:30 A M Medical Record Number: [id number] Patient Account Number: [id number] Date of Birth/Sex: Treating RN: 1950/09/20 (71 y.o. [name] Primary Care Provider: [name] Other Clinician: Referring Provider: Treating Provider/Extender: [name]: 6 Subjective Chief Complaint Information obtained from Patient Discuss hyperbaric oxygen treatment for radiation cystitis History of Present Illness (HPI) Admission 05/15/2021 Mr. Ramie Erman is a 71 year old male with a past medical history of prostate cancer status post radiation who was developed radiation cystitis. He states he has dysuria. He denies gross hematuria. He currently denies signs of infection. Patient's prostate cancer was diagnosed on 02/27/2017 by Dr. Link Snuffer. He had intensityoomodulated radiation therapy from 09/09/2019 to 10/18/2019 for a total of 28 treatments. Following radiation thearpy he had a cystoscopy and that showed inflammation and edema at the bladder neck/prostate consistent with radiation cystitis. 05/29/2021; patient presents for HBO treatment today. He has no issues or complaints. He reports continued dysuria. He denies hematuria. He has taken Ativan today to help with claustrophobia. 1/3; patient presents for HBO treatment today. He has completed 5 treatments. After the first 1-2 treatments he developed ear discomfort and followed up with ENT He reports no issues in the past several HBO treatments. He reports improvement in dysuria and continues to have no hematuria. He takes Ativan right . before treatments to help with  claustrophobia. 1/19; patient presents for HBO 30-day evaluation. He has completed 16 treatments. Earlier this week he had ear discomfort due to sinus congestion but this has since cleared. He reports no more issues with ear discomfort. He reports continued improvement in dysuria and continues to have no hematuria. He has no issues or complaints today. Patient History Information obtained from Patient. Family History Cancer - Mother,Father, Diabetes - Mother,Siblings, No family history of Heart Disease, Hereditary Spherocytosis, Hypertension, Kidney Disease, Lung Disease, Seizures, Stroke, Thyroid Problems, Tuberculosis. Social History Never smoker, Marital Status - Married, Alcohol Use - Never, Drug Use - No History, Caffeine Use - Rarely. Medical History Eyes Denies history of Cataracts, Glaucoma, Optic Neuritis Ear/Nose/Mouth/Throat Denies history of Chronic sinus problems/congestion, Middle ear problems Hematologic/Lymphatic Denies history of Anemia, Hemophilia, Human Immunodeficiency Virus, Lymphedema, Sickle Cell Disease Respiratory Denies history of Aspiration, Asthma, Chronic Obstructive Pulmonary Disease (COPD), Pneumothorax, Sleep  Apnea, Tuberculosis Cardiovascular Patient has history of Hypertension Denies history of Angina, Arrhythmia, Congestive Heart Failure, Coronary Artery Disease, Deep Vein Thrombosis, Hypotension, Myocardial Infarction, Peripheral Arterial Disease, Peripheral Venous Disease, Phlebitis, Vasculitis Gastrointestinal Denies history of Cirrhosis , Colitis, Crohnoos, Hepatitis A, Hepatitis B, Hepatitis C Endocrine Denies history of Type I Diabetes, Type II Diabetes Genitourinary Denies history of End Stage Renal Disease Immunological Denies history of Lupus Erythematosus, Raynaudoos, Scleroderma Integumentary (Skin) Denies history of History of Burn Musculoskeletal Denies history of Gout, Rheumatoid Arthritis, Osteoarthritis,  Osteomyelitis Neurologic Denies history of Dementia, Neuropathy, Quadriplegia, Paraplegia, Seizure Disorder Oncologic Patient has history of Received Radiation - 28 treatments 2020 Prostate Ca Denies history of Received Chemotherapy Psychiatric Patient has history of Confinement Anxiety Denies history of Anorexia/bulimia Hospitalization/Surgery History - left ankle fusion 1991. - hernia repair umbilical 15 years ago. Medical A Surgical History Notes nd Cardiovascular hyperlipidemia Gastrointestinal Colon Polyps diverticulosis Genitourinary hematuria radiation cystitis- currently Objective Constitutional respirations regular, non-labored and within target range for patient.[name] Kitchen Psychiatric pleasant and cooperative. Assessment Active Problems ICD-10 Irradiation cystitis without hematuria Malignant neoplasm of prostate Dysuria Encounter for general adult medical examination without abnormal findings Patient has completed 16 treatments of HBO therapy. He is doing well with this. He occasionally has ear discomfort that waxes and wanes based on congestion. Overall he is doing well. I evaluated his ears earlier in the week and the tympanic membranes were easily visualized with no fluid or irritation present. [name] Kitchen He continues to have no hematuria and reports improvement in dysuria. At this time I recommended continuing HBO. Plan 1. Continue HBO therapy Electronic Signature(s) Signed: 06/28/2021 11:07:14 AM By: [name] DO Entered By: [name] on 06/28/2021 11:05:15 -------------------------------------------------------------------------------- HxROS Details Patient Name: Date of Service: [name] [name] 06/28/2021 10:30 A M Medical Record Number: [id number] Patient Account Number: [id number] Date of Birth/Sex: Treating RN: 1951-05-02 (71 y.o. [name] Primary Care Provider: [name] Other Clinician: Referring Provider: Treating Provider/Extender:  [name]: 6 Information Obtained From Patient Eyes Medical History: Negative for: Cataracts; Glaucoma; Optic Neuritis Ear/Nose/Mouth/Throat Medical History: Negative for: Chronic sinus problems/congestion; Middle ear problems Hematologic/Lymphatic Medical History: Negative for: Anemia; Hemophilia; Human Immunodeficiency Virus; Lymphedema; Sickle Cell Disease Respiratory Medical History: Negative for: Aspiration; Asthma; Chronic Obstructive Pulmonary Disease (COPD); Pneumothorax; Sleep Apnea; Tuberculosis Cardiovascular Medical History: Positive for: Hypertension Negative for: Angina; Arrhythmia; Congestive Heart Failure; Coronary Artery Disease; Deep Vein Thrombosis; Hypotension; Myocardial Infarction; Peripheral Arterial Disease; Peripheral Venous Disease; Phlebitis; Vasculitis Past Medical History Notes: hyperlipidemia Gastrointestinal Medical History: Negative for: Cirrhosis ; Colitis; Crohns; Hepatitis A; Hepatitis B; Hepatitis C Past Medical History Notes: Colon Polyps diverticulosis Endocrine Medical History: Negative for: Type I Diabetes; Type II Diabetes Genitourinary Medical History: Negative for: End Stage Renal Disease Past Medical History Notes: hematuria radiation cystitis- currently Immunological Medical History: Negative for: Lupus Erythematosus; Raynauds; Scleroderma Integumentary (Skin) Medical History: Negative for: History of Burn Musculoskeletal Medical History: Negative for: Gout; Rheumatoid Arthritis; Osteoarthritis; Osteomyelitis Neurologic Medical History: Negative for: Dementia; Neuropathy; Quadriplegia; Paraplegia; Seizure Disorder Oncologic Medical History: Positive for: Received Radiation - 28 treatments 2020 Prostate Ca Negative for: Received Chemotherapy Psychiatric Medical History: Positive for: Confinement Anxiety Negative for: Anorexia/bulimia Immunizations Pneumococcal  Vaccine: Received Pneumococcal Vaccination: Yes Received Pneumococcal Vaccination On or After 60th Birthday: Yes Implantable Devices No devices added Hospitalization / Surgery History Type of Hospitalization/Surgery left ankle fusion 1991 hernia repair umbilical 15 years ago Family and Social History Cancer: Yes - Mother,Father; Diabetes:  Yes - Mother,Siblings; Heart Disease: No; Hereditary Spherocytosis: No; Hypertension: No; Kidney Disease: No; Lung Disease: No; Seizures: No; Stroke: No; Thyroid Problems: No; Tuberculosis: No; Never smoker; Marital Status - Married; Alcohol Use: Never; Drug Use: No History; Caffeine Use: Rarely; Financial Concerns: No; Food, Clothing or Shelter Needs: No; Support System Lacking: No; Transportation Concerns: No Electronic Signature(s) Signed: 06/28/2021 11:07:14 AM By: [name] DO Signed: 07/02/2021 5:11:23 PM By: Levan Hurst RN, BSN Entered By: [name] on 06/28/2021 10:55:09 -------------------------------------------------------------------------------- [place] Details Patient Name: Date of Service: [name] [name] 06/28/2021 Medical Record Number: [id number] Patient Account Number: [id number] Date of Birth/Sex: Treating RN: 07/19/1950 (71 y.o. [name] Primary Care Provider: [name] Other Clinician: Referring Provider: Treating Provider/Extender: [name]: 6 Diagnosis Coding ICD-10 Codes Code Description N30.40 Irradiation cystitis without hematuria C61 Malignant neoplasm of prostate R30.0 Dysuria Z00.00 Encounter for general adult medical examination without abnormal findings Facility Procedures CPT4 Code: [id number] Description: [phone] - WOUND CARE VISIT-LEV 2 EST PT Modifier: Quantity: 1 Physician Procedures : CPT4 Code Description Modifier [id number] - WC PHYS LEVEL 3 - EST PT ICD-10 Diagnosis Description N30.40 Irradiation cystitis without hematuria  C61 Malignant neoplasm of prostate R30.0 Dysuria Quantity: 1 Electronic Signature(s) Signed: 06/28/2021 12:16:40 PM By: [name] DO Signed: 07/02/2021 5:11:23 PM By: Levan Hurst RN, BSN Previous Signature: 06/28/2021 11:07:14 AM Version By: [name] DO Entered By: Levan Hurst on 06/28/2021 12:09:26

## 2021-07-02 NOTE — Progress Notes (Addendum)
AKASHDEEP, CHUBA ([id number]) Visit Report for 07/02/2021 HBO Details Patient Name: Date of Service: [name] [name] 07/02/2021 7:30 A M Medical Record Number: [id number] Patient Account Number: [id number] Date of Birth/Sex: Treating RN: [birth year] (71 y.o. [name] Primary Care Alexiana Laverdure: [name] Other Clinician: [name] Referring Suprena Travaglini: Treating Jerrol Helmers/Extender: [name]: 6 HBO Treatment Course Details Treatment Course Number: 1 Ordering Amayah Staheli: [name] T Treatments Ordered: otal 40 HBO Treatment Start Date: 05/29/2021 HBO Indication: Late Effect of Radiation HBO Treatment Details Treatment Number: 18 Patient Type: Outpatient Chamber Type: Monoplace Chamber Serial #: [id number] Treatment Protocol: 2.0 ATA with 90 minutes oxygen, and no air breaks Treatment Details Compression Rate Down: 1.0 psi / minute De-Compression Rate Up: 1.0 psi / minute Air breaks and breathing Decompress Decompress Compress Tx Pressure Begins Reached periods Begins Ends (leave unused spaces blank) Chamber Pressure (ATA 1 2 ------2 1 ) Clock Time (24 hr) 08:33 08:48 - - - - - - 10:19 10:35 Treatment Length: 122 (minutes) Treatment Segments: 4 Vital Signs Capillary Blood Glucose Reference Range: 80 - 120 mg / dl HBO Diabetic Blood Glucose Intervention Range: <131 mg/dl or >249 mg/dl Time Vitals Blood Respiratory Capillary Blood Glucose Pulse Action Type: Pulse: Temperature: Taken: Pressure: Rate: Glucose (mg/dl): Meter #: Oximetry (%) Taken: Pre 07:46 123/74 76 16 97.7 Post 10:37 115/74 57 16 97.6 Treatment Response Treatment Toleration: Well Treatment Completion Status: Treatment Completed without Adverse Event Treatment Notes Information entered from paper check-in sheet. Patient did well with clearing his ears today. Physician HBO Attestation: I certify that I supervised this HBO treatment in  accordance with Medicare guidelines. A trained emergency response team is readily available per Yes hospital policies and procedures. Continue HBOT as ordered. Yes Electronic Signature(s) Signed: 07/02/2021 2:46:44 PM By: [name] DO Previous Signature: 07/02/2021 12:48:47 PM Version By: [name] EMT Entered By: [name] on 07/02/2021 14:45:02 -------------------------------------------------------------------------------- HBO Safety Checklist Details Patient Name: Date of Service: [name] [name] 07/02/2021 7:30 A M Medical Record Number: [id number] Patient Account Number: [id number] Date of Birth/Sex: Treating RN: 06/13/1950 (71 y.o. [name] Primary Care Janicia Monterrosa: [name] Other Clinician: [name] Referring Maksim Peregoy: Treating Unknown Schleyer/Extender: [name]: 6 HBO Safety Checklist Items Safety Checklist Consent Form Signed Patient voided / foley secured and emptied When did you last eato 0600 Last dose of injectable or oral agent NA Ostomy pouch emptied and vented if applicable NA All implantable devices assessed, documented and approved NA Intravenous access site secured and place NA Valuables secured Linens and cotton and cotton/polyester blend (less than 51% polyester) Personal oil-based products / skin lotions / body lotions removed Wigs or hairpieces removed NA Smoking or tobacco materials removed Books / newspapers / magazines / loose paper removed Cologne, aftershave, perfume and deodorant removed Jewelry removed (may wrap wedding band) Make-up removed NA Hair care products removed NA Battery operated devices (external) removed Heating patches and chemical warmers removed Titanium eyewear removed NA Nail polish cured greater than 10 hours Casting material cured greater than 10 hours NA Hearing aids removed NA Loose dentures or partials removed NA Prosthetics have  been removed NA Patient demonstrates correct use of air break device (if applicable) Patient concerns have been addressed Patient grounding bracelet on and cord attached to chamber Specifics for Inpatients (complete in addition to above) Medication sheet sent with patient NA Intravenous medications needed or due during therapy sent with patient NA  Drainage tubes (e.g. nasogastric tube or chest tube secured and vented) NA Endotracheal or Tracheotomy tube secured NA Cuff deflated of air and inflated with saline NA Airway suctioned NA Electronic Signature(s) Signed: 07/02/2021 12:47:17 PM By: [name] EMT Entered By: [name] on 07/02/2021 12:47:17

## 2021-07-02 NOTE — Progress Notes (Signed)
DEMARQUS, JOCSON ([id number]) Visit Report for 06/28/2021 Arrival Information Details Patient Name: Date of Service: [name] 06/28/2021 10:30 A M Medical Record Number: [id number] Patient Account Number: [id number] Date of Birth/Sex: Treating RN: 08/01/1950 (70 y.o. [name] Primary Care Douglas Smolinsky: [name] Other Clinician: Referring Markee Remlinger: Treating Ota Ebersole/Extender: [name]: 6 Visit Information History Since Last Visit Added or deleted any medications: No Patient Arrived: Ambulatory Any new allergies or adverse reactions: No Arrival Time: 09:58 Had a fall or experienced change in No Accompanied By: alone activities of daily living that may affect Transfer Assistance: None risk of falls: Patient Identification Verified: Yes Signs or symptoms of abuse/neglect since last visito No Secondary Verification Process Completed: Yes Hospitalized since last visit: No Patient Requires Transmission-Based Precautions: No Implantable device outside of the clinic excluding No Patient Has Alerts: No cellular tissue based products placed in the center since last visit: Pain Present Now: No Electronic Signature(s) Signed: 07/02/2021 5:11:23 PM By: Levan Hurst RN, BSN Entered By: Levan Hurst on 06/28/2021 12:07:21 -------------------------------------------------------------------------------- Clinic Level of Care Assessment Details Patient Name: Date of Service: [name] 06/28/2021 10:30 A M Medical Record Number: [id number] Patient Account Number: [id number] Date of Birth/Sex: Treating RN: [birth year] (71 y.o. [name] Primary Care Alida Greiner: [name] Other Clinician: Referring Waldemar Siegel: Treating Falon Flinchum/Extender: [name]: 6 Clinic Level of Care Assessment Items TOOL 4 Quantity Score X- 1 0 Use when only an EandM is performed on FOLLOW-UP  visit ASSESSMENTS - Nursing Assessment / Reassessment X- 1 10 Reassessment of Co-morbidities (includes updates in patient status) X- 1 5 Reassessment of Adherence to Treatment Plan ASSESSMENTS - Wound and Skin A ssessment / Reassessment []  - 0 Simple Wound Assessment / Reassessment - one wound []  - 0 Complex Wound Assessment / Reassessment - multiple wounds []  - 0 Dermatologic / Skin Assessment (not related to wound area) ASSESSMENTS - Focused Assessment []  - 0 Circumferential Edema Measurements - multi extremities []  - 0 Nutritional Assessment / Counseling / Intervention []  - 0 Lower Extremity Assessment (monofilament, tuning fork, pulses) []  - 0 Peripheral Arterial Disease Assessment (using hand held doppler) ASSESSMENTS - Ostomy and/or Continence Assessment and Care []  - 0 Incontinence Assessment and Management []  - 0 Ostomy Care Assessment and Management (repouching, etc.) PROCESS - Coordination of Care X - Simple Patient / Family Education for ongoing care 1 15 []  - 0 Complex (extensive) Patient / Family Education for ongoing care X- 1 10 Staff obtains Programmer, systems, Records, T Results / Process Orders est []  - 0 Staff telephones HHA, Nursing Homes / Clarify orders / etc []  - 0 Routine Transfer to another Facility (non-emergent condition) []  - 0 Routine Hospital Admission (non-emergent condition) []  - 0 New Admissions / Biomedical engineer / Ordering NPWT Apligraf, etc. , []  - 0 Emergency Hospital Admission (emergent condition) X- 1 10 Simple Discharge Coordination []  - 0 Complex (extensive) Discharge Coordination PROCESS - Special Needs []  - 0 Pediatric / Minor Patient Management []  - 0 Isolation Patient Management []  - 0 Hearing / Language / Visual special needs []  - 0 Assessment of Community assistance (transportation, D/C planning, etc.) []  - 0 Additional assistance / Altered mentation []  - 0 Support Surface(s) Assessment (bed, cushion, seat,  etc.) INTERVENTIONS - Wound Cleansing / Measurement []  - 0 Simple Wound Cleansing - one wound []  - 0 Complex Wound Cleansing - multiple wounds []  - 0 Wound Imaging (photographs -  any number of wounds) []  - 0 Wound Tracing (instead of photographs) []  - 0 Simple Wound Measurement - one wound []  - 0 Complex Wound Measurement - multiple wounds INTERVENTIONS - Wound Dressings []  - 0 Small Wound Dressing one or multiple wounds []  - 0 Medium Wound Dressing one or multiple wounds []  - 0 Large Wound Dressing one or multiple wounds []  - 0 Application of Medications - topical []  - 0 Application of Medications - injection INTERVENTIONS - Miscellaneous []  - 0 External ear exam []  - 0 Specimen Collection (cultures, biopsies, blood, body fluids, etc.) []  - 0 Specimen(s) / Culture(s) sent or taken to Lab for analysis []  - 0 Patient Transfer (multiple staff / Civil Service fast streamer / Similar devices) []  - 0 Simple Staple / Suture removal (25 or less) []  - 0 Complex Staple / Suture removal (26 or more) []  - 0 Hypo / Hyperglycemic Management (close monitor of Blood Glucose) []  - 0 Ankle / Brachial Index (ABI) - do not check if billed separately X- 1 5 Vital Signs Has the patient been seen at the hospital within the last three years: Yes Total Score: 55 Level Of Care: New/Established - Level 2 Electronic Signature(s) Signed: 07/02/2021 5:11:23 PM By: Levan Hurst RN, BSN Entered By: Levan Hurst on 06/28/2021 12:09:20 -------------------------------------------------------------------------------- Encounter Discharge Information Details Patient Name: Date of Service: [name] 06/28/2021 10:30 A M Medical Record Number: [id number] Patient Account Number: [id number] Date of Birth/Sex: Treating RN: [birth year] (71 y.o. [name] Primary Care Melanee Cordial: [name] Other Clinician: Referring Christoffer Currier: Treating Bryauna Byrum/Extender: [name] Weeks in  Treatment: 6 Encounter Discharge Information Items Discharge Condition: Stable Ambulatory Status: Ambulatory Discharge Destination: Home Transportation: Private Auto Accompanied By: alone Schedule Follow-up Appointment: Yes Clinical Summary of Care: Patient Declined Electronic Signature(s) Signed: 07/02/2021 5:11:23 PM By: Levan Hurst RN, BSN Entered By: Levan Hurst on 06/28/2021 12:09:46 -------------------------------------------------------------------------------- Multi Wound Chart Details Patient Name: Date of Service: [name] 06/28/2021 10:30 A M Medical Record Number: [id number] Patient Account Number: [id number] Date of Birth/Sex: Treating RN: [birth year] (71 y.o. [name] Primary Care Itay Mella: [name] Other Clinician: Referring Keigan Tafoya: Treating Loxley Cibrian/Extender: [name]: 6 Wound Assessments Treatment Notes Electronic Signature(s) Signed: 06/28/2021 11:07:14 AM By: [name] DO Signed: 07/02/2021 5:11:23 PM By: Levan Hurst RN, BSN Entered By: [name] on 06/28/2021 10:53:43 -------------------------------------------------------------------------------- Multi-Disciplinary Care Plan Details Patient Name: Date of Service: [name] 06/28/2021 10:30 A M Medical Record Number: [id number] Patient Account Number: [id number] Date of Birth/Sex: Treating RN: 02-May-1951 (71 y.o. [name] Primary Care Krist Rosenboom: [name] Other Clinician: Referring Erienne Spelman: Treating Valmai Vandenberghe/Extender: [name]: 6 Active Inactive HBO Nursing Diagnoses: Anxiety related to feelings of confinement associated with the hyperbaric oxygen chamber Anxiety related to knowledge deficit of hyperbaric oxygen therapy and treatment procedures Goals: Patient and/or family will be able to state/discuss factors appropriate to the management of  their disease process during treatment Date Initiated: 05/15/2021 Target Resolution Date: 08/10/2021 Goal Status: Active Patient/caregiver will verbalize understanding of HBO goals, rationale, procedures and potential hazards Date Initiated: 05/15/2021 Target Resolution Date: 08/10/2021 Goal Status: Active Interventions: Administer decongestants, per physician orders, prior to HBO2 Assess and provide for patients comfort related to the hyperbaric environment and equalization of middle ear Assess for signs and symptoms related to adverse events, including but not limited to confinement anxiety, pneumothorax, oxygen toxicity and baurotrauma Notes:  Electronic Signature(s) Signed: 07/02/2021 5:11:23 PM By: Levan Hurst RN, BSN Entered By: Levan Hurst on 06/28/2021 12:08:59 -------------------------------------------------------------------------------- Pain Assessment Details Patient Name: Date of Service: [name] 06/28/2021 10:30 A M Medical Record Number: [id number] Patient Account Number: [id number] Date of Birth/Sex: Treating RN: 09-17-1950 (71 y.o. [name] Primary Care Ravinder Lukehart: [name] Other Clinician: Referring Mallory Schaad: Treating Rhyder Koegel/Extender: [name]: 6 Active Problems Location of Pain Severity and Description of Pain Patient Has Paino No Site Locations Pain Management and Medication Current Pain Management: Electronic Signature(s) Signed: 07/02/2021 5:11:23 PM By: Levan Hurst RN, BSN Entered By: Levan Hurst on 06/28/2021 12:07:57 -------------------------------------------------------------------------------- Vitals Details Patient Name: Date of Service: [name] 06/28/2021 10:30 A M Medical Record Number: [id number] Patient Account Number: [id number] Date of Birth/Sex: Treating RN: July 28, 1950 (71 y.o. [name] Primary Care Claramae Rigdon: [name] Other  Clinician: Referring Pahola Dimmitt: Treating Breanah Faddis/Extender: [name]: 6 Vital Signs Time Taken: 09:58 Temperature (F): 98.0 Height (in): 74 Pulse (bpm): 63 Weight (lbs): 315 Respiratory Rate (breaths/min): 16 Body Mass Index (BMI): 40.4 Blood Pressure (mmHg): 105/85 Reference Range: 80 - 120 mg / dl Electronic Signature(s) Signed: 07/02/2021 5:11:23 PM By: Levan Hurst RN, BSN Entered By: Levan Hurst on 06/28/2021 12:07:48

## 2021-07-02 NOTE — Progress Notes (Addendum)
WILKIN, LIPPY ([id number]) Visit Report for 07/02/2021 Arrival Information Details Patient Name: Date of Service: [name] [name] 07/02/2021 7:30 A M Medical Record Number: [id number] Patient Account Number: [id number] Date of Birth/Sex: Treating RN: 04/12/1951 (71 y.o. [name] Primary Care Aleka Twitty: [name] Other Clinician: [name] Referring Rashanda Magloire: Treating Xavien Dauphinais/Extender: [name] in Treatment: 6 Visit Information History Since Last Visit Pain Present Now: No Patient Arrived: Ambulatory Arrival Time: 12:44 Accompanied By: None Transfer Assistance: None Patient Identification Verified: Yes Secondary Verification Process Completed: Yes Patient Requires Transmission-Based Precautions: No Patient Has Alerts: No Electronic Signature(s) Signed: 07/02/2021 12:45:26 PM By: [name] EMT Entered By: [name] on 07/02/2021 12:45:26 -------------------------------------------------------------------------------- Encounter Discharge Information Details Patient Name: Date of Service: [name] [name] 07/02/2021 7:30 A M Medical Record Number: [id number] Patient Account Number: [id number] Date of Birth/Sex: Treating RN: 14-Jan-1951 (71 y.o. [name] Primary Care Kevion Fatheree: [name] Other Clinician: Referring Asianae Minkler: Treating Jaryiah Mehlman/Extender: [name] in Treatment: 6 Encounter Discharge Information Items Discharge Condition: Stable Ambulatory Status: Ambulatory Discharge Destination: Home Transportation: Private Auto Accompanied By: None Schedule Follow-up Appointment: Yes Clinical Summary of Care: Electronic Signature(s) Signed: 07/02/2021 12:50:03 PM By: [name] EMT Entered By: [name] on 07/02/2021 12:50:03 -------------------------------------------------------------------------------- Patient/Caregiver Education Details Patient Name: Date  of Service: [name] [name] 1/23/2023andnbsp7:30 [place] Number: [id number] Patient Account Number: [id number] Date of Birth/Gender: Treating RN: [birth year] (71 y.o. [name] Primary Care Physician: [name] Other Clinician: [name] Referring Physician: Treating Physician/Extender: [name] in Treatment: 6 Education Assessment Education Provided To: Patient Education Topics Provided Electronic Signature(s) Signed: 07/03/2021 2:12:37 PM By: [name] EMT Entered By: [name] on 07/02/2021 12:49:48 -------------------------------------------------------------------------------- Vitals Details Patient Name: Date of Service: [name] [name] 07/02/2021 7:30 A M Medical Record Number: [id number] Patient Account Number: [id number] Date of Birth/Sex: Treating RN: [birth year] (71 y.o. [name] Primary Care Asriel Westrup: [name] Other Clinician: [name] Referring Saretta Dahlem: Treating Johari Pinney/Extender: [name]: 6 Vital Signs Time Taken: 07:46 Temperature (F): 97.7 Height (in): 74 Pulse (bpm): 76 Weight (lbs): 315 Respiratory Rate (breaths/min): 16 Body Mass Index (BMI): 40.4 Blood Pressure (mmHg): 123/74 Reference Range: 80 - 120 mg / dl Electronic Signature(s) Signed: 07/02/2021 12:45:59 PM By: [name] EMT Entered By: [name] on 07/02/2021 12:45:59

## 2021-07-02 NOTE — Progress Notes (Signed)
CANIO, WINOKUR ([id number]) Visit Report for 07/02/2021 Problem List Details Patient Name: Date of Service: [name] [name] 07/02/2021 7:30 A M Medical Record Number: [id number] Patient Account Number: [id number] Date of Birth/Sex: Treating RN: [birth year] (71 y.o. 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 05/15/2021 No Yes C61 Malignant neoplasm of prostate 05/15/2021 No Yes R30.0 Dysuria 05/15/2021 No Yes Z00.00 Encounter for general adult medical examination without abnormal findings 05/15/2021 No Yes Inactive Problems Resolved Problems Electronic Signature(s) Signed: 07/02/2021 12:49:26 PM By: [name] EMT Signed: 07/02/2021 2:46:44 PM By: [name] DO Entered By: [name] on 07/02/2021 12:49:26 -------------------------------------------------------------------------------- SuperBill Details Patient Name: Date of Service: [name] [name] 07/02/2021 Medical Record Number: [id number] Patient Account Number: [id number] Date of Birth/Sex: Treating RN: March 02, 1951 (71 y.o. 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 C61 Malignant neoplasm  of prostate R30.0 Dysuria Z00.00 Encounter for general adult medical examination  without abnormal findings Modifier: Quantity: 4 Physician Procedures : CPT4 Code Description Modifier [id number] [id number] - WC PHYS HYPERBARIC OXYGEN THERAPY ICD-10 Diagnosis Description 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

## 2021-07-03 ENCOUNTER — Encounter (HOSPITAL_BASED_OUTPATIENT_CLINIC_OR_DEPARTMENT_OTHER): Payer: Medicare Other | Admitting: Internal Medicine

## 2021-07-03 DIAGNOSIS — L598 Other specified disorders of the skin and subcutaneous tissue related to radiation: Secondary | ICD-10-CM | POA: Diagnosis not present

## 2021-07-03 DIAGNOSIS — R300 Dysuria: Secondary | ICD-10-CM | POA: Diagnosis not present

## 2021-07-03 DIAGNOSIS — Z79899 Other long term (current) drug therapy: Secondary | ICD-10-CM | POA: Diagnosis not present

## 2021-07-03 DIAGNOSIS — N3040 Irradiation cystitis without hematuria: Secondary | ICD-10-CM | POA: Diagnosis not present

## 2021-07-03 NOTE — Progress Notes (Addendum)
LUBY, [name] [name] ([id number]) Visit Report for 07/03/2021 Arrival Information Details Patient Name: Date of Service: [name] [name] 07/03/2021 7:30 A M Medical Record Number: [id number] Patient Account Number: [id number] Date of Birth/Sex: Treating RN: 12/31/1950 (71 y.o. [name] [name] Primary Care Cash Duce: [name] Other Clinician: [name] Referring Kerwin Augustus: Treating Elam Ellis/Extender: [name] in Treatment: 7 Visit Information History Since Last Visit All ordered tests and consults were completed: Yes Patient Arrived: Ambulatory Added or deleted any medications: No Arrival Time: 07:35 Any new allergies or adverse reactions: No Accompanied By: None Had a fall or experienced change in No Transfer Assistance: None activities of daily living that may affect Patient Identification Verified: Yes risk of falls: Secondary Verification Process Completed: Yes Signs or symptoms of abuse/neglect since last visito No Patient Requires Transmission-Based Precautions: No Hospitalized since last visit: No Patient Has Alerts: No Implantable device outside of the clinic excluding No cellular tissue based products placed in the center since last visit: Pain Present Now: No Notes Information entered from paper intake sheet. Electronic Signature(s) Signed: 07/03/2021 12:37:34 PM By: [name] EMT Entered By: [name] on 07/03/2021 12:37:34 -------------------------------------------------------------------------------- Encounter Discharge Information Details Patient Name: Date of Service: [name] [name] 07/03/2021 7:30 A M Medical Record Number: [id number] Patient Account Number: [id number] Date of Birth/Sex: Treating RN: 1951/04/05 (71 y.o. [name] [name] Primary Care [name]: [name] Other Clinician: [name] Referring [name]: Treating [name]/Extender: [name] in  Treatment: 7 Encounter Discharge Information Items Discharge Condition: Stable Ambulatory Status: Ambulatory Discharge Destination: Home Transportation: Private Auto Accompanied By: None Schedule Follow-up Appointment: Yes Clinical Summary of Care: Electronic Signature(s) Signed: 07/03/2021 12:52:29 PM By: [name] EMT Entered By: [name] on 07/03/2021 12:52:29 -------------------------------------------------------------------------------- Patient/Caregiver Education Details Patient Name: Date of Service: [name] [name] 1/24/2023andnbsp7:30 [name] [name] Number: [id number] Patient Account Number: [id number] Date of Birth/Gender: Treating RN: 01/31/1951 (71 y.o. [name] [name] Primary Care Physician: [name] Other Clinician: [name] Referring Physician: Treating Physician/Extender: [name] in Treatment: 7 Education Assessment Education Provided To: Patient Education Topics Provided Electronic Signature(s) Signed: 07/03/2021 2:12:37 PM By: [name] EMT Entered By: [name] on 07/03/2021 12:51:50 -------------------------------------------------------------------------------- Vitals Details Patient Name: Date of Service: [name] [name] D 07/03/2021 7:30 A M Medical Record Number: [id number] Patient Account Number: [id number] Date of Birth/Sex: Treating RN: Dec 04, 1950 (71 y.o. [name] [name] Primary Care Mckynlee Luse: [name] Other Clinician: [name] Referring Coralyn Roselli: Treating Nicolette Gieske/Extender: [name] in Treatment: 7 Vital Signs Time Taken: 07:51 Temperature (F): 97.6 Height (in): 74 Pulse (bpm): 75 Weight (lbs): 315 Respiratory Rate (breaths/min): 16 Body Mass Index (BMI): 40.4 Blood Pressure (mmHg): 115/74 Reference Range: 80 - 120 mg / dl Electronic Signature(s) Signed: 07/03/2021 12:38:29 PM By: [name] EMT Entered By:  [name] on 07/03/2021 12:38:28

## 2021-07-03 NOTE — Progress Notes (Signed)
MAYKEL, REITTER ([id number]) Visit Report for 07/03/2021 Problem List Details Patient Name: Date of Service: [name] [name] 07/03/2021 7:30 A M Medical Record Number: [id number] Patient Account Number: [id number] Date of Birth/Sex: Treating RN: 07/29/1950 (71 y.o. 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 05/15/2021 No Yes C61 Malignant neoplasm of prostate 05/15/2021 No Yes R30.0 Dysuria 05/15/2021 No Yes Z00.00 Encounter for general adult medical examination without abnormal findings 05/15/2021 No Yes Inactive Problems Resolved Problems Electronic Signature(s) Signed: 07/03/2021 12:50:08 PM By: [name] EMT Signed: 07/03/2021 4:35:26 PM By: Linton Ham MD Entered By: [name] on 07/03/2021 12:50:08 -------------------------------------------------------------------------------- SuperBill Details Patient Name: Date of Service: [name] [name] 07/03/2021 Medical Record Number: [id number] Patient Account Number: [id number] Date of Birth/Sex: Treating RN: [birth year] (71 y.o. 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 C61 Malignant neoplasm of  prostate R30.0 Dysuria Z00.00 Encounter for general adult medical examination  without abnormal findings Modifier: Quantity: 4 Physician Procedures : CPT4 Code Description Modifier [id number] - WC PHYS HYPERBARIC OXYGEN THERAPY ICD-10 Diagnosis Description 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

## 2021-07-03 NOTE — Progress Notes (Addendum)
CHANCE, MUNTER ([id number]) Visit Report for 07/03/2021 HBO Details Patient Name: Date of Service: [name] [name] 07/03/2021 7:30 A M Medical Record Number: [id number] Patient Account Number: [id number] Date of Birth/Sex: Treating RN: Feb 02, 1951 (71 y.o. [name] [name] Primary Care [name] [name]: [name] [name] Other Clinician: [name] Referring Dusten Ellinwood: Treating Ayeden Gladman/Extender: [name] in Treatment: 7 HBO Treatment Course Details Treatment Course Number: 1 Ordering Enora Trillo: [name] T Treatments Ordered: otal 40 HBO Treatment Start Date: 05/29/2021 HBO Indication: Late Effect of Radiation HBO Treatment Details Treatment Number: 19 Patient Type: Outpatient Chamber Type: Monoplace Chamber Serial #: [id number] Treatment Protocol: 2.0 ATA with 90 minutes oxygen, and no air breaks Treatment Details Compression Rate Down: 1.0 psi / minute De-Compression Rate Up: 1.0 psi / minute Air breaks and breathing Decompress Decompress Compress Tx Pressure Begins Reached periods Begins Ends (leave unused spaces blank) Chamber Pressure (ATA 1 2 ------2 1 ) Clock Time (24 hr) 07:56 08:13 - - - - - - 09:44 09:58 Treatment Length: 122 (minutes) Treatment Segments: 4 Vital Signs Capillary Blood Glucose Reference Range: 80 - 120 mg / dl HBO Diabetic Blood Glucose Intervention Range: <131 mg/dl or >249 mg/dl Time Vitals Blood Respiratory Capillary Blood Glucose Pulse Action Type: Pulse: Temperature: Taken: Pressure: Rate: Glucose (mg/dl): Meter #: Oximetry (%) Taken: Pre 07:51 115/74 75 16 97.6 Post 10:01 122/76 63 16 97.7 Treatment Response Treatment Toleration: Well Treatment Completion Status: Treatment Completed without Adverse Event Treatment Notes Information entered from paper intake sheet. Amanee Iacovelli Notes No concerns with treatment given Physician HBO Attestation: I certify that I supervised this HBO treatment in  accordance with Medicare guidelines. A trained emergency response team is readily available per Yes hospital policies and procedures. Continue HBOT as ordered. Yes Electronic Signature(s) Signed: 07/03/2021 4:35:26 PM By: Linton Ham MD Previous Signature: 07/03/2021 12:49:16 PM Version By: [name] EMT Entered By: Linton Ham on 07/03/2021 16:33:20 -------------------------------------------------------------------------------- HBO Safety Checklist Details Patient Name: Date of Service: [name] [name] 07/03/2021 7:30 A M Medical Record Number: [id number] Patient Account Number: [id number] Date of Birth/Sex: Treating RN: 1950-10-27 (71 y.o. [name] [name] Primary Care Lamine Laton: [name] [name] Other Clinician: [name] Referring Joaovictor Krone: Treating Emilygrace Grothe/Extender: [name] in Treatment: 7 HBO Safety Checklist Items Safety Checklist Consent Form Signed Patient voided / foley secured and emptied When did you last eato 0600 Last dose of injectable or oral agent NA Ostomy pouch emptied and vented if applicable NA All implantable devices assessed, documented and approved NA Intravenous access site secured and place NA Valuables secured Linens and cotton and cotton/polyester blend (less than 51% polyester) Personal oil-based products / skin lotions / body lotions removed Wigs or hairpieces removed NA Smoking or tobacco materials removed Books / newspapers / magazines / loose paper removed Cologne, aftershave, perfume and deodorant removed Jewelry removed (may wrap wedding band) Make-up removed NA Hair care products removed NA Battery operated devices (external) removed Heating patches and chemical warmers removed Titanium eyewear removed NA Nail polish cured greater than 10 hours Casting material cured greater than 10 hours Hearing aids removed NA Loose dentures or partials removed NA Prosthetics have been  removed NA Patient demonstrates correct use of air break device (if applicable) Patient concerns have been addressed Patient grounding bracelet on and cord attached to chamber Specifics for Inpatients (complete in addition to above) Medication sheet sent with patient NA Intravenous medications needed or due during therapy sent with patient NA Drainage tubes (  e.g. nasogastric tube or chest tube secured and vented) NA Endotracheal or Tracheotomy tube secured NA Cuff deflated of air and inflated with saline NA Airway suctioned NA Notes Information entered from paper intake sheet. Electronic Signature(s) Signed: 07/03/2021 12:40:48 PM By: [name] EMT Entered By: [name] on 07/03/2021 12:40:47

## 2021-07-04 ENCOUNTER — Encounter (HOSPITAL_BASED_OUTPATIENT_CLINIC_OR_DEPARTMENT_OTHER): Payer: Medicare Other | Admitting: Physician Assistant

## 2021-07-04 ENCOUNTER — Other Ambulatory Visit: Payer: Self-pay

## 2021-07-04 DIAGNOSIS — R300 Dysuria: Secondary | ICD-10-CM | POA: Diagnosis not present

## 2021-07-04 DIAGNOSIS — L598 Other specified disorders of the skin and subcutaneous tissue related to radiation: Secondary | ICD-10-CM | POA: Diagnosis not present

## 2021-07-04 DIAGNOSIS — N3040 Irradiation cystitis without hematuria: Secondary | ICD-10-CM | POA: Diagnosis not present

## 2021-07-04 DIAGNOSIS — Z79899 Other long term (current) drug therapy: Secondary | ICD-10-CM | POA: Diagnosis not present

## 2021-07-04 NOTE — Progress Notes (Signed)
KRISTION, HOLIFIELD ([id number]) Visit Report for 07/04/2021 Problem List Details Patient Name: Date of Service: [name] [name] 07/04/2021 7:30 A M Medical Record Number: [id number] Patient Account Number: [id number] Date of Birth/Sex: Treating RN: February 01, 1951 (71 y.o. 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 05/15/2021 No Yes C61 Malignant neoplasm of prostate 05/15/2021 No Yes R30.0 Dysuria 05/15/2021 No Yes Z00.00 Encounter for general adult medical examination without abnormal findings 05/15/2021 No Yes Inactive Problems Resolved Problems Electronic Signature(s) Signed: 07/04/2021 11:49:30 AM By: [name] EMT Signed: 07/04/2021 3:45:35 PM By: Worthy Keeler PA-C Entered By: [name] on 07/04/2021 11:49:30 -------------------------------------------------------------------------------- SuperBill Details Patient Name: Date of Service: [name] [name] 07/04/2021 Medical Record Number: [id number] Patient Account Number: [id number] Date of Birth/Sex: Treating RN: 10-26-1950 (71 y.o. 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 C61 Malignant  neoplasm of prostate R30.0 Dysuria Z00.00 Encounter for general adult medical examination  without abnormal findings Modifier: Quantity: 4 Physician Procedures : CPT4 Code Description Modifier [id number] - WC PHYS HYPERBARIC OXYGEN THERAPY ICD-10 Diagnosis Description 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

## 2021-07-04 NOTE — Progress Notes (Addendum)
RONEY, YOUTZ ([id number]) Visit Report for 07/04/2021 HBO Details Patient Name: Date of Service: [name] [name] 07/04/2021 7:30 A M Medical Record Number: [id number] Patient Account Number: [id number] Date of Birth/Sex: Treating RN: 1950/09/03 (70 y.o. [name] Primary Care Elishia Kaczorowski: [name] Other Clinician: [name] Referring Everlee Quakenbush: Treating Mirabella Hilario/Extender: [name] Weeks in Treatment: 7 HBO Treatment Course Details Treatment Course Number: 1 Ordering Addilee Neu: [name] T Treatments Ordered: otal 40 HBO Treatment Start Date: 05/29/2021 HBO Indication: Late Effect of Radiation HBO Treatment Details Treatment Number: 20 Patient Type: Outpatient Chamber Type: Monoplace Chamber Serial #: [id number] Treatment Protocol: 2.0 ATA with 90 minutes oxygen, and no air breaks Treatment Details Compression Rate Down: 1.0 psi / minute De-Compression Rate Up: 1.0 psi / minute Air breaks and breathing Decompress Decompress Compress Tx Pressure Begins Reached periods Begins Ends (leave unused spaces blank) Chamber Pressure (ATA 1 2 ------2 1 ) Clock Time (24 hr) 07:57 08:13 - - - - - - 09:43 09:58 Treatment Length: 121 (minutes) Treatment Segments: 4 Vital Signs Capillary Blood Glucose Reference Range: 80 - 120 mg / dl HBO Diabetic Blood Glucose Intervention Range: <131 mg/dl or >249 mg/dl Time Vitals Blood Respiratory Capillary Blood Glucose Pulse Action Type: Pulse: Temperature: Taken: Pressure: Rate: Glucose (mg/dl): Meter #: Oximetry (%) Taken: Pre 07:49 109/79 74 16 97.6 Post 10:01 114/76 63 16 97.5 Treatment Response Treatment Toleration: Well Treatment Completion Status: Treatment Completed without Adverse Event Treatment Notes Information entered from paper intake sheet. Physician HBO Attestation: I certify that I supervised this HBO treatment in accordance with Medicare guidelines. A trained  emergency response team is readily available per Yes hospital policies and procedures. Continue HBOT as ordered. Yes Electronic Signature(s) Signed: 07/04/2021 3:44:17 PM By: Worthy Keeler PA-C Previous Signature: 07/04/2021 11:48:21 AM Version By: [name] EMT Entered By: Worthy Keeler on 07/04/2021 15:44:16 -------------------------------------------------------------------------------- HBO Safety Checklist Details Patient Name: Date of Service: [name] [name] 07/04/2021 7:30 A M Medical Record Number: [id number] Patient Account Number: [id number] Date of Birth/Sex: Treating RN: 12-22-1950 (71 y.o. [name] Primary Care Khalfani Weideman: [name] Other Clinician: [name] Referring Tynesha Free: Treating Montrell Cessna/Extender: [name] Weeks in Treatment: 7 HBO Safety Checklist Items Safety Checklist Consent Form Signed Patient voided / foley secured and emptied When did you last eato 0600 Last dose of injectable or oral agent NA Ostomy pouch emptied and vented if applicable NA All implantable devices assessed, documented and approved NA Intravenous access site secured and place NA Valuables secured Linens and cotton and cotton/polyester blend (less than 51% polyester) Personal oil-based products / skin lotions / body lotions removed Wigs or hairpieces removed NA Smoking or tobacco materials removed Books / newspapers / magazines / loose paper removed Cologne, aftershave, perfume and deodorant removed Jewelry removed (may wrap wedding band) Make-up removed NA Hair care products removed NA Battery operated devices (external) removed Heating patches and chemical warmers removed Titanium eyewear removed NA Nail polish cured greater than 10 hours Casting material cured greater than 10 hours NA Hearing aids removed NA Loose dentures or partials removed NA Prosthetics have been removed NA Patient demonstrates correct use  of air break device (if applicable) Patient concerns have been addressed Patient grounding bracelet on and cord attached to chamber Specifics for Inpatients (complete in addition to above) Medication sheet sent with patient NA Intravenous medications needed or due during therapy sent with patient NA Drainage tubes (e.g. nasogastric  tube or chest tube secured and vented) NA Endotracheal or Tracheotomy tube secured NA Cuff deflated of air and inflated with saline NA Airway suctioned NA Notes Information entered from paper intake sheet. Electronic Signature(s) Signed: 07/04/2021 11:46:58 AM By: [name] EMT Entered By: [name] on 07/04/2021 11:46:58

## 2021-07-04 NOTE — Progress Notes (Addendum)
AUREN, VALDES ([id number]) Visit Report for 07/04/2021 Arrival Information Details Patient Name: Date of Service: [name] [name] 07/04/2021 7:30 A M Medical Record Number: [id number] Patient Account Number: [id number] Date of Birth/Sex: Treating RN: June 13, 1950 (71 y.o. [name] Primary Care Bergen Melle: [name] Other Clinician: [name] Referring Crystallee Werden: Treating Elgene Coral/Extender: [name] Weeks in Treatment: 7 Visit Information History Since Last Visit All ordered tests and consults were completed: Yes Patient Arrived: Ambulatory Added or deleted any medications: No Arrival Time: 07:39 Any new allergies or adverse reactions: No Accompanied By: None Had a fall or experienced change in No Transfer Assistance: None activities of daily living that may affect Patient Identification Verified: Yes risk of falls: Secondary Verification Process Completed: Yes Signs or symptoms of abuse/neglect since last visito No Patient Requires Transmission-Based Precautions: No Hospitalized since last visit: No Patient Has Alerts: No Implantable device outside of the clinic excluding No cellular tissue based products placed in the center since last visit: Pain Present Now: No Notes Information entered from paper intake sheet. Electronic Signature(s) Signed: 07/04/2021 11:45:10 AM By: [name] EMT Entered By: [name] on 07/04/2021 11:45:10 -------------------------------------------------------------------------------- Encounter Discharge Information Details Patient Name: Date of Service: [name] [name] 07/04/2021 7:30 A M Medical Record Number: [id number] Patient Account Number: [id number] Date of Birth/Sex: Treating RN: 05-04-1951 (71 y.o. [name] Primary Care Faolan [place]: [name] Other Clinician: [name] Referring Teandre Hamre: Treating Savier Trickett/Extender: Meridee Score in  Treatment: 7 Encounter Discharge Information Items Discharge Condition: Stable Ambulatory Status: Ambulatory Discharge Destination: Home Transportation: Private Auto Accompanied By: None Schedule Follow-up Appointment: Yes Clinical Summary of Care: Electronic Signature(s) Signed: 07/04/2021 11:50:26 AM By: [name] EMT Entered By: [name] on 07/04/2021 11:50:26 -------------------------------------------------------------------------------- Patient/Caregiver Education Details Patient Name: Date of Service: [name] [name] 1/25/2023andnbsp7:30 [place] Number: [id number] Patient Account Number: [id number] Date of Birth/Gender: Treating RN: 01-08-1951 (71 y.o. [name] Primary Care Physician: [name] Other Clinician: [name] Referring Physician: Treating Physician/Extender: Meridee Score in Treatment: 7 Education Assessment Education Provided To: Patient Education Topics Provided Electronic Signature(s) Signed: 07/04/2021 1:58:31 PM By: [name] EMT Entered By: [name] on 07/04/2021 11:50:04 -------------------------------------------------------------------------------- Vitals Details Patient Name: Date of Service: [name] [name] 07/04/2021 7:30 A M Medical Record Number: [id number] Patient Account Number: [id number] Date of Birth/Sex: Treating RN: 1951-04-14 (71 y.o. [name] Primary Care Trafton Roker: [name] Other Clinician: [name] Referring Dionisio Aragones: Treating Heavyn Yearsley/Extender: [name] Weeks in Treatment: 7 Vital Signs Time Taken: 07:49 Temperature (F): 97.6 Height (in): 74 Pulse (bpm): 74 Weight (lbs): 315 Respiratory Rate (breaths/min): 16 Body Mass Index (BMI): 40.4 Blood Pressure (mmHg): 109/79 Reference Range: 80 - 120 mg / dl Notes Information entered from paper intake sheet. Electronic Signature(s) Signed:  07/04/2021 11:45:50 AM By: [name] EMT Entered By: [name] on 07/04/2021 11:45:50

## 2021-07-05 ENCOUNTER — Encounter (HOSPITAL_BASED_OUTPATIENT_CLINIC_OR_DEPARTMENT_OTHER): Payer: Medicare Other | Admitting: Internal Medicine

## 2021-07-05 DIAGNOSIS — Z79899 Other long term (current) drug therapy: Secondary | ICD-10-CM | POA: Diagnosis not present

## 2021-07-05 DIAGNOSIS — N3040 Irradiation cystitis without hematuria: Secondary | ICD-10-CM | POA: Diagnosis not present

## 2021-07-05 DIAGNOSIS — R300 Dysuria: Secondary | ICD-10-CM | POA: Diagnosis not present

## 2021-07-05 DIAGNOSIS — L598 Other specified disorders of the skin and subcutaneous tissue related to radiation: Secondary | ICD-10-CM | POA: Diagnosis not present

## 2021-07-05 NOTE — Progress Notes (Signed)
AREG, BIALAS ([id number]) Visit Report for 07/05/2021 Problem List Details Patient Name: Date of Service: [name] [name] 07/05/2021 7:30 A M Medical Record Number: [id number] Patient Account Number: [id number] Date of Birth/Sex: Treating RN: 05-Nov-1950 (71 y.o. 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 05/15/2021 No Yes C61 Malignant neoplasm of prostate 05/15/2021 No Yes R30.0 Dysuria 05/15/2021 No Yes Z00.00 Encounter for general adult medical examination without abnormal findings 05/15/2021 No Yes Inactive Problems Resolved Problems Electronic Signature(s) Signed: 07/05/2021 10:30:50 AM By: [name] EMT Signed: 07/05/2021 6:04:31 PM By: Linton Ham MD Entered By: [name] on 07/05/2021 10:30:50 -------------------------------------------------------------------------------- SuperBill Details Patient Name: Date of Service: [name] [name] 07/05/2021 Medical Record Number: [id number] Patient Account Number: [id number] Date of Birth/Sex: Treating RN: [birth year] (71 y.o. 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 C61 Malignant neoplasm of  prostate R30.0 Dysuria Z00.00 Encounter for general adult medical examination  without abnormal findings Modifier: Quantity: 4 Physician Procedures : CPT4 Code Description Modifier [id number] - WC PHYS HYPERBARIC OXYGEN THERAPY ICD-10 Diagnosis Description 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

## 2021-07-06 ENCOUNTER — Other Ambulatory Visit: Payer: Self-pay

## 2021-07-06 ENCOUNTER — Encounter (HOSPITAL_BASED_OUTPATIENT_CLINIC_OR_DEPARTMENT_OTHER): Payer: Medicare Other | Admitting: Internal Medicine

## 2021-07-06 DIAGNOSIS — C61 Malignant neoplasm of prostate: Secondary | ICD-10-CM | POA: Diagnosis not present

## 2021-07-06 DIAGNOSIS — N3040 Irradiation cystitis without hematuria: Secondary | ICD-10-CM | POA: Diagnosis not present

## 2021-07-06 DIAGNOSIS — R300 Dysuria: Secondary | ICD-10-CM

## 2021-07-06 DIAGNOSIS — Z79899 Other long term (current) drug therapy: Secondary | ICD-10-CM | POA: Diagnosis not present

## 2021-07-06 NOTE — Progress Notes (Addendum)
[name] [name] ([id number]) Visit Report for 07/06/2021 Arrival Information Details Patient Name: Date of Service: [name] [name] 07/06/2021 7:30 A M Medical Record Number: [id number] Patient Account Number: [id number] Date of Birth/Sex: Treating RN: Jul 23, 1950 (71 y.o. [name] [name] Primary Care [name] [name]: [name] [name] Other Clinician: Valeria [name] Referring [name] [name]: Treating [name] [name]/Extender: [name] [name] Weeks in Treatment: 7 Visit Information History Since Last Visit All ordered tests and consults were completed: Yes Patient Arrived: Ambulatory Added or deleted any medications: No Arrival Time: 07:33 Any new allergies or adverse reactions: No Accompanied By: None Had a fall or experienced change in No Transfer Assistance: None activities of daily living that may affect Patient Identification Verified: Yes risk of falls: Secondary Verification Process Completed: Yes Signs or symptoms of abuse/neglect since last visito No Patient Requires Transmission-Based Precautions: No Hospitalized since last visit: No Patient Has Alerts: No Implantable device outside of the clinic excluding No cellular tissue based products placed in the center since last visit: Pain Present Now: No Electronic Signature(s) Signed: 07/06/2021 9:49:34 AM By: [name] [name] EMT Entered By: [name] [name] on 07/06/2021 09:49:34 -------------------------------------------------------------------------------- Encounter Discharge Information Details Patient Name: Date of Service: [name] [name] 07/06/2021 7:30 A M Medical Record Number: [id number] Patient Account Number: [id number] Date of Birth/Sex: Treating RN: 1951/05/03 (71 y.o. [name] [name] Primary Care Janvi Ammar: [name] [name] Other Clinician: Valeria [name] Referring Jazzlynn Rawe: Treating [name] [name]/Extender: [name] [name] Weeks in Treatment: 7 Encounter Discharge Information  Items Discharge Condition: Stable Ambulatory Status: Ambulatory Discharge Destination: Home Transportation: Ambulance Accompanied By: None Schedule Follow-up Appointment: Yes Clinical Summary of Care: Electronic Signature(s) Signed: 07/06/2021 10:53:03 AM By: [name] [name] EMT Entered By: [name] [name] on 07/06/2021 10:53:02 -------------------------------------------------------------------------------- Patient/Caregiver Education Details Patient Name: Date of Service: [name] [name] 1/27/2023andnbsp7:30 [place] Record Number: [id number] Patient Account Number: [id number] Date of Birth/Gender: Treating RN: [birth year] (71 y.o. [name] [name] Primary Care Physician: [name] [name] Other Clinician: Valeria [name] Referring Physician: Treating Physician/Extender: [name] [name] in Treatment: 7 Education Assessment Education Provided To: Patient Education Topics Provided Electronic Signature(s) Signed: 07/06/2021 10:53:23 AM By: [name] [name] EMT Entered By: [name] [name] on 07/06/2021 10:43:04 -------------------------------------------------------------------------------- Vitals Details Patient Name: Date of Service: [name] [name] 07/06/2021 7:30 A M Medical Record Number: [id number] Patient Account Number: [id number] Date of Birth/Sex: Treating RN: 1950/09/21 (71 y.o. [name] [name] Primary Care [name] [name]: [name] [name] Other Clinician: Valeria [name] Referring [name] [name]: Treating [name] [name]/Extender: [name] [name] Weeks in Treatment: 7 Vital Signs Time Taken: 07:55 Temperature (F): 97.8 Height (in): 74 Pulse (bpm): 68 Weight (lbs): 315 Respiratory Rate (breaths/min): 18 Body Mass Index (BMI): 40.4 Blood Pressure (mmHg): 108/63 Reference Range: 80 - 120 mg / dl Electronic Signature(s) Signed: 07/06/2021 9:52:33 AM By: [name] [name] EMT Entered By: [name] [name] on 07/06/2021 09:52:32

## 2021-07-06 NOTE — Progress Notes (Signed)
ZADKIEL, DRAGAN ([id number]) Visit Report for 07/06/2021 Problem List Details Patient Name: Date of Service: [name] 07/06/2021 7:30 A M Medical Record Number: [id number] Patient Account Number: [id number] Date of Birth/Sex: Treating RN: 1951-02-16 (71 y.o. 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 05/15/2021 No Yes C61 Malignant neoplasm of prostate 05/15/2021 No Yes R30.0 Dysuria 05/15/2021 No Yes Z00.00 Encounter for general adult medical examination without abnormal findings 05/15/2021 No Yes Inactive Problems Resolved Problems Electronic Signature(s) Signed: 07/06/2021 10:42:16 AM By: [name] EMT Signed: 07/06/2021 1:53:56 PM By: [name] DO Entered By: [name] on 07/06/2021 10:42:15 -------------------------------------------------------------------------------- SuperBill Details Patient Name: Date of Service: [name] 07/06/2021 Medical Record Number: [id number] Patient Account Number: [id number] Date of Birth/Sex: Treating RN: 1950-06-28 (71 y.o. 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 C61 Malignant neoplasm  of prostate R30.0 Dysuria Z00.00 Encounter for general adult medical examination  without abnormal findings Modifier: Quantity: 4 Physician Procedures : CPT4 Code Description Modifier [id number] - WC PHYS HYPERBARIC OXYGEN THERAPY ICD-10 Diagnosis Description 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

## 2021-07-06 NOTE — Progress Notes (Addendum)
[name], [name] ([id number]) Visit Report for 07/06/2021 HBO Details Patient Name: Date of Service: [name] [name] 07/06/2021 7:30 A M Medical Record Number: [id number] Patient Account Number: [id number] Date of Birth/Sex: Treating RN: January 30, 1951 (71 y.o. [name] [name] Primary Care [name] [name]: [name] [name] Other Clinician: Valeria [name] Referring [name] [name]: Treating [name] [name]/Extender: [name] [name] Weeks in Treatment: 7 HBO Treatment Course Details Treatment Course Number: 1 Ordering [name] [name]: [name] [name] T Treatments Ordered: otal 40 HBO Treatment Start Date: 05/29/2021 HBO Indication: Late Effect of Radiation HBO Treatment Details Treatment Number: 22 Patient Type: Outpatient Chamber Type: Monoplace Chamber Serial #: [id number] Treatment Protocol: 2.0 ATA with 90 minutes oxygen, and no air breaks Treatment Details Compression Rate Down: 1.0 psi / minute De-Compression Rate Up: 1.0 psi / minute Air breaks and breathing Decompress Decompress Compress Tx Pressure Begins Reached periods Begins Ends (leave unused spaces blank) Chamber Pressure (ATA 1 2 ------2 1 ) Clock Time (24 hr) 08:29 08:45 - - - - - - 10:15 10:31 Treatment Length: 122 (minutes) Treatment Segments: 4 Vital Signs Capillary Blood Glucose Reference Range: 80 - 120 mg / dl HBO Diabetic Blood Glucose Intervention Range: <131 mg/dl or >249 mg/dl Time Vitals Blood Respiratory Capillary Blood Glucose Pulse Action Type: Pulse: Temperature: Taken: Pressure: Rate: Glucose (mg/dl): Meter #: Oximetry (%) Taken: Pre 07:55 108/63 68 18 97.8 Post 10:33 121/78 55 16 97.8 Treatment Response Treatment Toleration: Well Treatment Completion Status: Treatment Completed without Adverse Event Treatment Notes Information entered from paper intake sheet. Physician HBO Attestation: I certify that I supervised this HBO treatment in accordance with Medicare guidelines. A trained  emergency response team is readily available per Yes hospital policies and procedures. Continue HBOT as ordered. Yes Electronic Signature(s) Signed: 07/06/2021 1:53:56 PM By: [name] Shan DO Previous Signature: 07/06/2021 10:41:19 AM Version By: [name] [name] EMT Entered By: [name] [name] on 07/06/2021 13:52:27 -------------------------------------------------------------------------------- HBO Safety Checklist Details Patient Name: Date of Service: [name] [name] 07/06/2021 7:30 A M Medical Record Number: [id number] Patient Account Number: [id number] Date of Birth/Sex: Treating RN: [birth year] (71 y.o. [name] [name] Primary Care [name] [name]: [name] [name] Other Clinician: Valeria [name] Referring [name] [name]: Treating [name] [name]/Extender: [name] [name] Weeks in Treatment: 7 HBO Safety Checklist Items Safety Checklist Consent Form Signed Patient voided / foley secured and emptied When did you last eato 0600 Last dose of injectable or oral agent NA Ostomy pouch emptied and vented if applicable NA All implantable devices assessed, documented and approved NA Intravenous access site secured and place NA Valuables secured NA Linens and cotton and cotton/polyester blend (less than 51% polyester) Personal oil-based products / skin lotions / body lotions removed Wigs or hairpieces removed NA Smoking or tobacco materials removed Books / newspapers / magazines / loose paper removed Cologne, aftershave, perfume and deodorant removed Jewelry removed (may wrap wedding band) Make-up removed NA Hair care products removed NA Battery operated devices (external) removed Heating patches and chemical warmers removed Titanium eyewear removed NA Nail polish cured greater than 10 hours Casting material cured greater than 10 hours NA Hearing aids removed NA Loose dentures or partials removed NA Prosthetics have been removed NA Patient demonstrates correct  use of air break device (if applicable) Patient concerns have been addressed Patient grounding bracelet on and cord attached to chamber Specifics for Inpatients (complete in addition to above) Medication sheet sent with patient NA Intravenous medications needed or due during therapy sent with patient NA Drainage tubes (e.g. nasogastric tube or chest  tube secured and vented) NA Endotracheal or Tracheotomy tube secured NA Cuff deflated of air and inflated with saline NA Airway suctioned NA Notes Information entered from paper intake sheet. Electronic Signature(s) Signed: 07/06/2021 9:53:39 AM By: [name] [name] EMT Entered By: [name] [name] on 07/06/2021 09:53:39

## 2021-07-09 ENCOUNTER — Encounter (HOSPITAL_BASED_OUTPATIENT_CLINIC_OR_DEPARTMENT_OTHER): Payer: Medicare Other | Admitting: Internal Medicine

## 2021-07-09 ENCOUNTER — Other Ambulatory Visit: Payer: Self-pay

## 2021-07-09 DIAGNOSIS — C61 Malignant neoplasm of prostate: Secondary | ICD-10-CM | POA: Diagnosis not present

## 2021-07-09 DIAGNOSIS — R300 Dysuria: Secondary | ICD-10-CM

## 2021-07-09 DIAGNOSIS — Z79899 Other long term (current) drug therapy: Secondary | ICD-10-CM | POA: Diagnosis not present

## 2021-07-09 DIAGNOSIS — N3040 Irradiation cystitis without hematuria: Secondary | ICD-10-CM | POA: Diagnosis not present

## 2021-07-09 LAB — GLUCOSE, CAPILLARY: Glucose-Capillary: 167 mg/dL — ABNORMAL HIGH (ref 70–99)

## 2021-07-09 NOTE — Progress Notes (Signed)
KOHEN, REITHER ([id number]) Visit Report for 07/09/2021 Problem List Details Patient Name: Date of Service: [name] [name] 07/09/2021 7:30 A M Medical Record Number: [id number] Patient Account Number: [id number] Date of Birth/Sex: Treating RN: 1951/03/20 (71 y.o. 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 05/15/2021 No Yes C61 Malignant neoplasm of prostate 05/15/2021 No Yes R30.0 Dysuria 05/15/2021 No Yes Z00.00 Encounter for general adult medical examination without abnormal findings 05/15/2021 No Yes Inactive Problems Resolved Problems Electronic Signature(s) Signed: 07/09/2021 11:00:04 AM By: [name] EMT Signed: 07/09/2021 2:10:38 PM By: [name] DO Entered By: [name] on 07/09/2021 11:00:04 -------------------------------------------------------------------------------- SuperBill Details Patient Name: Date of Service: [name] [name] 07/09/2021 Medical Record Number: [id number] Patient Account Number: [id number] Date of Birth/Sex: Treating RN: 07/25/1950 (71 y.o. 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 C61 Malignant neoplasm  of prostate R30.0 Dysuria Z00.00 Encounter for general adult medical examination  without abnormal findings Modifier: Quantity: 4 Physician Procedures : CPT4 Code Description Modifier [id number] - WC PHYS HYPERBARIC OXYGEN THERAPY ICD-10 Diagnosis Description 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

## 2021-07-09 NOTE — Progress Notes (Addendum)
DELFIN, SQUILLACE ([id number]) Visit Report for 07/09/2021 Arrival Information Details Patient Name: Date of Service: [name] 07/09/2021 7:30 A M Medical Record Number: [id number] Patient Account Number: [id number] Date of Birth/Sex: Treating RN: 1951-03-26 (71 y.o. [name] Primary Care Alie Hardgrove: [name] Other Clinician: [name] Referring Dakota Vanwart: Treating Magon Croson/Extender: [name] in Treatment: 7 Visit Information History Since Last Visit All ordered tests and consults were completed: Yes Patient Arrived: Ambulatory Added or deleted any medications: No Arrival Time: 07:40 Any new allergies or adverse reactions: No Accompanied By: None Had a fall or experienced change in No Transfer Assistance: None activities of daily living that may affect Patient Identification Verified: Yes risk of falls: Secondary Verification Process Completed: Yes Signs or symptoms of abuse/neglect since last visito No Patient Requires Transmission-Based Precautions: No Hospitalized since last visit: No Patient Has Alerts: No Implantable device outside of the clinic excluding No cellular tissue based products placed in the center since last visit: Pain Present Now: No Notes Information entered from paper intake sheet. Electronic Signature(s) Signed: 07/09/2021 8:53:12 AM By: [name] EMT Entered By: [name] on 07/09/2021 08:53:12 -------------------------------------------------------------------------------- Encounter Discharge Information Details Patient Name: Date of Service: [name] 07/09/2021 7:30 A M Medical Record Number: [id number] Patient Account Number: [id number] Date of Birth/Sex: Treating RN: 10-Aug-1950 (71 y.o. [name] Primary Care Brita Jurgensen: [name] Other Clinician: [name] Referring Elliet Goodnow: Treating Rishi Vicario/Extender: [name] in  Treatment: 7 Encounter Discharge Information Items Discharge Condition: Stable Ambulatory Status: Ambulatory Discharge Destination: Home Transportation: Private Auto Accompanied By: None Schedule Follow-up Appointment: Yes Clinical Summary of Care: Electronic Signature(s) Signed: 07/09/2021 11:00:50 AM By: [name] EMT Entered By: [name] on 07/09/2021 11:00:50 -------------------------------------------------------------------------------- Patient/Caregiver Education Details Patient Name: Date of Service: [name] 1/30/2023andnbsp7:30 [place] Number: [id number] Patient Account Number: [id number] Date of Birth/Gender: Treating RN: 1951/01/10 (71 y.o. [name] Primary Care Physician: [name] Other Clinician: [name] Referring Physician: Treating Physician/Extender: [name] in Treatment: 7 Education Assessment Education Provided To: Patient Education Topics Provided Electronic Signature(s) Signed: 07/10/2021 10:55:30 AM By: [name] EMT Entered By: [name] on 07/09/2021 11:00:31 -------------------------------------------------------------------------------- Vitals Details Patient Name: Date of Service: [name] 07/09/2021 7:30 A M Medical Record Number: [id number] Patient Account Number: [id number] Date of Birth/Sex: Treating RN: June 30, 1950 (71 y.o. [name] Primary Care Aanshi Batchelder: [name] Other Clinician: [name] Referring Rithwik Schmieg: Treating Levone Otten/Extender: [name]: 7 Vital Signs Time Taken: 08:07 Temperature (F): 9 Height (in): 74 Pulse (bpm): 68 Weight (lbs): 315 Respiratory Rate (breaths/min): 16 Body Mass Index (BMI): 40.4 Blood Pressure (mmHg): 113/64 Reference Range: 80 - 120 mg / dl Notes Information entered from paper intake sheet. Electronic Signature(s) Signed:  07/09/2021 8:54:03 AM By: [name] EMT Entered By: [name] on 07/09/2021 08:54:03

## 2021-07-09 NOTE — Progress Notes (Addendum)
[name] [name], [name] [name] ([id number]) Visit Report for 07/09/2021 HBO Details Patient Name: Date of Service: [name] [name] 07/09/2021 7:30 A M Medical Record Number: [id number] Patient Account Number: [id number] Date of Birth/Sex: Treating RN: [birth year] (71 y.o. [name] Primary Care Amaria Mundorf: [name] Other Clinician: [name] Referring Tell Rozelle: Treating Hobie Kohles/Extender: [name]: 7 HBO Treatment Course Details Treatment Course Number: 1 Ordering Rosell Khouri: [name] T Treatments Ordered: otal 40 HBO Treatment Start Date: 05/29/2021 HBO Indication: Late Effect of Radiation HBO Treatment Details Treatment Number: 23 Patient Type: Outpatient Chamber Type: Monoplace Chamber Serial #: [id number] Treatment Protocol: 2.0 ATA with 90 minutes oxygen, and no air breaks Treatment Details Compression Rate Down: 1.0 psi / minute De-Compression Rate Up: 1.0 psi / minute Air breaks and breathing Decompress Decompress Compress Tx Pressure Begins Reached periods Begins Ends (leave unused spaces blank) Chamber Pressure (ATA 1 2 ------2 1 ) Clock Time (24 hr) 08:39 08:55 - - - - - - 09:26 10:40 Treatment Length: 121 (minutes) Treatment Segments: 4 Vital Signs Capillary Blood Glucose Reference Range: 80 - 120 mg / dl HBO Diabetic Blood Glucose Intervention Range: <131 mg/dl or >249 mg/dl Time Vitals Blood Respiratory Capillary Blood Glucose Pulse Action Type: Pulse: Temperature: Taken: Pressure: Rate: Glucose (mg/dl): Meter #: Oximetry (%) Taken: Pre 08:07 113/64 68 16 9 Post 10:44 122/87 58 16 98.2 Treatment Response Treatment Toleration: Well Treatment Completion Status: Treatment Completed without Adverse Event Treatment Notes Information entered from paper intake sheet. Physician HBO Attestation: I certify that I supervised this HBO treatment in accordance with Medicare guidelines. A trained emergency  response team is readily available per Yes hospital policies and procedures. Continue HBOT as ordered. Yes Electronic Signature(s) Signed: 07/09/2021 2:10:38 PM By: [name] DO Previous Signature: 07/09/2021 10:59:26 AM Version By: [name] EMT Entered By: [name] on 07/09/2021 13:56:43 -------------------------------------------------------------------------------- HBO Safety Checklist Details Patient Name: Date of Service: [name] [name] 07/09/2021 7:30 A M Medical Record Number: [id number] Patient Account Number: [id number] Date of Birth/Sex: Treating RN: Apr 23, 1951 (71 y.o. [name] Primary Care Lakesa Coste: [name] Other Clinician: [name] Referring Evalena Fujii: Treating Jodey Burbano/Extender: [name]: 7 HBO Safety Checklist Items Safety Checklist Consent Form Signed Patient voided / foley secured and emptied When did you last eato 0600 Last dose of injectable or oral agent NA Ostomy pouch emptied and vented if applicable NA All implantable devices assessed, documented and approved NA Intravenous access site secured and place NA Valuables secured Linens and cotton and cotton/polyester blend (less than 51% polyester) Personal oil-based products / skin lotions / body lotions removed Wigs or hairpieces removed NA Smoking or tobacco materials removed Books / newspapers / magazines / loose paper removed Cologne, aftershave, perfume and deodorant removed Jewelry removed (may wrap wedding band) Make-up removed NA Hair care products removed NA Battery operated devices (external) removed Heating patches and chemical warmers removed Titanium eyewear removed NA Nail polish cured greater than 10 hours Casting material cured greater than 10 hours NA Hearing aids removed NA Loose dentures or partials removed NA Prosthetics have been removed NA Patient demonstrates correct use of air  break device (if applicable) Patient concerns have been addressed Patient grounding bracelet on and cord attached to chamber Specifics for Inpatients (complete in addition to above) Medication sheet sent with patient NA Intravenous medications needed or due during therapy sent with patient NA Drainage tubes (e.g. nasogastric tube or chest tube  secured and vented) NA Endotracheal or Tracheotomy tube secured NA Cuff deflated of air and inflated with saline NA Airway suctioned Notes Information entered from paper intake sheet. Electronic Signature(s) Signed: 07/09/2021 8:58:55 AM By: [name] EMT Entered By: [name] on 07/09/2021 08:58:54

## 2021-07-10 ENCOUNTER — Encounter (HOSPITAL_BASED_OUTPATIENT_CLINIC_OR_DEPARTMENT_OTHER): Payer: Medicare Other | Admitting: Internal Medicine

## 2021-07-10 DIAGNOSIS — Z79899 Other long term (current) drug therapy: Secondary | ICD-10-CM | POA: Diagnosis not present

## 2021-07-10 DIAGNOSIS — R300 Dysuria: Secondary | ICD-10-CM | POA: Diagnosis not present

## 2021-07-10 DIAGNOSIS — N3040 Irradiation cystitis without hematuria: Secondary | ICD-10-CM | POA: Diagnosis not present

## 2021-07-10 DIAGNOSIS — L598 Other specified disorders of the skin and subcutaneous tissue related to radiation: Secondary | ICD-10-CM | POA: Diagnosis not present

## 2021-07-10 NOTE — Progress Notes (Addendum)
VIRAL, SCHRAMM ([id number]) Visit Report for 07/05/2021 HBO Details Patient Name: Date of Service: [name] 07/05/2021 7:30 A M Medical Record Number: [id number] Patient Account Number: [id number] Date of Birth/Sex: Treating RN: 06/29/1950 (71 y.o. Lorette Ang, Meta.Reding Primary Care Tameika Heckmann: [name] Other Clinician: [name] Referring Tobiah Celestine: Treating Rashea Hoskie/Extender: [name] in Treatment: 7 HBO Treatment Course Details Treatment Course Number: 1 Ordering Kayvan Hoefling: [name] T Treatments Ordered: otal 40 HBO Treatment Start Date: 05/29/2021 HBO Indication: Late Effect of Radiation HBO Treatment Details Treatment Number: 21 Patient Type: Outpatient Chamber Type: Monoplace Chamber Serial #: [id number] Treatment Protocol: 2.0 ATA with 90 minutes oxygen, and no air breaks Treatment Details Compression Rate Down: 1.0 psi / minute De-Compression Rate Up: 1.0 psi / minute Air breaks and breathing Decompress Decompress Compress Tx Pressure Begins Reached periods Begins Ends (leave unused spaces blank) Chamber Pressure (ATA 1 2 ------2 1 ) Clock Time (24 hr) 07:53 08:09 - - - - - - 09:39 09:54 Treatment Length: 121 (minutes) Treatment Segments: 4 Vital Signs Capillary Blood Glucose Reference Range: 80 - 120 mg / dl HBO Diabetic Blood Glucose Intervention Range: <131 mg/dl or >249 mg/dl Time Vitals Blood Respiratory Capillary Blood Glucose Pulse Action Type: Pulse: Temperature: Taken: Pressure: Rate: Glucose (mg/dl): Meter #: Oximetry (%) Taken: Pre 07:49 117/74 76 18 97.6 Post 09:57 124/77 60 18 97.6 Treatment Response Treatment Toleration: Well Treatment Completion Status: Treatment Completed without Adverse Event Treatment Notes Information entered from paper intake sheet. Patient sleep during his treatment. Lillyanne Bradburn Notes No concerns with treatment given Physician HBO Attestation: I certify that I  supervised this HBO treatment in accordance with Medicare guidelines. A trained emergency response team is readily available per Yes hospital policies and procedures. Continue HBOT as ordered. Yes Electronic Signature(s) Signed: 07/05/2021 6:04:31 PM By: Linton Ham MD Previous Signature: 07/05/2021 10:30:02 AM Version By: [name] EMT Previous Signature: 07/05/2021 10:30:02 AM Version By: [name] EMT Entered By: Linton Ham on 07/05/2021 14:37:40 -------------------------------------------------------------------------------- HBO Safety Checklist Details Patient Name: Date of Service: [name] 07/05/2021 7:30 A M Medical Record Number: [id number] Patient Account Number: [id number] Date of Birth/Sex: Treating RN: 12-Feb-1951 (71 y.o. Lorette Ang, Meta.Reding Primary Care Kasaundra Fahrney: [name] Other Clinician: [name] Referring Jyl Chico: Treating Royden Bulman/Extender: [name] in Treatment: 7 HBO Safety Checklist Items Safety Checklist Consent Form Signed Patient voided / foley secured and emptied When did you last eato 0600 Last dose of injectable or oral agent NA Ostomy pouch emptied and vented if applicable NA All implantable devices assessed, documented and approved NA Intravenous access site secured and place NA Valuables secured Linens and cotton and cotton/polyester blend (less than 51% polyester) Personal oil-based products / skin lotions / body lotions removed Wigs or hairpieces removed NA Smoking or tobacco materials removed Books / newspapers / magazines / loose paper removed Cologne, aftershave, perfume and deodorant removed Jewelry removed (may wrap wedding band) Make-up removed NA Hair care products removed NA Battery operated devices (external) removed Heating patches and chemical warmers removed Titanium eyewear removed NA Nail polish cured greater than 10 hours Casting material cured greater  than 10 hours NA Hearing aids removed NA Loose dentures or partials removed NA Prosthetics have been removed NA Patient demonstrates correct use of air break device (if applicable) Patient concerns have been addressed Patient grounding bracelet on and cord attached to chamber Specifics for Inpatients (complete in addition to above) Medication sheet sent  with patient NA Intravenous medications needed or due during therapy sent with patient NA Drainage tubes (e.g. nasogastric tube or chest tube secured and vented) NA Endotracheal or Tracheotomy tube secured NA Cuff deflated of air and inflated with saline NA Airway suctioned NA Notes Information entered from paper intake sheet. Electronic Signature(s) Signed: 07/05/2021 8:37:30 AM By: [name] EMT Previous Signature: 07/05/2021 8:36:28 AM Version By: [name] EMT Entered By: [name] on 07/05/2021 08:37:30

## 2021-07-10 NOTE — Progress Notes (Addendum)
AMAAD, BYERS ([id number]) Visit Report for 07/05/2021 Arrival Information Details Patient Name: Date of Service: [name] [name] 07/05/2021 7:30 A M Medical Record Number: [id number] Patient Account Number: [id number] Date of Birth/Sex: Treating RN: [birth year] (71 y.o. [name] [name], Meta.Reding Primary Care Clearnce Leja: [name] Other Clinician: [name] Referring Jasmene Goswami: Treating Pegah Segel/Extender: [name] in Treatment: 7 Visit Information History Since Last Visit All ordered tests and consults were completed: Yes Patient Arrived: Ambulatory Added or deleted any medications: No Arrival Time: 07:37 Any new allergies or adverse reactions: No Accompanied By: None Had a fall or experienced change in No Transfer Assistance: None activities of daily living that may affect Patient Identification Verified: Yes risk of falls: Secondary Verification Process Completed: Yes Signs or symptoms of abuse/neglect since last visito No Patient Requires Transmission-Based Precautions: No Hospitalized since last visit: No Patient Has Alerts: No Implantable device outside of the clinic excluding No cellular tissue based products placed in the center since last visit: Pain Present Now: No Notes Information entered from paper intake sheet. Electronic Signature(s) Signed: 07/05/2021 8:34:45 AM By: [name] EMT Entered By: [name] on 07/05/2021 08:34:45 -------------------------------------------------------------------------------- Encounter Discharge Information Details Patient Name: Date of Service: [name] [name] 07/05/2021 7:30 A M Medical Record Number: [id number] Patient Account Number: [id number] Date of Birth/Sex: Treating RN: 1951-02-24 (71 y.o. [name] [name] Primary Care Markeisha Mancias: [name] Other Clinician: [name] Referring Decklyn Hornik: Treating Cesilia Shinn/Extender: [name] in  Treatment: 7 Encounter Discharge Information Items Discharge Condition: Stable Ambulatory Status: Ambulatory Discharge Destination: Home Transportation: Private Auto Accompanied By: None Schedule Follow-up Appointment: Yes Clinical Summary of Care: Electronic Signature(s) Signed: 07/05/2021 10:31:52 AM By: [name] EMT Entered By: [name] on 07/05/2021 10:31:51 -------------------------------------------------------------------------------- Patient/Caregiver Education Details Patient Name: Date of Service: [name] [name] 1/26/2023andnbsp7:30 [place] Number: [id number] Patient Account Number: [id number] Date of Birth/Gender: Treating RN: February 12, 1951 (71 y.o. [name] [name] Primary Care Physician: [name] Other Clinician: [name] Referring Physician: Treating Physician/Extender: [name] in Treatment: 7 Education Assessment Education Provided To: Patient Education Topics Provided Electronic Signature(s) Signed: 07/05/2021 10:39:37 AM By: [name] EMT Entered By: [name] on 07/05/2021 10:31:29 -------------------------------------------------------------------------------- Vitals Details Patient Name: Date of Service: [name] [name] 07/05/2021 7:30 A M Medical Record Number: [id number] Patient Account Number: [id number] Date of Birth/Sex: Treating RN: Jan 18, 1951 (71 y.o. [name] [name] Primary Care [place] Fant: [name] Other Clinician: [name] Referring Littie Chiem: Treating Svetlana Bagby/Extender: [name] in Treatment: 7 Vital Signs Time Taken: 07:49 Temperature (F): 97.6 Height (in): 74 Pulse (bpm): 76 Weight (lbs): 315 Respiratory Rate (breaths/min): 18 Body Mass Index (BMI): 40.4 Blood Pressure (mmHg): 117/74 Reference Range: 80 - 120 mg / dl Electronic Signature(s) Signed: 07/05/2021 8:35:18 AM By: [name] EMT Entered By:  [name] on 07/05/2021 08:35:18

## 2021-07-10 NOTE — Progress Notes (Addendum)
ROBI, DEWOLFE ([id number]) Visit Report for 07/10/2021 HBO Details Patient Name: Date of Service: [name] [name] 07/10/2021 7:30 A M Medical Record Number: [id number] Patient Account Number: [id number] Date of Birth/Sex: Treating RN: 1950/07/05 (71 y.o. [name] Primary Care Lovelyn Sheeran: [name] Other Clinician: [name] Referring Saif Peter: Treating Cantrell Martus/Extender: [name] in Treatment: 8 HBO Treatment Course Details Treatment Course Number: 1 Ordering Rhyder Koegel: [name] T Treatments Ordered: otal 40 HBO Treatment Start Date: 05/29/2021 HBO Indication: Late Effect of Radiation HBO Treatment Details Treatment Number: 24 Patient Type: Outpatient Chamber Type: Monoplace Chamber Serial #: [id number] Treatment Protocol: 2.0 ATA with 90 minutes oxygen, and no air breaks Treatment Details Compression Rate Down: 1.0 psi / minute De-Compression Rate Up: 1.0 psi / minute Air breaks and breathing Decompress Decompress Compress Tx Pressure Begins Reached periods Begins Ends (leave unused spaces blank) Chamber Pressure (ATA 1 2 ------2 1 ) Clock Time (24 hr) 07:35 08:10 - - - - - - 09:40 09:54 Treatment Length: 139 (minutes) Treatment Segments: 5 Vital Signs Capillary Blood Glucose Reference Range: 80 - 120 mg / dl HBO Diabetic Blood Glucose Intervention Range: <131 mg/dl or >249 mg/dl Time Vitals Blood Respiratory Capillary Blood Glucose Pulse Action Type: Pulse: Temperature: Taken: Pressure: Rate: Glucose (mg/dl): Meter #: Oximetry (%) Taken: Pre 07:47 121/82 63 16 97.8 Post 09:58 118/85 57 16 98.1 Treatment Response Treatment Toleration: Well Treatment Completion Status: Treatment Completed without Adverse Event Treatment Notes Information entered from paper intake sheet. The patient had no problems clearing his ears today. Eean Buss Notes No concerns with treatment given Physician HBO Attestation: I  certify that I supervised this HBO treatment in accordance with Medicare guidelines. A trained emergency response team is readily available per Yes hospital policies and procedures. Continue HBOT as ordered. Yes Electronic Signature(s) Signed: 07/10/2021 4:28:46 PM By: Linton Ham MD Previous Signature: 07/10/2021 10:48:25 AM Version By: [name] EMT Entered By: Linton Ham on 07/10/2021 16:27:01 -------------------------------------------------------------------------------- HBO Safety Checklist Details Patient Name: Date of Service: [name] [name] D 07/10/2021 7:30 A M Medical Record Number: [id number] Patient Account Number: [id number] Date of Birth/Sex: Treating RN: 1951-05-25 (71 y.o. [name] Primary Care Armstrong Creasy: [name] Other Clinician: [name] Referring Nakesha Ebrahim: Treating Kendle Turbin/Extender: [name] in Treatment: 8 HBO Safety Checklist Items Safety Checklist Consent Form Signed Patient voided / foley secured and emptied When did you last eato 0600 Last dose of injectable or oral agent NA Ostomy pouch emptied and vented if applicable NA All implantable devices assessed, documented and approved NA Intravenous access site secured and place NA Valuables secured Linens and cotton and cotton/polyester blend (less than 51% polyester) Personal oil-based products / skin lotions / body lotions removed Wigs or hairpieces removed NA Smoking or tobacco materials removed Books / newspapers / magazines / loose paper removed Cologne, aftershave, perfume and deodorant removed Jewelry removed (may wrap wedding band) NA Make-up removed NA Hair care products removed NA Battery operated devices (external) removed Heating patches and chemical warmers removed Titanium eyewear removed NA Nail polish cured greater than 10 hours Casting material cured greater than 10 hours NA Hearing aids removed NA Loose  dentures or partials removed NA Prosthetics have been removed NA Patient demonstrates correct use of air break device (if applicable) Patient concerns have been addressed Patient grounding bracelet on and cord attached to chamber Specifics for Inpatients (complete in addition to above) Medication sheet sent with patient NA Intravenous medications  needed or due during therapy sent with patient NA Drainage tubes (e.g. nasogastric tube or chest tube secured and vented) NA Endotracheal or Tracheotomy tube secured NA Cuff deflated of air and inflated with saline NA Airway suctioned NA Notes Information entered from paper intake sheet. Electronic Signature(s) Signed: 07/10/2021 10:46:34 AM By: [name] EMT Entered By: [name] on 07/10/2021 10:46:34

## 2021-07-10 NOTE — Progress Notes (Addendum)
SCOTTIE, METAYER ([id number]) Visit Report for 07/10/2021 Arrival Information Details Patient Name: Date of Service: [name] [name] 07/10/2021 7:30 A M Medical Record Number: [id number] Patient Account Number: [id number] Date of Birth/Sex: Treating RN: 16-Oct-1950 (71 y.o. [name] [name] Primary Care Waniya Hoglund: [name] Other Clinician: [name] Referring Dang Mathison: Treating Aliceson Dolbow/Extender: [name] in Treatment: 8 Visit Information History Since Last Visit All ordered tests and consults were completed: Yes Patient Arrived: Ambulatory Added or deleted any medications: No Arrival Time: 07:35 Any new allergies or adverse reactions: No Accompanied By: None Had a fall or experienced change in No Transfer Assistance: None activities of daily living that may affect Patient Identification Verified: Yes risk of falls: Secondary Verification Process Completed: Yes Signs or symptoms of abuse/neglect since last visito No Patient Requires Transmission-Based Precautions: No Hospitalized since last visit: No Patient Has Alerts: No Implantable device outside of the clinic excluding No cellular tissue based products placed in the center since last visit: Pain Present Now: No Notes Information entered from paper intake sheet. Electronic Signature(s) Signed: 07/10/2021 10:43:26 AM By: [name] EMT Entered By: [name] on 07/10/2021 10:43:25 -------------------------------------------------------------------------------- Encounter Discharge Information Details Patient Name: Date of Service: [name] [name] 07/10/2021 7:30 A M Medical Record Number: [id number] Patient Account Number: [id number] Date of Birth/Sex: Treating RN: [birth year] (71 y.o. [name] [name] Primary Care Raysha Tilmon: [name] Other Clinician: [name] Referring Tatsuo Musial: Treating Nea Gittens/Extender: [name] in  Treatment: 8 Encounter Discharge Information Items Discharge Condition: Stable Ambulatory Status: Ambulatory Discharge Destination: Home Transportation: Private Auto Accompanied By: None Schedule Follow-up Appointment: Yes Clinical Summary of Care: Electronic Signature(s) Signed: 07/10/2021 10:50:03 AM By: [name] EMT Entered By: [name] on 07/10/2021 10:50:03 -------------------------------------------------------------------------------- Patient/Caregiver Education Details Patient Name: Date of Service: [name] [name] 1/31/2023andnbsp7:30 [name] [name] Number: [id number] Patient Account Number: [id number] Date of Birth/Gender: Treating RN: May 26, 1951 (71 y.o. [name] [name] Primary Care Physician: [name] Other Clinician: [name] Referring Physician: Treating Physician/Extender: [name] in Treatment: 8 Education Assessment Education Provided To: Patient Education Topics Provided Electronic Signature(s) Signed: 07/10/2021 10:55:30 AM By: [name] EMT Entered By: [name] on 07/10/2021 10:49:44 -------------------------------------------------------------------------------- Vitals Details Patient Name: Date of Service: [name] [name] 07/10/2021 7:30 A M Medical Record Number: [id number] Patient Account Number: [id number] Date of Birth/Sex: Treating RN: [birth year] (71 y.o. [name] [name] Primary Care Kelia Gibbon: [name] Other Clinician: [name] Referring Breyanna Valera: Treating Gearl Baratta/Extender: [name] in Treatment: 8 Vital Signs Time Taken: 07:47 Temperature (F): 97.8 Height (in): 74 Pulse (bpm): 63 Weight (lbs): 315 Respiratory Rate (breaths/min): 16 Body Mass Index (BMI): 40.4 Blood Pressure (mmHg): 121/82 Reference Range: 80 - 120 mg / dl Notes Information entered from paper intake sheet. Electronic Signature(s) Signed:  07/10/2021 10:44:36 AM By: [name] EMT Previous Signature: 07/10/2021 10:43:59 AM Version By: [name] EMT Entered By: [name] on 07/10/2021 10:44:36

## 2021-07-10 NOTE — Progress Notes (Signed)
CURRY, SEEFELDT ([id number]) Visit Report for 07/10/2021 Problem List Details Patient Name: Date of Service: [name] [name] 07/10/2021 7:30 A M Medical Record Number: [id number] Patient Account Number: [id number] Date of Birth/Sex: Treating RN: 12-29-1950 (71 y.o. 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 05/15/2021 No Yes C61 Malignant neoplasm of prostate 05/15/2021 No Yes R30.0 Dysuria 05/15/2021 No Yes Z00.00 Encounter for general adult medical examination without abnormal findings 05/15/2021 No Yes Inactive Problems Resolved Problems Electronic Signature(s) Signed: 07/10/2021 10:49:18 AM By: [name] EMT Signed: 07/10/2021 4:28:46 PM By: Linton Ham MD Entered By: [name] on 07/10/2021 10:49:17 -------------------------------------------------------------------------------- SuperBill Details Patient Name: Date of Service: [name] [name] 07/10/2021 Medical Record Number: [id number] Patient Account Number: [id number] Date of Birth/Sex: Treating RN: 06-28-1950 (71 y.o. 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 C61 Malignant neoplasm of  prostate R30.0 Dysuria Z00.00 Encounter for general adult medical examination  without abnormal findings Modifier: Quantity: 5 Physician Procedures : CPT4 Code Description Modifier [id number] - WC PHYS HYPERBARIC OXYGEN THERAPY ICD-10 Diagnosis Description 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

## 2021-07-11 ENCOUNTER — Encounter (HOSPITAL_BASED_OUTPATIENT_CLINIC_OR_DEPARTMENT_OTHER): Payer: Medicare Other | Attending: Physician Assistant | Admitting: Physician Assistant

## 2021-07-11 ENCOUNTER — Other Ambulatory Visit: Payer: Self-pay

## 2021-07-11 DIAGNOSIS — C61 Malignant neoplasm of prostate: Secondary | ICD-10-CM | POA: Insufficient documentation

## 2021-07-11 DIAGNOSIS — N3040 Irradiation cystitis without hematuria: Secondary | ICD-10-CM | POA: Diagnosis not present

## 2021-07-11 DIAGNOSIS — R300 Dysuria: Secondary | ICD-10-CM | POA: Insufficient documentation

## 2021-07-11 DIAGNOSIS — L598 Other specified disorders of the skin and subcutaneous tissue related to radiation: Secondary | ICD-10-CM | POA: Diagnosis not present

## 2021-07-11 NOTE — Progress Notes (Addendum)
MATHEU, PLOEGER ([id number]) Visit Report for 07/11/2021 Arrival Information Details Patient Name: Date of Service: [name] [name] 07/11/2021 7:30 A M Medical Record Number: [id number] Patient Account Number: [id number] Date of Birth/Sex: Treating RN: [birth year] (71 y.o. [name] Primary Care Chasya Zenz: [name] Other Clinician: Referring Reeshemah Nazaryan: Treating Ronnette Rump/Extender: [name] Weeks in Treatment: 8 Visit Information History Since Last Visit All ordered tests and consults were completed: Yes Patient Arrived: Ambulatory Added or deleted any medications: No Arrival Time: 07:37 Any new allergies or adverse reactions: No Accompanied By: None Had a fall or experienced change in No Transfer Assistance: None activities of daily living that may affect Patient Identification Verified: Yes risk of falls: Secondary Verification Process Completed: Yes Signs or symptoms of abuse/neglect since last visito No Patient Requires Transmission-Based Precautions: No Hospitalized since last visit: No Patient Has Alerts: No Implantable device outside of the clinic excluding No cellular tissue based products placed in the center since last visit: Pain Present Now: No Electronic Signature(s) Signed: 07/11/2021 10:35:56 AM By: [name] EMT Entered By: [name] on 07/11/2021 10:35:56 -------------------------------------------------------------------------------- Encounter Discharge Information Details Patient Name: Date of Service: [name] [name] 07/11/2021 7:30 [place] Record Number: [id number] Patient Account Number: [id number] Date of Birth/Sex: Treating RN: [birth year] (71 y.o. [name] Primary Care Aaleyah Witherow: [name] Other Clinician: [name] Referring Kaylah Chiasson: Treating Amarilis Belflower/Extender: [name] Weeks in Treatment: 8 Encounter Discharge Information Items Discharge Condition:  Stable Ambulatory Status: Ambulatory Discharge Destination: Home Transportation: Private Auto Accompanied By: None Schedule Follow-up Appointment: Yes Clinical Summary of Care: Electronic Signature(s) Signed: 07/11/2021 10:59:38 AM By: [name] EMT Entered By: [name] on 07/11/2021 10:59:38 -------------------------------------------------------------------------------- Patient/Caregiver Education Details Patient Name: Date of Service: [name] [name] 2/1/2023andnbsp7:30 A M Medical Record Number: [id number] Patient Account Number: [id number] Date of Birth/Gender: Treating RN: 02-24-1951 (71 y.o. [name] Primary Care Physician: [name] Other Clinician: [name] Referring Physician: Treating Physician/Extender: Meridee Score in Treatment: 8 Education Assessment Education Provided To: Patient Education Topics Provided Electronic Signature(s) Signed: 07/11/2021 11:00:00 AM By: [name] EMT Entered By: [name] on 07/11/2021 10:59:19 -------------------------------------------------------------------------------- Vitals Details Patient Name: Date of Service: [name] [name] 07/11/2021 7:30 A M Medical Record Number: [id number] Patient Account Number: [id number] Date of Birth/Sex: Treating RN: May 20, 1951 (71 y.o. [name] Primary Care Tobechukwu Emmick: [name] Other Clinician: [name] Referring Maribel Hadley: Treating Jestina Stephani/Extender: [name] Weeks in Treatment: 8 Vital Signs Time Taken: 07:52 Temperature (F): 97.9 Height (in): 74 Pulse (bpm): 67 Weight (lbs): 315 Respiratory Rate (breaths/min): 16 Body Mass Index (BMI): 40.4 Blood Pressure (mmHg): 110/66 Reference Range: 80 - 120 mg / dl Notes Information entered from paper intake sheet. Electronic Signature(s) Signed: 07/11/2021 10:42:45 AM By: [name] EMT Entered By: [name] on  07/11/2021 10:42:45

## 2021-07-11 NOTE — Progress Notes (Signed)
ROYER, CRISTOBAL ([id number]) Visit Report for 07/11/2021 Problem List Details Patient Name: Date of Service: [name] [name] 07/11/2021 7:30 A M Medical Record Number: [id number] Patient Account Number: [id number] Date of Birth/Sex: Treating RN: 03-05-1951 (71 y.o. 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 05/15/2021 No Yes C61 Malignant neoplasm of prostate 05/15/2021 No Yes R30.0 Dysuria 05/15/2021 No Yes Z00.00 Encounter for general adult medical examination without abnormal findings 05/15/2021 No Yes Inactive Problems Resolved Problems Electronic Signature(s) Signed: 07/11/2021 10:58:54 AM By: [name] EMT Signed: 07/11/2021 3:56:32 PM By: Worthy Keeler PA-C Entered By: [name] on 07/11/2021 10:58:54 -------------------------------------------------------------------------------- SuperBill Details Patient Name: Date of Service: [name] [name] 07/11/2021 Medical Record Number: [id number] Patient Account Number: [id number] Date of Birth/Sex: Treating RN: 31-Oct-1950 (71 y.o. 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 C61 Malignant neoplasm of  prostate R30.0 Dysuria Z00.00 Encounter for general adult medical examination  without abnormal findings Modifier: Quantity: 4 Physician Procedures : CPT4 Code Description Modifier [id number] - WC PHYS HYPERBARIC OXYGEN THERAPY ICD-10 Diagnosis Description 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

## 2021-07-11 NOTE — Progress Notes (Addendum)
[name], [name] ([id number]) Visit Report for 07/11/2021 HBO Details Patient Name: Date of Service: [name] [name] 07/11/2021 7:30 A M Medical Record Number: [id number] Patient Account Number: [id number] Date of Birth/Sex: Treating RN: 1951-03-30 (71 y.o. [name] [name] Primary Care Sha Burling: [name] Other Clinician: [name] Referring Marnee Sherrard: Treating Nikia Mangino/Extender: [name] Weeks in Treatment: 8 HBO Treatment Course Details Treatment Course Number: 1 Ordering Andreanna Mikolajczak: [name] T Treatments Ordered: otal 40 HBO Treatment Start Date: 05/29/2021 HBO Indication: Late Effect of Radiation HBO Treatment Details Treatment Number: 25 Patient Type: Outpatient Chamber Type: Monoplace Chamber Serial #: [id number] Treatment Protocol: 2.0 ATA with 90 minutes oxygen, and no air breaks Treatment Details Compression Rate Down: 1.0 psi / minute De-Compression Rate Up: 1.0 psi / minute Air breaks and breathing Decompress Decompress Compress Tx Pressure Begins Reached periods Begins Ends (leave unused spaces blank) Chamber Pressure (ATA 1 2 ------2 1 ) Clock Time (24 hr) 07:56 08:14 - - - - - - 09:44 09:58 Treatment Length: 122 (minutes) Treatment Segments: 4 Vital Signs Capillary Blood Glucose Reference Range: 80 - 120 mg / dl HBO Diabetic Blood Glucose Intervention Range: <131 mg/dl or >249 mg/dl Time Vitals Blood Respiratory Capillary Blood Glucose Pulse Action Type: Pulse: Temperature: Taken: Pressure: Rate: Glucose (mg/dl): Meter #: Oximetry (%) Taken: Pre 07:52 110/66 67 16 97.9 Post 10:01 124/72 62 16 97.5 Treatment Response Treatment Toleration: Well Treatment Completion Status: Treatment Completed without Adverse Event Treatment Notes Information entered from paper intake sheet. The patient had no problems clearing his ears today. Physician HBO Attestation: I certify that I supervised this HBO treatment in  accordance with Medicare guidelines. A trained emergency response team is readily available per Yes hospital policies and procedures. Continue HBOT as ordered. Yes Electronic Signature(s) Signed: 07/11/2021 3:55:03 PM By: Worthy Keeler PA-C Previous Signature: 07/11/2021 10:46:34 AM Version By: [name] EMT Entered By: Worthy Keeler on 07/11/2021 15:55:02 -------------------------------------------------------------------------------- HBO Safety Checklist Details Patient Name: Date of Service: [name] [name] 07/11/2021 7:30 A M Medical Record Number: [id number] Patient Account Number: [id number] Date of Birth/Sex: Treating RN: March 18, 1951 (71 y.o. [name] [name] Primary Care Anivea Velasques: [name] Other Clinician: [name] Referring Rylinn Linzy: Treating Dinero Chavira/Extender: [name] Weeks in Treatment: 8 HBO Safety Checklist Items Safety Checklist Consent Form Signed Patient voided / foley secured and emptied When did you last eato 0600 Last dose of injectable or oral agent NA Ostomy pouch emptied and vented if applicable NA All implantable devices assessed, documented and approved NA Intravenous access site secured and place NA Valuables secured Linens and cotton and cotton/polyester blend (less than 51% polyester) Personal oil-based products / skin lotions / body lotions removed Wigs or hairpieces removed NA Smoking or tobacco materials removed Books / newspapers / magazines / loose paper removed Cologne, aftershave, perfume and deodorant removed Jewelry removed (may wrap wedding band) NA Make-up removed NA Hair care products removed NA Battery operated devices (external) removed Heating patches and chemical warmers removed Titanium eyewear removed NA Nail polish cured greater than 10 hours Casting material cured greater than 10 hours NA Hearing aids removed NA Loose dentures or partials removed NA Prosthetics have  been removed NA Patient demonstrates correct use of air break device (if applicable) Patient concerns have been addressed Patient grounding bracelet on and cord attached to chamber Specifics for Inpatients (complete in addition to above) Medication sheet sent with patient NA Intravenous medications needed or due  during therapy sent with patient NA Drainage tubes (e.g. nasogastric tube or chest tube secured and vented) NA Endotracheal or Tracheotomy tube secured NA Cuff deflated of air and inflated with saline NA Airway suctioned NA Notes Information entered from paper intake sheet. Electronic Signature(s) Signed: 07/11/2021 10:43:56 AM By: [name] EMT Entered By: [name] on 07/11/2021 10:43:56

## 2021-07-12 ENCOUNTER — Encounter (HOSPITAL_BASED_OUTPATIENT_CLINIC_OR_DEPARTMENT_OTHER): Payer: Medicare Other | Admitting: Internal Medicine

## 2021-07-12 DIAGNOSIS — N3040 Irradiation cystitis without hematuria: Secondary | ICD-10-CM | POA: Diagnosis not present

## 2021-07-12 DIAGNOSIS — R300 Dysuria: Secondary | ICD-10-CM | POA: Diagnosis not present

## 2021-07-12 DIAGNOSIS — L598 Other specified disorders of the skin and subcutaneous tissue related to radiation: Secondary | ICD-10-CM | POA: Diagnosis not present

## 2021-07-12 NOTE — Progress Notes (Addendum)
CONWAY, FEDORA ([id number]) Visit Report for 07/12/2021 Arrival Information Details Patient Name: Date of Service: [name] 07/12/2021 7:30 A M Medical Record Number: [id number] Patient Account Number: [id number] Date of Birth/Sex: Treating RN: Nov 08, 1950 (71 y.o. Lorette Ang, Meta.Reding Primary Care Hildagard Sobecki: [name] Other Clinician: [name] Referring Brittne Kawasaki: Treating Alantra Popoca/Extender: [name] in Treatment: 8 Visit Information History Since Last Visit All ordered tests and consults were completed: Yes Patient Arrived: Ambulatory Added or deleted any medications: No Arrival Time: 07:35 Any new allergies or adverse reactions: No Accompanied By: None Had a fall or experienced change in No Transfer Assistance: None activities of daily living that may affect Patient Identification Verified: Yes risk of falls: Secondary Verification Process Completed: Yes Signs or symptoms of abuse/neglect since last visito No Patient Requires Transmission-Based Precautions: No Hospitalized since last visit: No Patient Has Alerts: No Implantable device outside of the clinic excluding No cellular tissue based products placed in the center since last visit: Pain Present Now: No Notes Information entered from paper intake sheet. Electronic Signature(s) Signed: 07/12/2021 7:59:14 AM By: [name] EMT Entered By: [name] on 07/12/2021 07:59:14 -------------------------------------------------------------------------------- Encounter Discharge Information Details Patient Name: Date of Service: [name] 07/12/2021 7:30 [place] Record Number: [id number] Patient Account Number: [id number] Date of Birth/Sex: Treating RN: October 01, 1950 (71 y.o. [name] Primary Care Maiya Kates: [name] Other Clinician: [name] Referring Nicosha Struve: Treating Latanja Lehenbauer/Extender: [name] in Treatment:  8 Encounter Discharge Information Items Discharge Condition: Stable Ambulatory Status: Ambulatory Discharge Destination: Home Transportation: Private Auto Accompanied By: None Schedule Follow-up Appointment: Yes Clinical Summary of Care: Electronic Signature(s) Signed: 07/12/2021 10:18:33 AM By: [name] EMT Entered By: [name] on 07/12/2021 10:18:33 -------------------------------------------------------------------------------- Patient/Caregiver Education Details Patient Name: Date of Service: [name] 2/2/2023andnbsp7:30 [place] Record Number: [id number] Patient Account Number: [id number] Date of Birth/Gender: Treating RN: [birth year] (71 y.o. [name] Primary Care Physician: [name] Other Clinician: [name] Referring Physician: Treating Physician/Extender: [name] in Treatment: 8 Education Assessment Education Provided To: Patient Education Topics Provided Electronic Signature(s) Signed: 07/12/2021 10:53:39 AM By: [name] EMT Entered By: [name] on 07/12/2021 10:18:09 -------------------------------------------------------------------------------- Vitals Details Patient Name: Date of Service: [name] 07/12/2021 7:30 A M Medical Record Number: [id number] Patient Account Number: [id number] Date of Birth/Sex: Treating RN: Nov 02, 1950 (71 y.o. [name] Primary Care Whitley Strycharz: [name] Other Clinician: [name] Referring Annalisa Colonna: Treating Xzaria Teo/Extender: [name] in Treatment: 8 Vital Signs Time Taken: 07:53 Temperature (F): 97.6 Height (in): 74 Pulse (bpm): 81 Weight (lbs): 315 Respiratory Rate (breaths/min): 16 Body Mass Index (BMI): 40.4 Blood Pressure (mmHg): 108/69 Reference Range: 80 - 120 mg / dl Notes Information entered from paper intake sheet. Electronic Signature(s) Signed: 07/12/2021 7:59:54 AM  By: [name] EMT Entered By: [name] on 07/12/2021 07:59:54

## 2021-07-12 NOTE — Progress Notes (Addendum)
DEARIUS, HOFFMANN ([id number]) Visit Report for 07/12/2021 HBO Details Patient Name: Date of Service: [name] 07/12/2021 7:30 A M Medical Record Number: [id number] Patient Account Number: [id number] Date of Birth/Sex: Treating RN: 09-10-1950 (71 y.o. Lorette Ang, Meta.Reding Primary Care Flannery Cavallero: [name] Other Clinician: [name] Referring Marka Treloar: Treating Calayah Guadarrama/Extender: [name] in Treatment: 8 HBO Treatment Course Details Treatment Course Number: 1 Ordering Samuell Knoble: [name] T Treatments Ordered: otal 40 HBO Treatment Start Date: 05/29/2021 HBO Indication: Late Effect of Radiation HBO Treatment Details Treatment Number: 26 Patient Type: Outpatient Chamber Type: Monoplace Chamber Serial #: [id number] Treatment Protocol: 2.0 ATA with 90 minutes oxygen, and no air breaks Treatment Details Compression Rate Down: 1.0 psi / minute De-Compression Rate Up: 1.0 psi / minute Air breaks and breathing Decompress Decompress Compress Tx Pressure Begins Reached periods Begins Ends (leave unused spaces blank) Chamber Pressure (ATA 1 2 ------2 1 ) Clock Time (24 hr) 07:53 08:11 - - - - - - 09:41 09:56 Treatment Length: 123 (minutes) Treatment Segments: 4 Vital Signs Capillary Blood Glucose Reference Range: 80 - 120 mg / dl HBO Diabetic Blood Glucose Intervention Range: <131 mg/dl or >249 mg/dl Time Vitals Blood Respiratory Capillary Blood Glucose Pulse Action Type: Pulse: Temperature: Taken: Pressure: Rate: Glucose (mg/dl): Meter #: Oximetry (%) Taken: Pre 07:53 108/69 81 16 97.6 Post 09:59 107/81 69 16 98 Treatment Response Treatment Toleration: Well Treatment Completion Status: Treatment Completed without Adverse Event Treatment Notes Information entered from paper intake sheet. The patient did well today, he had no problem clearing his ears. The patient slept most of his treatment today. Trayon Krantz Notes No  concerns with treatment given Physician HBO Attestation: I certify that I supervised this HBO treatment in accordance with Medicare guidelines. A trained emergency response team is readily available per Yes hospital policies and procedures. Continue HBOT as ordered. Yes Electronic Signature(s) Signed: 07/12/2021 4:50:25 PM By: Linton Ham MD Previous Signature: 07/12/2021 10:17:12 AM Version By: [name] EMT Previous Signature: 07/12/2021 10:17:12 AM Version By: [name] EMT Entered By: Linton Ham on 07/12/2021 16:37:09 -------------------------------------------------------------------------------- HBO Safety Checklist Details Patient Name: Date of Service: [name] 07/12/2021 7:30 A M Medical Record Number: [id number] Patient Account Number: [id number] Date of Birth/Sex: Treating RN: 04/16/1951 (71 y.o. Lorette Ang, Meta.Reding Primary Care Lochlyn Zullo: [name] Other Clinician: [name] Referring Arika Mainer: Treating Leshia Kope/Extender: [name] in Treatment: 8 HBO Safety Checklist Items Safety Checklist Consent Form Signed Patient voided / foley secured and emptied When did you last eato 0600 Last dose of injectable or oral agent NA Ostomy pouch emptied and vented if applicable NA All implantable devices assessed, documented and approved NA Intravenous access site secured and place NA Valuables secured Linens and cotton and cotton/polyester blend (less than 51% polyester) Personal oil-based products / skin lotions / body lotions removed Wigs or hairpieces removed NA Smoking or tobacco materials removed Books / newspapers / magazines / loose paper removed Cologne, aftershave, perfume and deodorant removed Jewelry removed (may wrap wedding band) NA Make-up removed NA Hair care products removed Battery operated devices (external) removed Heating patches and chemical warmers removed Titanium eyewear  removed NA Nail polish cured greater than 10 hours Casting material cured greater than 10 hours NA Hearing aids removed NA Loose dentures or partials removed NA Prosthetics have been removed NA Patient demonstrates correct use of air break device (if applicable) Patient concerns have been addressed Patient grounding bracelet on and  cord attached to chamber Specifics for Inpatients (complete in addition to above) Medication sheet sent with patient NA Intravenous medications needed or due during therapy sent with patient NA Drainage tubes (e.g. nasogastric tube or chest tube secured and vented) NA Endotracheal or Tracheotomy tube secured NA Cuff deflated of air and inflated with saline NA Airway suctioned NA Notes Information entered from paper intake sheet. Electronic Signature(s) Signed: 07/12/2021 8:01:41 AM By: [name] EMT Entered By: [name] on 07/12/2021 08:01:40

## 2021-07-12 NOTE — Progress Notes (Signed)
TAELON, BENDORF ([id number]) Visit Report for 07/12/2021 Problem List Details Patient Name: Date of Service: [name] [name] 07/12/2021 7:30 A M Medical Record Number: [id number] Patient Account Number: [id number] Date of Birth/Sex: Treating RN: 1950-12-28 (71 y.o. 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 05/15/2021 No Yes C61 Malignant neoplasm of prostate 05/15/2021 No Yes R30.0 Dysuria 05/15/2021 No Yes Z00.00 Encounter for general adult medical examination without abnormal findings 05/15/2021 No Yes Inactive Problems Resolved Problems Electronic Signature(s) Signed: 07/12/2021 10:17:48 AM By: [name] EMT Signed: 07/12/2021 4:50:25 PM By: Linton Ham MD Entered By: [name] on 07/12/2021 10:17:47 -------------------------------------------------------------------------------- SuperBill Details Patient Name: Date of Service: [name] [name] 07/12/2021 Medical Record Number: [id number] Patient Account Number: [id number] Date of Birth/Sex: Treating RN: [birth year] (71 y.o. 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 C61 Malignant neoplasm of  prostate R30.0 Dysuria Z00.00 Encounter for general adult medical examination  without abnormal findings Modifier: Quantity: 4 Physician Procedures : CPT4 Code Description Modifier [id number] - WC PHYS HYPERBARIC OXYGEN THERAPY ICD-10 Diagnosis Description 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

## 2021-07-13 ENCOUNTER — Other Ambulatory Visit: Payer: Self-pay

## 2021-07-13 ENCOUNTER — Encounter (HOSPITAL_BASED_OUTPATIENT_CLINIC_OR_DEPARTMENT_OTHER): Payer: Medicare Other | Admitting: Internal Medicine

## 2021-07-13 DIAGNOSIS — R300 Dysuria: Secondary | ICD-10-CM | POA: Diagnosis not present

## 2021-07-13 DIAGNOSIS — C61 Malignant neoplasm of prostate: Secondary | ICD-10-CM

## 2021-07-13 DIAGNOSIS — N3040 Irradiation cystitis without hematuria: Secondary | ICD-10-CM | POA: Diagnosis not present

## 2021-07-13 NOTE — Progress Notes (Addendum)
[name], [name] ([id number]) Visit Report for 07/13/2021 Arrival Information Details Patient Name: Date of Service: [name] [name] 07/13/2021 7:30 [place] Record Number: [id number] Patient Account Number: [id number] Date of Birth/Sex: Treating RN: 1951-02-17 (71 y.o. [name] [name] Primary Care [name] [name]: [name] [name] Other Clinician: Valeria [name] Referring [name] [name]: Treating [name] [name]/Extender: [name] [name] in Treatment: 8 Visit Information History Since Last Visit All ordered tests and consults were completed: Yes Patient Arrived: Ambulatory Added or deleted any medications: No Arrival Time: 07:35 Any new allergies or adverse reactions: No Accompanied By: None Had a fall or experienced change in No Transfer Assistance: None activities of daily living that may affect Patient Identification Verified: Yes risk of falls: Secondary Verification Process Completed: Yes Signs or symptoms of abuse/neglect since last visito No Patient Requires Transmission-Based Precautions: No Hospitalized since last visit: No Patient Has Alerts: No Implantable device outside of the clinic excluding No cellular tissue based products placed in the center since last visit: Pain Present Now: No Notes Information entered from paper intake sheet. Electronic Signature(s) Signed: 07/13/2021 11:18:57 AM By: [name] [name] EMT Previous Signature: 07/13/2021 11:13:13 AM Version By: [name] [name] EMT Entered By: [name] [name] on 07/13/2021 11:18:56 -------------------------------------------------------------------------------- Encounter Discharge Information Details Patient Name: Date of Service: [name] [name] 07/13/2021 7:30 [place] Record Number: [id number] Patient Account Number: [id number] Date of Birth/Sex: Treating RN: 1950/08/03 (71 y.o. [name] [name] Primary Care [name] [name]: [name] [name] Other Clinician: Valeria [name] Referring  [name] [name]: Treating [name] [name]/Extender: [name] [name] in Treatment: 8 Encounter Discharge Information Items Discharge Condition: Stable Ambulatory Status: Ambulatory Discharge Destination: Home Transportation: Private Auto Accompanied By: None Schedule Follow-up Appointment: Yes Clinical Summary of Care: Electronic Signature(s) Signed: 07/13/2021 11:21:28 AM By: [name] [name] EMT Entered By: [name] [name] on 07/13/2021 11:21:27 -------------------------------------------------------------------------------- Patient/Caregiver Education Details Patient Name: Date of Service: [name] [name] 2/3/2023andnbsp7:30 [place] Record Number: [id number] Patient Account Number: [id number] Date of Birth/Gender: Treating RN: September 25, 1950 (71 y.o. [name] [name] Primary Care Physician: [name] [name] Other Clinician: Valeria [name] Referring Physician: Treating Physician/Extender: [name] [name] in Treatment: 8 Education Assessment Education Provided To: Patient Education Topics Provided Electronic Signature(s) Signed: 07/13/2021 12:34:05 PM By: [name] [name] EMT Entered By: [name] [name] on 07/13/2021 11:21:07 -------------------------------------------------------------------------------- Vitals Details Patient Name: Date of Service: [name] [name] 07/13/2021 7:30 A M Medical Record Number: [id number] Patient Account Number: [id number] Date of Birth/Sex: Treating RN: 18-Nov-1950 (71 y.o. [name] [name] Primary Care [name] [name]: [name] [name] Other Clinician: Valeria [name] Referring [name] [name]: Treating [name] [name]/Extender: [name] [name] Weeks in Treatment: 8 Vital Signs Time Taken: 07:58 Temperature (F): 97.7 Height (in): 74 Pulse (bpm): 77 Weight (lbs): 315 Respiratory Rate (breaths/min): 16 Body Mass Index (BMI): 40.4 Blood Pressure (mmHg): 104/62 Reference Range: 80 - 120 mg /  dl Notes Information entered from paper intake sheet. Electronic Signature(s) Signed: 07/13/2021 11:19:11 AM By: [name] [name] EMT Previous Signature: 07/13/2021 11:14:02 AM Version By: [name] [name] EMT Entered By: [name] [name] on 07/13/2021 11:19:10

## 2021-07-13 NOTE — Progress Notes (Signed)
SHAI, MCKENZIE ([id number]) Visit Report for 07/13/2021 Problem List Details Patient Name: Date of Service: [name] [name] 07/13/2021 7:30 A M Medical Record Number: [id number] Patient Account Number: [id number] Date of Birth/Sex: Treating RN: [birth year] (71 y.o. 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 05/15/2021 No Yes C61 Malignant neoplasm of prostate 05/15/2021 No Yes R30.0 Dysuria 05/15/2021 No Yes Z00.00 Encounter for general adult medical examination without abnormal findings 05/15/2021 No Yes Inactive Problems Resolved Problems Electronic Signature(s) Signed: 07/13/2021 11:20:12 AM By: [name] EMT Signed: 07/13/2021 12:29:41 PM By: [name] DO Entered By: [name] on 07/13/2021 11:20:12 -------------------------------------------------------------------------------- [place] Details Patient Name: Date of Service: [name] [name] 07/13/2021 Medical Record Number: [id number] Patient Account Number: [id number] Date of Birth/Sex: Treating RN: [birth year] (71 y.o. 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 C61 Malignant neoplasm  of prostate R30.0 Dysuria Z00.00 Encounter for general adult medical examination  without abnormal findings Modifier: Quantity: 4 Physician Procedures : CPT4 Code Description Modifier [id number] - WC PHYS HYPERBARIC OXYGEN THERAPY ICD-10 Diagnosis Description 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

## 2021-07-13 NOTE — Progress Notes (Addendum)
[name], [name] ([id number]) Visit Report for 07/13/2021 HBO Details Patient Name: Date of Service: [name] [name] 07/13/2021 7:30 A M Medical Record Number: [id number] Patient Account Number: [id number] Date of Birth/Sex: Treating RN: 03-02-1951 (71 y.o. [name] Primary Care Ruvi Fullenwider: [name] Other Clinician: [name] Referring Jai Bear: Treating Aquila Delaughter/Extender: [name]: 8 HBO Treatment Course Details Treatment Course Number: 1 Ordering Aitanna Haubner: [name] T Treatments Ordered: otal 40 HBO Treatment Start Date: 05/29/2021 HBO Indication: Late Effect of Radiation HBO Treatment Details Treatment Number: 27 Patient Type: Outpatient Chamber Type: Monoplace Chamber Serial #: [id number] Treatment Protocol: 2.0 ATA with 90 minutes oxygen, and no air breaks Treatment Details Compression Rate Down: 1.0 psi / minute De-Compression Rate Up: 1.0 psi / minute Air breaks and breathing Decompress Decompress Compress Tx Pressure Begins Reached periods Begins Ends (leave unused spaces blank) Chamber Pressure (ATA 1 2 ------2 1 ) Clock Time (24 hr) 08:20 08:38 - - - - - - 10:08 10:24 Treatment Length: 124 (minutes) Treatment Segments: 4 Vital Signs Capillary Blood Glucose Reference Range: 80 - 120 mg / dl HBO Diabetic Blood Glucose Intervention Range: <131 mg/dl or >249 mg/dl Time Vitals Blood Respiratory Capillary Blood Glucose Pulse Action Type: Pulse: Temperature: Taken: Pressure: Rate: Glucose (mg/dl): Meter #: Oximetry (%) Taken: Pre 07:58 104/62 77 16 97.7 Post 10:26 114/82 62 16 98.2 Treatment Response Treatment Toleration: Well Treatment Completion Status: Treatment Completed without Adverse Event Treatment Notes Information entered from paper intake sheet. The patient did well today, he had no problem clearing his ears. The patient slept most of his treatment today. Physician HBO  Attestation: I certify that I supervised this HBO treatment in accordance with Medicare guidelines. A trained emergency response team is readily available per Yes hospital policies and procedures. Continue HBOT as ordered. Yes Electronic Signature(s) Signed: 07/13/2021 12:29:41 PM By: [name] DO Previous Signature: 07/13/2021 11:18:35 AM Version By: [name] EMT Entered By: [name] on 07/13/2021 12:23:31 -------------------------------------------------------------------------------- HBO Safety Checklist Details Patient Name: Date of Service: [name] [name] 07/13/2021 7:30 A M Medical Record Number: [id number] Patient Account Number: [id number] Date of Birth/Sex: Treating RN: 1951/05/12 (71 y.o. [name] Primary Care Keen Ewalt: [name] Other Clinician: [name] Referring Xiao Graul: Treating Ledarrius Beauchaine/Extender: [name]: 8 HBO Safety Checklist Items Safety Checklist Consent Form Signed Patient voided / foley secured and emptied When did you last eato 0700 Last dose of injectable or oral agent NA Ostomy pouch emptied and vented if applicable NA All implantable devices assessed, documented and approved NA Intravenous access site secured and place NA Valuables secured Linens and cotton and cotton/polyester blend (less than 51% polyester) Personal oil-based products / skin lotions / body lotions removed Wigs or hairpieces removed NA Smoking or tobacco materials removed Books / newspapers / magazines / loose paper removed Cologne, aftershave, perfume and deodorant removed Jewelry removed (may wrap wedding band) NA Make-up removed NA Hair care products removed Battery operated devices (external) removed Heating patches and chemical warmers removed Titanium eyewear removed NA Nail polish cured greater than 10 hours Casting material cured greater than 10 hours NA Hearing aids  removed NA Loose dentures or partials removed NA Prosthetics have been removed NA Patient demonstrates correct use of air break device (if applicable) Patient concerns have been addressed Patient grounding bracelet on and cord attached to chamber Specifics for Inpatients (complete in addition to above) Medication sheet sent with patient  NA Intravenous medications needed or due during therapy sent with patient NA Drainage tubes (e.g. nasogastric tube or chest tube secured and vented) NA Endotracheal or Tracheotomy tube secured NA Cuff deflated of air and inflated with saline NA Airway suctioned NA Notes Information entered from paper intake sheet. Electronic Signature(s) Signed: 07/13/2021 11:19:25 AM By: [name] EMT Previous Signature: 07/13/2021 11:16:59 AM Version By: [name] EMT Entered By: [name] on 07/13/2021 11:19:25

## 2021-07-16 ENCOUNTER — Other Ambulatory Visit: Payer: Self-pay

## 2021-07-16 ENCOUNTER — Encounter (HOSPITAL_BASED_OUTPATIENT_CLINIC_OR_DEPARTMENT_OTHER): Payer: Medicare Other | Admitting: Internal Medicine

## 2021-07-16 DIAGNOSIS — R300 Dysuria: Secondary | ICD-10-CM | POA: Diagnosis not present

## 2021-07-16 DIAGNOSIS — N3040 Irradiation cystitis without hematuria: Secondary | ICD-10-CM

## 2021-07-16 DIAGNOSIS — C61 Malignant neoplasm of prostate: Secondary | ICD-10-CM | POA: Diagnosis not present

## 2021-07-16 NOTE — Progress Notes (Signed)
ASAPH, SERENA ([id number]) Visit Report for 07/16/2021 Problem List Details Patient Name: Date of Service: [name] [name] 07/16/2021 7:30 A M Medical Record Number: [id number] Patient Account Number: [id number] Date of Birth/Sex: Treating RN: 1950/08/23 (71 y.o. 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 05/15/2021 No Yes C61 Malignant neoplasm of prostate 05/15/2021 No Yes R30.0 Dysuria 05/15/2021 No Yes Z00.00 Encounter for general adult medical examination without abnormal findings 05/15/2021 No Yes Inactive Problems Resolved Problems Electronic Signature(s) Signed: 07/16/2021 2:11:50 PM By: [name] EMT Signed: 07/16/2021 3:53:57 PM By: [name] DO Entered By: [name] on 07/16/2021 14:11:50 -------------------------------------------------------------------------------- SuperBill Details Patient Name: Date of Service: [name] [name] 07/16/2021 Medical Record Number: [id number] Patient Account Number: [id number] Date of Birth/Sex: Treating RN: 08-28-1950 (71 y.o. 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 C61 Malignant neoplasm of  prostate R30.0 Dysuria Z00.00 Encounter for general adult medical examination  without abnormal findings Modifier: Quantity: 4 Physician Procedures : CPT4 Code Description Modifier [id number] - WC PHYS HYPERBARIC OXYGEN THERAPY ICD-10 Diagnosis Description 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

## 2021-07-16 NOTE — Progress Notes (Addendum)
MARIO, [name] [name] ([id number]) Visit Report for 07/16/2021 Arrival Information Details Patient Name: Date of Service: [name] [name] 07/16/2021 7:30 A M Medical Record Number: [id number] Patient Account Number: [id number] Date of Birth/Sex: Treating RN: 11-Apr-1951 (71 y.o. [name] [name] Primary Care Char Feltman: [name] Other Clinician: [name] Referring Jayanth Szczesniak: Treating Surie Suchocki/Extender: [name]: 8 Visit Information History Since Last Visit All ordered tests and consults were completed: Yes Patient Arrived: Ambulatory Added or deleted any medications: No Arrival Time: 07:53 Any new allergies or adverse reactions: No Accompanied By: none Had a fall or experienced change in No Transfer Assistance: None activities of daily living that may affect Patient Identification Verified: Yes risk of falls: Secondary Verification Process Completed: Yes Signs or symptoms of abuse/neglect since last visito No Patient Requires Transmission-Based Precautions: No Hospitalized since last visit: No Patient Has Alerts: No Implantable device outside of the clinic excluding No cellular tissue based products placed in the center since last visit: Pain Present Now: No Notes Paper version used prior to treatment start. Electronic Signature(s) Signed: 07/16/2021 2:07:21 PM By: [name] EMT Entered By: [name] on 07/16/2021 14:07:21 -------------------------------------------------------------------------------- Encounter Discharge Information Details Patient Name: Date of Service: [name] [name] 07/16/2021 7:30 A M Medical Record Number: [id number] Patient Account Number: [id number] Date of Birth/Sex: Treating RN: [birth year] (71 y.o. [name] [name] Primary Care Pate Aylward: [name] Other Clinician: [name] Referring Emersyn Wyss: Treating Melvin Marmo/Extender: [name] Weeks in  Treatment: 8 Encounter Discharge Information Items Discharge Condition: Stable Ambulatory Status: Ambulatory Discharge Destination: Home Transportation: Private Auto Accompanied By: None Schedule Follow-up Appointment: Yes Clinical Summary of Care: Electronic Signature(s) Signed: 07/16/2021 2:12:27 PM By: [name] EMT Entered By: [name] on 07/16/2021 14:12:27 -------------------------------------------------------------------------------- Patient/Caregiver Education Details Patient Name: Date of Service: [name] [name] 2/6/2023andnbsp7:30 [place] Record Number: [id number] Patient Account Number: [id number] Date of Birth/Gender: Treating RN: August 21, 1950 (71 y.o. [name] [name] Primary Care Physician: [name] Other Clinician: [name] Referring Physician: Treating Physician/Extender: [name] in Treatment: 8 Education Assessment Education Provided To: Patient Education Topics Provided Electronic Signature(s) Signed: 07/16/2021 4:05:09 PM By: [name] EMT Entered By: [name] on 07/16/2021 14:12:09 -------------------------------------------------------------------------------- Vitals Details Patient Name: Date of Service: [name] [name] 07/16/2021 7:30 A M Medical Record Number: [id number] Patient Account Number: [id number] Date of Birth/Sex: Treating RN: 08-Aug-1950 (71 y.o. [name] [name] Primary Care Michala Deblanc: [name] Other Clinician: [name] Referring Iraida Cragin: Treating Lovie Zarling/Extender: [name]: 8 Vital Signs Time Taken: 08:06 Temperature (F): 97.7 Height (in): 74 Pulse (bpm): 74 Weight (lbs): 315 Respiratory Rate (breaths/min): 16 Body Mass Index (BMI): 40.4 Blood Pressure (mmHg): 110/76 Reference Range: 80 - 120 mg / dl Notes Paper version used prior to treatment start. Electronic Signature(s) Signed: 07/16/2021  2:07:51 PM By: [name] EMT Entered By: [name] on 07/16/2021 14:07:51

## 2021-07-16 NOTE — Progress Notes (Addendum)
KELII, CHITTUM ([id number]) Visit Report for 07/16/2021 HBO Details Patient Name: Date of Service: [name] 07/16/2021 7:30 A M Medical Record Number: [id number] Patient Account Number: [id number] Date of Birth/Sex: Treating RN: [birth year] (71 y.o. [name] Primary Care Enrrique Mierzwa: [name] Other Clinician: [name] Referring Melchizedek Espinola: Treating Dante Roudebush/Extender: [name]: 8 HBO Treatment Course Details Treatment Course Number: 1 Ordering Pleshette Tomasini: [name] T Treatments Ordered: otal 40 HBO Treatment Start Date: 05/29/2021 HBO Indication: Late Effect of Radiation HBO Treatment Details Treatment Number: 28 Patient Type: Outpatient Chamber Type: Monoplace Chamber Serial #: [id number] Treatment Protocol: 2.0 ATA with 90 minutes oxygen, and no air breaks Treatment Details Compression Rate Down: 1.0 psi / minute De-Compression Rate Up: 1.0 psi / minute Air breaks and breathing Decompress Decompress Compress Tx Pressure Begins Reached periods Begins Ends (leave unused spaces blank) Chamber Pressure (ATA 1 2 ------2 1 ) Clock Time (24 hr) 08:39 08:57 - - - - - - 10:27 10:43 Treatment Length: 124 (minutes) Treatment Segments: 4 Vital Signs Capillary Blood Glucose Reference Range: 80 - 120 mg / dl HBO Diabetic Blood Glucose Intervention Range: <131 mg/dl or >249 mg/dl Time Vitals Blood Respiratory Capillary Blood Glucose Pulse Action Type: Pulse: Temperature: Taken: Pressure: Rate: Glucose (mg/dl): Meter #: Oximetry (%) Taken: Pre 08:06 110/76 74 16 97.7 Post 10:45 114/78 66 16 97.5 Treatment Response Treatment Toleration: Well Treatment Completion Status: Treatment Completed without Adverse Event Treatment Notes Paper version used prior to treatment start. The patient did well today clearing his ear. Patient fell asleep during treatment. Physician HBO Attestation: I certify that I  supervised this HBO treatment in accordance with Medicare guidelines. A trained emergency response team is readily available per Yes hospital policies and procedures. Continue HBOT as ordered. Yes Electronic Signature(s) Signed: 07/16/2021 3:57:27 PM By: [name] DO Previous Signature: 07/16/2021 2:11:16 PM Version By: [name] EMT Previous Signature: 07/16/2021 3:53:57 PM Version By: [name] DO Entered By: [name] on 07/16/2021 15:54:20 -------------------------------------------------------------------------------- HBO Safety Checklist Details Patient Name: Date of Service: [name] 07/16/2021 7:30 A M Medical Record Number: [id number] Patient Account Number: [id number] Date of Birth/Sex: Treating RN: 1951/06/07 (71 y.o. [name] Primary Care Erron Wengert: [name] Other Clinician: [name] Referring Rilynn Habel: Treating Rosealee Recinos/Extender: [name]: 8 HBO Safety Checklist Items Safety Checklist Consent Form Signed Patient voided / foley secured and emptied When did you last eato 0630 Last dose of injectable or oral agent NA Ostomy pouch emptied and vented if applicable NA All implantable devices assessed, documented and approved NA Intravenous access site secured and place NA Valuables secured Linens and cotton and cotton/polyester blend (less than 51% polyester) Personal oil-based products / skin lotions / body lotions removed Wigs or hairpieces removed NA Smoking or tobacco materials removed Books / newspapers / magazines / loose paper removed Cologne, aftershave, perfume and deodorant removed Jewelry removed (may wrap wedding band) Make-up removed NA Hair care products removed NA Battery operated devices (external) removed Heating patches and chemical warmers removed Titanium eyewear removed NA Nail polish cured greater than 10 hours Casting material cured greater  than 10 hours NA Hearing aids removed NA Loose dentures or partials removed NA Prosthetics have been removed NA Patient demonstrates correct use of air break device (if applicable) Patient concerns have been addressed Patient grounding bracelet on and cord attached to chamber Specifics for Inpatients (complete in addition to above) Medication  sheet sent with patient NA Intravenous medications needed or due during therapy sent with patient NA Drainage tubes (e.g. nasogastric tube or chest tube secured and vented) NA Endotracheal or Tracheotomy tube secured NA Cuff deflated of air and inflated with saline Airway suctioned Notes Paper version used prior to treatment start. Electronic Signature(s) Signed: 07/16/2021 2:09:01 PM By: [name] EMT Entered By: [name] on 07/16/2021 14:09:00

## 2021-07-17 ENCOUNTER — Encounter (HOSPITAL_BASED_OUTPATIENT_CLINIC_OR_DEPARTMENT_OTHER): Payer: Medicare Other | Admitting: Internal Medicine

## 2021-07-17 DIAGNOSIS — R300 Dysuria: Secondary | ICD-10-CM | POA: Diagnosis not present

## 2021-07-17 DIAGNOSIS — N3040 Irradiation cystitis without hematuria: Secondary | ICD-10-CM | POA: Diagnosis not present

## 2021-07-17 DIAGNOSIS — L598 Other specified disorders of the skin and subcutaneous tissue related to radiation: Secondary | ICD-10-CM | POA: Diagnosis not present

## 2021-07-17 NOTE — Progress Notes (Addendum)
[name] [name] ([id number]) Visit Report for 07/17/2021 Arrival Information Details Patient Name: Date of Service: [name] [name] 07/17/2021 7:30 A M Medical Record Number: [id number] Patient Account Number: [id number] Date of Birth/Sex: Treating RN: 1951/03/21 (71 y.o. [name] Primary Care Mylissa Lambe: [name] Other Clinician: [name] Referring Cordie Buening: Treating Tovia Kisner/Extender: [name] in Treatment: 9 Visit Information History Since Last Visit All ordered tests and consults were completed: Yes Patient Arrived: Ambulatory Added or deleted any medications: No Arrival Time: 07:33 Any new allergies or adverse reactions: No Accompanied By: none Had a fall or experienced change in No Transfer Assistance: None activities of daily living that may affect Patient Requires Transmission-Based Precautions: No risk of falls: Patient Has Alerts: No Signs or symptoms of abuse/neglect since last visito No Hospitalized since last visit: No Implantable device outside of the clinic excluding No cellular tissue based products placed in the center since last visit: Pain Present Now: No Notes Paper version used prior to treatment start. Electronic Signature(s) Signed: 07/17/2021 1:58:25 PM By: [name] EMT Entered By: [name] on 07/17/2021 13:58:24 -------------------------------------------------------------------------------- Encounter Discharge Information Details Patient Name: Date of Service: [name] [name] 07/17/2021 7:30 A M Medical Record Number: [id number] Patient Account Number: [id number] Date of Birth/Sex: Treating RN: March 09, 1951 (71 y.o. [name] Primary Care Lakeisa Heninger: [name] Other Clinician: [name] Referring Cleon Thoma: Treating Kirstie Larsen/Extender: [name] in Treatment: 9 Encounter Discharge Information Items Discharge Condition: Stable Ambulatory  Status: Ambulatory Discharge Destination: Home Transportation: Private Auto Accompanied By: None Schedule Follow-up Appointment: Yes Clinical Summary of Care: Patient Declined Electronic Signature(s) Signed: 07/17/2021 2:04:10 PM By: [name] EMT Entered By: [name] on 07/17/2021 14:04:10 -------------------------------------------------------------------------------- Patient/Caregiver Education Details Patient Name: Date of Service: [name] [name] 2/7/2023andnbsp7:30 [place] Record Number: [id number] Patient Account Number: [id number] Date of Birth/Gender: Treating RN: Oct 08, 1950 (71 y.o. [name] Primary Care Physician: [name] Other Clinician: [name] Referring Physician: Treating Physician/Extender: [name] in Treatment: 9 Education Assessment Education Provided To: Patient Education Topics Provided Electronic Signature(s) Signed: 07/17/2021 4:49:40 PM By: [name] EMT Entered By: [name] on 07/17/2021 14:03:44 -------------------------------------------------------------------------------- Vitals Details Patient Name: Date of Service: [name] [name] 07/17/2021 7:30 A M Medical Record Number: [id number] Patient Account Number: [id number] Date of Birth/Sex: Treating RN: 12-Jan-1951 (71 y.o. [name] Primary Care Kendle Erker: [name] Other Clinician: [name] Referring Myer Bohlman: Treating Gavin Telford/Extender: [name] in Treatment: 9 Vital Signs Time Taken: 07:50 Temperature (F): 97.7 Height (in): 74 Pulse (bpm): 71 Weight (lbs): 315 Respiratory Rate (breaths/min): 16 Body Mass Index (BMI): 40.4 Blood Pressure (mmHg): 112/78 Reference Range: 80 - 120 mg / dl Notes Paper version used prior to treatment start. Electronic Signature(s) Signed: 07/17/2021 1:59:00 PM By: [name] EMT Entered By: [name] on 07/17/2021  13:59:00

## 2021-07-17 NOTE — Progress Notes (Signed)
CAMBREN, HELM ([id number]) Visit Report for 07/17/2021 Problem List Details Patient Name: Date of Service: [name] [name] 07/17/2021 7:30 A M Medical Record Number: [id number] Patient Account Number: [id number] Date of Birth/Sex: Treating RN: 1951-05-31 (71 y.o. 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 05/15/2021 No Yes C61 Malignant neoplasm of prostate 05/15/2021 No Yes R30.0 Dysuria 05/15/2021 No Yes Z00.00 Encounter for general adult medical examination without abnormal findings 05/15/2021 No Yes Inactive Problems Resolved Problems Electronic Signature(s) Signed: 07/17/2021 2:02:04 PM By: [name] EMT Signed: 07/17/2021 5:02:53 PM By: Linton Ham MD Entered By: [name] on 07/17/2021 14:02:04 -------------------------------------------------------------------------------- SuperBill Details Patient Name: Date of Service: [name] [name] 07/17/2021 Medical Record Number: [id number] Patient Account Number: [id number] Date of Birth/Sex: Treating RN: Jan 04, 1951 (71 y.o. 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 C61 Malignant neoplasm of  prostate R30.0 Dysuria Z00.00 Encounter for general adult medical examination  without abnormal findings Modifier: Quantity: 4 Physician Procedures : CPT4 Code Description Modifier [id number] - WC PHYS HYPERBARIC OXYGEN THERAPY ICD-10 Diagnosis Description 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

## 2021-07-17 NOTE — Progress Notes (Addendum)
HAARIS, METALLO ([id number]) Visit Report for 07/17/2021 HBO Details Patient Name: Date of Service: [name] [name] 07/17/2021 7:30 A M Medical Record Number: [id number] Patient Account Number: [id number] Date of Birth/Sex: Treating RN: [birth year] (71 y.o. [name] Primary Care Chanti Golubski: [name] Other Clinician: [name] Referring Riddhi Grether: Treating Shilpa Bushee/Extender: [name] in Treatment: 9 HBO Treatment Course Details Treatment Course Number: 1 Ordering Lucynda Rosano: [name] T Treatments Ordered: otal 40 HBO Treatment Start Date: 05/29/2021 HBO Indication: Late Effect of Radiation HBO Treatment Details Treatment Number: 29 Patient Type: Outpatient Chamber Type: Monoplace Chamber Serial #: [id number] Treatment Protocol: 2.0 ATA with 90 minutes oxygen, and no air breaks Treatment Details Compression Rate Down: 1.0 psi / minute De-Compression Rate Up: 1.0 psi / minute Air breaks and breathing Decompress Decompress Compress Tx Pressure Begins Reached periods Begins Ends (leave unused spaces blank) Chamber Pressure (ATA 1 2 ------2 1 ) Clock Time (24 hr) 07:55 08:12 - - - - - - 09:42 09:57 Treatment Length: 122 (minutes) Treatment Segments: 4 Vital Signs Capillary Blood Glucose Reference Range: 80 - 120 mg / dl HBO Diabetic Blood Glucose Intervention Range: <131 mg/dl or >249 mg/dl Time Vitals Blood Respiratory Capillary Blood Glucose Pulse Action Type: Pulse: Temperature: Taken: Pressure: Rate: Glucose (mg/dl): Meter #: Oximetry (%) Taken: Pre 07:50 112/78 71 16 97.7 Post 10:00 122/90 69 16 98.1 Treatment Response Treatment Toleration: Well Treatment Completion Status: Treatment Completed without Adverse Event Treatment Notes Paper version used prior to treatment start. The patient did well today clearing his ear. Patient fell asleep during treatment Tytan Sandate Notes No concerns with treatment  given Physician HBO Attestation: I certify that I supervised this HBO treatment in accordance with Medicare guidelines. A trained emergency response team is readily available per Yes hospital policies and procedures. Continue HBOT as ordered. Yes Electronic Signature(s) Signed: 07/17/2021 5:02:53 PM By: Linton Ham MD Previous Signature: 07/17/2021 2:01:39 PM Version By: [name] EMT Entered By: Linton Ham on 07/17/2021 16:58:27 -------------------------------------------------------------------------------- HBO Safety Checklist Details Patient Name: Date of Service: [name] [name] 07/17/2021 7:30 A M Medical Record Number: [id number] Patient Account Number: [id number] Date of Birth/Sex: Treating RN: Nov 22, 1950 (71 y.o. [name] Primary Care Gianny Sabino: [name] Other Clinician: [name] Referring Ridge Lafond: Treating Daizy Outen/Extender: [name] in Treatment: 9 HBO Safety Checklist Items Safety Checklist Consent Form Signed Patient voided / foley secured and emptied When did you last eato 0600 Last dose of injectable or oral agent NA Ostomy pouch emptied and vented if applicable NA All implantable devices assessed, documented and approved NA Intravenous access site secured and place NA Valuables secured Linens and cotton and cotton/polyester blend (less than 51% polyester) Personal oil-based products / skin lotions / body lotions removed Wigs or hairpieces removed NA Smoking or tobacco materials removed Books / newspapers / magazines / loose paper removed Cologne, aftershave, perfume and deodorant removed Jewelry removed (may wrap wedding band) NA Make-up removed NA Hair care products removed Battery operated devices (external) removed Heating patches and chemical warmers removed Titanium eyewear removed NA Nail polish cured greater than 10 hours Casting material cured greater than 10 hours NA Hearing  aids removed NA Loose dentures or partials removed NA Prosthetics have been removed NA Patient demonstrates correct use of air break device (if applicable) Patient concerns have been addressed Patient grounding bracelet on and cord attached to chamber Specifics for Inpatients (complete in addition to above) Medication sheet sent with  patient NA Intravenous medications needed or due during therapy sent with patient NA Drainage tubes (e.g. nasogastric tube or chest tube secured and vented) NA Endotracheal or Tracheotomy tube secured NA Cuff deflated of air and inflated with saline NA Airway suctioned NA Notes Paper version used prior to treatment start. Electronic Signature(s) Signed: 07/17/2021 2:00:09 PM By: [name] EMT Entered By: [name] on 07/17/2021 14:00:09

## 2021-07-18 ENCOUNTER — Other Ambulatory Visit: Payer: Self-pay

## 2021-07-18 ENCOUNTER — Encounter (HOSPITAL_BASED_OUTPATIENT_CLINIC_OR_DEPARTMENT_OTHER): Payer: Medicare Other | Admitting: Physician Assistant

## 2021-07-18 DIAGNOSIS — L598 Other specified disorders of the skin and subcutaneous tissue related to radiation: Secondary | ICD-10-CM | POA: Diagnosis not present

## 2021-07-18 DIAGNOSIS — R300 Dysuria: Secondary | ICD-10-CM | POA: Diagnosis not present

## 2021-07-18 DIAGNOSIS — N3040 Irradiation cystitis without hematuria: Secondary | ICD-10-CM | POA: Diagnosis not present

## 2021-07-18 NOTE — Progress Notes (Signed)
NAS, WAFER ([id number]) Visit Report for 07/18/2021 Problem List Details Patient Name: Date of Service: [name] [name] 07/18/2021 7:30 A M Medical Record Number: [id number] Patient Account Number: [id number] Date of Birth/Sex: Treating RN: 1950/08/24 (71 y.o. 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 05/15/2021 No Yes C61 Malignant neoplasm of prostate 05/15/2021 No Yes R30.0 Dysuria 05/15/2021 No Yes Z00.00 Encounter for general adult medical examination without abnormal findings 05/15/2021 No Yes Inactive Problems Resolved Problems Electronic Signature(s) Signed: 07/18/2021 2:39:36 PM By: [name] EMT Signed: 07/18/2021 5:11:08 PM By: Worthy Keeler PA-C Entered By: [name] on 07/18/2021 14:39:35 -------------------------------------------------------------------------------- SuperBill Details Patient Name: Date of Service: [name] [name] 07/18/2021 Medical Record Number: [id number] Patient Account Number: [id number] Date of Birth/Sex: Treating RN: 1951/05/06 (71 y.o. 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 C61 Malignant neoplasm of  prostate R30.0 Dysuria Z00.00 Encounter for general adult medical examination  without abnormal findings Modifier: Quantity: 4 Physician Procedures : CPT4 Code Description Modifier [id number] - WC PHYS HYPERBARIC OXYGEN THERAPY ICD-10 Diagnosis Description 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

## 2021-07-18 NOTE — Progress Notes (Addendum)
ULAS, ZUERCHER ([id number]) Visit Report for 07/18/2021 Arrival Information Details Patient Name: Date of Service: [name] [name] 07/18/2021 7:30 A M Medical Record Number: [id number] Patient Account Number: [id number] Date of Birth/Sex: Treating RN: June 30, 1950 (71 y.o. [name] Primary Care Teriana Danker: [name] Other Clinician: [name] Referring Gurleen Larrivee: Treating Courtny Bennison/Extender: [name] Weeks in Treatment: 9 Visit Information History Since Last Visit All ordered tests and consults were completed: Yes Patient Arrived: Ambulatory Added or deleted any medications: No Arrival Time: 07:35 Any new allergies or adverse reactions: No Accompanied By: None Had a fall or experienced change in No Transfer Assistance: None activities of daily living that may affect Patient Identification Verified: Yes risk of falls: Secondary Verification Process Completed: Yes Signs or symptoms of abuse/neglect since last visito No Patient Requires Transmission-Based Precautions: No Hospitalized since last visit: No Patient Has Alerts: No Implantable device outside of the clinic excluding No cellular tissue based products placed in the center since last visit: Pain Present Now: No Notes Paper version used prior to treatment start. Electronic Signature(s) Signed: 07/18/2021 8:36:06 AM By: [name] EMT Entered By: [name] on 07/18/2021 08:36:06 -------------------------------------------------------------------------------- Encounter Discharge Information Details Patient Name: Date of Service: [name] [name] 07/18/2021 7:30 A M Medical Record Number: [id number] Patient Account Number: [id number] Date of Birth/Sex: Treating RN: [birth year] (71 y.o. [name] Primary Care Javel Hersh: [name] Other Clinician: [name] Referring Lelynd Poer: Treating Dorian Duval/Extender: [name] Weeks in  Treatment: 9 Encounter Discharge Information Items Discharge Condition: Stable Ambulatory Status: Ambulatory Discharge Destination: Home Transportation: Ambulance Accompanied By: None Schedule Follow-up Appointment: No Clinical Summary of Care: Electronic Signature(s) Signed: 07/18/2021 2:40:21 PM By: [name] EMT Entered By: [name] on 07/18/2021 14:40:20 -------------------------------------------------------------------------------- Patient/Caregiver Education Details Patient Name: Date of Service: [name] [name] 2/8/2023andnbsp7:30 [place] Number: [id number] Patient Account Number: [id number] Date of Birth/Gender: Treating RN: [birth year] (71 y.o. [name] Primary Care Physician: [name] Other Clinician: [name] Referring Physician: Treating Physician/Extender: Meridee Score in Treatment: 9 Education Assessment Education Provided To: Patient Education Topics Provided Electronic Signature(s) Signed: 07/19/2021 1:23:56 PM By: [name] EMT Entered By: [name] on 07/18/2021 14:40:04 -------------------------------------------------------------------------------- Vitals Details Patient Name: Date of Service: [name] [name] D 07/18/2021 7:30 A M Medical Record Number: [id number] Patient Account Number: [id number] Date of Birth/Sex: Treating RN: 08-Sep-1950 (71 y.o. [name] Primary Care Yazleemar Strassner: [name] Other Clinician: [name] Referring Octivia Canion: Treating Daylen Lipsky/Extender: [name] Weeks in Treatment: 9 Vital Signs Time Taken: 07:47 Temperature (F): 98.2 Height (in): 74 Pulse (bpm): 70 Weight (lbs): 315 Respiratory Rate (breaths/min): 18 Body Mass Index (BMI): 40.4 Blood Pressure (mmHg): 113/66 Reference Range: 80 - 120 mg / dl Notes Paper version used prior to treatment start. Electronic Signature(s) Signed: 07/18/2021  8:36:58 AM By: [name] EMT Entered By: [name] on 07/18/2021 08:36:57

## 2021-07-18 NOTE — Progress Notes (Addendum)
[name] [name], [name] [name] ([id number]) Visit Report for 07/18/2021 HBO Details Patient Name: Date of Service: [name] [name] 07/18/2021 7:30 A M Medical Record Number: [id number] Patient Account Number: [id number] Date of Birth/Sex: Treating RN: [birth year] (71 y.o. [name] [name] Primary Care [name]: [name] Other Clinician: [name] Referring [name]: Treating [name]/Extender: [name] Weeks in Treatment: 9 HBO Treatment Course Details Treatment Course Number: 1 Ordering Lyndi Holbein: [name] T Treatments Ordered: otal 40 HBO Treatment Start Date: 05/29/2021 HBO Indication: Late Effect of Radiation HBO Treatment Details Treatment Number: 30 Patient Type: Outpatient Chamber Type: Monoplace Chamber Serial #: [id number] Treatment Protocol: 2.0 ATA with 90 minutes oxygen, and no air breaks Treatment Details Compression Rate Down: 1.0 psi / minute De-Compression Rate Up: 1.0 psi / minute Air breaks and breathing Decompress Decompress Compress Tx Pressure Begins Reached periods Begins Ends (leave unused spaces blank) Chamber Pressure (ATA 1 2 ------2 1 ) Clock Time (24 hr) 07:57 08:13 - - - - - - 09:44 09:59 Treatment Length: 122 (minutes) Treatment Segments: 4 Vital Signs Capillary Blood Glucose Reference Range: 80 - 120 mg / dl HBO Diabetic Blood Glucose Intervention Range: <131 mg/dl or >249 mg/dl Time Vitals Blood Respiratory Capillary Blood Glucose Pulse Action Type: Pulse: Temperature: Taken: Pressure: Rate: Glucose (mg/dl): Meter #: Oximetry (%) Taken: Pre 07:47 113/66 70 18 98.2 Post 10:02 129/86 59 18 98.1 Treatment Response Treatment Toleration: Well Treatment Completion Status: Treatment Completed without Adverse Event Treatment Notes Paper version used prior to treatment start. The patient did well today clearing his ear Physician HBO Attestation: I certify that I supervised this HBO treatment in accordance  with Medicare guidelines. A trained emergency response team is readily available per Yes hospital policies and procedures. Continue HBOT as ordered. Yes Electronic Signature(s) Signed: 07/18/2021 5:07:59 PM By: Worthy Keeler PA-C Previous Signature: 07/18/2021 2:39:06 PM Version By: [name] EMT Entered By: Worthy Keeler on 07/18/2021 17:07:59 -------------------------------------------------------------------------------- HBO Safety Checklist Details Patient Name: Date of Service: [name] [name] 07/18/2021 7:30 A M Medical Record Number: [id number] Patient Account Number: [id number] Date of Birth/Sex: Treating RN: Jun 03, 1951 (71 y.o. [name] [name] Primary Care [name]: [name] Other Clinician: [name] Referring [place] Lapaglia: Treating Aubri Gathright/Extender: [name] Weeks in Treatment: 9 HBO Safety Checklist Items Safety Checklist Consent Form Signed Patient voided / foley secured and emptied When did you last eato 0700 Last dose of injectable or oral agent NA Ostomy pouch emptied and vented if applicable NA All implantable devices assessed, documented and approved NA Intravenous access site secured and place NA Valuables secured Linens and cotton and cotton/polyester blend (less than 51% polyester) Personal oil-based products / skin lotions / body lotions removed Wigs or hairpieces removed NA Smoking or tobacco materials removed NA Books / newspapers / magazines / loose paper removed Cologne, aftershave, perfume and deodorant removed Jewelry removed (may wrap wedding band) NA Make-up removed NA Hair care products removed Battery operated devices (external) removed Heating patches and chemical warmers removed Titanium eyewear removed NA Nail polish cured greater than 10 hours Casting material cured greater than 10 hours NA Hearing aids removed NA Loose dentures or partials removed NA Prosthetics have been  removed NA Patient demonstrates correct use of air break device (if applicable) Patient concerns have been addressed Patient grounding bracelet on and cord attached to chamber Specifics for Inpatients (complete in addition to above) Medication sheet sent with patient NA Intravenous medications needed or due  during therapy sent with patient NA Drainage tubes (e.g. nasogastric tube or chest tube secured and vented) NA Endotracheal or Tracheotomy tube secured NA Cuff deflated of air and inflated with saline NA Airway suctioned NA Notes Paper version used prior to treatment start. Electronic Signature(s) Signed: 07/18/2021 8:38:29 AM By: [name] EMT Entered By: [name] on 07/18/2021 12:82:08

## 2021-07-19 ENCOUNTER — Encounter (HOSPITAL_BASED_OUTPATIENT_CLINIC_OR_DEPARTMENT_OTHER): Payer: Medicare Other | Admitting: Internal Medicine

## 2021-07-19 DIAGNOSIS — R300 Dysuria: Secondary | ICD-10-CM | POA: Diagnosis not present

## 2021-07-19 DIAGNOSIS — L598 Other specified disorders of the skin and subcutaneous tissue related to radiation: Secondary | ICD-10-CM | POA: Diagnosis not present

## 2021-07-19 DIAGNOSIS — N3040 Irradiation cystitis without hematuria: Secondary | ICD-10-CM | POA: Diagnosis not present

## 2021-07-19 NOTE — Progress Notes (Signed)
[name], [name] ([id number]) Visit Report for 07/19/2021 Problem List Details Patient Name: Date of Service: [name] [name] 07/19/2021 7:30 A M Medical Record Number: [id number] Patient Account Number: [id number] Date of Birth/Sex: Treating RN: 1950/07/31 (71 y.o. 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 05/15/2021 No Yes C61 Malignant neoplasm of prostate 05/15/2021 No Yes R30.0 Dysuria 05/15/2021 No Yes Z00.00 Encounter for general adult medical examination without abnormal findings 05/15/2021 No Yes Inactive Problems Resolved Problems Electronic Signature(s) Signed: 07/19/2021 10:25:04 AM By: [name] EMT Signed: 07/19/2021 5:29:10 PM By: Linton Ham MD Entered By: [name] on 07/19/2021 10:25:03 -------------------------------------------------------------------------------- SuperBill Details Patient Name: Date of Service: [name] [name] 07/19/2021 Medical Record Number: [id number] Patient Account Number: [id number] Date of Birth/Sex: Treating RN: 31-Mar-1951 (71 y.o. 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 C61 Malignant neoplasm of  prostate R30.0 Dysuria Z00.00 Encounter for general adult medical examination  without abnormal findings Modifier: Quantity: 4 Physician Procedures : CPT4 Code Description Modifier [id number] - WC PHYS HYPERBARIC OXYGEN THERAPY ICD-10 Diagnosis Description 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

## 2021-07-19 NOTE — Progress Notes (Addendum)
DEONDRE, MARINARO ([id number]) Visit Report for 07/19/2021 HBO Details Patient Name: Date of Service: [name] 07/19/2021 7:30 A M Medical Record Number: [id number] Patient Account Number: [id number] Date of Birth/Sex: Treating RN: [birth year] (71 y.o. [name] Primary Care Kassie Keng: [name] Other Clinician: [name] Referring Lashe Oliveira: Treating Dontavious Emily/Extender: [name] in Treatment: 9 HBO Treatment Course Details Treatment Course Number: 1 Ordering Yasamin Karel: [name] T Treatments Ordered: otal 40 HBO Treatment Start Date: 05/29/2021 HBO Indication: Late Effect of Radiation HBO Treatment Details Treatment Number: 31 Patient Type: Outpatient Chamber Type: Monoplace Chamber Serial #: [id number] Treatment Protocol: 2.0 ATA with 90 minutes oxygen, and no air breaks Treatment Details Compression Rate Down: 1.0 psi / minute De-Compression Rate Up: 1.0 psi / minute Air breaks and breathing Decompress Decompress Compress Tx Pressure Begins Reached periods Begins Ends (leave unused spaces blank) Chamber Pressure (ATA 1 2 ------2 1 ) Clock Time (24 hr) 07:56 08:11 - - - - - - 09:41 09:56 Treatment Length: 120 (minutes) Treatment Segments: 4 Vital Signs Capillary Blood Glucose Reference Range: 80 - 120 mg / dl HBO Diabetic Blood Glucose Intervention Range: <131 mg/dl or >249 mg/dl Time Vitals Blood Respiratory Capillary Blood Glucose Pulse Action Type: Pulse: Temperature: Taken: Pressure: Rate: Glucose (mg/dl): Meter #: Oximetry (%) Taken: Pre 07:50 113/75 71 18 97.8 Post 09:53 122/82 59 16 98.2 Treatment Response Treatment Toleration: Well Treatment Completion Status: Treatment Completed without Adverse Event Treatment Notes Paper version used prior to treatment start. The patient did well today clearing his ear Electronic Signature(s) Signed: 07/19/2021 5:29:10 PM By: Linton Ham MD Previous  Signature: 07/19/2021 10:24:28 AM Version By: [name] EMT Entered By: Linton Ham on 07/19/2021 15:21:28 -------------------------------------------------------------------------------- HBO Safety Checklist Details Patient Name: Date of Service: [name] 07/19/2021 7:30 A M Medical Record Number: [id number] Patient Account Number: [id number] Date of Birth/Sex: Treating RN: 1950/06/11 (71 y.o. [name] Primary Care Avianah Pellman: [name] Other Clinician: [name] Referring Alassane Kalafut: Treating Ayelet Gruenewald/Extender: [name] in Treatment: 9 HBO Safety Checklist Items Safety Checklist Consent Form Signed Patient voided / foley secured and emptied When did you last eato 0600 Last dose of injectable or oral agent NA Ostomy pouch emptied and vented if applicable NA All implantable devices assessed, documented and approved NA Intravenous access site secured and place NA Valuables secured Linens and cotton and cotton/polyester blend (less than 51% polyester) Personal oil-based products / skin lotions / body lotions removed Wigs or hairpieces removed NA Smoking or tobacco materials removed Books / newspapers / magazines / loose paper removed Cologne, aftershave, perfume and deodorant removed Jewelry removed (may wrap wedding band) Make-up removed NA Hair care products removed Battery operated devices (external) removed Heating patches and chemical warmers removed Titanium eyewear removed NA Nail polish cured greater than 10 hours done on 07/18/2021 at 12:00 Casting material cured greater than 10 hours NA Hearing aids removed NA Loose dentures or partials removed NA Prosthetics have been removed NA Patient demonstrates correct use of air break device (if applicable) Patient concerns have been addressed Patient grounding bracelet on and cord attached to chamber Specifics for Inpatients (complete in addition to  above) Medication sheet sent with patient NA Intravenous medications needed or due during therapy sent with patient NA Drainage tubes (e.g. nasogastric tube or chest tube secured and vented) NA Endotracheal or Tracheotomy tube secured NA Cuff deflated of air and inflated with saline NA Airway suctioned NA  Notes Paper version used prior to treatment start. Electronic Signature(s) Signed: 07/19/2021 9:26:32 AM By: [name] EMT Entered By: [name] on 07/19/2021 09:26:31

## 2021-07-19 NOTE — Progress Notes (Addendum)
JUSTYCE, BABY ([id number]) Visit Report for 07/19/2021 Arrival Information Details Patient Name: Date of Service: [name] 07/19/2021 7:30 A M Medical Record Number: [id number] Patient Account Number: [id number] Date of Birth/Sex: Treating RN: 1951-01-02 (71 y.o. [name] Primary Care Sherah Lund: [name] Other Clinician: [name] Referring Laquenta Whitsell: Treating Lyam Provencio/Extender: [name] in Treatment: 9 Visit Information History Since Last Visit All ordered tests and consults were completed: Yes Patient Arrived: Ambulatory Added or deleted any medications: No Arrival Time: 07:40 Any new allergies or adverse reactions: No Accompanied By: None Had a fall or experienced change in No Transfer Assistance: None activities of daily living that may affect Patient Identification Verified: Yes risk of falls: Secondary Verification Process Completed: Yes Signs or symptoms of abuse/neglect since last visito No Patient Requires Transmission-Based Precautions: No Hospitalized since last visit: No Patient Has Alerts: No Implantable device outside of the clinic excluding No cellular tissue based products placed in the center since last visit: Pain Present Now: No Notes Paper version used prior to treatment start. Electronic Signature(s) Signed: 07/19/2021 9:24:13 AM By: [name] EMT Entered By: [name] on 07/19/2021 09:24:13 -------------------------------------------------------------------------------- Encounter Discharge Information Details Patient Name: Date of Service: [name] 07/19/2021 7:30 A M Medical Record Number: [id number] Patient Account Number: [id number] Date of Birth/Sex: Treating RN: 04-28-1951 (71 y.o. [name] Primary Care Sabino Denning: [name] Other Clinician: [name] Referring [place]: Treating Synai Prettyman/Extender: [name] in Treatment:  9 Encounter Discharge Information Items Discharge Condition: Stable Ambulatory Status: Ambulatory Discharge Destination: Home Transportation: Private Auto Accompanied By: None Schedule Follow-up Appointment: Yes Clinical Summary of Care: Electronic Signature(s) Signed: 07/19/2021 10:25:49 AM By: [name] EMT Entered By: [name] on 07/19/2021 10:25:49 -------------------------------------------------------------------------------- Patient/Caregiver Education Details Patient Name: Date of Service: [name] 2/9/2023andnbsp7:30 [place] Record Number: [id number] Patient Account Number: [id number] Date of Birth/Gender: Treating RN: 09/17/1950 (71 y.o. [name] Primary Care Physician: [name] Other Clinician: [name] Referring Physician: Treating Physician/Extender: [name] in Treatment: 9 Education Assessment Education Provided To: Patient Education Topics Provided Electronic Signature(s) Signed: 07/19/2021 1:23:56 PM By: [name] EMT Entered By: [name] on 07/19/2021 10:25:27 -------------------------------------------------------------------------------- Vitals Details Patient Name: Date of Service: [name] 07/19/2021 7:30 A M Medical Record Number: [id number] Patient Account Number: [id number] Date of Birth/Sex: Treating RN: 03-17-1951 (71 y.o. [name] Primary Care Jaelynn Pozo: [name] Other Clinician: [name] Referring Phillippa Straub: Treating Saira Kramme/Extender: [name] in Treatment: 9 Vital Signs Time Taken: 07:50 Temperature (F): 97.8 Height (in): 74 Pulse (bpm): 71 Weight (lbs): 315 Respiratory Rate (breaths/min): 18 Body Mass Index (BMI): 40.4 Blood Pressure (mmHg): 113/75 Reference Range: 80 - 120 mg / dl Notes Paper version used prior to treatment start. Electronic Signature(s) Signed: 07/19/2021 9:24:54 AM  By: [name] EMT Entered By: [name] on 07/19/2021 09:24:53

## 2021-07-20 ENCOUNTER — Encounter (HOSPITAL_BASED_OUTPATIENT_CLINIC_OR_DEPARTMENT_OTHER): Payer: Medicare Other | Admitting: Internal Medicine

## 2021-07-20 ENCOUNTER — Other Ambulatory Visit: Payer: Self-pay

## 2021-07-20 DIAGNOSIS — C61 Malignant neoplasm of prostate: Secondary | ICD-10-CM | POA: Diagnosis not present

## 2021-07-20 DIAGNOSIS — N3040 Irradiation cystitis without hematuria: Secondary | ICD-10-CM

## 2021-07-20 DIAGNOSIS — R300 Dysuria: Secondary | ICD-10-CM | POA: Diagnosis not present

## 2021-07-20 NOTE — Progress Notes (Addendum)
HARMON, BOMMARITO ([id number]) Visit Report for 07/20/2021 HBO Details Patient Name: Date of Service: [name] [name] 07/20/2021 7:30 A M Medical Record Number: [id number] Patient Account Number: [id number] Date of Birth/Sex: Treating RN: Jun 16, 1950 (71 y.o. [name] Primary Care Marilene Vath: [name] Other Clinician: [name] Referring Jhaden Pizzuto: Treating Zooey Schreurs/Extender: [name]: 9 HBO Treatment Course Details Treatment Course Number: 1 Ordering Durrell Barajas: [name] T Treatments Ordered: otal 40 HBO Treatment Start Date: 05/29/2021 HBO Indication: Late Effect of Radiation HBO Treatment Details Treatment Number: 32 Patient Type: Outpatient Chamber Type: Monoplace Chamber Serial #: [id number] Treatment Protocol: 2.0 ATA with 90 minutes oxygen, and no air breaks Treatment Details Compression Rate Down: 1.0 psi / minute De-Compression Rate Up: 1.0 psi / minute Air breaks and breathing Decompress Decompress Compress Tx Pressure Begins Reached periods Begins Ends (leave unused spaces blank) Chamber Pressure (ATA 1 2 ------2 1 ) Clock Time (24 hr) 08:25 08:39 - - - - - - 10:09 10:22 Treatment Length: 117 (minutes) Treatment Segments: 4 Vital Signs Capillary Blood Glucose Reference Range: 80 - 120 mg / dl HBO Diabetic Blood Glucose Intervention Range: <131 mg/dl or >249 mg/dl Time Vitals Blood Respiratory Capillary Blood Glucose Pulse Action Type: Pulse: Temperature: Taken: Pressure: Rate: Glucose (mg/dl): Meter #: Oximetry (%) Taken: Pre 08:01 113/71 72 16 97.7 Post 10:25 124/82 62 16 97.7 Treatment Response Treatment Toleration: Well Treatment Completion Status: Treatment Completed without Adverse Event Treatment Notes Paper version used prior to treatment start. The patient did well today clearing his ear Physician HBO Attestation: I certify that I supervised this HBO treatment in accordance  with Medicare guidelines. A trained emergency response team is readily available per Yes hospital policies and procedures. Continue HBOT as ordered. Yes Electronic Signature(s) Signed: 07/20/2021 12:39:47 PM By: [name] DO Previous Signature: 07/20/2021 10:46:17 AM Version By: [name] EMT Entered By: [name] on 07/20/2021 12:37:25 -------------------------------------------------------------------------------- HBO Safety Checklist Details Patient Name: Date of Service: [name] [name] 07/20/2021 7:30 A M Medical Record Number: [id number] Patient Account Number: [id number] Date of Birth/Sex: Treating RN: 08-04-1950 (71 y.o. [name] Primary Care Whitley Patchen: [name] Other Clinician: [name] Referring Achsah Mcquade: Treating Destiny Trickey/Extender: [name]: 9 HBO Safety Checklist Items Safety Checklist Consent Form Signed Patient voided / foley secured and emptied When did you last eato 0600 Last dose of injectable or oral agent NA Ostomy pouch emptied and vented if applicable NA All implantable devices assessed, documented and approved NA Intravenous access site secured and place NA Valuables secured Linens and cotton and cotton/polyester blend (less than 51% polyester) Personal oil-based products / skin lotions / body lotions removed Wigs or hairpieces removed NA Smoking or tobacco materials removed Books / newspapers / magazines / loose paper removed Cologne, aftershave, perfume and deodorant removed Jewelry removed (may wrap wedding band) NA Make-up removed NA Hair care products removed Battery operated devices (external) removed Heating patches and chemical warmers removed Titanium eyewear removed NA Nail polish cured greater than 10 hours Casting material cured greater than 10 hours NA Hearing aids removed NA Loose dentures or partials removed NA Prosthetics have been  removed NA Patient demonstrates correct use of air break device (if applicable) Patient concerns have been addressed Patient grounding bracelet on and cord attached to chamber Specifics for Inpatients (complete in addition to above) Medication sheet sent with patient NA Intravenous medications needed or due during therapy sent with patient  NA Drainage tubes (e.g. nasogastric tube or chest tube secured and vented) NA Endotracheal or Tracheotomy tube secured NA Cuff deflated of air and inflated with saline NA Airway suctioned NA Notes Paper version used prior to treatment start. Electronic Signature(s) Signed: 07/20/2021 10:44:48 AM By: [name] EMT Previous Signature: 07/20/2021 9:28:07 AM Version By: [name] EMT Entered By: [name] on 07/20/2021 10:44:47

## 2021-07-20 NOTE — Progress Notes (Signed)
ARNELL, SLIVINSKI ([id number]) Visit Report for 07/20/2021 Problem List Details Patient Name: Date of Service: [name] 07/20/2021 7:30 A M Medical Record Number: [id number] Patient Account Number: [id number] Date of Birth/Sex: Treating RN: 1951-04-22 (71 y.o. 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 05/15/2021 No Yes C61 Malignant neoplasm of prostate 05/15/2021 No Yes R30.0 Dysuria 05/15/2021 No Yes Z00.00 Encounter for general adult medical examination without abnormal findings 05/15/2021 No Yes Inactive Problems Resolved Problems Electronic Signature(s) Signed: 07/20/2021 10:46:49 AM By: [name] EMT Signed: 07/20/2021 12:39:47 PM By: [name] DO Entered By: [name] on 07/20/2021 10:46:49 -------------------------------------------------------------------------------- SuperBill Details Patient Name: Date of Service: [name] 07/20/2021 Medical Record Number: [id number] Patient Account Number: [id number] Date of Birth/Sex: Treating RN: 25-Jan-1951 (71 y.o. 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 C61 Malignant  neoplasm of prostate R30.0 Dysuria Z00.00 Encounter for general adult medical examination  without abnormal findings Modifier: Quantity: 4 Physician Procedures : CPT4 Code Description Modifier [id number] - WC PHYS HYPERBARIC OXYGEN THERAPY ICD-10 Diagnosis Description C61 Malignant neoplasm of prostate 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

## 2021-07-20 NOTE — Progress Notes (Addendum)
TAHJE, BORAWSKI ([id number]) Visit Report for 07/20/2021 Arrival Information Details Patient Name: Date of Service: [name] [name] 07/20/2021 7:30 A M Medical Record Number: [id number] Patient Account Number: [id number] Date of Birth/Sex: Treating RN: May 23, 1951 (71 y.o. [name] Primary Care Roshad Hack: [name] Other Clinician: [name] Referring Jerrell Hart: Treating Rexford Prevo/Extender: [name] in Treatment: 9 Visit Information History Since Last Visit All ordered tests and consults were completed: Yes Patient Arrived: Ambulatory Added or deleted any medications: No Arrival Time: 07:45 Any new allergies or adverse reactions: No Accompanied By: None Had a fall or experienced change in No Transfer Assistance: None activities of daily living that may affect Patient Identification Verified: Yes risk of falls: Secondary Verification Process Completed: Yes Signs or symptoms of abuse/neglect since last visito No Patient Requires Transmission-Based Precautions: No Hospitalized since last visit: No Patient Has Alerts: No Implantable device outside of the clinic excluding No cellular tissue based products placed in the center since last visit: Pain Present Now: No Notes Paper version used prior to treatment start. Electronic Signature(s) Signed: 07/20/2021 9:24:28 AM By: [name] EMT Entered By: [name] on 07/20/2021 09:24:28 -------------------------------------------------------------------------------- Encounter Discharge Information Details Patient Name: Date of Service: [name] [name] 07/20/2021 7:30 A M Medical Record Number: [id number] Patient Account Number: [id number] Date of Birth/Sex: Treating RN: 1950/06/15 (71 y.o. [name] Primary Care Yaretsi Humphres: [name] Other Clinician: [name] Referring Aseneth Hack: Treating Tanith Dagostino/Extender: [name] in  Treatment: 9 Encounter Discharge Information Items Discharge Condition: Stable Ambulatory Status: Ambulatory Discharge Destination: Home Transportation: Private Auto Accompanied By: None Schedule Follow-up Appointment: Yes Clinical Summary of Care: Electronic Signature(s) Signed: 07/20/2021 10:47:25 AM By: [name] EMT Entered By: [name] on 07/20/2021 10:47:25 -------------------------------------------------------------------------------- Patient/Caregiver Education Details Patient Name: Date of Service: [name] [name] 2/10/2023andnbsp7:30 [place] Number: [id number] Patient Account Number: [id number] Date of Birth/Gender: Treating RN: 1950/12/05 (71 y.o. [name] Primary Care Physician: [name] Other Clinician: [name] Referring Physician: Treating Physician/Extender: [name] in Treatment: 9 Education Assessment Education Provided To: Patient Education Topics Provided Electronic Signature(s) Signed: 07/20/2021 1:22:11 PM By: [name] EMT Entered By: [name] on 07/20/2021 10:47:07 -------------------------------------------------------------------------------- Vitals Details Patient Name: Date of Service: [name] 07/20/2021 7:30 A M Medical Record Number: [id number] Patient Account Number: [id number] Date of Birth/Sex: Treating RN: [birth year] (72 y.o. [name] Primary Care Kharon Hixon: [name] Other Clinician: [name] Referring Monnica Saltsman: Treating Trevian Hayashida/Extender: [name]: 9 Vital Signs Time Taken: 08:01 Temperature (F): 97.7 Height (in): 74 Pulse (bpm): 72 Weight (lbs): 315 Respiratory Rate (breaths/min): 16 Body Mass Index (BMI): 40.4 Blood Pressure (mmHg): 113/71 Reference Range: 80 - 120 mg / dl Notes Paper version used prior to treatment start. Electronic Signature(s) Signed:  07/20/2021 10:44:34 AM By: [name] EMT Previous Signature: 07/20/2021 9:26:58 AM Version By: [name] EMT Entered By: [name] on 07/20/2021 10:44:34

## 2021-07-23 ENCOUNTER — Encounter (HOSPITAL_BASED_OUTPATIENT_CLINIC_OR_DEPARTMENT_OTHER): Payer: Medicare Other | Admitting: Internal Medicine

## 2021-07-23 ENCOUNTER — Other Ambulatory Visit: Payer: Self-pay

## 2021-07-23 DIAGNOSIS — N3040 Irradiation cystitis without hematuria: Secondary | ICD-10-CM

## 2021-07-23 DIAGNOSIS — C61 Malignant neoplasm of prostate: Secondary | ICD-10-CM | POA: Diagnosis not present

## 2021-07-23 DIAGNOSIS — R300 Dysuria: Secondary | ICD-10-CM | POA: Diagnosis not present

## 2021-07-23 NOTE — Progress Notes (Addendum)
NTHONY, [name] ([id number]) Visit Report for 07/23/2021 HBO Details Patient Name: Date of Service: [name] [name] 07/23/2021 7:30 A M Medical Record Number: [id number] Patient Account Number: [id number] Date of Birth/Sex: Treating RN: 1951-01-16 (71 y.o. [name] Primary Care Rutha Melgoza: [name] Other Clinician: [name] Referring Latif Nazareno: Treating [name] [name]/Extender: [name]: 9 HBO Treatment Course Details Treatment Course Number: 1 Ordering Zeriah Baysinger: [name] T Treatments Ordered: otal 40 HBO Treatment Start Date: 05/29/2021 HBO Indication: Late Effect of Radiation HBO Treatment Details Treatment Number: 33 Patient Type: Outpatient Chamber Type: Monoplace Chamber Serial #: [id number] Treatment Protocol: 2.0 ATA with 90 minutes oxygen, and no air breaks Treatment Details Compression Rate Down: 1.0 psi / minute De-Compression Rate Up: 1.0 psi / minute Air breaks and breathing Decompress Decompress Compress Tx Pressure Begins Reached periods Begins Ends (leave unused spaces blank) Chamber Pressure (ATA 1 2 ------2 1 ) Clock Time (24 hr) 08:25 08:43 - - - - - - 10:13 10:27 Treatment Length: 122 (minutes) Treatment Segments: 4 Vital Signs Capillary Blood Glucose Reference Range: 80 - 120 mg / dl HBO Diabetic Blood Glucose Intervention Range: <131 mg/dl or >249 mg/dl Time Vitals Blood Respiratory Capillary Blood Glucose Pulse Action Type: Pulse: Temperature: Taken: Pressure: Rate: Glucose (mg/dl): Meter #: Oximetry (%) Taken: Pre 08:03 120/69 59 16 97.6 Post 10:31 124/88 63 16 98.1 Treatment Response Treatment Toleration: Well Treatment Completion Status: Treatment Completed without Adverse Event Treatment Notes Paper version used prior to treatment start. The patient did well today clearing his ear. Physician HBO Attestation: I certify that I supervised this HBO treatment in accordance  with Medicare guidelines. A trained emergency response team is readily available per Yes hospital policies and procedures. Continue HBOT as ordered. Yes Electronic Signature(s) Signed: 07/23/2021 1:44:00 PM By: [name] DO Previous Signature: 07/23/2021 12:41:39 PM Version By: [name] EMT Entered By: [name] on 07/23/2021 12:58:33 -------------------------------------------------------------------------------- HBO Safety Checklist Details Patient Name: Date of Service: [name] [name] D 07/23/2021 7:30 A M Medical Record Number: [id number] Patient Account Number: [id number] Date of Birth/Sex: Treating RN: 11-28-1950 (71 y.o. [name] Primary Care Alexiah Koroma: [name] Other Clinician: [name] Referring Konstantine Gervasi: Treating Haidee Stogsdill/Extender: [name]: 9 HBO Safety Checklist Items Safety Checklist Consent Form Signed Patient voided / foley secured and emptied When did you last eato 0600 Last dose of injectable or oral agent NA Ostomy pouch emptied and vented if applicable NA All implantable devices assessed, documented and approved NA Intravenous access site secured and place NA Valuables secured Linens and cotton and cotton/polyester blend (less than 51% polyester) Personal oil-based products / skin lotions / body lotions removed Wigs or hairpieces removed NA Smoking or tobacco materials removed Books / newspapers / magazines / loose paper removed Cologne, aftershave, perfume and deodorant removed Jewelry removed (may wrap wedding band) NA Make-up removed NA Hair care products removed Battery operated devices (external) removed Heating patches and chemical warmers removed Titanium eyewear removed NA Nail polish cured greater than 10 hours Casting material cured greater than 10 hours NA Hearing aids removed NA Loose dentures or partials removed NA Prosthetics have been  removed NA Patient demonstrates correct use of air break device (if applicable) Patient concerns have been addressed Patient grounding bracelet on and cord attached to chamber Specifics for Inpatients (complete in addition to above) Medication sheet sent with patient NA Intravenous medications needed or due during therapy sent with patient  NA Drainage tubes (e.g. nasogastric tube or chest tube secured and vented) NA Endotracheal or Tracheotomy tube secured NA Cuff deflated of air and inflated with saline NA Airway suctioned NA Notes Paper version used prior to treatment start. Electronic Signature(s) Signed: 07/23/2021 12:39:32 PM By: [name] EMT Entered By: [name] on 07/23/2021 12:39:32

## 2021-07-23 NOTE — Progress Notes (Addendum)
[name], [name] ([id number]) Visit Report for 07/23/2021 Arrival Information Details Patient Name: Date of Service: [name] [name] 07/23/2021 7:30 A M Medical Record Number: [id number] Patient Account Number: [id number] Date of Birth/Sex: Treating RN: March 15, 1951 (71 y.o. [name] Primary Care Malakye Nolden: [name] Other Clinician: [name] Referring Kailee Essman: Treating Earnie Bechard/Extender: [name] in Treatment: 9 Visit Information History Since Last Visit All ordered tests and consults were completed: Yes Patient Arrived: Ambulatory Added or deleted any medications: No Arrival Time: 07:37 Any new allergies or adverse reactions: No Accompanied By: None Had a fall or experienced change in No Transfer Assistance: None activities of daily living that may affect Patient Identification Verified: Yes risk of falls: Secondary Verification Process Completed: Yes Signs or symptoms of abuse/neglect since last visito No Patient Requires Transmission-Based Precautions: No Hospitalized since last visit: No Patient Has Alerts: No Implantable device outside of the clinic excluding No cellular tissue based products placed in the center since last visit: Pain Present Now: No Notes Paper version used prior to treatment start. Electronic Signature(s) Signed: 07/23/2021 12:37:17 PM By: [name] EMT Entered By: [name] on 07/23/2021 12:37:16 -------------------------------------------------------------------------------- Encounter Discharge Information Details Patient Name: Date of Service: [name] [name] 07/23/2021 7:30 A M Medical Record Number: [id number] Patient Account Number: [id number] Date of Birth/Sex: Treating RN: 04-29-1951 (71 y.o. [name] Primary Care Riah Kehoe: [name] Other Clinician: [name] Referring Bracken Moffa: Treating Collin Rengel/Extender: [name] in  Treatment: 9 Encounter Discharge Information Items Discharge Condition: Stable Ambulatory Status: Ambulatory Discharge Destination: Home Transportation: Private Auto Accompanied By: None Schedule Follow-up Appointment: Yes Clinical Summary of Care: Electronic Signature(s) Signed: 07/23/2021 12:43:04 PM By: [name] EMT Entered By: [name] on 07/23/2021 12:43:04 -------------------------------------------------------------------------------- Patient/Caregiver Education Details Patient Name: Date of Service: [name] [name] 2/13/2023andnbsp7:30 A M Medical Record Number: [id number] Patient Account Number: [id number] Date of Birth/Gender: Treating RN: [birth year] (71 y.o. [name] Primary Care Physician: [name] Other Clinician: [name] Referring Physician: Treating Physician/Extender: [name] in Treatment: 9 Education Assessment Education Provided To: Patient Education Topics Provided Electronic Signature(s) Signed: 07/23/2021 3:47:27 PM By: [name] EMT Entered By: [name] on 07/23/2021 12:42:43 -------------------------------------------------------------------------------- Vitals Details Patient Name: Date of Service: [name] [name] 07/23/2021 7:30 A M Medical Record Number: [id number] Patient Account Number: [id number] Date of Birth/Sex: Treating RN: [birth year] (71 y.o. [name] Primary Care Ayomikun Starling: [name] Other Clinician: [name] Referring Lacharles Altschuler: Treating Tonantzin Mimnaugh/Extender: [name]: 9 Vital Signs Time Taken: 08:03 Temperature (F): 97.6 Height (in): 74 Pulse (bpm): 59 Weight (lbs): 315 Respiratory Rate (breaths/min): 16 Body Mass Index (BMI): 40.4 Blood Pressure (mmHg): 120/69 Reference Range: 80 - 120 mg / dl Notes Paper version used prior to treatment start. Electronic Signature(s) Signed:  07/23/2021 12:37:51 PM By: [name] EMT Entered By: [name] on 07/23/2021 12:37:51

## 2021-07-23 NOTE — Progress Notes (Signed)
DARIO, YONO ([id number]) Visit Report for 07/23/2021 Problem List Details Patient Name: Date of Service: [name] [name] 07/23/2021 7:30 A M Medical Record Number: [id number] Patient Account Number: [id number] Date of Birth/Sex: Treating RN: [birth year] (71 y.o. 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 05/15/2021 No Yes C61 Malignant neoplasm of prostate 05/15/2021 No Yes R30.0 Dysuria 05/15/2021 No Yes Z00.00 Encounter for general adult medical examination without abnormal findings 05/15/2021 No Yes Inactive Problems Resolved Problems Electronic Signature(s) Signed: 07/23/2021 12:42:18 PM By: [name] EMT Signed: 07/23/2021 1:44:00 PM By: [name] DO Entered By: [name] on 07/23/2021 12:42:18 -------------------------------------------------------------------------------- SuperBill Details Patient Name: Date of Service: [name] [name] 07/23/2021 Medical Record Number: [id number] Patient Account Number: [id number] Date of Birth/Sex: Treating RN: 04-05-1951 (71 y.o. 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 C61 Malignant neoplasm  of prostate R30.0 Dysuria Z00.00 Encounter for general adult medical examination  without abnormal findings Modifier: Quantity: 4 Physician Procedures : CPT4 Code Description Modifier [id number] - WC PHYS HYPERBARIC OXYGEN THERAPY ICD-10 Diagnosis Description 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

## 2021-07-24 ENCOUNTER — Encounter (HOSPITAL_BASED_OUTPATIENT_CLINIC_OR_DEPARTMENT_OTHER): Payer: Medicare Other | Admitting: Internal Medicine

## 2021-07-24 DIAGNOSIS — N3040 Irradiation cystitis without hematuria: Secondary | ICD-10-CM | POA: Diagnosis not present

## 2021-07-24 DIAGNOSIS — L598 Other specified disorders of the skin and subcutaneous tissue related to radiation: Secondary | ICD-10-CM | POA: Diagnosis not present

## 2021-07-24 DIAGNOSIS — R300 Dysuria: Secondary | ICD-10-CM | POA: Diagnosis not present

## 2021-07-24 NOTE — Progress Notes (Addendum)
[name] [name], [name] [name] ([id number]) Visit Report for 07/24/2021 Arrival Information Details Patient Name: Date of Service: [name] [name] 07/24/2021 8:00 A M Medical Record Number: [id number] Patient Account Number: [id number] Date of Birth/Sex: Treating RN: 18-Sep-1950 (71 y.o. [name] [name] Primary Care Ladene Allocca: [name] Other Clinician: [name] Referring Zeev Deakins: Treating Frantz Quattrone/Extender: [name] in Treatment: 10 Visit Information History Since Last Visit All ordered tests and consults were completed: Yes Patient Arrived: Ambulatory Added or deleted any medications: No Arrival Time: 07:33 Any new allergies or adverse reactions: No Accompanied By: None Had a fall or experienced change in No Transfer Assistance: None activities of daily living that may affect Patient Identification Verified: Yes risk of falls: Secondary Verification Process Completed: Yes Signs or symptoms of abuse/neglect since last visito No Patient Requires Transmission-Based Precautions: No Hospitalized since last visit: No Patient Has Alerts: No Implantable device outside of the clinic excluding No cellular tissue based products placed in the center since last visit: Pain Present Now: No Notes Paper version used prior to treatment start. Electronic Signature(s) Signed: 07/24/2021 9:43:17 AM By: [name] EMT Entered By: [name] on 07/24/2021 09:43:17 -------------------------------------------------------------------------------- Encounter Discharge Information Details Patient Name: Date of Service: [name] [name] 07/24/2021 8:00 A M Medical Record Number: [id number] Patient Account Number: [id number] Date of Birth/Sex: Treating RN: November 22, 1950 (71 y.o. [name] [name] Primary Care Marcellene Shivley: [name] Other Clinician: [name] Referring Elease Swarm: Treating Markiya Keefe/Extender: [name] in  Treatment: 10 Encounter Discharge Information Items Discharge Condition: Stable Ambulatory Status: Ambulatory Discharge Destination: Home Transportation: Private Auto Accompanied By: None Schedule Follow-up Appointment: Yes Clinical Summary of Care: Electronic Signature(s) Signed: 07/24/2021 1:36:11 PM By: [name] EMT Entered By: [name] on 07/24/2021 13:36:11 -------------------------------------------------------------------------------- Patient/Caregiver Education Details Patient Name: Date of Service: [name] [name] 2/14/2023andnbsp8:00 A M Medical Record Number: [id number] Patient Account Number: [id number] Date of Birth/Gender: Treating RN: 20-Aug-1950 (71 y.o. [name] [name] Primary Care Physician: [name] Other Clinician: [name] Referring Physician: Treating Physician/Extender: [name] in Treatment: 10 Education Assessment Education Provided To: Patient Education Topics Provided Electronic Signature(s) Signed: 07/24/2021 4:12:20 PM By: [name] EMT Entered By: [name] on 07/24/2021 13:35:52 -------------------------------------------------------------------------------- Vitals Details Patient Name: Date of Service: [name] [name] 07/24/2021 8:00 A M Medical Record Number: [id number] Patient Account Number: [id number] Date of Birth/Sex: Treating RN: 11-Mar-1951 (71 y.o. [name] [name] Primary Care Theadora Noyes: [name] Other Clinician: [name] Referring Nayla Dias: Treating Krisann Mckenna/Extender: [name] in Treatment: 10 Vital Signs Time Taken: 07:45 Temperature (F): 97.7 Height (in): 74 Pulse (bpm): 71 Weight (lbs): 315 Respiratory Rate (breaths/min): 16 Body Mass Index (BMI): 40.4 Blood Pressure (mmHg): 130/72 Reference Range: 80 - 120 mg / dl Notes Paper version used prior to treatment start. Electronic Signature(s) Signed:  07/24/2021 9:43:55 AM By: [name] EMT Entered By: [name] on 07/24/2021 09:43:55

## 2021-07-24 NOTE — Progress Notes (Addendum)
[name], [name] ([id number]) Visit Report for 07/24/2021 HBO Details Patient Name: Date of Service: [name] [name] 07/24/2021 8:00 A M Medical Record Number: [id number] Patient Account Number: [id number] Date of Birth/Sex: Treating RN: 12-03-1950 (71 y.o. [name] [name] Primary Care Burleigh Brockmann: [name] Other Clinician: [name] Referring Tymber Stallings: Treating Karanvir Balderston/Extender: [name] in Treatment: 10 HBO Treatment Course Details Treatment Course Number: 1 Ordering Jerris Keltz: [name] T Treatments Ordered: otal 40 HBO Treatment Start Date: 05/29/2021 HBO Indication: Late Effect of Radiation HBO Treatment Details Treatment Number: 34 Patient Type: Outpatient Chamber Type: Monoplace Chamber Serial #: [id number] Treatment Protocol: 2.0 ATA with 90 minutes oxygen, and no air breaks Treatment Details Compression Rate Down: 1.0 psi / minute De-Compression Rate Up: 1.0 psi / minute Air breaks and breathing Decompress Decompress Compress Tx Pressure Begins Reached periods Begins Ends (leave unused spaces blank) Chamber Pressure (ATA 1 2 ------2 1 ) Clock Time (24 hr) 07:52 08:11 - - - - - - 09:41 09:56 Treatment Length: 124 (minutes) Treatment Segments: 4 Vital Signs Capillary Blood Glucose Reference Range: 80 - 120 mg / dl HBO Diabetic Blood Glucose Intervention Range: <131 mg/dl or >249 mg/dl Time Vitals Blood Respiratory Capillary Blood Glucose Pulse Action Type: Pulse: Temperature: Taken: Pressure: Rate: Glucose (mg/dl): Meter #: Oximetry (%) Taken: Pre 07:45 130/72 71 16 97.7 Post 09:59 128/76 59 16 98 Treatment Response Treatment Toleration: Well Treatment Completion Status: Treatment Completed without Adverse Event Treatment Notes Paper version used prior to treatment start. The patient did well today clearing his ear. He stated that he felt like the treatment were helping. Najah Liverman Notes No concerns with  treatment given Physician HBO Attestation: I certify that I supervised this HBO treatment in accordance with Medicare guidelines. A trained emergency response team is readily available per Yes hospital policies and procedures. Continue HBOT as ordered. Yes Electronic Signature(s) Signed: 07/24/2021 4:15:21 PM By: Linton Ham MD Previous Signature: 07/24/2021 1:34:11 PM Version By: [name] EMT Entered By: Linton Ham on 07/24/2021 15:44:58 -------------------------------------------------------------------------------- HBO Safety Checklist Details Patient Name: Date of Service: [name] [name] 07/24/2021 8:00 A M Medical Record Number: [id number] Patient Account Number: [id number] Date of Birth/Sex: Treating RN: 1951-02-10 (71 y.o. [name] [name] Primary Care Carmichael Burdette: [name] Other Clinician: [name] Referring Melanie Pellot: Treating Vaneta Hammontree/Extender: [name] in Treatment: 10 HBO Safety Checklist Items Safety Checklist Consent Form Signed Patient voided / foley secured and emptied When did you last eato 0600 Last dose of injectable or oral agent NA Ostomy pouch emptied and vented if applicable NA All implantable devices assessed, documented and approved NA Intravenous access site secured and place NA Valuables secured NA Linens and cotton and cotton/polyester blend (less than 51% polyester) Personal oil-based products / skin lotions / body lotions removed Wigs or hairpieces removed NA Smoking or tobacco materials removed Books / newspapers / magazines / loose paper removed Cologne, aftershave, perfume and deodorant removed Jewelry removed (may wrap wedding band) NA Make-up removed NA Hair care products removed Battery operated devices (external) removed Heating patches and chemical warmers removed Titanium eyewear removed NA Nail polish cured greater than 10 hours Casting material cured greater than 10  hours NA Hearing aids removed NA Loose dentures or partials removed NA Prosthetics have been removed NA Patient demonstrates correct use of air break device (if applicable) Patient concerns have been addressed Patient grounding bracelet on and cord attached to chamber Specifics for Inpatients (complete in addition  to above) Medication sheet sent with patient NA Intravenous medications needed or due during therapy sent with patient NA Drainage tubes (e.g. nasogastric tube or chest tube secured and vented) NA Endotracheal or Tracheotomy tube secured NA Cuff deflated of air and inflated with saline NA Airway suctioned NA Electronic Signature(s) Signed: 07/24/2021 9:45:08 AM By: [name] EMT Entered By: [name] on 07/24/2021 09:45:08

## 2021-07-24 NOTE — Progress Notes (Signed)
CESAREO, VICKREY ([id number]) Visit Report for 07/24/2021 Problem List Details Patient Name: Date of Service: [name] [name] 07/24/2021 8:00 A M Medical Record Number: [id number] Patient Account Number: [id number] Date of Birth/Sex: Treating RN: Sep 01, 1950 (71 y.o. 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 05/15/2021 No Yes C61 Malignant neoplasm of prostate 05/15/2021 No Yes R30.0 Dysuria 05/15/2021 No Yes Z00.00 Encounter for general adult medical examination without abnormal findings 05/15/2021 No Yes Inactive Problems Resolved Problems Electronic Signature(s) Signed: 07/24/2021 1:34:44 PM By: [name] EMT Signed: 07/24/2021 4:15:21 PM By: Linton Ham MD Entered By: [name] on 07/24/2021 13:34:44 -------------------------------------------------------------------------------- SuperBill Details Patient Name: Date of Service: [name] [name] 07/24/2021 Medical Record Number: [id number] Patient Account Number: [id number] Date of Birth/Sex: Treating RN: 10-02-1950 (71 y.o. 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 C61 Malignant neoplasm  of prostate R30.0 Dysuria Z00.00 Encounter for general adult medical examination  without abnormal findings Modifier: Quantity: 4 Physician Procedures : CPT4 Code Description Modifier [id number] 02334 - WC PHYS HYPERBARIC OXYGEN THERAPY ICD-10 Diagnosis Description 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

## 2021-07-25 ENCOUNTER — Other Ambulatory Visit: Payer: Self-pay

## 2021-07-25 ENCOUNTER — Encounter (HOSPITAL_BASED_OUTPATIENT_CLINIC_OR_DEPARTMENT_OTHER): Payer: Medicare Other | Admitting: Internal Medicine

## 2021-07-25 DIAGNOSIS — R300 Dysuria: Secondary | ICD-10-CM | POA: Diagnosis not present

## 2021-07-25 DIAGNOSIS — L598 Other specified disorders of the skin and subcutaneous tissue related to radiation: Secondary | ICD-10-CM | POA: Diagnosis not present

## 2021-07-25 DIAGNOSIS — N3040 Irradiation cystitis without hematuria: Secondary | ICD-10-CM | POA: Diagnosis not present

## 2021-07-25 NOTE — Progress Notes (Addendum)
DELMORE, SEAR ([id number]) Visit Report for 07/25/2021 HBO Details Patient Name: Date of Service: [name] 07/25/2021 8:00 A M Medical Record Number: [id number] Patient Account Number: [id number] Date of Birth/Sex: Treating RN: May 29, 1951 (71 y.o. [name], [name] Primary Care Aailyah Dunbar: [name] Other Clinician: [name] Referring Saraphina Lauderbaugh: Treating Sparkles Mcneely/Extender: [name] in Treatment: 10 HBO Treatment Course Details Treatment Course Number: 1 Ordering Jocelynn Gioffre: [name] T Treatments Ordered: otal 40 HBO Treatment Start Date: 05/29/2021 HBO Indication: Late Effect of Radiation HBO Treatment Details Treatment Number: 35 Patient Type: Outpatient Chamber Type: Monoplace Chamber Serial #: [id number] Treatment Protocol: 2.0 ATA with 90 minutes oxygen, and no air breaks Treatment Details Compression Rate Down: 1.0 psi / minute De-Compression Rate Up: 1.0 psi / minute Air breaks and breathing Decompress Decompress Compress Tx Pressure Begins Reached periods Begins Ends (leave unused spaces blank) Chamber Pressure (ATA 1 2 ------2 1 ) Clock Time (24 hr) 07:58 08:14 - - - - - - 09:45 09:59 Treatment Length: 121 (minutes) Treatment Segments: 4 Vital Signs Capillary Blood Glucose Reference Range: 80 - 120 mg / dl HBO Diabetic Blood Glucose Intervention Range: <131 mg/dl or >249 mg/dl Time Vitals Blood Respiratory Capillary Blood Glucose Pulse Action Type: Pulse: Temperature: Taken: Pressure: Rate: Glucose (mg/dl): Meter #: Oximetry (%) Taken: Pre 07:52 112/72 71 18 97.7 Post 10:02 123/79 64 18 97.7 Treatment Response Treatment Toleration: Well Treatment Completion Status: Treatment Completed without Adverse Event Treatment Notes Paper version used prior to treatment start. The patient did well today with his treatment. Alvis Edgell Notes No concerns with treatment given Physician HBO Attestation: I  certify that I supervised this HBO treatment in accordance with Medicare guidelines. A trained emergency response team is readily available per Yes hospital policies and procedures. Continue HBOT as ordered. Yes Electronic Signature(s) Signed: 07/25/2021 4:36:32 PM By: Linton Ham MD Previous Signature: 07/25/2021 10:33:12 AM Version By: [name] EMT Entered By: Linton Ham on 07/25/2021 16:08:48 -------------------------------------------------------------------------------- HBO Safety Checklist Details Patient Name: Date of Service: [name] 07/25/2021 8:00 A M Medical Record Number: [id number] Patient Account Number: [id number] Date of Birth/Sex: Treating RN: 1950/08/05 (71 y.o. [name], [name] Primary Care Marceline Napierala: [name] Other Clinician: [name] Referring Roman Dubuc: Treating Matheson Vandehei/Extender: [name] in Treatment: 10 HBO Safety Checklist Items Safety Checklist Consent Form Signed Patient voided / foley secured and emptied When did you last eato 0600 Last dose of injectable or oral agent NA Ostomy pouch emptied and vented if applicable NA All implantable devices assessed, documented and approved NA Intravenous access site secured and place NA Valuables secured Linens and cotton and cotton/polyester blend (less than 51% polyester) Personal oil-based products / skin lotions / body lotions removed Wigs or hairpieces removed NA Smoking or tobacco materials removed Books / newspapers / magazines / loose paper removed Cologne, aftershave, perfume and deodorant removed Jewelry removed (may wrap wedding band) NA Make-up removed NA Hair care products removed Battery operated devices (external) removed Heating patches and chemical warmers removed Titanium eyewear removed NA Nail polish cured greater than 10 hours Casting material cured greater than 10 hours NA Hearing aids removed NA Loose  dentures or partials removed NA Prosthetics have been removed NA Patient demonstrates correct use of air break device (if applicable) Patient concerns have been addressed Patient grounding bracelet on and cord attached to chamber Specifics for Inpatients (complete in addition to above) Medication sheet sent with patient NA Intravenous medications needed  or due during therapy sent with patient NA Drainage tubes (e.g. nasogastric tube or chest tube secured and vented) NA Endotracheal or Tracheotomy tube secured NA Cuff deflated of air and inflated with saline NA Airway suctioned NA Electronic Signature(s) Signed: 07/25/2021 10:27:47 AM By: [name] EMT Entered By: [name] on 07/25/2021 10:27:46

## 2021-07-25 NOTE — Progress Notes (Addendum)
KEISTON, MANLEY ([id number]) Visit Report for 07/25/2021 Arrival Information Details Patient Name: Date of Service: [name] 07/25/2021 8:00 A M Medical Record Number: [id number] Patient Account Number: [id number] Date of Birth/Sex: Treating RN: Apr 23, 1951 (71 y.o. [name], [name] Primary Care Reinaldo Helt: [name] Other Clinician: [name] Referring Darrie Macmillan: Treating Kashmere Staffa/Extender: [name] in Treatment: 10 Visit Information History Since Last Visit All ordered tests and consults were completed: Yes Patient Arrived: Ambulatory Added or deleted any medications: No Arrival Time: 07:37 Any new allergies or adverse reactions: No Accompanied By: None Had a fall or experienced change in No Transfer Assistance: None activities of daily living that may affect Patient Identification Verified: Yes risk of falls: Secondary Verification Process Completed: Yes Signs or symptoms of abuse/neglect since last visito No Patient Requires Transmission-Based Precautions: No Hospitalized since last visit: No Patient Has Alerts: No Implantable device outside of the clinic excluding No cellular tissue based products placed in the center since last visit: Pain Present Now: No Notes Paper version used prior to treatment start. Electronic Signature(s) Signed: 07/25/2021 10:25:47 AM By: [name] EMT Entered By: [name] on 07/25/2021 10:25:47 -------------------------------------------------------------------------------- Encounter Discharge Information Details Patient Name: Date of Service: [name] 07/25/2021 8:00 A M Medical Record Number: [id number] Patient Account Number: [id number] Date of Birth/Sex: Treating RN: 11-09-1950 (71 y.o. [name], [name] Primary Care Geoge Lawrance: [name] Other Clinician: [name] Referring Andrey Hoobler: Treating Edwyn Inclan/Extender: [name] in  Treatment: 10 Encounter Discharge Information Items Discharge Condition: Stable Ambulatory Status: Ambulatory Discharge Destination: Home Transportation: Private Auto Accompanied By: None Schedule Follow-up Appointment: Yes Clinical Summary of Care: Electronic Signature(s) Signed: 07/25/2021 10:34:32 AM By: [name] EMT Entered By: [name] on 07/25/2021 10:34:32 -------------------------------------------------------------------------------- Patient/Caregiver Education Details Patient Name: Date of Service: [name] 2/15/2023andnbsp8:00 Neligh Number: [id number] Patient Account Number: [id number] Date of Birth/Gender: Treating RN: 14-Feb-1951 (71 y.o. Erie Noe Primary Care Physician: [name] Other Clinician: [name] Referring Physician: Treating Physician/Extender: [name] in Treatment: 10 Education Assessment Education Provided To: Patient Education Topics Provided Electronic Signature(s) Signed: 07/25/2021 6:23:57 PM By: [name] EMT Entered By: [name] on 07/25/2021 10:34:11 -------------------------------------------------------------------------------- Vitals Details Patient Name: Date of Service: [name] 07/25/2021 8:00 A M Medical Record Number: [id number] Patient Account Number: [id number] Date of Birth/Sex: Treating RN: 03-31-1951 (71 y.o. [name], [name] Primary Care Athziri Freundlich: [name] Other Clinician: [name] Referring Nocholas Damaso: Treating Esmeralda Malay/Extender: [name] in Treatment: 10 Vital Signs Time Taken: 07:52 Temperature (F): 97.7 Height (in): 74 Pulse (bpm): 71 Weight (lbs): 315 Respiratory Rate (breaths/min): 18 Body Mass Index (BMI): 40.4 Blood Pressure (mmHg): 112/72 Reference Range: 80 - 120 mg / dl Notes Paper version used prior to treatment start. Electronic  Signature(s) Signed: 07/25/2021 10:26:25 AM By: [name] EMT Entered By: [name] on 07/25/2021 10:26:25

## 2021-07-25 NOTE — Progress Notes (Signed)
ELTON, HEID ([id number]) Visit Report for 07/25/2021 Problem List Details Patient Name: Date of Service: [name] [name] 07/25/2021 8:00 A M Medical Record Number: [id number] Patient Account Number: [id number] Date of Birth/Sex: Treating RN: 1950/08/22 (71 y.o. 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 05/15/2021 No Yes C61 Malignant neoplasm of prostate 05/15/2021 No Yes R30.0 Dysuria 05/15/2021 No Yes Z00.00 Encounter for general adult medical examination without abnormal findings 05/15/2021 No Yes Inactive Problems Resolved Problems Electronic Signature(s) Signed: 07/25/2021 10:33:48 AM By: [name] EMT Signed: 07/25/2021 4:36:32 PM By: Linton Ham MD Entered By: [name] on 07/25/2021 10:33:47 -------------------------------------------------------------------------------- SuperBill Details Patient Name: Date of Service: [name] [name] 07/25/2021 Medical Record Number: [id number] Patient Account Number: [id number] Date of Birth/Sex: Treating RN: 1950/10/03 (71 y.o. 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 C61 Malignant  neoplasm of prostate R30.0 Dysuria Z00.00 Encounter for general adult medical examination  without abnormal findings Modifier: Quantity: 4 Physician Procedures : CPT4 Code Description Modifier [id number] - WC PHYS HYPERBARIC OXYGEN THERAPY ICD-10 Diagnosis Description 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

## 2021-07-26 ENCOUNTER — Ambulatory Visit: Payer: Medicare Other | Admitting: Emergency Medicine

## 2021-07-26 ENCOUNTER — Encounter (HOSPITAL_BASED_OUTPATIENT_CLINIC_OR_DEPARTMENT_OTHER): Payer: Medicare Other | Admitting: Internal Medicine

## 2021-07-26 DIAGNOSIS — N3040 Irradiation cystitis without hematuria: Secondary | ICD-10-CM | POA: Diagnosis not present

## 2021-07-26 DIAGNOSIS — L598 Other specified disorders of the skin and subcutaneous tissue related to radiation: Secondary | ICD-10-CM | POA: Diagnosis not present

## 2021-07-26 DIAGNOSIS — R300 Dysuria: Secondary | ICD-10-CM | POA: Diagnosis not present

## 2021-07-26 NOTE — Progress Notes (Addendum)
[name], [name] ([id number]) Visit Report for 07/26/2021 HBO Details Patient Name: Date of Service: [name] [name] 07/26/2021 8:00 A M Medical Record Number: [id number] Patient Account Number: [id number] Date of Birth/Sex: Treating RN: [birth year] (71 y.o. [name] [name] Primary Care Verneice Caspers: [name] Other Clinician: [name] Referring Nakenya Theall: Treating Janai Maudlin/Extender: [name] in Treatment: 10 HBO Treatment Course Details Treatment Course Number: 1 Ordering Avigail Pilling: [name] T Treatments Ordered: otal 40 HBO Treatment Start Date: 05/29/2021 HBO Indication: Late Effect of Radiation HBO Treatment Details Treatment Number: 36 Patient Type: Outpatient Chamber Type: Monoplace Chamber Serial #: [id number] Treatment Protocol: 2.0 ATA with 90 minutes oxygen, and no air breaks Treatment Details Compression Rate Down: 1.0 psi / minute De-Compression Rate Up: 1.0 psi / minute Air breaks and breathing Decompress Decompress Compress Tx Pressure Begins Reached periods Begins Ends (leave unused spaces blank) Chamber Pressure (ATA 1 2 ------2 1 ) Clock Time (24 hr) 08:20 08:36 - - - - - - 10:06 10:22 Treatment Length: 122 (minutes) Treatment Segments: 4 Vital Signs Capillary Blood Glucose Reference Range: 80 - 120 mg / dl HBO Diabetic Blood Glucose Intervention Range: <131 mg/dl or >249 mg/dl Time Vitals Blood Respiratory Capillary Blood Glucose Pulse Action Type: Pulse: Temperature: Taken: Pressure: Rate: Glucose (mg/dl): Meter #: Oximetry (%) Taken: Pre 08:20 120/69 78 16 97.7 Post 10:25 06/5918 61 18 97.7 Treatment Response Treatment Toleration: Well Treatment Completion Status: Treatment Completed without Adverse Event Treatment Notes Paper version used prior to treatment start. No problems with treatment today. Grady Lucci Notes No concerns with treatment given Physician HBO Attestation: I certify that I  supervised this HBO treatment in accordance with Medicare guidelines. A trained emergency response team is readily available per Yes hospital policies and procedures. Continue HBOT as ordered. Yes Electronic Signature(s) Signed: 07/26/2021 5:55:17 PM By: Linton Ham MD Previous Signature: 07/26/2021 11:21:31 AM Version By: [name] EMT Entered By: Linton Ham on 07/26/2021 17:49:49 -------------------------------------------------------------------------------- HBO Safety Checklist Details Patient Name: Date of Service: [name] [name] 07/26/2021 8:00 A M Medical Record Number: [id number] Patient Account Number: [id number] Date of Birth/Sex: Treating RN: 05-01-1951 (71 y.o. [name] [name] Primary Care Earnie Bechard: [name] Other Clinician: [name] Referring Cherisa Brucker: Treating Jacobo Moncrief/Extender: Rudie Meyer Weeks in Treatment: 10 HBO Safety Checklist Items Safety Checklist Consent Form Signed Patient voided / foley secured and emptied When did you last eato 0715 Last dose of injectable or oral agent NA Ostomy pouch emptied and vented if applicable NA All implantable devices assessed, documented and approved NA Intravenous access site secured and place NA Valuables secured Linens and cotton and cotton/polyester blend (less than 51% polyester) Personal oil-based products / skin lotions / body lotions removed Wigs or hairpieces removed NA Smoking or tobacco materials removed Books / newspapers / magazines / loose paper removed Cologne, aftershave, perfume and deodorant removed Jewelry removed (may wrap wedding band) NA Make-up removed NA Hair care products removed NA Battery operated devices (external) removed Heating patches and chemical warmers removed Titanium eyewear removed NA Nail polish cured greater than 10 hours Casting material cured greater than 10 hours NA Hearing aids removed NA Loose dentures or  partials removed NA Prosthetics have been removed NA Patient demonstrates correct use of air break device (if applicable) Patient concerns have been addressed Patient grounding bracelet on and cord attached to chamber Specifics for Inpatients (complete in addition to above) Medication sheet sent with patient NA Intravenous medications needed or due  during therapy sent with patient NA Drainage tubes (e.g. nasogastric tube or chest tube secured and vented) NA Endotracheal or Tracheotomy tube secured NA Cuff deflated of air and inflated with saline NA Airway suctioned NA Notes Paper version used prior to treatment start. Electronic Signature(s) Signed: 07/26/2021 9:50:15 AM By: [name] EMT Entered By: [name] on 07/26/2021 09:50:15

## 2021-07-26 NOTE — Progress Notes (Addendum)
DYRELL, TUCCILLO ([id number]) Visit Report for 07/26/2021 Arrival Information Details Patient Name: Date of Service: [name] 07/26/2021 8:00 A M Medical Record Number: [id number] Patient Account Number: [id number] Date of Birth/Sex: Treating RN: [birth year] (71 y.o. [name] Primary Care Yurika Pereda: [name] Other Clinician: [name] Referring Anivea Velasques: Treating Joden Bonsall/Extender: [name] in Treatment: 10 Visit Information History Since Last Visit All ordered tests and consults were completed: Yes Patient Arrived: Ambulatory Added or deleted any medications: No Arrival Time: 08:03 Any new allergies or adverse reactions: No Accompanied By: None Had a fall or experienced change in No Transfer Assistance: None activities of daily living that may affect Patient Identification Verified: Yes risk of falls: Secondary Verification Process Completed: Yes Signs or symptoms of abuse/neglect since last visito No Patient Requires Transmission-Based Precautions: No Hospitalized since last visit: No Patient Has Alerts: No Implantable device outside of the clinic excluding No cellular tissue based products placed in the center since last visit: Pain Present Now: No Notes Paper version used prior to treatment start. Electronic Signature(s) Signed: 07/26/2021 9:48:29 AM By: [name] EMT Entered By: [name] on 07/26/2021 09:48:29 -------------------------------------------------------------------------------- Encounter Discharge Information Details Patient Name: Date of Service: [name] 07/26/2021 8:00 A M Medical Record Number: [id number] Patient Account Number: [id number] Date of Birth/Sex: Treating RN: [birth year] (71 y.o. [name] Primary Care Geral Coker: [name] Other Clinician: [name] Referring Tayden Nichelson: Treating Aneisha Skyles/Extender: [name] in  Treatment: 10 Encounter Discharge Information Items Discharge Condition: Stable Ambulatory Status: Ambulatory Discharge Destination: Home Transportation: Private Auto Accompanied By: None Schedule Follow-up Appointment: Yes Clinical Summary of Care: Electronic Signature(s) Signed: 07/26/2021 11:23:11 AM By: [name] EMT Entered By: [name] on 07/26/2021 11:23:10 -------------------------------------------------------------------------------- Patient/Caregiver Education Details Patient Name: Date of Service: [name] 2/16/2023andnbsp8:00 A M Medical Record Number: [id number] Patient Account Number: [id number] Date of Birth/Gender: Treating RN: 10/21/1950 (71 y.o. [name] Primary Care Physician: [name] Other Clinician: [name] Referring Physician: Treating Physician/Extender: [name] in Treatment: 10 Education Assessment Education Provided To: Patient Education Topics Provided Electronic Signature(s) Signed: 07/27/2021 8:47:42 AM By: [name] EMT Entered By: [name] on 07/26/2021 11:22:50 -------------------------------------------------------------------------------- Vitals Details Patient Name: Date of Service: [name] 07/26/2021 8:00 A M Medical Record Number: [id number] Patient Account Number: [id number] Date of Birth/Sex: Treating RN: 08/29/1950 (71 y.o. [name] Primary Care Ivana Nicastro: [name] Other Clinician: [name] Referring Haruki Arnold: Treating Yvette Loveless/Extender: [name] in Treatment: 10 Vital Signs Time Taken: 08:20 Temperature (F): 97.7 Height (in): 74 Pulse (bpm): 78 Weight (lbs): 315 Respiratory Rate (breaths/min): 16 Body Mass Index (BMI): 40.4 Blood Pressure (mmHg): 120/69 Reference Range: 80 - 120 mg / dl Notes Paper version used prior to treatment start. Electronic Signature(s) Signed:  07/26/2021 9:49:04 AM By: [name] EMT Entered By: [name] on 07/26/2021 09:49:03

## 2021-07-26 NOTE — Progress Notes (Signed)
FISCHER, HALLEY ([id number]) Visit Report for 07/26/2021 Problem List Details Patient Name: Date of Service: [name] [name] 07/26/2021 8:00 A M Medical Record Number: [id number] Patient Account Number: [id number] Date of Birth/Sex: Treating RN: 06-23-1950 (71 y.o. 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 05/15/2021 No Yes C61 Malignant neoplasm of prostate 05/15/2021 No Yes R30.0 Dysuria 05/15/2021 No Yes Z00.00 Encounter for general adult medical examination without abnormal findings 05/15/2021 No Yes Inactive Problems Resolved Problems Electronic Signature(s) Signed: 07/26/2021 11:21:59 AM By: [name] EMT Signed: 07/26/2021 5:55:17 PM By: Linton Ham MD Entered By: [name] on 07/26/2021 11:21:59 -------------------------------------------------------------------------------- SuperBill Details Patient Name: Date of Service: [name] [name] 07/26/2021 Medical Record Number: [id number] Patient Account Number: [id number] Date of Birth/Sex: Treating RN: 04-26-1951 (71 y.o. 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 C61 Malignant neoplasm  of prostate R30.0 Dysuria Z00.00 Encounter for general adult medical examination  without abnormal findings Modifier: Quantity: 4 Physician Procedures : CPT4 Code Description Modifier [id number] - WC PHYS HYPERBARIC OXYGEN THERAPY ICD-10 Diagnosis Description 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

## 2021-07-27 ENCOUNTER — Encounter (HOSPITAL_BASED_OUTPATIENT_CLINIC_OR_DEPARTMENT_OTHER): Payer: Medicare Other | Admitting: Internal Medicine

## 2021-07-27 ENCOUNTER — Other Ambulatory Visit: Payer: Self-pay

## 2021-07-27 DIAGNOSIS — R300 Dysuria: Secondary | ICD-10-CM

## 2021-07-27 DIAGNOSIS — N3040 Irradiation cystitis without hematuria: Secondary | ICD-10-CM | POA: Diagnosis not present

## 2021-07-27 DIAGNOSIS — C61 Malignant neoplasm of prostate: Secondary | ICD-10-CM

## 2021-07-27 NOTE — Progress Notes (Signed)
[name] [name], [name] [name] ([id number]) Visit Report for 07/27/2021 Problem List Details Patient Name: Date of Service: [name] 07/27/2021 7:30 A M Medical Record Number: [id number] Patient Account Number: [id number] Date of Birth/Sex: Treating RN: [birth year] (71 y.o. 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 05/15/2021 No Yes C61 Malignant neoplasm of prostate 05/15/2021 No Yes R30.0 Dysuria 05/15/2021 No Yes Z00.00 Encounter for general adult medical examination without abnormal findings 05/15/2021 No Yes Inactive Problems Resolved Problems Electronic Signature(s) Signed: 07/27/2021 11:18:22 AM By: [name] EMT Signed: 07/27/2021 12:32:44 PM By: [name] DO Entered By: [name] on 07/27/2021 11:18:22 -------------------------------------------------------------------------------- SuperBill Details Patient Name: Date of Service: [name] 07/27/2021 Medical Record Number: [id number] Patient Account Number: [id number] Date of Birth/Sex: Treating RN: June 02, 1951 (71 y.o. 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 C61 Malignant  neoplasm of prostate R30.0 Dysuria Z00.00 Encounter for general adult medical examination  without abnormal findings Modifier: Quantity: 4 Physician Procedures : CPT4 Code Description Modifier [id number] - WC PHYS HYPERBARIC OXYGEN THERAPY ICD-10 Diagnosis Description 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

## 2021-07-27 NOTE — Progress Notes (Addendum)
JUDSON, TSAN ([id number]) Visit Report for 07/27/2021 Arrival Information Details Patient Name: Date of Service: [name] [name] 07/27/2021 7:30 A M Medical Record Number: [id number] Patient Account Number: [id number] Date of Birth/Sex: Treating RN: Dec 22, 1950 (71 y.o. [name] Primary Care Nija Koopman: [name] Other Clinician: [name] Referring Ranen Doolin: Treating Kalasia Crafton/Extender: [name] in Treatment: 10 Visit Information History Since Last Visit All ordered tests and consults were completed: Yes Patient Arrived: Ambulatory Added or deleted any medications: No Arrival Time: 07:43 Any new allergies or adverse reactions: No Accompanied By: None Had a fall or experienced change in No Transfer Assistance: None activities of daily living that may affect Patient Identification Verified: Yes risk of falls: Secondary Verification Process Completed: Yes Signs or symptoms of abuse/neglect since last visito No Patient Requires Transmission-Based Precautions: No Hospitalized since last visit: No Patient Has Alerts: No Implantable device outside of the clinic excluding No cellular tissue based products placed in the center since last visit: Pain Present Now: No Notes Paper version used prior to treatment start. Electronic Signature(s) Signed: 07/27/2021 8:50:40 AM By: [name] EMT Previous Signature: 07/27/2021 8:48:48 AM Version By: [name] EMT Entered By: [name] on 07/27/2021 08:50:40 -------------------------------------------------------------------------------- Encounter Discharge Information Details Patient Name: Date of Service: [name] [name] 07/27/2021 7:30 A M Medical Record Number: [id number] Patient Account Number: [id number] Date of Birth/Sex: Treating RN: 1951-01-20 (71 y.o. [name] Primary Care Taggert Bozzi: [name] Other Clinician: [name] Referring  Baeleigh Devincent: Treating Ernestina Joe/Extender: [name] in Treatment: 10 Encounter Discharge Information Items Discharge Condition: Stable Ambulatory Status: Ambulatory Discharge Destination: Home Transportation: Private Auto Accompanied By: None Schedule Follow-up Appointment: Yes Clinical Summary of Care: Electronic Signature(s) Signed: 07/27/2021 11:21:01 AM By: [name] EMT Entered By: [name] on 07/27/2021 11:21:01 -------------------------------------------------------------------------------- Patient/Caregiver Education Details Patient Name: Date of Service: [name] [name] 2/17/2023andnbsp7:30 [place] Record Number: [id number] Patient Account Number: [id number] Date of Birth/Gender: Treating RN: [birth year] (71 y.o. [name] Primary Care Physician: [name] Other Clinician: [name] Referring Physician: Treating Physician/Extender: [name] in Treatment: 10 Education Assessment Education Provided To: Patient Education Topics Provided Electronic Signature(s) Signed: 07/27/2021 3:20:23 PM By: [name] EMT Entered By: [name] on 07/27/2021 11:19:21 -------------------------------------------------------------------------------- Vitals Details Patient Name: Date of Service: [name] [name] 07/27/2021 7:30 A M Medical Record Number: [id number] Patient Account Number: [id number] Date of Birth/Sex: Treating RN: [birth year] (71 y.o. [name] Primary Care Chistopher Mangino: [name] Other Clinician: [name] Referring Hines Kloss: Treating Lakshmi Sundeen/Extender: [name]: 10 Vital Signs Time Taken: 07:57 Temperature (F): 97.7 Height (in): 74 Pulse (bpm): 71 Weight (lbs): 315 Respiratory Rate (breaths/min): 16 Body Mass Index (BMI): 40.4 Blood Pressure (mmHg): 128/79 Reference Range: 80 - 120 mg /  dl Notes Paper version used prior to treatment start. Electronic Signature(s) Signed: 07/27/2021 8:50:16 AM By: [name] EMT Entered By: [name] on 07/27/2021 08:50:16

## 2021-07-27 NOTE — Progress Notes (Addendum)
PACE, LAMADRID ([id number]) Visit Report for 07/27/2021 HBO Details Patient Name: Date of Service: [name] [name] 07/27/2021 7:30 A M Medical Record Number: [id number] Patient Account Number: [id number] Date of Birth/Sex: Treating RN: [birth year] (71 y.o. [name] Primary Care [name]: [name] Other Clinician: [name] Referring [name]: Treating [name]: [name]: 10 HBO Treatment Course Details Treatment Course Number: 1 Ordering [name]: [name] T Treatments Ordered: otal 40 HBO Treatment Start Date: 05/29/2021 HBO Indication: Late Effect of Radiation HBO Treatment Details Treatment Number: 37 Patient Type: Outpatient Chamber Type: Monoplace Chamber Serial #: [id number] Treatment Protocol: 2.0 ATA with 90 minutes oxygen, and no air breaks Treatment Details Compression Rate Down: 1.0 psi / minute De-Compression Rate Up: 1.0 psi / minute Air breaks and breathing Decompress Decompress Compress Tx Pressure Begins Reached periods Begins Ends (leave unused spaces blank) Chamber Pressure (ATA 1 2 ------2 1 ) Clock Time (24 hr) 08:22 08:40 - - - - - - 10:10 10:22 Treatment Length: 120 (minutes) Treatment Segments: 4 Vital Signs Capillary Blood Glucose Reference Range: 80 - 120 mg / dl HBO Diabetic Blood Glucose Intervention Range: <131 mg/dl or >249 mg/dl Time Vitals Blood Respiratory Capillary Blood Glucose Pulse Action Type: Pulse: Temperature: Taken: Pressure: Rate: Glucose (mg/dl): Meter #: Oximetry (%) Taken: Pre 07:57 128/79 71 16 97.7 Post 10:25 114/81 65 18 97.9 Treatment Response Treatment Toleration: Well Treatment Completion Status: Treatment Completed without Adverse Event Treatment Notes Paper version used prior to treatment start. The patient had no problems today. Physician HBO Attestation: I certify that I supervised this HBO treatment in accordance with  Medicare guidelines. A trained emergency response team is readily available per Yes hospital policies and procedures. Continue HBOT as ordered. Yes Electronic Signature(s) Signed: 07/27/2021 12:32:44 PM By: [name] DO Previous Signature: 07/27/2021 11:14:21 AM Version By: [name] EMT Entered By: [name] on 07/27/2021 12:31:32 -------------------------------------------------------------------------------- HBO Safety Checklist Details Patient Name: Date of Service: [name] [name] 07/27/2021 7:30 A M Medical Record Number: [id number] Patient Account Number: [id number] Date of Birth/Sex: Treating RN: June 21, 1950 (71 y.o. [name] Primary Care [name]: [name] Other Clinician: [name] Referring Nicolai Labonte: Treating Jazmyne Beauchesne/Extender: [name]: 10 HBO Safety Checklist Items Safety Checklist Consent Form Signed Patient voided / foley secured and emptied When did you last eato 0600 Last dose of injectable or oral agent NA Ostomy pouch emptied and vented if applicable NA All implantable devices assessed, documented and approved NA Intravenous access site secured and place NA Valuables secured Linens and cotton and cotton/polyester blend (less than 51% polyester) Personal oil-based products / skin lotions / body lotions removed Wigs or hairpieces removed NA Smoking or tobacco materials removed Books / newspapers / magazines / loose paper removed Cologne, aftershave, perfume and deodorant removed Jewelry removed (may wrap wedding band) NA Make-up removed NA Hair care products removed Battery operated devices (external) removed Heating patches and chemical warmers removed Titanium eyewear removed NA Nail polish cured greater than 10 hours Casting material cured greater than 10 hours NA Hearing aids removed NA Loose dentures or partials removed NA Prosthetics have been  removed NA Patient demonstrates correct use of air break device (if applicable) Patient concerns have been addressed Patient grounding bracelet on and cord attached to chamber Specifics for Inpatients (complete in addition to above) Medication sheet sent with patient NA Intravenous medications needed or due during therapy sent with patient NA Drainage  tubes (e.g. nasogastric tube or chest tube secured and vented) NA Endotracheal or Tracheotomy tube secured NA Cuff deflated of air and inflated with saline NA Airway suctioned NA Electronic Signature(s) Signed: 07/27/2021 8:52:09 AM By: [name] EMT Entered By: [name] on 07/27/2021 08:52:09

## 2021-07-30 ENCOUNTER — Telehealth: Payer: Medicare Other

## 2021-07-30 ENCOUNTER — Ambulatory Visit (INDEPENDENT_AMBULATORY_CARE_PROVIDER_SITE_OTHER): Payer: Medicare Other | Admitting: Emergency Medicine

## 2021-07-30 ENCOUNTER — Other Ambulatory Visit: Payer: Self-pay

## 2021-07-30 ENCOUNTER — Encounter: Payer: Self-pay | Admitting: Emergency Medicine

## 2021-07-30 ENCOUNTER — Telehealth: Payer: Self-pay | Admitting: *Deleted

## 2021-07-30 ENCOUNTER — Encounter (HOSPITAL_BASED_OUTPATIENT_CLINIC_OR_DEPARTMENT_OTHER): Payer: Medicare Other | Admitting: Internal Medicine

## 2021-07-30 ENCOUNTER — Encounter: Payer: Self-pay | Admitting: *Deleted

## 2021-07-30 VITALS — BP 130/70 | HR 67 | Temp 98.2°F | Ht 74.0 in | Wt 318.0 lb

## 2021-07-30 DIAGNOSIS — N3040 Irradiation cystitis without hematuria: Secondary | ICD-10-CM | POA: Diagnosis not present

## 2021-07-30 DIAGNOSIS — E785 Hyperlipidemia, unspecified: Secondary | ICD-10-CM | POA: Diagnosis not present

## 2021-07-30 DIAGNOSIS — C61 Malignant neoplasm of prostate: Secondary | ICD-10-CM

## 2021-07-30 DIAGNOSIS — R300 Dysuria: Secondary | ICD-10-CM

## 2021-07-30 DIAGNOSIS — I10 Essential (primary) hypertension: Secondary | ICD-10-CM

## 2021-07-30 DIAGNOSIS — R7303 Prediabetes: Secondary | ICD-10-CM

## 2021-07-30 LAB — CBC WITH DIFFERENTIAL/PLATELET
Basophils Absolute: 0 10*3/uL (ref 0.0–0.1)
Basophils Relative: 0.4 % (ref 0.0–3.0)
Eosinophils Absolute: 0.2 10*3/uL (ref 0.0–0.7)
Eosinophils Relative: 4.3 % (ref 0.0–5.0)
HCT: 35.5 % — ABNORMAL LOW (ref 39.0–52.0)
Hemoglobin: 11.6 g/dL — ABNORMAL LOW (ref 13.0–17.0)
Lymphocytes Relative: 27.4 % (ref 12.0–46.0)
Lymphs Abs: 1.2 10*3/uL (ref 0.7–4.0)
MCHC: 32.8 g/dL (ref 30.0–36.0)
MCV: 81.8 fl (ref 78.0–100.0)
Monocytes Absolute: 0.3 10*3/uL (ref 0.1–1.0)
Monocytes Relative: 7.2 % (ref 3.0–12.0)
Neutro Abs: 2.8 10*3/uL (ref 1.4–7.7)
Neutrophils Relative %: 60.7 % (ref 43.0–77.0)
Platelets: 172 10*3/uL (ref 150.0–400.0)
RBC: 4.33 Mil/uL (ref 4.22–5.81)
RDW: 16.4 % — ABNORMAL HIGH (ref 11.5–15.5)
WBC: 4.5 10*3/uL (ref 4.0–10.5)

## 2021-07-30 LAB — LDL CHOLESTEROL, DIRECT: Direct LDL: 79 mg/dL

## 2021-07-30 LAB — COMPREHENSIVE METABOLIC PANEL
ALT: 19 U/L (ref 0–53)
AST: 19 U/L (ref 0–37)
Albumin: 4.3 g/dL (ref 3.5–5.2)
Alkaline Phosphatase: 56 U/L (ref 39–117)
BUN: 23 mg/dL (ref 6–23)
CO2: 34 mEq/L — ABNORMAL HIGH (ref 19–32)
Calcium: 10.5 mg/dL (ref 8.4–10.5)
Chloride: 106 mEq/L (ref 96–112)
Creatinine, Ser: 1.25 mg/dL (ref 0.40–1.50)
GFR: 58.95 mL/min — ABNORMAL LOW (ref >60.00)
Glucose, Bld: 100 mg/dL — ABNORMAL HIGH (ref 70–99)
Potassium: 3.9 mEq/L (ref 3.5–5.1)
Sodium: 141 mEq/L (ref 135–145)
Total Bilirubin: 0.3 mg/dL (ref 0.2–1.2)
Total Protein: 7.4 g/dL (ref 6.0–8.3)

## 2021-07-30 LAB — LIPID PANEL
Cholesterol: 157 mg/dL (ref 0–200)
HDL: 34.9 mg/dL — ABNORMAL LOW (ref >39.00)
NonHDL: 122.51
Total CHOL/HDL Ratio: 5
Triglycerides: 210 mg/dL — ABNORMAL HIGH (ref 0.0–149.0)
VLDL: 42 mg/dL — ABNORMAL HIGH (ref 0.0–40.0)

## 2021-07-30 LAB — HEMOGLOBIN A1C: Hgb A1c MFr Bld: 6.6 % — ABNORMAL HIGH (ref 4.6–6.5)

## 2021-07-30 NOTE — Patient Instructions (Signed)
Health Maintenance After Age 69 After age 69, you are at a higher risk for certain long-term diseases and infections as well as injuries from falls. Falls are a major cause of broken bones and head injuries in people who are older than age 69. Getting regular preventive care can help to keep you healthy and well. Preventive care includes getting regular testing and making lifestyle changes as recommended by your health care provider. Talk with your health care provider about: Which screenings and tests you should have. A screening is a test that checks for a disease when you have no symptoms. A diet and exercise plan that is right for you. What should I know about screenings and tests to prevent falls? Screening and testing are the best ways to find a health problem early. Early diagnosis and treatment give you the best chance of managing medical conditions that are common after age 69. Certain conditions and lifestyle choices may make you more likely to have a fall. Your health care provider may recommend: Regular vision checks. Poor vision and conditions such as cataracts can make you more likely to have a fall. If you wear glasses, make sure to get your prescription updated if your vision changes. Medicine review. Work with your health care provider to regularly review all of the medicines you are taking, including over-the-counter medicines. Ask your health care provider about any side effects that may make you more likely to have a fall. Tell your health care provider if any medicines that you take make you feel dizzy or sleepy. Strength and balance checks. Your health care provider may recommend certain tests to check your strength and balance while standing, walking, or changing positions. Foot health exam. Foot pain and numbness, as well as not wearing proper footwear, can make you more likely to have a fall. Screenings, including: Osteoporosis screening. Osteoporosis is a condition that causes  the bones to get weaker and break more easily. Blood pressure screening. Blood pressure changes and medicines to control blood pressure can make you feel dizzy. Depression screening. You may be more likely to have a fall if you have a fear of falling, feel depressed, or feel unable to do activities that you used to do. Alcohol use screening. Using too much alcohol can affect your balance and may make you more likely to have a fall. Follow these instructions at home: Lifestyle Do not drink alcohol if: Your health care provider tells you not to drink. If you drink alcohol: Limit how much you have to: 0-1 drink a day for women. 0-2 drinks a day for men. Know how much alcohol is in your drink. In the [place] one drink equals one 12 oz bottle of beer (355 mL), one 5 oz glass of wine (148 mL), or one 1 oz glass of hard liquor (44 mL). Do not use any products that contain nicotine or tobacco. These products include cigarettes, chewing tobacco, and vaping devices, such as e-cigarettes. If you need help quitting, ask your health care provider. Activity  Follow a regular exercise program to stay fit. This will help you maintain your balance. Ask your health care provider what types of exercise are appropriate for you. If you need a cane or walker, use it as recommended by your health care provider. Wear supportive shoes that have nonskid soles. Safety  Remove any tripping hazards, such as rugs, cords, and clutter. Install safety equipment such as grab bars in bathrooms and safety rails on stairs. Keep rooms and walkways   well-lit. General instructions Talk with your health care provider about your risks for falling. Tell your health care provider if: You fall. Be sure to tell your health care provider about all falls, even ones that seem minor. You feel dizzy, tiredness (fatigue), or off-balance. Take over-the-counter and prescription medicines only as told by your health care provider. These include  supplements. Eat a healthy diet and maintain a healthy weight. A healthy diet includes low-fat dairy products, low-fat (lean) meats, and fiber from whole grains, beans, and lots of fruits and vegetables. Stay current with your vaccines. Schedule regular health, dental, and eye exams. Summary Having a healthy lifestyle and getting preventive care can help to protect your health and wellness after age 69. Screening and testing are the best way to find a health problem early and help you avoid having a fall. Early diagnosis and treatment give you the best chance for managing medical conditions that are more common for people who are older than age 69. Falls are a major cause of broken bones and head injuries in people who are older than age 69. Take precautions to prevent a fall at home. Work with your health care provider to learn what changes you can make to improve your health and wellness and to prevent falls. This information is not intended to replace advice given to you by your health care provider. Make sure you discuss any questions you have with your health care provider. Document Revised: 10/16/2020 Document Reviewed: 10/16/2020 Elsevier Patient Education  2022 [employer]  

## 2021-07-30 NOTE — Progress Notes (Addendum)
KIERNAN, ATKERSON ([id number]) Visit Report for 07/30/2021 HBO Details Patient Name: Date of Service: [name] [name] 07/30/2021 9:45 A M Medical Record Number: [id number] Patient Account Number: [id number] Date of Birth/Sex: Treating RN: [birth year] (71 y.o. [name] Primary Care Jette Lewan: [name] Other Clinician: [name] Referring Jolynda Townley: Treating Devell Parkerson/Extender: [name]: 10 HBO Treatment Course Details Treatment Course Number: 1 Ordering Afton Lavalle: [name] T Treatments Ordered: otal 40 HBO Treatment Start Date: 05/29/2021 HBO Indication: Late Effect of Radiation HBO Treatment Details Treatment Number: 38 Patient Type: Outpatient Chamber Type: Monoplace Chamber Serial #: [id number] Treatment Protocol: 2.0 ATA with 90 minutes oxygen, and no air breaks Treatment Details Compression Rate Down: 1.0 psi / minute De-Compression Rate Up: 1.0 psi / minute Air breaks and breathing Decompress Decompress Compress Tx Pressure Begins Reached periods Begins Ends (leave unused spaces blank) Chamber Pressure (ATA 1 2 ------2 1 ) Clock Time (24 hr) 08:27 08:44 - - - - - - 10:14 10:29 Treatment Length: 122 (minutes) Treatment Segments: 4 Vital Signs Capillary Blood Glucose Reference Range: 80 - 120 mg / dl HBO Diabetic Blood Glucose Intervention Range: <131 mg/dl or >249 mg/dl Time Vitals Blood Respiratory Capillary Blood Glucose Pulse Action Type: Pulse: Temperature: Taken: Pressure: Rate: Glucose (mg/dl): Meter #: Oximetry (%) Taken: Pre 08:14 115/70 71 16 94.6 Post 10:33 130/69 71 16 97.2 Treatment Response Treatment Toleration: Well Treatment Completion Status: Treatment Completed without Adverse Event Treatment Notes Paper version used prior to treatment start. The patient had no problems with his treatment today. Physician HBO Attestation: I certify that I supervised this HBO treatment in  accordance with Medicare guidelines. A trained emergency response team is readily available per Yes hospital policies and procedures. Continue HBOT as ordered. Yes Electronic Signature(s) Signed: 07/30/2021 2:02:39 PM By: [name] DO Previous Signature: 07/30/2021 11:33:47 AM Version By: [name] EMT Previous Signature: 07/30/2021 11:23:11 AM Version By: [name] EMT Entered By: [name] on 07/30/2021 14:01:34 -------------------------------------------------------------------------------- HBO Safety Checklist Details Patient Name: Date of Service: [name] [name] 07/30/2021 9:45 A M Medical Record Number: [id number] Patient Account Number: [id number] Date of Birth/Sex: Treating RN: July 20, 1950 (71 y.o. [name] Primary Care Jacqulyn Barresi: [name] Other Clinician: [name] Referring Drishti Pepperman: Treating Odarius Dines/Extender: [name]: 10 HBO Safety Checklist Items Safety Checklist Consent Form Signed Patient voided / foley secured and emptied When did you last eato 0715 Last dose of injectable or oral agent NA Ostomy pouch emptied and vented if applicable NA All implantable devices assessed, documented and approved NA Intravenous access site secured and place NA Valuables secured Linens and cotton and cotton/polyester blend (less than 51% polyester) Personal oil-based products / skin lotions / body lotions removed Wigs or hairpieces removed NA Smoking or tobacco materials removed Books / newspapers / magazines / loose paper removed Cologne, aftershave, perfume and deodorant removed Jewelry removed (may wrap wedding band) NA Make-up removed NA Hair care products removed Battery operated devices (external) removed Heating patches and chemical warmers removed Titanium eyewear removed NA Nail polish cured greater than 10 hours Casting material cured greater than 10 hours NA Hearing  aids removed NA Loose dentures or partials removed NA Prosthetics have been removed NA Patient demonstrates correct use of air break device (if applicable) Patient concerns have been addressed Patient grounding bracelet on and cord attached to chamber Specifics for Inpatients (complete in addition to above) Medication sheet sent with patient  NA Intravenous medications needed or due during therapy sent with patient NA Drainage tubes (e.g. nasogastric tube or chest tube secured and vented) NA Endotracheal or Tracheotomy tube secured NA Cuff deflated of air and inflated with saline NA Airway suctioned NA Notes Paper version used prior to treatment start. Electronic Signature(s) Signed: 07/30/2021 11:33:26 AM By: [name] EMT Previous Signature: 07/30/2021 11:21:02 AM Version By: [name] EMT Entered By: [name] on 07/30/2021 11:33:25

## 2021-07-30 NOTE — Assessment & Plan Note (Signed)
Stable.  Follows up with oncologist on a regular basis.  Urinary symptoms well controlled.

## 2021-07-30 NOTE — Progress Notes (Signed)
[name] [name] 71 y.o.   Chief Complaint  Patient presents with   Hypertension    F/U    HISTORY OF PRESENT ILLNESS: This is a 71 y.o. male with a history of hypertension, dyslipidemia, prediabetes here for follow-up. BP Readings from Last 3 Encounters:  01/23/21 132/70  01/09/21 (!) 142/81  11/16/20 138/80   Lab Results  Component Value Date   HGBA1C 6.4 01/23/2021   Lab Results  Component Value Date   CHOL 178 01/23/2021   HDL 34.10 (L) 01/23/2021   LDLCALC 110 (H) 01/10/2020   LDLDIRECT 102.0 01/23/2021   TRIG 206.0 (H) 01/23/2021   CHOLHDL 5 01/23/2021     Hypertension Pertinent negatives include no chest pain, headaches, palpitations or shortness of breath.    Prior to Admission medications   Medication Sig Start Date End Date Taking? Authorizing Provider  ALPRAZolam Duanne Moron) 0.5 MG tablet Take 1 tablet (0.5 mg total) by mouth daily as needed for anxiety. 07/12/20   Horald Pollen, MD  lisinopril-hydrochlorothiazide (ZESTORETIC) 20-12.5 MG tablet Take 1 tablet by mouth daily. 11/16/20   Horald Pollen, MD  multivitamin (ONE-A-DAY MEN'S) TABS tablet Take 1 tablet by mouth daily.    [provider]  oxybutynin (DITROPAN) 5 MG tablet Take 5 mg by mouth at bedtime. 08/10/20   [provider]  rosuvastatin (CRESTOR) 10 MG tablet TAKE 1 TABLET BY MOUTH EVERY DAY 02/02/21   Horald Pollen, MD  triamcinolone (NASACORT) 55 MCG/ACT AERO nasal inhaler PLACE 2 SPRAYS INTO THE NOSE DAILY. 06/04/20   Horald Pollen, MD    Allergies  Allergen Reactions   Aspirin     REACTION: upsets stomach   Statins     REACTION: UPSETS STOMACH   Gadolinium Derivatives Nausea And Vomiting    Pt was given 20 ml multihance and became nauseated and vomited several times.     Patient Active Problem List   Diagnosis Date Noted   Body mass index (BMI) of 38.0-38.9 in adult 01/10/2020   Diverticulosis 01/10/2020   Prediabetes 01/10/2020    Dyslipidemia 01/10/2020   Malignant neoplasm of prostate ([place]) 06/08/2019   Prostate enlargement 08/29/2017   Gout 10/25/2014   Essential hypertension    Benign prostatic hyperplasia    Kidney stones    Prostate nodule     Past Medical History:  Diagnosis Date   Allergy    seasonal allergies   Anxiety    at times   Arthritis    generalized   Blood transfusion without reported diagnosis 2013   when had diverticulitis flare   BPH (benign prostatic hypertrophy)    Colon polyp    diverticular bleed with transfusion   Diverticulosis    Hyperlipidemia    on meds   Hypertension    on meds   Kidney stones    hx of 15-20 kidney stones    Obesity, unspecified    Prostate cancer ([place]) 2020   dx 05/2019   Prostate nodule [id number]    Past Surgical History:  Procedure Laterality Date   ANKLE FUSION Left 1991   COLONOSCOPY  2011   DB-F/V-miralax(good)-TICS/3 HPP   HERNIA REPAIR N/A    umbilical    POLYPECTOMY  2011   3 HPP   TRANSURETHRAL RESECTION OF PROSTATE  2020    Social History   Socioeconomic History   Marital status: Married    Spouse name: Not on file   Number of children: 2   Years of education: Not on  file   Highest education level: Not on file  Occupational History   Not on file  Tobacco Use   Smoking status: Never   Smokeless tobacco: Never  Vaping Use   Vaping Use: Never used  Substance and Sexual Activity   Alcohol use: No   Drug use: No   Sexual activity: Yes  Other Topics Concern   Not on file  Social History Narrative   Not on file   Social Determinants of Health   Financial Resource Strain: Low Risk    Difficulty of Paying Living Expenses: Not hard at all  Food Insecurity: No Food Insecurity   Worried About Charity fundraiser in the Last Year: Never true   [place] in the Last Year: Never true  Transportation Needs: No Transportation Needs   Lack of Transportation (Medical): No   Lack of Transportation (Non-Medical): No   Physical Activity: Not on file  Stress: Not on file  Social Connections: Not on file  Intimate Partner Violence: Not on file    Family History  Problem Relation Age of Onset   Diabetes Mother    Breast cancer Mother 64   Prostate cancer Father        patient did not have a relationship with his father but understands he had prostate ca   Colon polyps Father 12   Colon cancer Father 19   Diabetes Sister    Pancreatic cancer Neg Hx    Esophageal cancer Neg Hx    Stomach cancer Neg Hx    Rectal cancer Neg Hx      Review of Systems  Constitutional: Negative.  Negative for chills and fever.  HENT: Negative.  Negative for congestion and sore throat.   Respiratory: Negative.  Negative for cough and shortness of breath.   Cardiovascular: Negative.  Negative for chest pain and palpitations.  Gastrointestinal: Negative.  Negative for abdominal pain, diarrhea, nausea and vomiting.  Genitourinary: Negative.  Negative for dysuria and hematuria.  Skin: Negative.  Negative for rash.  Neurological: Negative.  Negative for dizziness and headaches.  All other systems reviewed and are negative.  Today's Vitals   07/30/21 1325  BP: 140/68  Pulse: 67  Temp: 98.2 F (36.8 C)  TempSrc: Oral  SpO2: 94%  Weight: (!) 318 lb (144.2 kg)  Height: 6\' 2"  (1.88 m)   Body mass index is 40.83 kg/m.  Physical Exam Constitutional:      Appearance: Normal appearance.  Cardiovascular:     Rate and Rhythm: Normal rate and regular rhythm.     Pulses: Normal pulses.     Heart sounds: Normal heart sounds.  Pulmonary:     Effort: Pulmonary effort is normal.     Breath sounds: Normal breath sounds.  Musculoskeletal:     Cervical back: No tenderness.  Lymphadenopathy:     Cervical: No cervical adenopathy.  Skin:    General: Skin is warm and dry.  Neurological:     General: No focal deficit present.     Mental Status: He is alert and oriented to person, place, and time.  Psychiatric:         Mood and Affect: Mood normal.        Behavior: Behavior normal.     ASSESSMENT & PLAN: Problem List Items Addressed This Visit       Cardiovascular and Mediastinum   Essential hypertension - Primary    Well-controlled hypertension with normal blood pressure readings at home.  Continue  Zestoretic 20-12.5 mg daily. Dietary approaches to stop hypertension discussed.      Relevant Orders   CBC with Differential/Platelet   Comprehensive metabolic panel     Genitourinary   Malignant neoplasm of prostate ([place])    Stable.  Follows up with oncologist on a regular basis.  Urinary symptoms well controlled.      Relevant Medications   cephALEXin (KEFLEX) 250 MG capsule   LORazepam (ATIVAN) 1 MG tablet     Other   Prediabetes    Diet and nutrition discussed.  Advised to decrease amount of daily carbohydrate intake.      Relevant Orders   CBC with Differential/Platelet   Hemoglobin A1c   Dyslipidemia    Diet and nutrition discussed.  Continue rosuvastatin 10 mg daily. The 10-year ASCVD risk score (Arnett DK, et al., 2019) is: 20.3%   Values used to calculate the score:     Age: 24 years     Sex: Male     Is Non-Hispanic African American: Yes     Diabetic: No     Tobacco smoker: No     Systolic Blood Pressure: 782 mmHg     Is BP treated: Yes     HDL Cholesterol: 34.1 mg/dL     Total Cholesterol: 178 mg/dL       Relevant Orders   CBC with Differential/Platelet   Lipid panel   Patient Instructions  Health Maintenance After Age 91 After age 24, you are at a higher risk for certain long-term diseases and infections as well as injuries from falls. Falls are a major cause of broken bones and head injuries in people who are older than age 23. Getting regular preventive care can help to keep you healthy and well. Preventive care includes getting regular testing and making lifestyle changes as recommended by your health care provider. Talk with your health care provider  about: Which screenings and tests you should have. A screening is a test that checks for a disease when you have no symptoms. A diet and exercise plan that is right for you. What should I know about screenings and tests to prevent falls? Screening and testing are the best ways to find a health problem early. Early diagnosis and treatment give you the best chance of managing medical conditions that are common after age 52. Certain conditions and lifestyle choices may make you more likely to have a fall. Your health care provider may recommend: Regular vision checks. Poor vision and conditions such as cataracts can make you more likely to have a fall. If you wear glasses, make sure to get your prescription updated if your vision changes. Medicine review. Work with your health care provider to regularly review all of the medicines you are taking, including over-the-counter medicines. Ask your health care provider about any side effects that may make you more likely to have a fall. Tell your health care provider if any medicines that you take make you feel dizzy or sleepy. Strength and balance checks. Your health care provider may recommend certain tests to check your strength and balance while standing, walking, or changing positions. Foot health exam. Foot pain and numbness, as well as not wearing proper footwear, can make you more likely to have a fall. Screenings, including: Osteoporosis screening. Osteoporosis is a condition that causes the bones to get weaker and break more easily. Blood pressure screening. Blood pressure changes and medicines to control blood pressure can make you feel dizzy. Depression screening. You may be  more likely to have a fall if you have a fear of falling, feel depressed, or feel unable to do activities that you used to do. Alcohol use screening. Using too much alcohol can affect your balance and may make you more likely to have a fall. Follow these instructions at  home: Lifestyle Do not drink alcohol if: Your health care provider tells you not to drink. If you drink alcohol: Limit how much you have to: 0-1 drink a day for women. 0-2 drinks a day for men. Know how much alcohol is in your drink. In the [place] one drink equals one 12 oz bottle of beer (355 mL), one 5 oz glass of wine (148 mL), or one 1 oz glass of hard liquor (44 mL). Do not use any products that contain nicotine or tobacco. These products include cigarettes, chewing tobacco, and vaping devices, such as e-cigarettes. If you need help quitting, ask your health care provider. Activity  Follow a regular exercise program to stay fit. This will help you maintain your balance. Ask your health care provider what types of exercise are appropriate for you. If you need a cane or walker, use it as recommended by your health care provider. Wear supportive shoes that have nonskid soles. Safety  Remove any tripping hazards, such as rugs, cords, and clutter. Install safety equipment such as grab bars in bathrooms and safety rails on stairs. Keep rooms and walkways well-lit. General instructions Talk with your health care provider about your risks for falling. Tell your health care provider if: You fall. Be sure to tell your health care provider about all falls, even ones that seem minor. You feel dizzy, tiredness (fatigue), or off-balance. Take over-the-counter and prescription medicines only as told by your health care provider. These include supplements. Eat a healthy diet and maintain a healthy weight. A healthy diet includes low-fat dairy products, low-fat (lean) meats, and fiber from whole grains, beans, and lots of fruits and vegetables. Stay current with your vaccines. Schedule regular health, dental, and eye exams. Summary Having a healthy lifestyle and getting preventive care can help to protect your health and wellness after age 71. Screening and testing are the best way to find a health  problem early and help you avoid having a fall. Early diagnosis and treatment give you the best chance for managing medical conditions that are more common for people who are older than age 7. Falls are a major cause of broken bones and head injuries in people who are older than age 21. Take precautions to prevent a fall at home. Work with your health care provider to learn what changes you can make to improve your health and wellness and to prevent falls. This information is not intended to replace advice given to you by your health care provider. Make sure you discuss any questions you have with your health care provider. Document Revised: 10/16/2020 Document Reviewed: 10/16/2020 Elsevier Patient Education  2022 [place], [place] [facility]

## 2021-07-30 NOTE — Progress Notes (Addendum)
[name] [name], [name] [name] ([id number]) Visit Report for 07/30/2021 Arrival Information Details Patient Name: Date of Service: [name] [name] 07/30/2021 9:45 A M Medical Record Number: [id number] Patient Account Number: [id number] Date of Birth/Sex: Treating RN: 1950/10/17 (71 y.o. [name] [name] Primary Care [name] [name]: [name] Other Clinician: [name] Referring Recia Sons: Treating Garen Woolbright/Extender: [name]: 10 Visit Information History Since Last Visit Added or deleted any medications: No Patient Arrived: Ambulatory Any new allergies or adverse reactions: No Arrival Time: 07:55 Had a fall or experienced change in No Accompanied By: None activities of daily living that may affect Transfer Assistance: None risk of falls: Patient Identification Verified: Yes Hospitalized since last visit: No Secondary Verification Process Completed: Yes Implantable device outside of the clinic excluding No Patient Requires Transmission-Based Precautions: No cellular tissue based products placed in the center Patient Has Alerts: No since last visit: Pain Present Now: No Notes Paper version used prior to treatment start. Electronic Signature(s) Signed: 07/30/2021 11:33:05 AM By: [name] EMT Previous Signature: 07/30/2021 11:19:23 AM Version By: [name] EMT Entered By: [name] on 07/30/2021 11:33:05 -------------------------------------------------------------------------------- Encounter Discharge Information Details Patient Name: Date of Service: [name] [name] 07/30/2021 9:45 A M Medical Record Number: [id number] Patient Account Number: [id number] Date of Birth/Sex: Treating RN: 06/16/1950 (71 y.o. [name] [name] Primary Care Jasman Murri: [name] Other Clinician: [name] Referring Shaquetta Arcos: Treating Corissa Oguinn/Extender: [name]: 10 Encounter Discharge  Information Items Discharge Condition: Stable Ambulatory Status: Ambulatory Discharge Destination: Home Transportation: Private Auto Accompanied By: None Schedule Follow-up Appointment: Yes Clinical Summary of Care: Electronic Signature(s) Signed: 07/30/2021 11:34:43 AM By: [name] EMT Previous Signature: 07/30/2021 11:24:23 AM Version By: [name] EMT Entered By: [name] on 07/30/2021 11:34:43 -------------------------------------------------------------------------------- Patient/Caregiver Education Details Patient Name: Date of Service: [name] [name] 2/20/2023andnbsp9:45 [place] Record Number: [id number] Patient Account Number: [id number] Date of Birth/Gender: Treating RN: Aug 03, 1950 (71 y.o. [name] [name] Primary Care Physician: [name] Other Clinician: [name] Referring Physician: Treating Physician/Extender: [name] in Treatment: 10 Education Assessment Education Provided To: Patient Education Topics Provided Electronic Signature(s) Signed: 07/31/2021 1:41:45 PM By: [name] EMT Entered By: [name] on 07/30/2021 11:34:33 -------------------------------------------------------------------------------- Vitals Details Patient Name: Date of Service: [name] [name] 07/30/2021 9:45 A M Medical Record Number: [id number] Patient Account Number: [id number] Date of Birth/Sex: Treating RN: May 14, 1951 (71 y.o. [name] [name] Primary Care Magaly Pollina: [name] Other Clinician: [name] Referring Rosario Kushner: Treating Neyda Durango/Extender: [name]: 10 Vital Signs Time Taken: 08:14 Temperature (F): 94.6 Height (in): 74 Pulse (bpm): 71 Weight (lbs): 315 Respiratory Rate (breaths/min): 16 Body Mass Index (BMI): 40.4 Blood Pressure (mmHg): 115/70 Reference Range: 80 - 120 mg / dl Notes Paper version used prior to treatment  start. Electronic Signature(s) Signed: 07/30/2021 11:33:16 AM By: [name] EMT Previous Signature: 07/30/2021 11:19:57 AM Version By: [name] EMT Entered By: [name] on 07/30/2021 11:33:15

## 2021-07-30 NOTE — Assessment & Plan Note (Signed)
Diet and nutrition discussed.  Advised to decrease amount of daily carbohydrate intake. 

## 2021-07-30 NOTE — Progress Notes (Signed)
CEM, KOSMAN ([id number]) Visit Report for 07/30/2021 Problem List Details Patient Name: Date of Service: [name] [name] 07/30/2021 9:45 A M Medical Record Number: [id number] Patient Account Number: [id number] Date of Birth/Sex: Treating RN: 28-Aug-1950 (71 y.o. 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 05/15/2021 No Yes C61 Malignant neoplasm of prostate 05/15/2021 No Yes R30.0 Dysuria 05/15/2021 No Yes Z00.00 Encounter for general adult medical examination without abnormal findings 05/15/2021 No Yes Inactive Problems Resolved Problems Electronic Signature(s) Signed: 07/30/2021 11:34:12 AM By: [name] EMT Signed: 07/30/2021 2:02:39 PM By: [name] DO Previous Signature: 07/30/2021 11:23:46 AM Version By: [name] EMT Entered By: [name] on 07/30/2021 11:34:11 -------------------------------------------------------------------------------- SuperBill Details Patient Name: Date of Service: [name] [name] 07/30/2021 Medical Record Number: [id number] Patient Account Number: [id number] Date of Birth/Sex: Treating RN: August 24, 1950 (71 y.o. 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 C61 Malignant neoplasm of prostate R30.0 Dysuria Z00.00 Encounter for general adult medical examination  without abnormal findings Modifier: Quantity: 4 Physician Procedures : CPT4 Code Description Modifier [id number] - WC PHYS HYPERBARIC OXYGEN THERAPY ICD-10 Diagnosis Description 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

## 2021-07-30 NOTE — Assessment & Plan Note (Signed)
Well-controlled hypertension with normal blood pressure readings at home.  Continue Zestoretic 20-12.5 mg daily. Dietary approaches to stop hypertension discussed.

## 2021-07-30 NOTE — Assessment & Plan Note (Signed)
Diet and nutrition discussed.  Continue rosuvastatin 10 mg daily. The 10-year ASCVD risk score (Arnett DK, et al., 2019) is: 20.3%   Values used to calculate the score:     Age: 71 years     Sex: Male     Is Non-Hispanic African American: Yes     Diabetic: No     Tobacco smoker: No     Systolic Blood Pressure: 282 mmHg     Is BP treated: Yes     HDL Cholesterol: 34.1 mg/dL     Total Cholesterol: 178 mg/dL

## 2021-07-30 NOTE — Telephone Encounter (Signed)
°  Chronic Care Management   Follow Up Note   07/30/2021 Name: [name] [name] MRN: [id number] DOB: [birth year]  Referred by: Horald Pollen, MD Reason for referral : Chronic Care Management (CCM RN CM Telephone Outreach, HTN/ HLD Unsuccessful outreach)  An unsuccessful telephone outreach was attempted today. The patient was referred to the case management team for assistance with care management and care coordination.   Follow Up Plan:  A HIPPA compliant phone message was left for the patient providing contact information and requesting a return call Will place request with scheduling care guide to contact patient to re-schedule today's missed CCM RN follow up telephone appointment if I do not hear back from patient by end of day  [name], RN, BSN, [place] [phone]: direct office

## 2021-07-31 ENCOUNTER — Encounter (HOSPITAL_BASED_OUTPATIENT_CLINIC_OR_DEPARTMENT_OTHER): Payer: Medicare Other | Admitting: Internal Medicine

## 2021-07-31 DIAGNOSIS — C61 Malignant neoplasm of prostate: Secondary | ICD-10-CM

## 2021-07-31 DIAGNOSIS — N3040 Irradiation cystitis without hematuria: Secondary | ICD-10-CM | POA: Diagnosis not present

## 2021-07-31 DIAGNOSIS — L598 Other specified disorders of the skin and subcutaneous tissue related to radiation: Secondary | ICD-10-CM | POA: Diagnosis not present

## 2021-07-31 DIAGNOSIS — R300 Dysuria: Secondary | ICD-10-CM | POA: Diagnosis not present

## 2021-07-31 NOTE — Progress Notes (Addendum)
DEEPAK, BLESS ([id number]) Visit Report for 07/31/2021 HBO Details Patient Name: Date of Service: [name] 07/31/2021 7:30 A M Medical Record Number: [id number] Patient Account Number: [id number] Date of Birth/Sex: Treating RN: [birth year] (71 y.o. [name] Primary Care Ferrel Simington: [name] Other Clinician: [name] Referring Kadyn Guild: Treating Zayanna Pundt/Extender: [name] in Treatment: 11 HBO Treatment Course Details Treatment Course Number: 1 Ordering Cylah Fannin: [name] T Treatments Ordered: otal 40 HBO Treatment Start Date: 05/29/2021 HBO Indication: Late Effect of Radiation HBO Treatment Details Treatment Number: 39 Patient Type: Outpatient Chamber Type: Monoplace Chamber Serial #: [id number] Treatment Protocol: 2.0 ATA with 90 minutes oxygen, and no air breaks Treatment Details Compression Rate Down: 1.0 psi / minute De-Compression Rate Up: 1.0 psi / minute Air breaks and breathing Decompress Decompress Compress Tx Pressure Begins Reached periods Begins Ends (leave unused spaces blank) Chamber Pressure (ATA 1 2 ------2 1 ) Clock Time (24 hr) 08:03 08:19 - - - - - - 09:49 10:04 Treatment Length: 121 (minutes) Treatment Segments: 4 Vital Signs Capillary Blood Glucose Reference Range: 80 - 120 mg / dl HBO Diabetic Blood Glucose Intervention Range: <131 mg/dl or >249 mg/dl Time Vitals Blood Respiratory Capillary Blood Glucose Pulse Action Type: Pulse: Temperature: Taken: Pressure: Rate: Glucose (mg/dl): Meter #: Oximetry (%) Taken: Pre 07:56 120/71 71 16 97.8 Post 10:07 115/75 60 16 97.7 Treatment Response Treatment Toleration: Well Treatment Completion Status: Treatment Completed without Adverse Event Treatment Notes Paper version used prior to treatment start. Ezella Kell Notes no concerns with treatment given Physician HBO Attestation: I certify that I supervised this HBO treatment in  accordance with Medicare guidelines. A trained emergency response team is readily available per Yes hospital policies and procedures. Continue HBOT as ordered. Yes Electronic Signature(s) Signed: 07/31/2021 4:44:59 PM By: Linton Ham MD Previous Signature: 07/31/2021 11:42:39 AM Version By: [name] EMT Entered By: Linton Ham on 07/31/2021 15:50:24 -------------------------------------------------------------------------------- HBO Safety Checklist Details Patient Name: Date of Service: [name] 07/31/2021 7:30 A M Medical Record Number: [id number] Patient Account Number: [id number] Date of Birth/Sex: Treating RN: Nov 26, 1950 (71 y.o. [name] Primary Care Blaine Hari: [name] Other Clinician: [name] Referring Zori Benbrook: Treating Hal Norrington/Extender: [name] in Treatment: 11 HBO Safety Checklist Items Safety Checklist Consent Form Signed Patient voided / foley secured and emptied When did you last eato 0600 Last dose of injectable or oral agent NA Ostomy pouch emptied and vented if applicable NA All implantable devices assessed, documented and approved NA Intravenous access site secured and place NA Valuables secured Linens and cotton and cotton/polyester blend (less than 51% polyester) Personal oil-based products / skin lotions / body lotions removed Wigs or hairpieces removed NA Smoking or tobacco materials removed Books / newspapers / magazines / loose paper removed Cologne, aftershave, perfume and deodorant removed Jewelry removed (may wrap wedding band) NA Make-up removed NA Hair care products removed Battery operated devices (external) removed Heating patches and chemical warmers removed Titanium eyewear removed NA Nail polish cured greater than 10 hours Casting material cured greater than 10 hours NA Hearing aids removed NA Loose dentures or partials removed NA Prosthetics have  been removed NA Patient demonstrates correct use of air break device (if applicable) Patient concerns have been addressed Patient grounding bracelet on and cord attached to chamber Specifics for Inpatients (complete in addition to above) Medication sheet sent with patient NA Intravenous medications needed or due during therapy sent with patient NA  Drainage tubes (e.g. nasogastric tube or chest tube secured and vented) NA Endotracheal or Tracheotomy tube secured NA Cuff deflated of air and inflated with saline NA Airway suctioned NA Notes Paper version used prior to treatment start. Electronic Signature(s) Signed: 07/31/2021 11:40:28 AM By: [name] EMT Entered By: [name] on 07/31/2021 11:40:28

## 2021-07-31 NOTE — Progress Notes (Signed)
JAGGAR, BENKO ([id number]) Visit Report for 07/31/2021 Chief Complaint Document Details Patient Name: Date of Service: [name] 07/31/2021 10:45 A M Medical Record Number: [id number] Patient Account Number: [id number] Date of Birth/Sex: Treating RN: Feb 14, 1951 (71 y.o. M) Primary Care Provider: [name] Other Clinician: Referring Provider: Treating Provider/Extender: [name]: 11 Information Obtained from: Patient Chief Complaint Discuss hyperbaric oxygen treatment for radiation cystitis Electronic Signature(s) Signed: 07/31/2021 12:54:15 PM By: [name] DO Entered By: [name] on 07/31/2021 12:32:03 -------------------------------------------------------------------------------- HPI Details Patient Name: Date of Service: [name] 07/31/2021 10:45 A M Medical Record Number: [id number] Patient Account Number: [id number] Date of Birth/Sex: Treating RN: 1950-09-16 (71 y.o. M) Primary Care Provider: [name] Other Clinician: Referring Provider: Treating Provider/Extender: [name]: 11 History of Present Illness HPI Description: Admission 05/15/2021 Mr. Ying Rocks is a 71 year old male with a past medical history of prostate cancer status post radiation who was developed radiation cystitis. He states he has dysuria. He denies gross hematuria. He currently denies signs of infection. Patient's prostate cancer was diagnosed on 02/27/2017 by Dr. Link Snuffer. He had intensitymodulated radiation therapy from 09/09/2019 to 10/18/2019 for a total of 28 treatments. Following radiation thearpy he had a cystoscopy and that showed inflammation and edema at the bladder neck/prostate consistent with radiation cystitis. 05/29/2021; patient presents for HBO treatment today. He has no issues or complaints. He reports continued dysuria. He denies hematuria. He has  taken Ativan today to help with claustrophobia. 1/3; patient presents for HBO treatment today. He has completed 5 treatments. After the first 1-2 treatments he developed ear discomfort and followed up with ENT He reports no issues in the past several HBO treatments. He reports improvement in dysuria and continues to have no hematuria. He takes Ativan right . before treatments to help with claustrophobia. 1/19; patient presents for HBO 30-day evaluation. He has completed 16 treatments. Earlier this week he had ear discomfort due to sinus congestion but this has since cleared. He reports no more issues with ear discomfort. He reports continued improvement in dysuria and continues to have no hematuria. He has no issues or complaints today. 2/21; patient presents for follow-up. Tomorrow he completes his HBO therapy course of 40 treatments. He has no longer had any issues with his ear discomfort for the past 20 treatments. He reports improvement in his dysuria and states that he has minimal pain now. He also reports less urinary frequency. He has no issues or complaints today. Electronic Signature(s) Signed: 07/31/2021 12:54:15 PM By: [name] DO Entered By: [name] on 07/31/2021 12:33:11 -------------------------------------------------------------------------------- Physical Exam Details Patient Name: Date of Service: [name] 07/31/2021 10:45 A M Medical Record Number: [id number] Patient Account Number: [id number] Date of Birth/Sex: Treating RN: 03-17-1951 (71 y.o. M) Primary Care Provider: [name] Other Clinician: Referring Provider: Treating Provider/Extender: [name]: 11 Constitutional respirations regular, non-labored and within target range for patient.[name] Kitchen Respiratory clear to auscultation bilaterally. Cardiovascular regular rate and rhythm with normal S1, S2. Psychiatric pleasant and  cooperative. Electronic Signature(s) Signed: 07/31/2021 12:54:15 PM By: [name] DO Entered By: [name] on 07/31/2021 12:33:47 -------------------------------------------------------------------------------- Physician Orders Details Patient Name: Date of Service: [name] 07/31/2021 10:45 A M Medical Record Number: [id number] Patient Account Number: [id number] Date of Birth/Sex: Treating RN: 1951-02-24 (71 y.o. M) Primary Care Provider: [name] Other Clinician: Referring Provider: Treating  Provider/Extender: [name]: 11 Verbal / Phone Orders: No Diagnosis Coding ICD-10 Coding Code Description N30.40 Irradiation cystitis without hematuria C61 Malignant neoplasm of prostate R30.0 Dysuria Z00.00 Encounter for general adult medical examination without abnormal findings Electronic Signature(s) Signed: 07/31/2021 12:54:15 PM By: [name] DO Entered By: [name] on 07/31/2021 12:34:00 -------------------------------------------------------------------------------- Problem List Details Patient Name: Date of Service: [name] 07/31/2021 10:45 A M Medical Record Number: [id number] Patient Account Number: [id number] Date of Birth/Sex: Treating RN: 06-12-1950 (71 y.o. M) Primary Care Provider: [name] Other Clinician: Referring Provider: Treating Provider/Extender: [name]: 11 Active Problems ICD-10 Encounter Code Description Active Date MDM Diagnosis N30.40 Irradiation cystitis without hematuria 05/15/2021 No Yes C61 Malignant neoplasm of prostate 05/15/2021 No Yes R30.0 Dysuria 05/15/2021 No Yes Z00.00 Encounter for general adult medical examination without abnormal findings 05/15/2021 No Yes Inactive Problems Resolved Problems Electronic Signature(s) Signed: 07/31/2021 12:54:15 PM By: [name] DO Entered By: [name] on 07/31/2021 12:31:49 -------------------------------------------------------------------------------- Progress Note Details Patient Name: Date of Service: [name] 07/31/2021 10:45 A M Medical Record Number: [id number] Patient Account Number: [id number] Date of Birth/Sex: Treating RN: Sep 22, 1950 (71 y.o. M) Primary Care Provider: [name] Other Clinician: Referring Provider: Treating Provider/Extender: [name]: 11 Subjective Chief Complaint Information obtained from Patient Discuss hyperbaric oxygen treatment for radiation cystitis History of Present Illness (HPI) Admission 05/15/2021 Mr. Seena Face is a 71 year old male with a past medical history of prostate cancer status post radiation who was developed radiation cystitis. He states he has dysuria. He denies gross hematuria. He currently denies signs of infection. Patient's prostate cancer was diagnosed on 02/27/2017 by Dr. Link Snuffer. He had intensityoomodulated radiation therapy from 09/09/2019 to 10/18/2019 for a total of 28 treatments. Following radiation thearpy he had a cystoscopy and that showed inflammation and edema at the bladder neck/prostate consistent with radiation cystitis. 05/29/2021; patient presents for HBO treatment today. He has no issues or complaints. He reports continued dysuria. He denies hematuria. He has taken Ativan today to help with claustrophobia. 1/3; patient presents for HBO treatment today. He has completed 5 treatments. After the first 1-2 treatments he developed ear discomfort and followed up with ENT He reports no issues in the past several HBO treatments. He reports improvement in dysuria and continues to have no hematuria. He takes Ativan right . before treatments to help with claustrophobia. 1/19; patient presents for HBO 30-day evaluation. He has completed 16 treatments. Earlier this week he had ear discomfort due to  sinus congestion but this has since cleared. He reports no more issues with ear discomfort. He reports continued improvement in dysuria and continues to have no hematuria. He has no issues or complaints today. 2/21; patient presents for follow-up. Tomorrow he completes his HBO therapy course of 40 treatments. He has no longer had any issues with his ear discomfort for the past 20 treatments. He reports improvement in his dysuria and states that he has minimal pain now. He also reports less urinary frequency. He has no issues or complaints today. Patient History Information obtained from Patient. Family History Cancer - Mother,Father, Diabetes - Mother,Siblings, No family history of Heart Disease, Hereditary Spherocytosis, Hypertension, Kidney Disease, Lung Disease, Seizures, Stroke, Thyroid Problems, Tuberculosis. Social History Never smoker, Marital Status - Married, Alcohol Use - Never, Drug Use - No History, Caffeine Use - Rarely. Medical History Eyes Denies history of Cataracts,  Glaucoma, Optic Neuritis Ear/Nose/Mouth/Throat Denies history of Chronic sinus problems/congestion, Middle ear problems Hematologic/Lymphatic Denies history of Anemia, Hemophilia, Human Immunodeficiency Virus, Lymphedema, Sickle Cell Disease Respiratory Denies history of Aspiration, Asthma, Chronic Obstructive Pulmonary Disease (COPD), Pneumothorax, Sleep Apnea, Tuberculosis Cardiovascular Patient has history of Hypertension Denies history of Angina, Arrhythmia, Congestive Heart Failure, Coronary Artery Disease, Deep Vein Thrombosis, Hypotension, Myocardial Infarction, Peripheral Arterial Disease, Peripheral Venous Disease, Phlebitis, Vasculitis Gastrointestinal Denies history of Cirrhosis , Colitis, Crohnoos, Hepatitis A, Hepatitis B, Hepatitis C Endocrine Denies history of Type I Diabetes, Type II Diabetes Genitourinary Denies history of End Stage Renal Disease Immunological Denies history of Lupus  Erythematosus, Raynaudoos, Scleroderma Integumentary (Skin) Denies history of History of Burn Musculoskeletal Denies history of Gout, Rheumatoid Arthritis, Osteoarthritis, Osteomyelitis Neurologic Denies history of Dementia, Neuropathy, Quadriplegia, Paraplegia, Seizure Disorder Oncologic Patient has history of Received Radiation - 28 treatments 2020 Prostate Ca Denies history of Received Chemotherapy Psychiatric Patient has history of Confinement Anxiety Denies history of Anorexia/bulimia Hospitalization/Surgery History - left ankle fusion 1991. - hernia repair umbilical 15 years ago. Medical A Surgical History Notes nd Cardiovascular hyperlipidemia Gastrointestinal Colon Polyps diverticulosis Genitourinary hematuria radiation cystitis- currently Objective Constitutional respirations regular, non-labored and within target range for patient.[name] Kitchen Respiratory clear to auscultation bilaterally. Cardiovascular regular rate and rhythm with normal S1, S2. Psychiatric pleasant and cooperative. Assessment Active Problems ICD-10 Irradiation cystitis without hematuria Malignant neoplasm of prostate Dysuria Encounter for general adult medical examination without abnormal findings Patient has done well with HBO therapy. Tomorrow completes 40 treatments for irradiation cystitis. He reports improvement in his dysuria and urinary frequency. He is content with his progress. He knows to call with any questions or concerns. He may follow-up as needed for wound care/HBO needs Plan 1. Follow-up as needed Electronic Signature(s) Signed: 07/31/2021 12:54:15 PM By: [name] DO Entered By: [name] on 07/31/2021 12:41:55 -------------------------------------------------------------------------------- HxROS Details Patient Name: Date of Service: [name] 07/31/2021 10:45 A M Medical Record Number: [id number] Patient Account Number: [id number] Date of  Birth/Sex: Treating RN: 1951/05/23 (71 y.o. M) Primary Care Provider: [name] Other Clinician: Referring Provider: Treating Provider/Extender: [name]: 11 Information Obtained From Patient Eyes Medical History: Negative for: Cataracts; Glaucoma; Optic Neuritis Ear/Nose/Mouth/Throat Medical History: Negative for: Chronic sinus problems/congestion; Middle ear problems Hematologic/Lymphatic Medical History: Negative for: Anemia; Hemophilia; Human Immunodeficiency Virus; Lymphedema; Sickle Cell Disease Respiratory Medical History: Negative for: Aspiration; Asthma; Chronic Obstructive Pulmonary Disease (COPD); Pneumothorax; Sleep Apnea; Tuberculosis Cardiovascular Medical History: Positive for: Hypertension Negative for: Angina; Arrhythmia; Congestive Heart Failure; Coronary Artery Disease; Deep Vein Thrombosis; Hypotension; Myocardial Infarction; Peripheral Arterial Disease; Peripheral Venous Disease; Phlebitis; Vasculitis Past Medical History Notes: hyperlipidemia Gastrointestinal Medical History: Negative for: Cirrhosis ; Colitis; Crohns; Hepatitis A; Hepatitis B; Hepatitis C Past Medical History Notes: Colon Polyps diverticulosis Endocrine Medical History: Negative for: Type I Diabetes; Type II Diabetes Genitourinary Medical History: Negative for: End Stage Renal Disease Past Medical History Notes: hematuria radiation cystitis- currently Immunological Medical History: Negative for: Lupus Erythematosus; Raynauds; Scleroderma Integumentary (Skin) Medical History: Negative for: History of Burn Musculoskeletal Medical History: Negative for: Gout; Rheumatoid Arthritis; Osteoarthritis; Osteomyelitis Neurologic Medical History: Negative for: Dementia; Neuropathy; Quadriplegia; Paraplegia; Seizure Disorder Oncologic Medical History: Positive for: Received Radiation - 28 treatments 2020 Prostate Ca Negative for:  Received Chemotherapy Psychiatric Medical History: Positive for: Confinement Anxiety Negative for: Anorexia/bulimia Immunizations Pneumococcal Vaccine: Received Pneumococcal Vaccination: Yes Received Pneumococcal Vaccination On or After 60th Birthday: Yes Implantable Devices No devices  added Hospitalization / Surgery History Type of Hospitalization/Surgery left ankle fusion 1991 hernia repair umbilical 15 years ago Family and Social History Cancer: Yes - Mother,Father; Diabetes: Yes - Mother,Siblings; Heart Disease: No; Hereditary Spherocytosis: No; Hypertension: No; Kidney Disease: No; Lung Disease: No; Seizures: No; Stroke: No; Thyroid Problems: No; Tuberculosis: No; Never smoker; Marital Status - Married; Alcohol Use: Never; Drug Use: No History; Caffeine Use: Rarely; Financial Concerns: No; Food, Clothing or Shelter Needs: No; Support System Lacking: No; Transportation Concerns: No Electronic Signature(s) Signed: 07/31/2021 12:54:15 PM By: [name] DO Entered By: [name] on 07/31/2021 12:33:18 -------------------------------------------------------------------------------- SuperBill Details Patient Name: Date of Service: [name] 07/31/2021 Medical Record Number: [id number] Patient Account Number: [id number] Date of Birth/Sex: Treating RN: 1951-04-16 (71 y.o. M) Primary Care Provider: [name] Other Clinician: Referring Provider: Treating Provider/Extender: [name]: 11 Diagnosis Coding ICD-10 Codes Code Description N30.40 Irradiation cystitis without hematuria C61 Malignant neoplasm of prostate R30.0 Dysuria Z00.00 Encounter for general adult medical examination without abnormal findings Physician Procedures : CPT4 Code Description Modifier [id number] - WC PHYS LEVEL 3 - EST PT ICD-10 Diagnosis Description N30.40 Irradiation cystitis without hematuria C61 Malignant neoplasm of prostate  R30.0 Dysuria Quantity: 1 Electronic Signature(s) Signed: 07/31/2021 12:54:15 PM By: [name] DO Entered By: [name] on 07/31/2021 12:42:41

## 2021-07-31 NOTE — Progress Notes (Addendum)
JOHNELL, BAS ([id number]) Visit Report for 07/31/2021 Clinic Level of Care Assessment Details Patient Name: Date of Service: [name] [name] 07/31/2021 10:45 A M Medical Record Number: [id number] Patient Account Number: [id number] Date of Birth/Sex: Treating RN: [birth year] (71 y.o. [name] Primary Care Dodd Schmid: [name] Other Clinician: Referring Ritamarie Arkin: Treating Shae Augello/Extender: [name]: 11 Clinic Level of Care Assessment Items TOOL 4 Quantity Score X- 1 0 Use when only an EandM is performed on FOLLOW-UP visit ASSESSMENTS - Nursing Assessment / Reassessment X- 1 10 Reassessment of Co-morbidities (includes updates in patient status) X- 1 5 Reassessment of Adherence to Treatment Plan ASSESSMENTS - Wound and Skin A ssessment / Reassessment []  - 0 Simple Wound Assessment / Reassessment - one wound []  - 0 Complex Wound Assessment / Reassessment - multiple wounds []  - 0 Dermatologic / Skin Assessment (not related to wound area) ASSESSMENTS - Focused Assessment []  - 0 Circumferential Edema Measurements - multi extremities []  - 0 Nutritional Assessment / Counseling / Intervention []  - 0 Lower Extremity Assessment (monofilament, tuning fork, pulses) []  - 0 Peripheral Arterial Disease Assessment (using hand held doppler) ASSESSMENTS - Ostomy and/or Continence Assessment and Care []  - 0 Incontinence Assessment and Management []  - 0 Ostomy Care Assessment and Management (repouching, etc.) PROCESS - Coordination of Care X - Simple Patient / Family Education for ongoing care 1 15 []  - 0 Complex (extensive) Patient / Family Education for ongoing care []  - 0 Staff obtains Programmer, systems, Records, T Results / Process Orders est []  - 0 Staff telephones HHA, Nursing Homes / Clarify orders / etc []  - 0 Routine Transfer to another Facility (non-emergent condition) []  - 0 Routine Hospital Admission (non-emergent  condition) []  - 0 New Admissions / Biomedical engineer / Ordering NPWT Apligraf, etc. , []  - 0 Emergency Hospital Admission (emergent condition) X- 1 10 Simple Discharge Coordination []  - 0 Complex (extensive) Discharge Coordination PROCESS - Special Needs []  - 0 Pediatric / Minor Patient Management []  - 0 Isolation Patient Management []  - 0 Hearing / Language / Visual special needs []  - 0 Assessment of Community assistance (transportation, [name]/C planning, etc.) []  - 0 Additional assistance / Altered mentation []  - 0 Support Surface(s) Assessment (bed, cushion, seat, etc.) INTERVENTIONS - Wound Cleansing / Measurement []  - 0 Simple Wound Cleansing - one wound []  - 0 Complex Wound Cleansing - multiple wounds []  - 0 Wound Imaging (photographs - any number of wounds) []  - 0 Wound Tracing (instead of photographs) []  - 0 Simple Wound Measurement - one wound []  - 0 Complex Wound Measurement - multiple wounds INTERVENTIONS - Wound Dressings []  - 0 Small Wound Dressing one or multiple wounds []  - 0 Medium Wound Dressing one or multiple wounds []  - 0 Large Wound Dressing one or multiple wounds []  - 0 Application of Medications - topical []  - 0 Application of Medications - injection INTERVENTIONS - Miscellaneous []  - 0 External ear exam []  - 0 Specimen Collection (cultures, biopsies, blood, body fluids, etc.) []  - 0 Specimen(s) / Culture(s) sent or taken to Lab for analysis []  - 0 Patient Transfer (multiple staff / Civil Service fast streamer / Similar devices) []  - 0 Simple Staple / Suture removal (25 or less) []  - 0 Complex Staple / Suture removal (26 or more) []  - 0 Hypo / Hyperglycemic Management (close monitor of Blood Glucose) []  - 0 Ankle / Brachial Index (ABI) - do not check if billed separately []  - 0  Vital Signs Has the patient been seen at the hospital within the last three years: Yes Total Score: 40 Level Of Care: New/Established - Level 2 Electronic  Signature(s) Signed: 08/06/2021 4:45:57 PM By: Deon Pilling RN, BSN Entered By: Deon Pilling on 08/06/2021 16:14:58 -------------------------------------------------------------------------------- Encounter Discharge Information Details Patient Name: Date of Service: [name] [name] 07/31/2021 10:45 A M Medical Record Number: [id number] Patient Account Number: [id number] Date of Birth/Sex: Treating RN: 21-Jan-1951 (71 y.o. [name] Primary Care Tani Virgo: [name] Other Clinician: Referring Donevin Sainsbury: Treating Scotty Pinder/Extender: [name]: 11 Encounter Discharge Information Items Discharge Condition: Stable Ambulatory Status: Ambulatory Discharge Destination: Home Transportation: Private Auto Accompanied By: self Schedule Follow-up Appointment: No Clinical Summary of Care: Electronic Signature(s) Signed: 08/06/2021 4:45:57 PM By: Deon Pilling RN, BSN Entered By: Deon Pilling on 08/06/2021 16:15:43 -------------------------------------------------------------------------------- Multi Wound Chart Details Patient Name: Date of Service: [name] [name] 07/31/2021 10:45 A M Medical Record Number: [id number] Patient Account Number: [id number] Date of Birth/Sex: Treating RN: 1950-12-05 (71 y.o. M) Primary Care Sarath Privott: [name] Other Clinician: Referring Harnoor Kohles: Treating Brylinn Teaney/Extender: [name]: 11 Wound Assessments Treatment Notes Electronic Signature(s) Signed: 07/31/2021 12:54:15 PM By: [name] DO Entered By: [name] on 07/31/2021 12:31:54 -------------------------------------------------------------------------------- [place] Details Patient Name: Date of Service: [name] [name] 07/31/2021 10:45 A M Medical Record Number: [id number] Patient Account Number: [id number] Date of Birth/Sex: Treating RN: [birth year] (71  y.o. [name] Primary Care Emnet Monk: [name] Other Clinician: Referring Paraskevi Funez: Treating Frady Taddeo/Extender: [name]: 11 Active Inactive Electronic Signature(s) Signed: 08/06/2021 4:45:57 PM By: Deon Pilling RN, BSN Entered By: Deon Pilling on 08/06/2021 16:14:28 -------------------------------------------------------------------------------- Vitals Details Patient Name: Date of Service: [name] [name] 07/31/2021 10:45 A M Medical Record Number: [id number] Patient Account Number: [id number] Date of Birth/Sex: Treating RN: Oct 26, 1950 (71 y.o. [name] Primary Care Finian Helvey: [name] Other Clinician: Referring Margarie Mcguirt: Treating Willman Cuny/Extender: [name]: 11 Vital Signs Time Taken: 11:30 Temperature (F): 97.7 Height (in): 74 Pulse (bpm): 60 Weight (lbs): 315 Respiratory Rate (breaths/min): 20 Body Mass Index (BMI): 40.4 Blood Pressure (mmHg): 115/65 Reference Range: 80 - 120 mg / dl Electronic Signature(s) Signed: 08/06/2021 4:14:05 PM By: Deon Pilling RN, BSN Entered By: Deon Pilling on 08/06/2021 16:14:05

## 2021-07-31 NOTE — Progress Notes (Addendum)
DUKE, WEISENSEL ([id number]) Visit Report for 07/31/2021 Arrival Information Details Patient Name: Date of Service: [name] [name] 07/31/2021 7:30 A M Medical Record Number: [id number] Patient Account Number: [id number] Date of Birth/Sex: Treating RN: 1951-04-08 (71 y.o. [name] [name] Primary Care Nariah Morgano: [name] Other Clinician: [name] Referring Ayomide Zuleta: Treating Fronie Holstein/Extender: [name] in Treatment: 11 Visit Information History Since Last Visit All ordered tests and consults were completed: Yes Patient Arrived: Ambulatory Added or deleted any medications: No Arrival Time: 07:35 Any new allergies or adverse reactions: No Accompanied By: None Had a fall or experienced change in No Transfer Assistance: None activities of daily living that may affect Patient Identification Verified: Yes risk of falls: Secondary Verification Process Completed: Yes Signs or symptoms of abuse/neglect since last visito No Patient Requires Transmission-Based Precautions: No Hospitalized since last visit: No Patient Has Alerts: No Implantable device outside of the clinic excluding No cellular tissue based products placed in the center since last visit: Pain Present Now: No Notes Paper version used prior to treatment start. Electronic Signature(s) Signed: 07/31/2021 11:31:37 AM By: [name] EMT Entered By: [name] on 07/31/2021 11:31:37 -------------------------------------------------------------------------------- Encounter Discharge Information Details Patient Name: Date of Service: [name] [name] 07/31/2021 7:30 A M Medical Record Number: [id number] Patient Account Number: [id number] Date of Birth/Sex: Treating RN: 1951-03-11 (71 y.o. [name] [name] Primary Care Delshon Blanchfield: [name] Other Clinician: [name] Referring Bienvenido Proehl: Treating Justiss Gerbino/Extender: [name] in  Treatment: 11 Encounter Discharge Information Items Discharge Condition: Stable Ambulatory Status: Ambulatory Discharge Destination: Home Transportation: Private Auto Accompanied By: None Schedule Follow-up Appointment: Yes Clinical Summary of Care: Electronic Signature(s) Signed: 07/31/2021 11:45:26 AM By: [name] EMT Entered By: [name] on 07/31/2021 11:45:26 -------------------------------------------------------------------------------- Patient/Caregiver Education Details Patient Name: Date of Service: [name] [name] 2/21/2023andnbsp7:30 [place] Number: [id number] Patient Account Number: [id number] Date of Birth/Gender: Treating RN: Dec 29, 1950 (71 y.o. [name] [name] Primary Care Physician: [name] Other Clinician: [name] Referring Physician: Treating Physician/Extender: [name] in Treatment: 11 Education Assessment Education Provided To: Patient Education Topics Provided Notes Patient to see Dr.Hoffman before he leaves today. Electronic Signature(s) Signed: 07/31/2021 1:41:45 PM By: [name] EMT Entered By: [name] on 07/31/2021 11:45:05 -------------------------------------------------------------------------------- Vitals Details Patient Name: Date of Service: [name] [name] D 07/31/2021 7:30 A M Medical Record Number: [id number] Patient Account Number: [id number] Date of Birth/Sex: Treating RN: 05/07/1951 (71 y.o. [name] [name] Primary Care Eulan Heyward: [name] Other Clinician: [name] Referring Apryl Brymer: Treating Erie Sica/Extender: [name] in Treatment: 11 Vital Signs Time Taken: 07:56 Temperature (F): 97.8 Height (in): 74 Pulse (bpm): 71 Weight (lbs): 315 Respiratory Rate (breaths/min): 16 Body Mass Index (BMI): 40.4 Blood Pressure (mmHg): 120/71 Reference Range: 80 - 120 mg / dl Notes Paper version used  prior to treatment start. Electronic Signature(s) Signed: 07/31/2021 11:39:19 AM By: [name] EMT Entered By: [name] on 07/31/2021 11:39:18

## 2021-07-31 NOTE — Progress Notes (Signed)
[name], [name] ([id number]) Visit Report for 07/31/2021 Problem List Details Patient Name: Date of Service: [name] [name] 07/31/2021 7:30 A M Medical Record Number: [id number] Patient Account Number: [id number] Date of Birth/Sex: Treating RN: November 25, 1950 (71 y.o. 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 05/15/2021 No Yes C61 Malignant neoplasm of prostate 05/15/2021 No Yes R30.0 Dysuria 05/15/2021 No Yes Z00.00 Encounter for general adult medical examination without abnormal findings 05/15/2021 No Yes Inactive Problems Resolved Problems Electronic Signature(s) Signed: 07/31/2021 11:43:57 AM By: [name] EMT Signed: 07/31/2021 4:44:59 PM By: Linton Ham MD Entered By: [name] on 07/31/2021 11:43:57 -------------------------------------------------------------------------------- SuperBill Details Patient Name: Date of Service: [name] [name] 07/31/2021 Medical Record Number: [id number] Patient Account Number: [id number] Date of Birth/Sex: Treating RN: 02-18-1951 (71 y.o. 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 C61 Malignant  neoplasm of prostate R30.0 Dysuria Z00.00 Encounter for general adult medical examination  without abnormal findings Modifier: Quantity: 4 Physician Procedures : CPT4 Code Description Modifier [id number] - WC PHYS HYPERBARIC OXYGEN THERAPY ICD-10 Diagnosis Description 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

## 2021-08-01 ENCOUNTER — Other Ambulatory Visit: Payer: Self-pay

## 2021-08-01 ENCOUNTER — Encounter (HOSPITAL_BASED_OUTPATIENT_CLINIC_OR_DEPARTMENT_OTHER): Payer: Medicare Other | Admitting: Internal Medicine

## 2021-08-01 DIAGNOSIS — L598 Other specified disorders of the skin and subcutaneous tissue related to radiation: Secondary | ICD-10-CM | POA: Diagnosis not present

## 2021-08-01 DIAGNOSIS — R300 Dysuria: Secondary | ICD-10-CM | POA: Diagnosis not present

## 2021-08-01 DIAGNOSIS — N3040 Irradiation cystitis without hematuria: Secondary | ICD-10-CM | POA: Diagnosis not present

## 2021-08-01 NOTE — Progress Notes (Addendum)
[name] [name], [name] [name] ([id number]) Visit Report for 08/01/2021 Arrival Information Details Patient Name: Date of Service: [name] [name] 08/01/2021 7:30 A M Medical Record Number: [id number] Patient Account Number: [id number] Date of Birth/Sex: Treating RN: 1951/04/29 (71 y.o. M) Primary Care Maximos Zayas: [name] Other Clinician: Referring Teresa Nicodemus: Treating Ikran Patman/Extender: Meridee Score in Treatment: 11 Visit Information History Since Last Visit All ordered tests and consults were completed: Yes Patient Arrived: Ambulatory Added or deleted any medications: No Arrival Time: 07:35 Any new allergies or adverse reactions: No Accompanied By: None Had a fall or experienced change in No Transfer Assistance: None activities of daily living that may affect Patient Identification Verified: Yes risk of falls: Secondary Verification Process Completed: Yes Signs or symptoms of abuse/neglect since last visito No Patient Requires Transmission-Based Precautions: No Hospitalized since last visit: No Patient Has Alerts: No Implantable device outside of the clinic excluding No cellular tissue based products placed in the center since last visit: Pain Present Now: No Notes Paper version used prior to treatment start. Electronic Signature(s) Signed: 08/01/2021 11:24:21 AM By: [name] EMT Entered By: [name] on 08/01/2021 11:24:21 -------------------------------------------------------------------------------- Encounter Discharge Information Details Patient Name: Date of Service: [name] [name] 08/01/2021 7:30 A M Medical Record Number: [id number] Patient Account Number: [id number] Date of Birth/Sex: Treating RN: 05-31-1951 (71 y.o. [name] [name] Primary Care [name] [name]: [name] Other Clinician: [name] Referring Chantille Navarrete: Treating Omarrion Carmer/Extender: [name] Weeks in Treatment: 11 Encounter Discharge  Information Items Discharge Condition: Stable Ambulatory Status: Ambulatory Discharge Destination: Home Transportation: Other Accompanied By: None Schedule Follow-up Appointment: No Clinical Summary of Care: Electronic Signature(s) Signed: 08/01/2021 11:35:52 AM By: [name] EMT Entered By: [name] on 08/01/2021 11:35:52 -------------------------------------------------------------------------------- Patient/Caregiver Education Details Patient Name: Date of Service: [name] [name] 2/22/2023andnbsp7:30 [place] Number: [id number] Patient Account Number: [id number] Date of Birth/Gender: Treating RN: 01-25-1951 (71 y.o. [name] [name] Primary Care Physician: [name] Other Clinician: [name] Referring Physician: Treating Physician/Extender: Meridee Score in Treatment: 11 Education Assessment Education Provided To: Patient Education Topics Provided Electronic Signature(s) Signed: 08/01/2021 3:00:26 PM By: [name] EMT Entered By: [name] on 08/01/2021 11:35:33 -------------------------------------------------------------------------------- Vitals Details Patient Name: Date of Service: [name] [name] 08/01/2021 7:30 A M Medical Record Number: [id number] Patient Account Number: [id number] Date of Birth/Sex: Treating RN: 28-Oct-1950 (71 y.o. [name] [name] Primary Care [name] [name]: [name] Other Clinician: [name] Referring [name] [name]: Treating [name] [name]/Extender: [name] Weeks in Treatment: 11 Vital Signs Time Taken: 07:50 Temperature (F): 97.6 Height (in): 74 Pulse (bpm): 78 Weight (lbs): 315 Respiratory Rate (breaths/min): 16 Body Mass Index (BMI): 40.4 Blood Pressure (mmHg): 123/82 Reference Range: 80 - 120 mg / dl Notes Paper version used prior to treatment start. Electronic Signature(s) Signed: 08/01/2021 11:25:49 AM By: [name]  EMT Entered By: [name] on 08/01/2021 11:25:49

## 2021-08-01 NOTE — Progress Notes (Signed)
LA, DIBELLA ([id number]) Visit Report for 08/01/2021 Problem List Details Patient Name: Date of Service: [name] [name] 08/01/2021 7:30 A M Medical Record Number: [id number] Patient Account Number: [id number] Date of Birth/Sex: Treating RN: 20-Jan-1951 (71 y.o. 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 05/15/2021 No Yes C61 Malignant neoplasm of prostate 05/15/2021 No Yes R30.0 Dysuria 05/15/2021 No Yes Z00.00 Encounter for general adult medical examination without abnormal findings 05/15/2021 No Yes Inactive Problems Resolved Problems Electronic Signature(s) Signed: 08/01/2021 11:35:13 AM By: [name] EMT Signed: 08/01/2021 4:57:32 PM By: Worthy Keeler PA-C Entered By: [name] on 08/01/2021 11:35:13 -------------------------------------------------------------------------------- SuperBill Details Patient Name: Date of Service: [name] [name] 08/01/2021 Medical Record Number: [id number] Patient Account Number: [id number] Date of Birth/Sex: Treating RN: Nov 10, 1950 (71 y.o. 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 C61 Malignant  neoplasm of prostate R30.0 Dysuria Z00.00 Encounter for general adult medical examination  without abnormal findings Modifier: Quantity: 4 Physician Procedures : CPT4 Code Description Modifier [id number] 81829 - WC PHYS HYPERBARIC OXYGEN THERAPY ICD-10 Diagnosis Description 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

## 2021-08-01 NOTE — Progress Notes (Addendum)
IZAC, FAULKENBERRY ([id number]) Visit Report for 08/01/2021 HBO Details Patient Name: Date of Service: [name] [name] 08/01/2021 7:30 A M Medical Record Number: [id number] Patient Account Number: [id number] Date of Birth/Sex: Treating RN: Aug 16, 1950 (71 y.o. [name] Primary Care Madalaine Portier: [name] Other Clinician: [name] Referring Queena Monrreal: Treating Lesa Vandall/Extender: [name] Weeks in Treatment: 11 HBO Treatment Course Details Treatment Course Number: 1 Ordering Demontre Padin: [name] T Treatments Ordered: otal 40 HBO Treatment Start 05/29/2021 Date: HBO Indication: Late Effect of Radiation HBO Treatment End 08/01/2021 Date: HBO Discharge Treatment Series Complete; STRN/ORN Protocol Outcome: Goals Achieved HBO Treatment Details Treatment Number: 40 Patient Type: Outpatient Chamber Type: Monoplace Chamber Serial #: [id number] Treatment Protocol: 2.0 ATA with 90 minutes oxygen, and no air breaks Treatment Details Compression Rate Down: 1.0 psi / minute De-Compression Rate Up: 1.0 psi / minute Air breaks and breathing Decompress Decompress Compress Tx Pressure Begins Reached periods Begins Ends (leave unused spaces blank) Chamber Pressure (ATA 1 2 ------2 1 ) Clock Time (24 hr) 07:56 08:12 - - - - - - 09:43 09:58 Treatment Length: 122 (minutes) Treatment Segments: 4 Vital Signs Capillary Blood Glucose Reference Range: 80 - 120 mg / dl HBO Diabetic Blood Glucose Intervention Range: <131 mg/dl or >249 mg/dl Time Vitals Blood Respiratory Capillary Blood Glucose Pulse Action Type: Pulse: Temperature: Taken: Pressure: Rate: Glucose (mg/dl): Meter #: Oximetry (%) Taken: Pre 07:50 123/82 78 16 97.6 Post 10:02 123/81 59 16 97.7 Treatment Response Treatment Toleration: Well Treatment Completion Status: Treatment Completed without Adverse Event Treatment Notes Paper version used prior to treatment start. The patient  did well today. Today was his last treatment. Electronic Signature(s) Signed: 08/01/2021 11:33:54 AM By: [name] EMT Signed: 08/01/2021 4:57:32 PM By: Worthy Keeler PA-C Entered By: [name] on 08/01/2021 11:33:53 -------------------------------------------------------------------------------- HBO Safety Checklist Details Patient Name: Date of Service: [name] [name] 08/01/2021 7:30 A M Medical Record Number: [id number] Patient Account Number: [id number] Date of Birth/Sex: Treating RN: [birth year] (71 y.o. [name] Primary Care Khallid Pasillas: [name] Other Clinician: [name] Referring Sieanna Vanstone: Treating Cayli Escajeda/Extender: [name] Weeks in Treatment: 11 HBO Safety Checklist Items Safety Checklist Consent Form Signed Patient voided / foley secured and emptied When did you last eato 0600 Last dose of injectable or oral agent NA Ostomy pouch emptied and vented if applicable NA All implantable devices assessed, documented and approved NA Intravenous access site secured and place NA Valuables secured Linens and cotton and cotton/polyester blend (less than 51% polyester) Personal oil-based products / skin lotions / body lotions removed Wigs or hairpieces removed NA Smoking or tobacco materials removed Books / newspapers / magazines / loose paper removed Cologne, aftershave, perfume and deodorant removed Jewelry removed (may wrap wedding band) NA Make-up removed NA Hair care products removed NA Battery operated devices (external) removed Heating patches and chemical warmers removed Titanium eyewear removed NA Nail polish cured greater than 10 hours Casting material cured greater than 10 hours NA Hearing aids removed NA Loose dentures or partials removed NA Prosthetics have been removed NA Patient demonstrates correct use of air break device (if applicable) Patient concerns have been addressed Patient  grounding bracelet on and cord attached to chamber Specifics for Inpatients (complete in addition to above) Medication sheet sent with patient NA Intravenous medications needed or due during therapy sent with patient NA Drainage tubes (e.g. nasogastric tube or chest tube secured and vented) NA Endotracheal or Tracheotomy  tube secured NA Cuff deflated of air and inflated with saline NA Airway suctioned NA Notes Paper version used prior to treatment start. Electronic Signature(s) Signed: 08/01/2021 11:27:12 AM By: [name] EMT Entered By: [name] on 08/01/2021 11:27:11

## 2021-08-06 DIAGNOSIS — M170 Bilateral primary osteoarthritis of knee: Secondary | ICD-10-CM | POA: Diagnosis not present

## 2021-08-08 ENCOUNTER — Other Ambulatory Visit: Payer: Self-pay | Admitting: Emergency Medicine

## 2021-08-08 DIAGNOSIS — E785 Hyperlipidemia, unspecified: Secondary | ICD-10-CM

## 2021-08-13 DIAGNOSIS — M170 Bilateral primary osteoarthritis of knee: Secondary | ICD-10-CM | POA: Diagnosis not present

## 2021-08-13 NOTE — Telephone Encounter (Signed)
RS 08/20/21 ? ?[name] [name]  ?Care Guide, Embedded Care Coordination ?[place]  [facility]  ?Direct Dial: [phone] ? ?

## 2021-08-16 DIAGNOSIS — N3040 Irradiation cystitis without hematuria: Secondary | ICD-10-CM | POA: Diagnosis not present

## 2021-08-20 ENCOUNTER — Ambulatory Visit (INDEPENDENT_AMBULATORY_CARE_PROVIDER_SITE_OTHER): Payer: Medicare Other | Admitting: *Deleted

## 2021-08-20 DIAGNOSIS — E785 Hyperlipidemia, unspecified: Secondary | ICD-10-CM

## 2021-08-20 DIAGNOSIS — M170 Bilateral primary osteoarthritis of knee: Secondary | ICD-10-CM | POA: Diagnosis not present

## 2021-08-20 DIAGNOSIS — I10 Essential (primary) hypertension: Secondary | ICD-10-CM

## 2021-08-20 NOTE — Patient Instructions (Signed)
Visit Information  [place], thank you for taking time to talk with me today. Please don't hesitate to contact me if I can be of assistance to you before our next scheduled telephone appointment.  Below are the goals we discussed today:  Patient Self-Care Activities: Patient [name] [name] will:  Continue to periodically check your blood pressures at home Write your blood pressure results down on paper, in a log or diary so you don't forget what they are Continue to take your medications as they are prescribed Continue to follow a heart healthy, low cholesterol, low salt diet Stay as active as you are able Keep up the great work preventing falls  Our next scheduled telephone follow up visit/ appointment is scheduled on:   Wednesday, February 13, 2022 at 9:00 am- This is a PHONE CALL appointment  If you need to cancel or re-schedule our visit, please call [phone] and our care guide team will be happy to assist you.   I look forward to hearing about your progress.   [name], RN, BSN, [place] [phone]: direct office  If you are experiencing a Mental Health or [place] or need someone to talk to, please  call the Suicide and Crisis Lifeline: 988 call the [place] National Suicide Prevention Lifeline: 303-857-1233 or TTY: 480-693-8000 TTY 4094700539) to talk to a trained counselor call 1-800-273-TALK (toll free, 24 hour hotline) go to [facility] [address] [place] [phone]) call 911   Patient verbalizes understanding of instructions and care plan provided today and agrees to view in [place]. Active MyChart status confirmed with patient  Heart-Healthy Eating Plan Many factors influence your heart (coronary) health, including eating and exercise habits. Coronary risk increases with abnormal blood fat (lipid) levels. Heart-healthy meal planning includes  limiting unhealthy fats, increasing healthy fats, and making other diet and lifestyle changes. What is my plan? Your health care provider may recommend that you: Limit your fat intake to _________% or less of your total calories each day. Limit your saturated fat intake to _________% or less of your total calories each day. Limit the amount of cholesterol in your diet to less than _________ mg per day. What are tips for following this plan? Cooking Cook foods using methods other than frying. Baking, boiling, grilling, and broiling are all good options. Other ways to reduce fat include: Removing the skin from poultry. Removing all visible fats from meats. Steaming vegetables in water or broth. Meal planning  At meals, imagine dividing your plate into fourths: Fill one-half of your plate with vegetables and green salads. Fill one-fourth of your plate with whole grains. Fill one-fourth of your plate with lean protein foods. Eat 4-5 servings of vegetables per day. One serving equals 1 cup raw or cooked vegetable, or 2 cups raw leafy greens. Eat 4-5 servings of fruit per day. One serving equals 1 medium whole fruit,  cup dried fruit,  cup fresh, frozen, or canned fruit, or  cup 100% fruit juice. Eat more foods that contain soluble fiber. Examples include apples, broccoli, carrots, beans, peas, and barley. Aim to get 25-30 g of fiber per day. Increase your consumption of legumes, nuts, and seeds to 4-5 servings per week. One serving of dried beans or legumes equals  cup cooked, 1 serving of nuts is  cup, and 1 serving of seeds equals 1 tablespoon. Fats Choose healthy fats more often. Choose monounsaturated and polyunsaturated fats, such  as olive and canola oils, flaxseeds, walnuts, almonds, and seeds. Eat more omega-3 fats. Choose salmon, mackerel, sardines, tuna, flaxseed oil, and ground flaxseeds. Aim to eat fish at least 2 times each week. Check food labels carefully to identify foods with  trans fats or high amounts of saturated fat. Limit saturated fats. These are found in animal products, such as meats, butter, and cream. Plant sources of saturated fats include palm oil, palm kernel oil, and coconut oil. Avoid foods with partially hydrogenated oils in them. These contain trans fats. Examples are stick margarine, some tub margarines, cookies, crackers, and other baked goods. Avoid fried foods. General information Eat more home-cooked food and less restaurant, buffet, and fast food. Limit or avoid alcohol. Limit foods that are high in starch and sugar. Lose weight if you are overweight. Losing just 5-10% of your body weight can help your overall health and prevent diseases such as diabetes and heart disease. Monitor your salt (sodium) intake, especially if you have high blood pressure. Talk with your health care provider about your sodium intake. Try to incorporate more vegetarian meals weekly. What foods can I eat? Fruits All fresh, canned (in natural juice), or frozen fruits. Vegetables Fresh or frozen vegetables (raw, steamed, roasted, or grilled). Green salads. Grains Most grains. Choose whole wheat and whole grains most of the time. Rice and pasta, including brown rice and pastas made with whole wheat. Meats and other proteins Lean, well-trimmed beef, veal, pork, and lamb. Chicken and [place] without skin. All fish and shellfish. Wild duck, rabbit, pheasant, and venison. Egg whites or low-cholesterol egg substitutes. Dried beans, peas, lentils, and tofu. Seeds and most nuts. Dairy Low-fat or nonfat cheeses, including ricotta and mozzarella. Skim or 1% milk (liquid, powdered, or evaporated). Buttermilk made with low-fat milk. Nonfat or low-fat yogurt. Fats and oils Non-hydrogenated (trans-free) margarines. Vegetable oils, including soybean, sesame, sunflower, olive, peanut, safflower, corn, canola, and cottonseed. Salad dressings or mayonnaise made with a vegetable  oil. Beverages Water (mineral or sparkling). Coffee and tea. Diet carbonated beverages. Sweets and desserts Sherbet, gelatin, and fruit ice. Small amounts of dark chocolate. Limit all sweets and desserts. Seasonings and condiments All seasonings and condiments. The items listed above may not be a complete list of foods and beverages you can eat. Contact a dietitian for more options. What foods are not recommended? Fruits Canned fruit in heavy syrup. Fruit in cream or butter sauce. Fried fruit. Limit coconut. Vegetables Vegetables cooked in cheese, cream, or butter sauce. Fried vegetables. Grains Breads made with saturated or trans fats, oils, or whole milk. Croissants. Sweet rolls. Donuts. High-fat crackers, such as cheese crackers. Meats and other proteins Fatty meats, such as hot dogs, ribs, sausage, bacon, rib-eye roast or steak. High-fat deli meats, such as salami and bologna. Caviar. Domestic duck and goose. Organ meats, such as liver. Dairy Cream, sour cream, cream cheese, and creamed cottage cheese. Whole-milk cheeses. Whole or 2% milk (liquid, evaporated, or condensed). Whole buttermilk. Cream sauce or high-fat cheese sauce. Whole-milk yogurt. Fats and oils Meat fat, or shortening. Cocoa butter, hydrogenated oils, palm oil, coconut oil, palm kernel oil. Solid fats and shortenings, including bacon fat, salt pork, lard, and butter. Nondairy cream substitutes. Salad dressings with cheese or sour cream. Beverages Regular sodas and any drinks with added sugar. Sweets and desserts Frosting. Pudding. Cookies. Cakes. Pies. Milk chocolate or white chocolate. Buttered syrups. Full-fat ice cream or ice cream drinks. The items listed above may not be a complete list of foods  and beverages to avoid. Contact a dietitian for more information. Summary Heart-healthy meal planning includes limiting unhealthy fats, increasing healthy fats, and making other diet and lifestyle changes. Lose weight if  you are overweight. Losing just 5-10% of your body weight can help your overall health and prevent diseases such as diabetes and heart disease. Focus on eating a balance of foods, including fruits and vegetables, low-fat or nonfat dairy, lean protein, nuts and legumes, whole grains, and heart-healthy oils and fats. This information is not intended to replace advice given to you by your health care provider. Make sure you discuss any questions you have with your health care provider. Document Revised: 10/05/2020 Document Reviewed: 10/05/2020 Elsevier Patient Education  2022 [employer].

## 2021-08-20 NOTE — Chronic Care Management (AMB) (Cosign Needed)
Chronic Care Management   CCM RN Visit Note  08/20/2021 Name: [name] [name] MRN: [id number] DOB: 03-02-1951  Subjective: [name] [name] is a 71 y.o. year old male who is a primary care patient of Sagardia, Ines Bloomer, MD. The care management team was consulted for assistance with disease management and care coordination needs.    Engaged with patient by telephone for follow up visit in response to provider referral for case management and/or care coordination services.   Consent to Services:  The patient was given information about Chronic Care Management services, agreed to services, and gave verbal consent prior to initiation of services.  Please see initial visit note for detailed documentation.  Patient agreed to services and verbal consent obtained.   Assessment: Review of patient past medical history, allergies, medications, health status, including review of consultants reports, laboratory and other test data, was performed as part of comprehensive evaluation and provision of chronic care management services.   SDOH (Social Determinants of Health) assessments and interventions performed:  SDOH Interventions    Flowsheet Row Most Recent Value  SDOH Interventions   Food Insecurity Interventions Intervention Not Indicated  Housing Interventions Intervention Not Indicated  Transportation Interventions Intervention Not Indicated  [Patient continues to drive self]      CCM Care Plan  Allergies  Allergen Reactions   Aspirin     REACTION: upsets stomach   Statins     REACTION: UPSETS STOMACH   Gadolinium Derivatives Nausea And Vomiting    Pt was given 20 ml multihance and became nauseated and vomited several times.    Outpatient Encounter Medications as of 08/20/2021  Medication Sig Note   ALPRAZolam (XANAX) 0.5 MG tablet Take 1 tablet (0.5 mg total) by mouth daily as needed for anxiety.    cephALEXin (KEFLEX) 250 MG capsule TAKE 1 CAPSULE BY MOUTH EVERYDAY AT BEDTIME     ELDERBERRY PO Take 50 tablets by mouth.    lisinopril-hydrochlorothiazide (ZESTORETIC) 20-12.5 MG tablet Take 1 tablet by mouth daily. 12/07/2020: 12/07/20: Patient reports he has not yet heard from pharmacy from 11/16/20 PCP dosage increase: reports currently still taking 10-12.5 mg QD: care coordination outreach placed to pharmacy- see narrative in care plan for details   LORazepam (ATIVAN) 1 MG tablet Take 1 mg by mouth daily.    multivitamin (ONE-A-DAY MEN'S) TABS tablet Take 1 tablet by mouth daily.    oxybutynin (DITROPAN) 5 MG tablet Take 5 mg by mouth at bedtime.    rosuvastatin (CRESTOR) 10 MG tablet TAKE 1 TABLET BY MOUTH EVERY DAY    No facility-administered encounter medications on file as of 08/20/2021.   Patient Active Problem List   Diagnosis Date Noted   Body mass index (BMI) of 38.0-38.9 in adult 01/10/2020   Diverticulosis 01/10/2020   Prediabetes 01/10/2020   Dyslipidemia 01/10/2020   Malignant neoplasm of prostate ([place]) 06/08/2019   Prostate enlargement 08/29/2017   Gout 10/25/2014   Essential hypertension    Benign prostatic hyperplasia    Kidney stones    Prostate nodule    Conditions to be addressed/monitored:  HTN and HLD  Care Plan : Hypertension (Adult)  Updates made by Knox Royalty, RN since 08/20/2021 12:00 AM     Problem: Hypertension (Hypertension)   Priority: Medium     Long-Range Goal: Hypertension Monitored   Start Date: 12/07/2020  Expected End Date: 12/07/2021  Recent Progress: On track  Priority: Medium  Note:   Case Manager Clinical Goal(s):  12/07/20: Over the next 12 months  patient will demonstrate improved adherence to prescribed treatment plan for hypertension as evidenced by patient reporting during CCM RN CM outreach of: Taking all medications as prescribed Monitoring and recording blood pressure twice a week Improved adherence to low sodium/DASH diet Increased activity Interventions:  Collaboration with Horald Pollen, MD  regarding development and update of comprehensive plan of care as evidenced by provider attestation and co-signature Inter-disciplinary care team collaboration (see longitudinal plan of care) Evaluation of current treatment plan related to hypertension self management and patient's adherence to plan as established by provider; chart reviewed including relevant office notes, upcoming scheduled appointments, and lab results 08/20/21- Goal completed due to duplication of goals, progression of care plan with conversion to new format; goals re-established and extended     Care Plan : [place] of Care  Updates made by Knox Royalty, RN since 08/20/2021 12:00 AM     Problem: Chronic Disease Management Needs   Priority: High     Long-Range Goal: Ongoing adherence to established plan of care for long term chronic disease management   Start Date: 12/07/2020  Expected End Date: 12/07/2021  Priority: High  Note:   Current Barriers:  Chronic Disease Management support and education needs related to HTN and HLD  RNCM Clinical Goal(s):  Patient will demonstrate ongoing health management independence as evidenced by ongoing adherence to established plan of care for HTN/ HLD        through collaboration with RN Care manager, provider, and care team.   Interventions: 1:1 collaboration with primary care provider regarding development and update of comprehensive plan of care as evidenced by provider attestation and co-signature Inter-disciplinary care team collaboration (see longitudinal plan of care) Evaluation of current treatment plan related to  self management and patient's adherence to plan as established by provider 08/20/21: SDOH updated: no concerns or unmet needs identified Depression screening updated: no concerns identified Pain assessment updated: patient denies acute/ chronic pain: reports getting knee injections, "which helps" Falls assessment updated: denies new/ recent falls x  12 months; does not use assistive devices; positive reinforcement provided with encouragement to continue efforts, previously provided education around fall risks/ prevention reinforced Confirmed no recent medication changes/ concerns/ issues/ problems- he continues to self manage and endorses adherence to all prescribed medications Reviewed recent provider PCP appointment 07/30/21- he reports good understanding of post-office visit instructions and denies questions Encouraged patient to remember to schedule next PCP office visit, recommended in late August  Hypertension/ HLD: (Status: 08/20/21: Goal on Track (progressing): YES.) Long Term Goal  Last practice recorded BP readings:  BP Readings from Last 3 Encounters:  07/30/21 130/70  01/23/21 132/70  01/09/21 (!) 142/81  Most recent eGFR/CrCl: No results found for: EGFR  No components found for: CRCL  Evaluation of current treatment plan related to hypertension self management and patient's adherence to plan as established by provider;   Reviewed prescribed diet heart healthy, low cholesterol, low salt Counseled on the importance of exercise goals with target of 150 minutes per week Discussed plans with patient for ongoing care management follow up and provided patient with direct contact information for care management team; Discussed complications of poorly controlled blood pressure such as heart disease, stroke, circulatory complications, vision complications, kidney impairment, sexual dysfunction;  Briefly reviewed recent lab results with patient Confirmed patient continues trying to remain active around knee injection interventions: he reports hoping to resume walking ona regular basis with coming of nicer weather- this was encouraged Confirmed patient  continues taking prescribed medications for HTN/ HLD  Patient Goals/Self-Care Activities: As evidenced by review of EHR, collaboration with care team, and patient reporting during CCM RN CM  outreach,  Patient [place] will:  Continue to periodically check your blood pressures at home Write your blood pressure results down on paper, in a log or diary so you don't forget what they are Continue to take your medications as they are prescribed Continue to follow a heart healthy, low cholesterol, low salt diet Stay as active as you are able Keep up the great work preventing falls      Plan: Telephone follow up appointment with care management team member scheduled for:  Wednesday, February 13, 2022 at 9:00 am The patient has been provided with contact information for the care management team and has been advised to call with any health related questions or concerns  [name], RN, BSN, [place] [phone]: direct office

## 2021-08-23 DIAGNOSIS — Z23 Encounter for immunization: Secondary | ICD-10-CM | POA: Diagnosis not present

## 2021-09-07 DIAGNOSIS — I10 Essential (primary) hypertension: Secondary | ICD-10-CM

## 2021-09-07 DIAGNOSIS — E785 Hyperlipidemia, unspecified: Secondary | ICD-10-CM

## 2021-11-20 DIAGNOSIS — M1711 Unilateral primary osteoarthritis, right knee: Secondary | ICD-10-CM | POA: Diagnosis not present

## 2021-11-25 ENCOUNTER — Other Ambulatory Visit: Payer: Self-pay | Admitting: Emergency Medicine

## 2021-11-25 DIAGNOSIS — I10 Essential (primary) hypertension: Secondary | ICD-10-CM

## 2021-12-04 ENCOUNTER — Other Ambulatory Visit: Payer: Self-pay | Admitting: Emergency Medicine

## 2021-12-04 DIAGNOSIS — F418 Other specified anxiety disorders: Secondary | ICD-10-CM

## 2021-12-04 NOTE — Telephone Encounter (Signed)
Patient is requesting a refill of the following medications: Requested Prescriptions   Pending Prescriptions Disp Refills   ALPRAZolam (XANAX) 0.5 MG tablet [Pharmacy Med Name: ALPRAZOLAM 0.5 MG TABLET] 20 tablet     Sig: TAKE 1 TABLET BY MOUTH EVERY DAY AS NEEDED FOR ANXIETY    Date of patient request: 12/04/2021 Last office visit: 028/20/2023 Date of last refill: 12/31/2020 Last refill amount: 20 tabs  Follow up time period per chart: no f/u appt on file

## 2021-12-21 DIAGNOSIS — R310 Gross hematuria: Secondary | ICD-10-CM | POA: Diagnosis not present

## 2021-12-31 DIAGNOSIS — R310 Gross hematuria: Secondary | ICD-10-CM | POA: Diagnosis not present

## 2021-12-31 DIAGNOSIS — R338 Other retention of urine: Secondary | ICD-10-CM | POA: Diagnosis not present

## 2022-01-02 ENCOUNTER — Other Ambulatory Visit: Payer: Self-pay | Admitting: Ophthalmology

## 2022-01-02 DIAGNOSIS — R310 Gross hematuria: Secondary | ICD-10-CM | POA: Diagnosis not present

## 2022-01-02 DIAGNOSIS — N3041 Irradiation cystitis with hematuria: Secondary | ICD-10-CM | POA: Diagnosis not present

## 2022-01-02 DIAGNOSIS — N413 Prostatocystitis: Secondary | ICD-10-CM | POA: Diagnosis not present

## 2022-01-02 DIAGNOSIS — N3011 Interstitial cystitis (chronic) with hematuria: Secondary | ICD-10-CM | POA: Diagnosis not present

## 2022-01-16 ENCOUNTER — Ambulatory Visit: Payer: Medicare Other | Admitting: *Deleted

## 2022-01-16 DIAGNOSIS — I10 Essential (primary) hypertension: Secondary | ICD-10-CM

## 2022-01-16 DIAGNOSIS — E785 Hyperlipidemia, unspecified: Secondary | ICD-10-CM

## 2022-01-16 NOTE — Chronic Care Management (AMB) (Signed)
Care Management    RN Visit Note  01/16/2022 Name: [name] [name] MRN: [id number] DOB: 1950/07/27  Subjective: [name] [name] is a 71 y.o. year old male who is a primary care patient of [name], [name] Bloomer, MD. The care management team was consulted for assistance with disease management and care coordination needs.    Engaged with patient by telephone for follow up visit/ RN CM case closure in response to provider referral for case management and/or care coordination services.   Consent to Services:   [name] [name] was given information about Care Management services 11/30/20 including:  Care Management services includes personalized support from designated clinical staff supervised by his physician, including individualized plan of care and coordination with other care providers 24/7 contact phone numbers for assistance for urgent and routine care needs. The patient may stop case management services at any time by phone call to the office staff.  Patient agreed to services and consent obtained.   Assessment: Review of patient past medical history, allergies, medications, health status, including review of consultants reports, laboratory and other test data, was performed as part of comprehensive evaluation and provision of chronic care management services.   SDOH (Social Determinants of Health) assessments and interventions performed:  SDOH Interventions    Flowsheet Row Most Recent Value  SDOH Interventions   Food Insecurity Interventions Intervention Not Indicated  Transportation Interventions Intervention Not Indicated  [continues driving self]     Care Plan  Allergies  Allergen Reactions   Aspirin     REACTION: upsets stomach   Statins     REACTION: UPSETS STOMACH   Gadolinium Derivatives Nausea And Vomiting    Pt was given 20 ml multihance and became nauseated and vomited several times.    Outpatient Encounter Medications as of 01/16/2022  Medication Sig   ALPRAZolam  (XANAX) 0.5 MG tablet TAKE 1 TABLET BY MOUTH EVERY DAY AS NEEDED FOR ANXIETY   cephALEXin (KEFLEX) 250 MG capsule TAKE 1 CAPSULE BY MOUTH EVERYDAY AT BEDTIME   ELDERBERRY PO Take 50 tablets by mouth.   lisinopril-hydrochlorothiazide (ZESTORETIC) 20-12.5 MG tablet TAKE 1 TABLET BY MOUTH EVERY DAY   LORazepam (ATIVAN) 1 MG tablet Take 1 mg by mouth daily.   multivitamin (ONE-A-DAY MEN'S) TABS tablet Take 1 tablet by mouth daily.   oxybutynin (DITROPAN) 5 MG tablet Take 5 mg by mouth at bedtime.   rosuvastatin (CRESTOR) 10 MG tablet TAKE 1 TABLET BY MOUTH EVERY DAY   No facility-administered encounter medications on file as of 01/16/2022.   Patient Active Problem List   Diagnosis Date Noted   Body mass index (BMI) of 38.0-38.9 in adult 01/10/2020   Diverticulosis 01/10/2020   Prediabetes 01/10/2020   Dyslipidemia 01/10/2020   Malignant neoplasm of prostate ([place]) 06/08/2019   Prostate enlargement 08/29/2017   Gout 10/25/2014   Essential hypertension    Benign prostatic hyperplasia    Kidney stones    Prostate nodule    Conditions to be addressed/monitored: HTN and HLD  Care Plan : RN Care Manager Plan of Care  Updates made by Knox Royalty, RN since 01/16/2022 12:00 AM     Problem: Chronic Disease Management Needs   Priority: High     Long-Range Goal: Ongoing adherence to established plan of care for long term chronic disease management   Start Date: 12/07/2020  Expected End Date: 12/07/2021  Priority: High  Note:   Current Barriers:  Chronic Disease Management support and education needs related to HTN and HLD  RNCM Clinical Goal(s):  Patient will demonstrate ongoing health management independence as evidenced by ongoing adherence to established plan of care for HTN/ HLD        through collaboration with RN Care manager, provider, and care team.   Interventions: 1:1 collaboration with primary care provider regarding development and update of comprehensive plan of care as  evidenced by provider attestation and co-signature Inter-disciplinary care team collaboration (see longitudinal plan of care) Evaluation of current treatment plan related to  self management and patient's adherence to plan as established by provider SDOH updated: no concerns or unmet needs identified Pain assessment updated: patient denies acute/ chronic pain today: reports continues getting knee injections, as needed, he again states "helps" intermittent knee pain Falls assessment updated: denies new/ recent falls x 12 months; does not regularly use assistive devices, but has cane and uses as needed; positive reinforcement provided with encouragement to continue efforts, previously provided education around fall risks/ prevention reinforced Confirmed no recent medication changes/ concerns/ issues/ problems- he continues to self manage and endorses adherence to all prescribed medications Discussed recent "prostate surgery" "a couple of weeks ago;" states he had outpatient surgery and tolerated well; denies post-surgery complications or problems Again encouraged patient to schedule next PCP office visit, recommended  next appointment in late August- he is appreciative of reminder and states he will schedule; he has no current appointments scheduled Confirmed patient obtained COVID vaccine # 4 in March 2023 Discussed plans with patient for ongoing care management follow up- patient denies current care coordination/ care management needs and is agreeable to clinic RN CM case closure today; verbalizes understanding to contact PCP or other care providers for any needs that arise in the future, and confirms he has contact information for all care providers     Hypertension/ HLD: (Status: 01/16/22: Goal Met.) Long Term Goal  Last practice recorded BP readings:  BP Readings from Last 3 Encounters:  07/30/21 130/70  01/23/21 132/70  01/09/21 (!) 142/81  Most recent eGFR/CrCl: No results found for: EGFR  No  components found for: CRCL  Evaluation of current treatment plan related to hypertension self management and patient's adherence to plan as established by provider;   Reviewed prescribed diet heart healthy, low cholesterol, low salt Counseled on the importance of exercise goals with target of 150 minutes per week Discussed plans with patient for ongoing care management follow up and provided patient with direct contact information for care management team; Discussed complications of poorly controlled blood pressure such as heart disease, stroke, circulatory complications, vision complications, kidney impairment, sexual dysfunction;  Confirmed patient continues trying to remain active around knee injection interventions/ intermittent knee pain: he reports he has started walking more than he was at our last outreach in March; reinforced previously provided education around benefit of activity for overall health/ blood pressure/ cardiovascular health Confirmed patient continues adherent to prescribed diet: he reports he has lost "about 15 pounds" since our last outreach; states he eliminated sugar from his diet and has "just been eating healthier" which he attributes to recent weight loss- positive reinforcement provided with encouragement to continue efforts Confirmed patient continues taking prescribed medications for HTN/ HLD     Plan:  No further follow up required: patient denies current care coordination/ care management needs and is agreeable to RN CM case closure today; case closure accordingly     [name], RN, BSN, [place] [phone]: direct office

## 2022-01-21 DIAGNOSIS — R310 Gross hematuria: Secondary | ICD-10-CM | POA: Diagnosis not present

## 2022-02-04 ENCOUNTER — Telehealth: Payer: Self-pay | Admitting: Emergency Medicine

## 2022-02-04 DIAGNOSIS — M1711 Unilateral primary osteoarthritis, right knee: Secondary | ICD-10-CM | POA: Diagnosis not present

## 2022-02-04 NOTE — Telephone Encounter (Signed)
Surgical clearance from [facility] is in Dr. Barry Brunner box up front.

## 2022-02-04 NOTE — Telephone Encounter (Signed)
Surgical clearance in the provider box. Awaiting provider signature. Will fax back once completed

## 2022-02-05 NOTE — Telephone Encounter (Signed)
Called patient to inform him that an appt is needed in order to complete this surgical clearance form. Patient is coming in to see the provider tomorrow

## 2022-02-06 ENCOUNTER — Ambulatory Visit (INDEPENDENT_AMBULATORY_CARE_PROVIDER_SITE_OTHER): Payer: Medicare Other | Admitting: Emergency Medicine

## 2022-02-06 ENCOUNTER — Encounter: Payer: Self-pay | Admitting: Emergency Medicine

## 2022-02-06 VITALS — BP 138/74 | HR 67 | Temp 98.4°F | Ht 74.0 in | Wt 301.0 lb

## 2022-02-06 DIAGNOSIS — E785 Hyperlipidemia, unspecified: Secondary | ICD-10-CM

## 2022-02-06 DIAGNOSIS — I10 Essential (primary) hypertension: Secondary | ICD-10-CM

## 2022-02-06 DIAGNOSIS — Z01818 Encounter for other preprocedural examination: Secondary | ICD-10-CM

## 2022-02-06 NOTE — Progress Notes (Signed)
Subjective:    [name] [name] is a 71 y.o. male who presents to the office today for a preoperative consultation at the request of surgeon orthopedist who plans on performing right knee replacement on September  2023 . This consultation is requested for medical/cardiac clearance.  Planned anesthesia: general. The patient has the following known anesthesia issues:  None . Patients bleeding risk: no recent abnormal bleeding. Patient does not have objections to receiving blood products if needed.  The following portions of the patient's history were reviewed and updated as appropriate: allergies, current medications, past family history, past medical history, past social history, past surgical history, and problem list.  Review of Systems Pertinent items noted in HPI and remainder of comprehensive ROS otherwise negative.    Objective:    BP 138/74   Pulse 67   Temp 98.4 F (36.9 C) (Oral)   Ht '6\' 2"'$  (1.88 m)   Wt (!) 301 lb (136.5 kg)   SpO2 97%   BMI 38.65 kg/m   General Appearance:    Alert, cooperative, no distress, appears stated age  Head:    Normocephalic, without obvious abnormality, atraumatic  Eyes:    PERRL, conjunctiva/corneas clear, EOM's intact      Ears:    Normal TM's and external ear canals, both ears  Nose:   Nares normal, septum midline, mucosa normal, no drainage    or sinus tenderness  Throat:   Lips, mucosa, and tongue normal; teeth and gums normal  Neck:   Supple, symmetrical, trachea midline, no adenopathy;       thyroid:  No enlargement/tenderness/nodules; no carotid   bruit or JVD  Back:     Symmetric, no curvature, ROM normal, no CVA tenderness  Lungs:     Clear to auscultation bilaterally, respirations unlabored  Chest wall:    No tenderness or deformity  Heart:    Regular rate and rhythm, S1 and S2 normal, no murmur, rub   or gallop  Abdomen:     Soft, non-tender, bowel sounds active all four quadrants,    no masses, no organomegaly        Extremities:    Extremities normal, atraumatic, no cyanosis or edema  Pulses:   2+ and symmetric all extremities  Skin:   Skin color, texture, turgor normal, no rashes or lesions  Lymph nodes:   Cervical, supraclavicular, and axillary nodes normal  Neurologic:   CNII-XII intact. Normal strength, sensation and reflexes      throughout    Predictors of intubation difficulty:  Morbid obesity? no  Anatomically abnormal facies? no  Prominent incisors? no  Receding mandible? no  Short, thick neck? no  Neck range of motion: normal   Cardiographics ECG: normal sinus rhythm, no blocks or conduction defects, no ischemic changes Echocardiogram: not done   Lab Review  No visits with results within 6 Month(s) from this visit.  Latest known visit with results is:  Office Visit on 07/30/2021  Component Date Value   WBC 07/30/2021 4.5    RBC 07/30/2021 4.33    Hemoglobin 07/30/2021 11.6 (L)    HCT 07/30/2021 35.5 (L)    MCV 07/30/2021 81.8    MCHC 07/30/2021 32.8    RDW 07/30/2021 16.4 (H)    Platelets 07/30/2021 172.0    Neutrophils Relative % 07/30/2021 60.7    Lymphocytes Relative 07/30/2021 27.4    Monocytes Relative 07/30/2021 7.2    Eosinophils Relative 07/30/2021 4.3    Basophils Relative 07/30/2021 0.4    Neutro  Abs 07/30/2021 2.8    Lymphs Abs 07/30/2021 1.2    Monocytes Absolute 07/30/2021 0.3    Eosinophils Absolute 07/30/2021 0.2    Basophils Absolute 07/30/2021 0.0    Sodium 07/30/2021 141    Potassium 07/30/2021 3.9    Chloride 07/30/2021 106    CO2 07/30/2021 34 (H)    Glucose, Bld 07/30/2021 100 (H)    BUN 07/30/2021 23    Creatinine, Ser 07/30/2021 1.25    Total Bilirubin 07/30/2021 0.3    Alkaline Phosphatase 07/30/2021 56    AST 07/30/2021 19    ALT 07/30/2021 19    Total Protein 07/30/2021 7.4    Albumin 07/30/2021 4.3    GFR 07/30/2021 58.95 (L)    Calcium 07/30/2021 10.5    Hgb A1c MFr Bld 07/30/2021 6.6 (H)    Cholesterol 07/30/2021 157    Triglycerides  07/30/2021 210.0 (H)    HDL 07/30/2021 34.90 (L)    VLDL 07/30/2021 42.0 (H)    Total CHOL/HDL Ratio 07/30/2021 5    NonHDL 07/30/2021 122.51    Direct LDL 07/30/2021 79.0      Assessment:     Problem List Items Addressed This Visit       Cardiovascular and Mediastinum   Essential hypertension     Other   Dyslipidemia   Other Visit Diagnoses     Preoperative clearance    -  Primary        71 y.o. male with planned surgery as above.   Known risk factors for perioperative complications: None   Difficulty with intubation is not anticipated.  Cardiac Risk Estimation: Low risk  Current medications which may produce withdrawal symptoms if withheld perioperatively: .None    Plan:    1. Preoperative workup as follows ECG, labs to be done by preop team. 2. Change in medication regimen before surgery: none, continue medication regimen including morning of surgery, with sip of water. 3. Prophylaxis for cardiac events with perioperative beta-blockers: not indicated. 4. Invasive hemodynamic monitoring perioperatively: at the discretion of anesthesiologist. 5. Deep vein thrombosis prophylaxis postoperatively:regimen to be chosen by surgical team. 6. Surveillance for postoperative MI with ECG immediately postoperatively and on postoperative days 1 and 2 AND troponin levels 24 hours postoperatively and on day 4 or hospital discharge (whichever comes first): at the discretion of anesthesiologist. 7. Other measures:  None

## 2022-02-06 NOTE — Patient Instructions (Signed)
Health Maintenance After Age 69 After age 69, you are at a higher risk for certain long-term diseases and infections as well as injuries from falls. Falls are a major cause of broken bones and head injuries in people who are older than age 69. Getting regular preventive care can help to keep you healthy and well. Preventive care includes getting regular testing and making lifestyle changes as recommended by your health care provider. Talk with your health care provider about: Which screenings and tests you should have. A screening is a test that checks for a disease when you have no symptoms. A diet and exercise plan that is right for you. What should I know about screenings and tests to prevent falls? Screening and testing are the best ways to find a health problem early. Early diagnosis and treatment give you the best chance of managing medical conditions that are common after age 69. Certain conditions and lifestyle choices may make you more likely to have a fall. Your health care provider may recommend: Regular vision checks. Poor vision and conditions such as cataracts can make you more likely to have a fall. If you wear glasses, make sure to get your prescription updated if your vision changes. Medicine review. Work with your health care provider to regularly review all of the medicines you are taking, including over-the-counter medicines. Ask your health care provider about any side effects that may make you more likely to have a fall. Tell your health care provider if any medicines that you take make you feel dizzy or sleepy. Strength and balance checks. Your health care provider may recommend certain tests to check your strength and balance while standing, walking, or changing positions. Foot health exam. Foot pain and numbness, as well as not wearing proper footwear, can make you more likely to have a fall. Screenings, including: Osteoporosis screening. Osteoporosis is a condition that causes  the bones to get weaker and break more easily. Blood pressure screening. Blood pressure changes and medicines to control blood pressure can make you feel dizzy. Depression screening. You may be more likely to have a fall if you have a fear of falling, feel depressed, or feel unable to do activities that you used to do. Alcohol use screening. Using too much alcohol can affect your balance and may make you more likely to have a fall. Follow these instructions at home: Lifestyle Do not drink alcohol if: Your health care provider tells you not to drink. If you drink alcohol: Limit how much you have to: 0-1 drink a day for women. 0-2 drinks a day for men. Know how much alcohol is in your drink. In the [place] one drink equals one 12 oz bottle of beer (355 mL), one 5 oz glass of wine (148 mL), or one 1 oz glass of hard liquor (44 mL). Do not use any products that contain nicotine or tobacco. These products include cigarettes, chewing tobacco, and vaping devices, such as e-cigarettes. If you need help quitting, ask your health care provider. Activity  Follow a regular exercise program to stay fit. This will help you maintain your balance. Ask your health care provider what types of exercise are appropriate for you. If you need a cane or walker, use it as recommended by your health care provider. Wear supportive shoes that have nonskid soles. Safety  Remove any tripping hazards, such as rugs, cords, and clutter. Install safety equipment such as grab bars in bathrooms and safety rails on stairs. Keep rooms and walkways   well-lit. General instructions Talk with your health care provider about your risks for falling. Tell your health care provider if: You fall. Be sure to tell your health care provider about all falls, even ones that seem minor. You feel dizzy, tiredness (fatigue), or off-balance. Take over-the-counter and prescription medicines only as told by your health care provider. These include  supplements. Eat a healthy diet and maintain a healthy weight. A healthy diet includes low-fat dairy products, low-fat (lean) meats, and fiber from whole grains, beans, and lots of fruits and vegetables. Stay current with your vaccines. Schedule regular health, dental, and eye exams. Summary Having a healthy lifestyle and getting preventive care can help to protect your health and wellness after age 69. Screening and testing are the best way to find a health problem early and help you avoid having a fall. Early diagnosis and treatment give you the best chance for managing medical conditions that are more common for people who are older than age 69. Falls are a major cause of broken bones and head injuries in people who are older than age 69. Take precautions to prevent a fall at home. Work with your health care provider to learn what changes you can make to improve your health and wellness and to prevent falls. This information is not intended to replace advice given to you by your health care provider. Make sure you discuss any questions you have with your health care provider. Document Revised: 10/16/2020 Document Reviewed: 10/16/2020 Elsevier Patient Education  2023 [employer]  

## 2022-02-12 NOTE — Addendum Note (Signed)
Addended by: Rae Mar on: 02/12/2022 11:47 AM   Modules accepted: Orders

## 2022-02-13 ENCOUNTER — Telehealth: Payer: Medicare Other

## 2022-02-20 ENCOUNTER — Other Ambulatory Visit: Payer: Self-pay | Admitting: Emergency Medicine

## 2022-02-20 DIAGNOSIS — E785 Hyperlipidemia, unspecified: Secondary | ICD-10-CM

## 2022-02-25 DIAGNOSIS — N3020 Other chronic cystitis without hematuria: Secondary | ICD-10-CM | POA: Diagnosis not present

## 2022-02-25 NOTE — Progress Notes (Signed)
Surgery orders requested via Epic inbox. °

## 2022-02-27 NOTE — Progress Notes (Addendum)
Anesthesia Review:  PCP: DR Darnelle Catalan LOV 02/06/22 for preop clearance  Cardiologist : none  Chest x-ray :05/21/21- 2 view  EKG : 02/06/22  Echo : Stress test: Cardiac Cath :  Activity level: can do a flight of stairs without difficulty  Sleep Study/ CPAP : none  Fasting Blood Sugar :      / Checks Blood Sugar -- times a day:   Blood Thinner/ Instructions /Last Dose: ASA / Instructions/ Last Dose :   Prediabetes- does not check glucose at home  Hgba1c-03/04/22- 5.8  PT came at 1000am on 03/04/22 and had labs completed and picked up proep instructions along with G2 and hibiclens.  Med hx and preop instructions were completed via phone on 03/04/22.  PT voiced understanding.

## 2022-02-28 ENCOUNTER — Ambulatory Visit: Payer: Self-pay | Admitting: Physician Assistant

## 2022-02-28 DIAGNOSIS — M1711 Unilateral primary osteoarthritis, right knee: Secondary | ICD-10-CM | POA: Diagnosis not present

## 2022-02-28 DIAGNOSIS — G8929 Other chronic pain: Secondary | ICD-10-CM

## 2022-02-28 NOTE — H&P (Signed)
TOTAL KNEE ADMISSION H&P  Patient is being admitted for right total knee arthroplasty.  Subjective:  Chief Complaint:right knee pain.  HPI: [name] [name], 71 y.o. male, has a history of pain and functional disability in the right knee due to arthritis and has failed non-surgical conservative treatments for greater than 12 weeks to includeNSAID's and/or analgesics, corticosteriod injections, viscosupplementation injections, use of assistive devices, and activity modification.  Onset of symptoms was gradual, starting 8 years ago with gradually worsening course since that time. The patient noted no past surgery on the right knee(s).  Patient currently rates pain in the right knee(s) at 9 out of 10 with activity. Patient has night pain, worsening of pain with activity and weight bearing, pain that interferes with activities of daily living, pain with passive range of motion, crepitus, and joint swelling.  Patient has evidence of periarticular osteophytes and joint space narrowing by imaging studies. There is no active infection.  Patient Active Problem List   Diagnosis Date Noted   Body mass index (BMI) of 38.0-38.9 in adult 01/10/2020   Diverticulosis 01/10/2020   Prediabetes 01/10/2020   Dyslipidemia 01/10/2020   Malignant neoplasm of prostate ([place]) 06/08/2019   Prostate enlargement 08/29/2017   Gout 10/25/2014   Essential hypertension    Benign prostatic hyperplasia    Kidney stones    Prostate nodule    Past Medical History:  Diagnosis Date   Allergy    seasonal allergies   Anxiety    at times   Arthritis    generalized   Blood transfusion without reported diagnosis 2013   when had diverticulitis flare   BPH (benign prostatic hypertrophy)    Colon polyp    diverticular bleed with transfusion   Diverticulosis    Hyperlipidemia    on meds   Hypertension    on meds   Kidney stones    hx of 15-20 kidney stones    Obesity, unspecified    Prostate cancer ([place]) 2020   dx  05/2019   Prostate nodule [id number]    Past Surgical History:  Procedure Laterality Date   ANKLE FUSION Left 1991   COLONOSCOPY  2011   DB-F/V-miralax(good)-TICS/3 HPP   HERNIA REPAIR N/A    umbilical    POLYPECTOMY  2011   3 HPP   TRANSURETHRAL RESECTION OF PROSTATE  2020    Current Outpatient Medications  Medication Sig Dispense Refill Last Dose   ALPRAZolam (XANAX) 0.5 MG tablet TAKE 1 TABLET BY MOUTH EVERY DAY AS NEEDED FOR ANXIETY 20 tablet 1    cephALEXin (KEFLEX) 250 MG capsule TAKE 1 CAPSULE BY MOUTH EVERYDAY AT BEDTIME      ELDERBERRY PO Take 50 tablets by mouth.      lisinopril-hydrochlorothiazide (ZESTORETIC) 20-12.5 MG tablet TAKE 1 TABLET BY MOUTH EVERY DAY 90 tablet 3    LORazepam (ATIVAN) 1 MG tablet Take 1 mg by mouth daily. (Patient not taking: Reported on 02/06/2022)      multivitamin (ONE-A-DAY MEN'S) TABS tablet Take 1 tablet by mouth daily. (Patient not taking: Reported on 02/06/2022)      oxybutynin (DITROPAN) 5 MG tablet Take 5 mg by mouth at bedtime. (Patient not taking: Reported on 02/06/2022)      rosuvastatin (CRESTOR) 10 MG tablet TAKE 1 TABLET BY MOUTH EVERY DAY 90 tablet 1    No current facility-administered medications for this visit.   Allergies  Allergen Reactions   Aspirin     REACTION: upsets stomach   Statins  REACTION: UPSETS STOMACH   Gadolinium Derivatives Nausea And Vomiting    Pt was given 20 ml multihance and became nauseated and vomited several times.     Social History   Tobacco Use   Smoking status: Never   Smokeless tobacco: Never  Substance Use Topics   Alcohol use: No    Family History  Problem Relation Age of Onset   Diabetes Mother    Breast cancer Mother 21   Prostate cancer Father        patient did not have a relationship with his father but understands he had prostate ca   Colon polyps Father 34   Colon cancer Father 19   Diabetes Sister    Pancreatic cancer Neg Hx    Esophageal cancer Neg Hx    Stomach cancer  Neg Hx    Rectal cancer Neg Hx      Review of Systems  HENT:  Positive for nosebleeds.   Genitourinary:  Positive for dysuria, frequency, hematuria and urgency.  Musculoskeletal:  Positive for arthralgias.  All other systems reviewed and are negative.   Objective:  Physical Exam Constitutional:      General: He is not in acute distress.    Appearance: Normal appearance.  HENT:     Head: Normocephalic and atraumatic.  Eyes:     Extraocular Movements: Extraocular movements intact.     Pupils: Pupils are equal, round, and reactive to light.  Cardiovascular:     Rate and Rhythm: Normal rate and regular rhythm.     Pulses: Normal pulses.     Heart sounds: Normal heart sounds.  Pulmonary:     Effort: Pulmonary effort is normal. No respiratory distress.     Breath sounds: Normal breath sounds. No wheezing.  Abdominal:     General: Abdomen is flat. Bowel sounds are normal. There is no distension.     Palpations: Abdomen is soft.     Tenderness: There is no abdominal tenderness.  Musculoskeletal:     Cervical back: Normal range of motion and neck supple.     Right knee: Swelling and bony tenderness present. No effusion. Tenderness present.  Lymphadenopathy:     Cervical: No cervical adenopathy.  Skin:    General: Skin is warm and dry.     Findings: No erythema or rash.  Neurological:     General: No focal deficit present.     Mental Status: He is alert and oriented to person, place, and time.  Psychiatric:        Mood and Affect: Mood normal.        Behavior: Behavior normal.     Vital signs in last 24 hours: '@VSRANGES'$ @  Labs:   Estimated body mass index is 38.65 kg/m as calculated from the following:   Height as of 02/06/22: '6\' 2"'$  (1.88 m).   Weight as of 02/06/22: 136.5 kg.   Imaging Review Plain radiographs demonstrate moderate degenerative joint disease of the right knee(s). The overall alignment ismild varus. The bone quality appears to be good for age and  reported activity level.      Assessment/Plan:  End stage arthritis, right knee   The patient history, physical examination, clinical judgment of the provider and imaging studies are consistent with end stage degenerative joint disease of the right knee(s) and total knee arthroplasty is deemed medically necessary. The treatment options including medical management, injection therapy arthroscopy and arthroplasty were discussed at length. The risks and benefits of total knee arthroplasty were presented  and reviewed. The risks due to aseptic loosening, infection, stiffness, patella tracking problems, thromboembolic complications and other imponderables were discussed. The patient acknowledged the explanation, agreed to proceed with the plan and consent was signed. Patient is being admitted for inpatient treatment for surgery, pain control, PT, OT, prophylactic antibiotics, VTE prophylaxis, progressive ambulation and ADL's and discharge planning. The patient is planning to be discharged  home with outpt PT.     Patient's anticipated LOS is less than 2 midnights, meeting these requirements: - Younger than 50 - Lives within 1 hour of care - Has a competent adult at home to recover with post-op recover - NO history of  - Chronic pain requiring opiods  - Diabetes  - Coronary Artery Disease  - Heart failure  - Heart attack  - Stroke  - DVT/VTE  - Cardiac arrhythmia  - Respiratory Failure/COPD  - Renal failure  - Anemia  - Advanced Liver disease

## 2022-02-28 NOTE — H&P (View-Only) (Signed)
TOTAL KNEE ADMISSION H&P  Patient is being admitted for right total knee arthroplasty.  Subjective:  Chief Complaint:right knee pain.  HPI: [name] [name], 71 y.o. male, has a history of pain and functional disability in the right knee due to arthritis and has failed non-surgical conservative treatments for greater than 12 weeks to includeNSAID's and/or analgesics, corticosteriod injections, viscosupplementation injections, use of assistive devices, and activity modification.  Onset of symptoms was gradual, starting 8 years ago with gradually worsening course since that time. The patient noted no past surgery on the right knee(s).  Patient currently rates pain in the right knee(s) at 9 out of 10 with activity. Patient has night pain, worsening of pain with activity and weight bearing, pain that interferes with activities of daily living, pain with passive range of motion, crepitus, and joint swelling.  Patient has evidence of periarticular osteophytes and joint space narrowing by imaging studies. There is no active infection.  Patient Active Problem List   Diagnosis Date Noted   Body mass index (BMI) of 38.0-38.9 in adult 01/10/2020   Diverticulosis 01/10/2020   Prediabetes 01/10/2020   Dyslipidemia 01/10/2020   Malignant neoplasm of prostate ([place]) 06/08/2019   Prostate enlargement 08/29/2017   Gout 10/25/2014   Essential hypertension    Benign prostatic hyperplasia    Kidney stones    Prostate nodule    Past Medical History:  Diagnosis Date   Allergy    seasonal allergies   Anxiety    at times   Arthritis    generalized   Blood transfusion without reported diagnosis 2013   when had diverticulitis flare   BPH (benign prostatic hypertrophy)    Colon polyp    diverticular bleed with transfusion   Diverticulosis    Hyperlipidemia    on meds   Hypertension    on meds   Kidney stones    hx of 15-20 kidney stones    Obesity, unspecified    Prostate cancer ([place]) 2020   dx  05/2019   Prostate nodule [id number]    Past Surgical History:  Procedure Laterality Date   ANKLE FUSION Left 1991   COLONOSCOPY  2011   DB-F/V-miralax(good)-TICS/3 HPP   HERNIA REPAIR N/A    umbilical    POLYPECTOMY  2011   3 HPP   TRANSURETHRAL RESECTION OF PROSTATE  2020    Current Outpatient Medications  Medication Sig Dispense Refill Last Dose   ALPRAZolam (XANAX) 0.5 MG tablet TAKE 1 TABLET BY MOUTH EVERY DAY AS NEEDED FOR ANXIETY 20 tablet 1    cephALEXin (KEFLEX) 250 MG capsule TAKE 1 CAPSULE BY MOUTH EVERYDAY AT BEDTIME      ELDERBERRY PO Take 50 tablets by mouth.      lisinopril-hydrochlorothiazide (ZESTORETIC) 20-12.5 MG tablet TAKE 1 TABLET BY MOUTH EVERY DAY 90 tablet 3    LORazepam (ATIVAN) 1 MG tablet Take 1 mg by mouth daily. (Patient not taking: Reported on 02/06/2022)      multivitamin (ONE-A-DAY MEN'S) TABS tablet Take 1 tablet by mouth daily. (Patient not taking: Reported on 02/06/2022)      oxybutynin (DITROPAN) 5 MG tablet Take 5 mg by mouth at bedtime. (Patient not taking: Reported on 02/06/2022)      rosuvastatin (CRESTOR) 10 MG tablet TAKE 1 TABLET BY MOUTH EVERY DAY 90 tablet 1    No current facility-administered medications for this visit.   Allergies  Allergen Reactions   Aspirin     REACTION: upsets stomach   Statins  REACTION: UPSETS STOMACH   Gadolinium Derivatives Nausea And Vomiting    Pt was given 20 ml multihance and became nauseated and vomited several times.     Social History   Tobacco Use   Smoking status: Never   Smokeless tobacco: Never  Substance Use Topics   Alcohol use: No    Family History  Problem Relation Age of Onset   Diabetes Mother    Breast cancer Mother 81   Prostate cancer Father        patient did not have a relationship with his father but understands he had prostate ca   Colon polyps Father 47   Colon cancer Father 24   Diabetes Sister    Pancreatic cancer Neg Hx    Esophageal cancer Neg Hx    Stomach cancer  Neg Hx    Rectal cancer Neg Hx      Review of Systems  HENT:  Positive for nosebleeds.   Genitourinary:  Positive for dysuria, frequency, hematuria and urgency.  Musculoskeletal:  Positive for arthralgias.  All other systems reviewed and are negative.   Objective:  Physical Exam Constitutional:      General: He is not in acute distress.    Appearance: Normal appearance.  HENT:     Head: Normocephalic and atraumatic.  Eyes:     Extraocular Movements: Extraocular movements intact.     Pupils: Pupils are equal, round, and reactive to light.  Cardiovascular:     Rate and Rhythm: Normal rate and regular rhythm.     Pulses: Normal pulses.     Heart sounds: Normal heart sounds.  Pulmonary:     Effort: Pulmonary effort is normal. No respiratory distress.     Breath sounds: Normal breath sounds. No wheezing.  Abdominal:     General: Abdomen is flat. Bowel sounds are normal. There is no distension.     Palpations: Abdomen is soft.     Tenderness: There is no abdominal tenderness.  Musculoskeletal:     Cervical back: Normal range of motion and neck supple.     Right knee: Swelling and bony tenderness present. No effusion. Tenderness present.  Lymphadenopathy:     Cervical: No cervical adenopathy.  Skin:    General: Skin is warm and dry.     Findings: No erythema or rash.  Neurological:     General: No focal deficit present.     Mental Status: He is alert and oriented to person, place, and time.  Psychiatric:        Mood and Affect: Mood normal.        Behavior: Behavior normal.     Vital signs in last 24 hours: '@VSRANGES'$ @  Labs:   Estimated body mass index is 38.65 kg/m as calculated from the following:   Height as of 02/06/22: '6\' 2"'$  (1.88 m).   Weight as of 02/06/22: 136.5 kg.   Imaging Review Plain radiographs demonstrate moderate degenerative joint disease of the right knee(s). The overall alignment ismild varus. The bone quality appears to be good for age and  reported activity level.      Assessment/Plan:  End stage arthritis, right knee   The patient history, physical examination, clinical judgment of the provider and imaging studies are consistent with end stage degenerative joint disease of the right knee(s) and total knee arthroplasty is deemed medically necessary. The treatment options including medical management, injection therapy arthroscopy and arthroplasty were discussed at length. The risks and benefits of total knee arthroplasty were presented  and reviewed. The risks due to aseptic loosening, infection, stiffness, patella tracking problems, thromboembolic complications and other imponderables were discussed. The patient acknowledged the explanation, agreed to proceed with the plan and consent was signed. Patient is being admitted for inpatient treatment for surgery, pain control, PT, OT, prophylactic antibiotics, VTE prophylaxis, progressive ambulation and ADL's and discharge planning. The patient is planning to be discharged  home with outpt PT.     Patient's anticipated LOS is less than 2 midnights, meeting these requirements: - Younger than 43 - Lives within 1 hour of care - Has a competent adult at home to recover with post-op recover - NO history of  - Chronic pain requiring opiods  - Diabetes  - Coronary Artery Disease  - Heart failure  - Heart attack  - Stroke  - DVT/VTE  - Cardiac arrhythmia  - Respiratory Failure/COPD  - Renal failure  - Anemia  - Advanced Liver disease

## 2022-03-01 NOTE — Patient Instructions (Signed)
SURGICAL WAITING ROOM VISITATION Patients having surgery or a procedure may have no more than 2 support people in the waiting area - these visitors may rotate.   Children under the age of 6 must have an adult with them who is not the patient. If the patient needs to stay at the hospital during part of their recovery, the visitor guidelines for inpatient rooms apply. Pre-op nurse will coordinate an appropriate time for 1 support person to accompany patient in pre-op.  This support person may not rotate.    Please refer to the [facility] website for the visitor guidelines for Inpatients (after your surgery is over and you are in a regular room).       Your procedure is scheduled on:  03/15/2022    Report to [facility] Main Entrance    Report to admitting at  [place] AM   Call this number if you have problems the morning of surgery [phone]   Do not eat food :After Midnight.   After Midnight you may have the following liquids until  0430 AM/DAY OF SURGERY  Water Non-Citrus Juices (without pulp, NO RED) Carbonated Beverages Black Coffee (NO MILK/CREAM OR CREAMERS, sugar ok)  Clear Tea (NO MILK/CREAM OR CREAMERS, sugar ok) regular and decaf                             Plain Jell-O (NO RED)                                           Fruit ices (not with fruit pulp, NO RED)                                     Popsicles (NO RED)                                                               Sports drinks like Gatorade (NO RED)                    The day of surgery:  Drink ONE (1) Pre-Surgery G2 at AM the morning of surgery. Drink in one sitting. Do not sip.  This drink was given to you during your hospital  pre-op appointment visit. Nothing else to drink after completing the Pre-Surgery G2.          If you have questions, please contact your surgeon's office.        Oral Hygiene is also important to reduce your risk of infection.                                     Remember - BRUSH YOUR TEETH THE MORNING OF SURGERY WITH YOUR REGULAR TOOTHPASTE   Do NOT smoke after Midnight   Take these medicines the morning of surgery with A SIP OF WATER:  NONE   DO NOT TAKE ANY ORAL DIABETIC MEDICATIONS DAY OF YOUR SURGERY  Bring CPAP mask and tubing day  of surgery.                              You may not have any metal on your body including hair pins, jewelry, and body piercing             Do not wear make-up, lotions, powders, perfumes/cologne, or deodorant  Do not wear nail polish including gel and S&S, artificial/acrylic nails, or any other type of covering on natural nails including finger and toenails. If you have artificial nails, gel coating, etc. that needs to be removed by a nail salon please have this removed prior to surgery or surgery may need to be canceled/ delayed if the surgeon/ anesthesia feels like they are unable to be safely monitored.   Do not shave  48 hours prior to surgery.               Men may shave face and neck.   Do not bring valuables to the hospital. [place].   Contacts, dentures or bridgework may not be worn into surgery.   Bring small overnight bag day of surgery.   DO NOT [place]. PHARMACY WILL DISPENSE MEDICATIONS LISTED ON YOUR MEDICATION LIST TO YOU DURING YOUR ADMISSION [place]!    Patients discharged on the day of surgery will not be allowed to drive home.  Someone NEEDS to stay with you for the first 24 hours after anesthesia.   Special Instructions: Bring a copy of your healthcare power of attorney and living will documents the day of surgery if you haven't scanned them before.              Please read over the following fact sheets you were given: IF [place] [phone]   If you received a COVID test during your pre-op visit  it is requested that you wear a mask  when out in public, stay away from anyone that may not be feeling well and notify your surgeon if you develop symptoms. If you test positive for Covid or have been in contact with anyone that has tested positive in the last 10 days please notify you surgeon.     [facility] - Preparing for Surgery Before surgery, you can play an important role.  Because skin is not sterile, your skin needs to be as free of germs as possible.  You can reduce the number of germs on your skin by washing with CHG (chlorahexidine gluconate) soap before surgery.  CHG is an antiseptic cleaner which kills germs and bonds with the skin to continue killing germs even after washing. Please DO NOT use if you have an allergy to CHG or antibacterial soaps.  If your skin becomes reddened/irritated stop using the CHG and inform your nurse when you arrive at Short Stay. Do not shave (including legs and underarms) for at least 48 hours prior to the first CHG shower.  You may shave your face/neck. Please follow these instructions carefully:  1.  Shower with CHG Soap the night before surgery and the  morning of Surgery.  2.  If you choose to wash your hair, wash your hair first as usual with your  normal  shampoo.  3.  After you shampoo, rinse your hair and body thoroughly  to remove the  shampoo.                           4.  Use CHG as you would any other liquid soap.  You can apply chg directly  to the skin and wash                       Gently with a scrungie or clean washcloth.  5.  Apply the CHG Soap to your body ONLY FROM THE NECK DOWN.   Do not use on face/ open                           Wound or open sores. Avoid contact with eyes, ears mouth and genitals (private parts).                       Wash face,  Genitals (private parts) with your normal soap.             6.  Wash thoroughly, paying special attention to the area where your surgery  will be performed.  7.  Thoroughly rinse your body with warm water from the neck  down.  8.  DO NOT shower/wash with your normal soap after using and rinsing off  the CHG Soap.                9.  Pat yourself dry with a clean towel.            10.  Wear clean pajamas.            11.  Place clean sheets on your bed the night of your first shower and do not  sleep with pets. Day of Surgery : Do not apply any lotions/deodorants the morning of surgery.  Please wear clean clothes to the hospital/surgery center.  FAILURE TO FOLLOW THESE INSTRUCTIONS MAY RESULT IN THE CANCELLATION OF YOUR SURGERY PATIENT SIGNATURE_________________________________  NURSE SIGNATURE__________________________________  ________________________________________________________________________

## 2022-03-01 NOTE — Care Plan (Signed)
Ortho Bundle Case Management Note  Patient Details  Name: [name] [name] MRN: [id number] Date of Birth: February 05, 1951  met with patient in the office for H&P. He will discharge to home with family to assist. rolling walker and CPM ordered for home. His daughter is a CMA with our office. OPPT set up with [facility]. discharge instructions discussed and questions answered. appointment confirmed. Patient and MD in agreement with plan. Choice offered.                     DME Arranged:  Walker rolling, CPM DME Agency:     HH Arranged:    HH Agency:     Additional Comments: Please contact me with any questions of if this plan should need to change.  Ladell Heads,  [facility] Specialist  [phone] 03/01/2022, 11:46 AM

## 2022-03-04 ENCOUNTER — Encounter (HOSPITAL_COMMUNITY): Payer: Self-pay

## 2022-03-04 ENCOUNTER — Encounter (HOSPITAL_COMMUNITY)
Admission: RE | Admit: 2022-03-04 | Discharge: 2022-03-04 | Disposition: A | Discharge status: Home / Self Care | Payer: Medicare Other | Source: Ambulatory Visit | Attending: Orthopedic Surgery | Admitting: Orthopedic Surgery

## 2022-03-04 ENCOUNTER — Other Ambulatory Visit: Payer: Self-pay

## 2022-03-04 VITALS — BP 149/88 | HR 59 | Temp 98.4°F | Resp 17 | Ht 74.0 in | Wt 287.0 lb

## 2022-03-04 DIAGNOSIS — M25561 Pain in right knee: Secondary | ICD-10-CM | POA: Diagnosis not present

## 2022-03-04 DIAGNOSIS — R7303 Prediabetes: Secondary | ICD-10-CM | POA: Insufficient documentation

## 2022-03-04 DIAGNOSIS — G8929 Other chronic pain: Secondary | ICD-10-CM | POA: Insufficient documentation

## 2022-03-04 DIAGNOSIS — Z01812 Encounter for preprocedural laboratory examination: Secondary | ICD-10-CM | POA: Diagnosis not present

## 2022-03-04 DIAGNOSIS — Z01818 Encounter for other preprocedural examination: Secondary | ICD-10-CM

## 2022-03-04 HISTORY — DX: Prediabetes: R73.03

## 2022-03-04 LAB — CBC WITH DIFFERENTIAL/PLATELET
Abs Immature Granulocytes: 0.02 10*3/uL (ref 0.00–0.07)
Basophils Absolute: 0 10*3/uL (ref 0.0–0.1)
Basophils Relative: 1 %
Eosinophils Absolute: 0.2 10*3/uL (ref 0.0–0.5)
Eosinophils Relative: 4 %
HCT: 38 % — ABNORMAL LOW (ref 39.0–52.0)
Hemoglobin: 11.7 g/dL — ABNORMAL LOW (ref 13.0–17.0)
Immature Granulocytes: 1 %
Lymphocytes Relative: 32 %
Lymphs Abs: 1.4 10*3/uL (ref 0.7–4.0)
MCH: 26.6 pg (ref 26.0–34.0)
MCHC: 30.8 g/dL (ref 30.0–36.0)
MCV: 86.4 fL (ref 80.0–100.0)
Monocytes Absolute: 0.3 10*3/uL (ref 0.1–1.0)
Monocytes Relative: 8 %
Neutro Abs: 2.4 10*3/uL (ref 1.7–7.7)
Neutrophils Relative %: 54 %
Platelets: 207 10*3/uL (ref 150–400)
RBC: 4.4 MIL/uL (ref 4.22–5.81)
RDW: 15.2 % (ref 11.5–15.5)
WBC: 4.3 10*3/uL (ref 4.0–10.5)
nRBC: 0 % (ref 0.0–0.2)

## 2022-03-04 LAB — COMPREHENSIVE METABOLIC PANEL
ALT: 16 U/L (ref 0–44)
AST: 20 U/L (ref 15–41)
Albumin: 4.2 g/dL (ref 3.5–5.0)
Alkaline Phosphatase: 49 U/L (ref 38–126)
Anion gap: 6 (ref 5–15)
BUN: 18 mg/dL (ref 8–23)
CO2: 25 mmol/L (ref 22–32)
Calcium: 10.1 mg/dL (ref 8.9–10.3)
Chloride: 110 mmol/L (ref 98–111)
Creatinine, Ser: 1.07 mg/dL (ref 0.61–1.24)
GFR, Estimated: >60 mL/min (ref >60)
Glucose, Bld: 95 mg/dL (ref 70–99)
Potassium: 4.2 mmol/L (ref 3.5–5.1)
Sodium: 141 mmol/L (ref 135–145)
Total Bilirubin: 0.6 mg/dL (ref 0.3–1.2)
Total Protein: 8 g/dL (ref 6.5–8.1)

## 2022-03-04 LAB — HEMOGLOBIN A1C
Hgb A1c MFr Bld: 5.8 % — ABNORMAL HIGH (ref 4.8–5.6)
Mean Plasma Glucose: 119.76 mg/dL

## 2022-03-04 LAB — SURGICAL PCR SCREEN
MRSA, PCR: NEGATIVE
Staphylococcus aureus: NEGATIVE

## 2022-03-04 LAB — GLUCOSE, CAPILLARY: Glucose-Capillary: 96 mg/dL (ref 70–99)

## 2022-03-14 MED ORDER — TRANEXAMIC ACID 1000 MG/10ML IV SOLN
2000.0000 mg | INTRAVENOUS | Status: DC
  Filled 2022-03-14: qty 20

## 2022-03-15 ENCOUNTER — Ambulatory Visit (HOSPITAL_BASED_OUTPATIENT_CLINIC_OR_DEPARTMENT_OTHER): Payer: Medicare Other | Admitting: Anesthesiology

## 2022-03-15 ENCOUNTER — Encounter (HOSPITAL_COMMUNITY)
Admission: RE | Disposition: A | Discharge status: Home / Self Care | Payer: Self-pay | Source: Ambulatory Visit | Attending: Orthopedic Surgery

## 2022-03-15 ENCOUNTER — Ambulatory Visit (HOSPITAL_COMMUNITY): Payer: Medicare Other | Admitting: Physician Assistant

## 2022-03-15 ENCOUNTER — Observation Stay (HOSPITAL_COMMUNITY)
Admission: RE | Admit: 2022-03-15 | Discharge: 2022-03-16 | Disposition: A | Discharge status: Home / Self Care | Payer: Medicare Other | Source: Ambulatory Visit | Attending: Orthopedic Surgery | Admitting: Orthopedic Surgery

## 2022-03-15 ENCOUNTER — Encounter (HOSPITAL_COMMUNITY): Payer: Self-pay | Admitting: Orthopedic Surgery

## 2022-03-15 ENCOUNTER — Other Ambulatory Visit: Payer: Self-pay

## 2022-03-15 DIAGNOSIS — N289 Disorder of kidney and ureter, unspecified: Secondary | ICD-10-CM | POA: Diagnosis not present

## 2022-03-15 DIAGNOSIS — I10 Essential (primary) hypertension: Secondary | ICD-10-CM | POA: Diagnosis not present

## 2022-03-15 DIAGNOSIS — M1711 Unilateral primary osteoarthritis, right knee: Principal | ICD-10-CM | POA: Insufficient documentation

## 2022-03-15 DIAGNOSIS — Z79899 Other long term (current) drug therapy: Secondary | ICD-10-CM | POA: Diagnosis not present

## 2022-03-15 DIAGNOSIS — Z8546 Personal history of malignant neoplasm of prostate: Secondary | ICD-10-CM | POA: Insufficient documentation

## 2022-03-15 DIAGNOSIS — R7303 Prediabetes: Secondary | ICD-10-CM

## 2022-03-15 DIAGNOSIS — Z96651 Presence of right artificial knee joint: Secondary | ICD-10-CM

## 2022-03-15 DIAGNOSIS — Z01818 Encounter for other preprocedural examination: Secondary | ICD-10-CM

## 2022-03-15 DIAGNOSIS — G8918 Other acute postprocedural pain: Secondary | ICD-10-CM | POA: Diagnosis not present

## 2022-03-15 DIAGNOSIS — M21161 Varus deformity, not elsewhere classified, right knee: Secondary | ICD-10-CM | POA: Diagnosis not present

## 2022-03-15 HISTORY — PX: TOTAL KNEE ARTHROPLASTY: SHX125

## 2022-03-15 LAB — TYPE AND SCREEN
ABO/RH(D): O POS
Antibody Screen: NEGATIVE

## 2022-03-15 SURGERY — ARTHROPLASTY, KNEE, TOTAL
Anesthesia: Spinal | Site: Knee | Laterality: Right

## 2022-03-15 MED ORDER — DEXAMETHASONE SODIUM PHOSPHATE 10 MG/ML IJ SOLN
INTRAMUSCULAR | Status: AC
  Filled 2022-03-15: qty 1

## 2022-03-15 MED ORDER — EPHEDRINE 5 MG/ML INJ
INTRAVENOUS | Status: AC
  Filled 2022-03-15: qty 5

## 2022-03-15 MED ORDER — FENTANYL CITRATE (PF) 100 MCG/2ML IJ SOLN
INTRAMUSCULAR | Status: DC | PRN
  Administered 2022-03-15 (×2): 50 ug via INTRAVENOUS

## 2022-03-15 MED ORDER — ACETAMINOPHEN 10 MG/ML IV SOLN: 1000.0000 mg | Freq: Once | INTRAVENOUS | Status: DC | PRN

## 2022-03-15 MED ORDER — TRANEXAMIC ACID-NACL 1000-0.7 MG/100ML-% IV SOLN
1000.0000 mg | Freq: Once | INTRAVENOUS | Status: AC
  Administered 2022-03-15: 1000 mg via INTRAVENOUS

## 2022-03-15 MED ORDER — PROPOFOL 500 MG/50ML IV EMUL
INTRAVENOUS | Status: DC | PRN
  Administered 2022-03-15: 50 ug/kg/min via INTRAVENOUS

## 2022-03-15 MED ORDER — ACETAMINOPHEN 160 MG/5ML PO SOLN: 325.0000 mg | Freq: Once | ORAL | Status: DC | PRN

## 2022-03-15 MED ORDER — MIDAZOLAM HCL 2 MG/2ML IJ SOLN
INTRAMUSCULAR | Status: AC
  Filled 2022-03-15: qty 2

## 2022-03-15 MED ORDER — SODIUM CHLORIDE 0.9% FLUSH
INTRAVENOUS | Status: DC | PRN
  Administered 2022-03-15: 50 mL

## 2022-03-15 MED ORDER — CHLORHEXIDINE GLUCONATE 0.12 % MT SOLN
15.0000 mL | Freq: Once | OROMUCOSAL | Status: AC
  Administered 2022-03-15: 15 mL via OROMUCOSAL

## 2022-03-15 MED ORDER — ONDANSETRON HCL 4 MG/2ML IJ SOLN
4.0000 mg | Freq: Four times a day (QID) | INTRAMUSCULAR | Status: DC | PRN
  Administered 2022-03-15: 4 mg via INTRAVENOUS

## 2022-03-15 MED ORDER — POVIDONE-IODINE 10 % EX SWAB
2.0000 | Freq: Once | CUTANEOUS | Status: AC
  Administered 2022-03-15: 2 via TOPICAL

## 2022-03-15 MED ORDER — HYDROMORPHONE HCL 1 MG/ML IJ SOLN: 0.2500 mg | INTRAMUSCULAR | Status: DC | PRN

## 2022-03-15 MED ORDER — PHENOL 1.4 % MT LIQD: 1.0000 | OROMUCOSAL | Status: DC | PRN

## 2022-03-15 MED ORDER — LISINOPRIL-HYDROCHLOROTHIAZIDE 20-12.5 MG PO TABS: 1.0000 | ORAL_TABLET | Freq: Every day | ORAL | Status: DC

## 2022-03-15 MED ORDER — GLYCOPYRROLATE 0.2 MG/ML IJ SOLN
INTRAMUSCULAR | Status: DC | PRN
  Administered 2022-03-15: .1 mg via INTRAVENOUS

## 2022-03-15 MED ORDER — TRANEXAMIC ACID 1000 MG/10ML IV SOLN
INTRAVENOUS | Status: DC | PRN
  Administered 2022-03-15: 2000 mg via TOPICAL

## 2022-03-15 MED ORDER — ACETAMINOPHEN 500 MG PO TABS
1000.0000 mg | ORAL_TABLET | Freq: Four times a day (QID) | ORAL | Status: AC
  Administered 2022-03-15 – 2022-03-16 (×4): 1000 mg via ORAL
  Filled 2022-03-15 (×3): qty 2

## 2022-03-15 MED ORDER — BUPIVACAINE IN DEXTROSE 0.75-8.25 % IT SOLN
INTRATHECAL | Status: DC | PRN
  Administered 2022-03-15: 1.6 mL via INTRATHECAL

## 2022-03-15 MED ORDER — ONDANSETRON HCL 4 MG/2ML IJ SOLN
INTRAMUSCULAR | Status: AC
  Filled 2022-03-15: qty 2

## 2022-03-15 MED ORDER — ACETAMINOPHEN 500 MG PO TABS
1000.0000 mg | ORAL_TABLET | Freq: Once | ORAL | Status: AC
  Administered 2022-03-15: 1000 mg via ORAL
  Filled 2022-03-15: qty 2

## 2022-03-15 MED ORDER — PROPOFOL 10 MG/ML IV BOLUS
INTRAVENOUS | Status: DC | PRN
  Administered 2022-03-15: 10 mg via INTRAVENOUS

## 2022-03-15 MED ORDER — ROSUVASTATIN CALCIUM 10 MG PO TABS
10.0000 mg | ORAL_TABLET | Freq: Every day | ORAL | Status: DC
  Administered 2022-03-15 – 2022-03-16 (×2): 10 mg via ORAL
  Filled 2022-03-15 (×2): qty 1

## 2022-03-15 MED ORDER — MEPERIDINE HCL 50 MG/ML IJ SOLN: 6.2500 mg | INTRAMUSCULAR | Status: DC | PRN

## 2022-03-15 MED ORDER — 0.9 % SODIUM CHLORIDE (POUR BTL) OPTIME
TOPICAL | Status: DC | PRN
  Administered 2022-03-15: 1000 mL

## 2022-03-15 MED ORDER — TIZANIDINE HCL 4 MG PO TABS: 4.0000 mg | ORAL_TABLET | Freq: Three times a day (TID) | ORAL | 0 refills | Status: DC

## 2022-03-15 MED ORDER — PHENYLEPHRINE HCL-NACL 20-0.9 MG/250ML-% IV SOLN
INTRAVENOUS | Status: DC | PRN
  Administered 2022-03-15: 30 ug/min via INTRAVENOUS

## 2022-03-15 MED ORDER — BUPIVACAINE LIPOSOME 1.3 % IJ SUSP
INTRAMUSCULAR | Status: AC
  Filled 2022-03-15: qty 20

## 2022-03-15 MED ORDER — BUPIVACAINE-EPINEPHRINE (PF) 0.25% -1:200000 IJ SOLN
INTRAMUSCULAR | Status: DC | PRN
  Administered 2022-03-15: 30 mL

## 2022-03-15 MED ORDER — ASPIRIN 81 MG PO CHEW
81.0000 mg | CHEWABLE_TABLET | Freq: Two times a day (BID) | ORAL | Status: DC
  Administered 2022-03-15 – 2022-03-16 (×2): 81 mg via ORAL
  Filled 2022-03-15 (×2): qty 1

## 2022-03-15 MED ORDER — METOCLOPRAMIDE HCL 5 MG/ML IJ SOLN: 5.0000 mg | Freq: Three times a day (TID) | INTRAMUSCULAR | Status: DC | PRN

## 2022-03-15 MED ORDER — METOCLOPRAMIDE HCL 5 MG PO TABS: 5.0000 mg | ORAL_TABLET | Freq: Three times a day (TID) | ORAL | Status: DC | PRN

## 2022-03-15 MED ORDER — DEXMEDETOMIDINE HCL IN NACL 80 MCG/20ML IV SOLN
INTRAVENOUS | Status: DC | PRN
  Administered 2022-03-15: 4 ug via BUCCAL
  Administered 2022-03-15: 8 ug via BUCCAL
  Administered 2022-03-15: 4 ug via BUCCAL

## 2022-03-15 MED ORDER — EPHEDRINE SULFATE (PRESSORS) 50 MG/ML IJ SOLN
INTRAMUSCULAR | Status: DC | PRN
  Administered 2022-03-15: 5 mg via INTRAVENOUS

## 2022-03-15 MED ORDER — HYDROMORPHONE HCL 1 MG/ML IJ SOLN
0.5000 mg | INTRAMUSCULAR | Status: DC | PRN
  Administered 2022-03-15: 1 mg via INTRAVENOUS
  Filled 2022-03-15: qty 1

## 2022-03-15 MED ORDER — AMISULPRIDE (ANTIEMETIC) 5 MG/2ML IV SOLN: 10.0000 mg | Freq: Once | INTRAVENOUS | Status: DC | PRN

## 2022-03-15 MED ORDER — OXYCODONE HCL 5 MG PO TABS
5.0000 mg | ORAL_TABLET | ORAL | Status: DC | PRN
  Administered 2022-03-15 (×2): 10 mg via ORAL
  Administered 2022-03-16 (×2): 5 mg via ORAL
  Filled 2022-03-15: qty 2
  Filled 2022-03-15 (×2): qty 1

## 2022-03-15 MED ORDER — OXYCODONE HCL 5 MG PO TABS: ORAL_TABLET | ORAL | 0 refills | Status: DC

## 2022-03-15 MED ORDER — HYDROMORPHONE HCL 1 MG/ML IJ SOLN
INTRAMUSCULAR | Status: AC
  Administered 2022-03-15: 0.5 mg via INTRAVENOUS
  Filled 2022-03-15: qty 1

## 2022-03-15 MED ORDER — TRANEXAMIC ACID-NACL 1000-0.7 MG/100ML-% IV SOLN
INTRAVENOUS | Status: AC
  Filled 2022-03-15: qty 100

## 2022-03-15 MED ORDER — LACTATED RINGERS IV BOLUS
250.0000 mL | Freq: Once | INTRAVENOUS | Status: AC
  Administered 2022-03-15: 250 mL via INTRAVENOUS

## 2022-03-15 MED ORDER — FENTANYL CITRATE (PF) 100 MCG/2ML IJ SOLN
INTRAMUSCULAR | Status: AC
  Filled 2022-03-15: qty 2

## 2022-03-15 MED ORDER — POLYETHYLENE GLYCOL 3350 17 G PO PACK: 17.0000 g | PACK | Freq: Every day | ORAL | Status: DC | PRN

## 2022-03-15 MED ORDER — ORAL CARE MOUTH RINSE: 15.0000 mL | Freq: Once | OROMUCOSAL | Status: AC

## 2022-03-15 MED ORDER — OXYCODONE HCL 5 MG PO TABS
ORAL_TABLET | ORAL | Status: AC
  Administered 2022-03-15: 10 mg via ORAL
  Filled 2022-03-15: qty 2

## 2022-03-15 MED ORDER — LACTATED RINGERS IV SOLN: INTRAVENOUS | Status: DC

## 2022-03-15 MED ORDER — SODIUM CHLORIDE 0.9 % IR SOLN
Status: DC | PRN
  Administered 2022-03-15: 1000 mL

## 2022-03-15 MED ORDER — TRANEXAMIC ACID-NACL 1000-0.7 MG/100ML-% IV SOLN
1000.0000 mg | INTRAVENOUS | Status: AC
  Administered 2022-03-15: 1000 mg via INTRAVENOUS
  Filled 2022-03-15: qty 100

## 2022-03-15 MED ORDER — LACTATED RINGERS IV BOLUS
500.0000 mL | Freq: Once | INTRAVENOUS | Status: AC
  Administered 2022-03-15: 500 mL via INTRAVENOUS

## 2022-03-15 MED ORDER — SORBITOL 70 % SOLN: 30.0000 mL | Freq: Every day | Status: DC | PRN

## 2022-03-15 MED ORDER — OXYCODONE HCL 5 MG PO TABS
ORAL_TABLET | ORAL | Status: AC
  Filled 2022-03-15: qty 2

## 2022-03-15 MED ORDER — ONDANSETRON HCL 4 MG/2ML IJ SOLN
INTRAMUSCULAR | Status: DC | PRN
  Administered 2022-03-15: 4 mg via INTRAVENOUS

## 2022-03-15 MED ORDER — ACETAMINOPHEN 500 MG PO TABS
ORAL_TABLET | ORAL | Status: AC
  Filled 2022-03-15: qty 2

## 2022-03-15 MED ORDER — ACETAMINOPHEN 325 MG PO TABS
325.0000 mg | ORAL_TABLET | Freq: Four times a day (QID) | ORAL | Status: DC | PRN
  Administered 2022-03-16: 650 mg via ORAL
  Filled 2022-03-15: qty 2

## 2022-03-15 MED ORDER — MENTHOL 3 MG MT LOZG: 1.0000 | LOZENGE | OROMUCOSAL | Status: DC | PRN

## 2022-03-15 MED ORDER — HYDROCHLOROTHIAZIDE 12.5 MG PO TABS
12.5000 mg | ORAL_TABLET | Freq: Every day | ORAL | Status: DC
  Filled 2022-03-15: qty 1

## 2022-03-15 MED ORDER — ACETAMINOPHEN 500 MG PO TABS: 1000.0000 mg | ORAL_TABLET | Freq: Four times a day (QID) | ORAL | 2 refills | Status: AC | PRN

## 2022-03-15 MED ORDER — PHENYLEPHRINE HCL-NACL 20-0.9 MG/250ML-% IV SOLN
INTRAVENOUS | Status: AC
  Filled 2022-03-15: qty 250

## 2022-03-15 MED ORDER — DIPHENHYDRAMINE HCL 12.5 MG/5ML PO ELIX: 12.5000 mg | ORAL_SOLUTION | ORAL | Status: DC | PRN

## 2022-03-15 MED ORDER — CEFAZOLIN IN SODIUM CHLORIDE 3-0.9 GM/100ML-% IV SOLN
3.0000 g | INTRAVENOUS | Status: AC
  Administered 2022-03-15: 3 g via INTRAVENOUS
  Filled 2022-03-15: qty 100

## 2022-03-15 MED ORDER — ONDANSETRON HCL 4 MG PO TABS: 4.0000 mg | ORAL_TABLET | Freq: Four times a day (QID) | ORAL | Status: DC | PRN

## 2022-03-15 MED ORDER — MIDAZOLAM HCL 5 MG/5ML IJ SOLN
INTRAMUSCULAR | Status: DC | PRN
  Administered 2022-03-15 (×2): 1 mg via INTRAVENOUS

## 2022-03-15 MED ORDER — LIDOCAINE HCL (CARDIAC) PF 100 MG/5ML IV SOSY
PREFILLED_SYRINGE | INTRAVENOUS | Status: DC | PRN
  Administered 2022-03-15: 40 mg via INTRAVENOUS

## 2022-03-15 MED ORDER — ACETAMINOPHEN 325 MG PO TABS: 325.0000 mg | ORAL_TABLET | Freq: Once | ORAL | Status: DC | PRN

## 2022-03-15 MED ORDER — FLEET ENEMA 7-19 GM/118ML RE ENEM: 1.0000 | ENEMA | Freq: Once | RECTAL | Status: DC | PRN

## 2022-03-15 MED ORDER — SODIUM CHLORIDE (PF) 0.9 % IJ SOLN
INTRAMUSCULAR | Status: AC
  Filled 2022-03-15: qty 50

## 2022-03-15 MED ORDER — ROPIVACAINE HCL 5 MG/ML IJ SOLN
INTRAMUSCULAR | Status: DC | PRN
  Administered 2022-03-15: 30 mL via PERINEURAL

## 2022-03-15 MED ORDER — CEFAZOLIN SODIUM-DEXTROSE 2-4 GM/100ML-% IV SOLN
2.0000 g | Freq: Four times a day (QID) | INTRAVENOUS | Status: AC
  Administered 2022-03-15 (×2): 2 g via INTRAVENOUS
  Filled 2022-03-15: qty 100

## 2022-03-15 MED ORDER — DOCUSATE SODIUM 100 MG PO CAPS
100.0000 mg | ORAL_CAPSULE | Freq: Two times a day (BID) | ORAL | Status: DC
  Administered 2022-03-15 – 2022-03-16 (×2): 100 mg via ORAL
  Filled 2022-03-15 (×2): qty 1

## 2022-03-15 MED ORDER — CEFAZOLIN SODIUM-DEXTROSE 2-4 GM/100ML-% IV SOLN
INTRAVENOUS | Status: AC
  Filled 2022-03-15: qty 100

## 2022-03-15 MED ORDER — BUPIVACAINE LIPOSOME 1.3 % IJ SUSP
INTRAMUSCULAR | Status: DC | PRN
  Administered 2022-03-15: 20 mL

## 2022-03-15 MED ORDER — WATER FOR IRRIGATION, STERILE IR SOLN
Status: DC | PRN
  Administered 2022-03-15: 2000 mL

## 2022-03-15 MED ORDER — BUPIVACAINE LIPOSOME 1.3 % IJ SUSP: 20.0000 mL | Freq: Once | INTRAMUSCULAR | Status: DC

## 2022-03-15 MED ORDER — BUPIVACAINE-EPINEPHRINE (PF) 0.25% -1:200000 IJ SOLN
INTRAMUSCULAR | Status: AC
  Filled 2022-03-15: qty 30

## 2022-03-15 MED ORDER — PROMETHAZINE HCL 25 MG/ML IJ SOLN: 6.2500 mg | INTRAMUSCULAR | Status: DC | PRN

## 2022-03-15 MED ORDER — SODIUM CHLORIDE 0.9 % IV SOLN: INTRAVENOUS | Status: DC

## 2022-03-15 MED ORDER — ALPRAZOLAM 0.5 MG PO TABS: 0.5000 mg | ORAL_TABLET | Freq: Every day | ORAL | Status: DC | PRN

## 2022-03-15 MED ORDER — PROPOFOL 500 MG/50ML IV EMUL
INTRAVENOUS | Status: AC
  Filled 2022-03-15: qty 50

## 2022-03-15 MED ORDER — LISINOPRIL 20 MG PO TABS
20.0000 mg | ORAL_TABLET | Freq: Every day | ORAL | Status: DC
  Administered 2022-03-16: 20 mg via ORAL
  Filled 2022-03-15: qty 1

## 2022-03-15 MED ORDER — LIDOCAINE HCL (PF) 2 % IJ SOLN
INTRAMUSCULAR | Status: AC
  Filled 2022-03-15: qty 5

## 2022-03-15 MED ORDER — ASPIRIN 81 MG PO TBEC: 81.0000 mg | DELAYED_RELEASE_TABLET | Freq: Two times a day (BID) | ORAL | 0 refills | Status: AC

## 2022-03-15 MED ORDER — GLYCOPYRROLATE 0.2 MG/ML IJ SOLN
INTRAMUSCULAR | Status: AC
  Filled 2022-03-15: qty 1

## 2022-03-15 SURGICAL SUPPLY — 57 items
ATTUNE MED DOME PAT 38 KNEE (Knees) IMPLANT
ATTUNE PS FEM RT SZ 8 CEM KNEE (Femur) IMPLANT
ATTUNE PSRP INSR SZ8 6 KNEE (Insert) IMPLANT
BAG COUNTER SPONGE SURGICOUNT (BAG) ×1 IMPLANT
BAG DECANTER FOR FLEXI CONT (MISCELLANEOUS) ×1 IMPLANT
BAG ZIPLOCK 12X15 (MISCELLANEOUS) ×1 IMPLANT
BASE TIBIAL ATTUNE KNEE SZ9 (Knees) IMPLANT
BENZOIN TINCTURE PRP APPL 2/3 (GAUZE/BANDAGES/DRESSINGS) ×1 IMPLANT
BLADE SAGITTAL 25.0X1.19X90 (BLADE) ×1 IMPLANT
BLADE SAW SGTL 13X75X1.27 (BLADE) ×1 IMPLANT
BLADE SURG 15 STRL LF DISP TIS (BLADE) ×1 IMPLANT
BLADE SURG 15 STRL SS (BLADE) ×1
BLADE SURG SZ10 CARB STEEL (BLADE) ×2 IMPLANT
BNDG ELASTIC 6X15 VLCR STRL LF (GAUZE/BANDAGES/DRESSINGS) ×1 IMPLANT
BONE CEMENT GENTAMICIN (Cement) ×2 IMPLANT
BOWL SMART MIX CTS (DISPOSABLE) ×1 IMPLANT
CEMENT BONE GENTAMICIN 40 (Cement) IMPLANT
CLSR STERI-STRIP ANTIMIC 1/2X4 (GAUZE/BANDAGES/DRESSINGS) ×2 IMPLANT
COVER SURGICAL LIGHT HANDLE (MISCELLANEOUS) ×1 IMPLANT
CUFF TOURN SGL QUICK 34 (TOURNIQUET CUFF) ×1
CUFF TRNQT CYL 34X4.125X (TOURNIQUET CUFF) ×1 IMPLANT
DRAPE INCISE IOBAN 66X45 STRL (DRAPES) ×1 IMPLANT
DRAPE U-SHAPE 47X51 STRL (DRAPES) ×1 IMPLANT
DRESSING AQUACEL AG SP 3.5X10 (GAUZE/BANDAGES/DRESSINGS) ×1 IMPLANT
DRSG AQUACEL AG SP 3.5X10 (GAUZE/BANDAGES/DRESSINGS) ×1
DURAPREP 26ML APPLICATOR (WOUND CARE) ×2 IMPLANT
ELECT REM PT RETURN 15FT ADLT (MISCELLANEOUS) ×1 IMPLANT
GLOVE BIOGEL PI IND STRL 8 (GLOVE) ×2 IMPLANT
GLOVE SURG ORTHO 8.0 STRL STRW (GLOVE) ×1 IMPLANT
GLOVE SURG POLYISO LF SZ7.5 (GLOVE) ×1 IMPLANT
GOWN STRL REUS W/ TWL XL LVL3 (GOWN DISPOSABLE) ×2 IMPLANT
GOWN STRL REUS W/TWL XL LVL3 (GOWN DISPOSABLE) ×2
HANDPIECE INTERPULSE COAX TIP (DISPOSABLE) ×1
HOLDER FOLEY CATH W/STRAP (MISCELLANEOUS) IMPLANT
HOOD PEEL AWAY FLYTE STAYCOOL (MISCELLANEOUS) ×1 IMPLANT
IMMOBILIZER KNEE 20 (SOFTGOODS) ×1
IMMOBILIZER KNEE 20 THIGH 36 (SOFTGOODS) ×1 IMPLANT
KIT TURNOVER KIT A (KITS) IMPLANT
MANIFOLD NEPTUNE II (INSTRUMENTS) ×1 IMPLANT
NEEDLE HYPO 22GX1.5 SAFETY (NEEDLE) ×2 IMPLANT
NS IRRIG 1000ML POUR BTL (IV SOLUTION) ×1 IMPLANT
PACK TOTAL KNEE CUSTOM (KITS) ×1 IMPLANT
PROTECTOR NERVE ULNAR (MISCELLANEOUS) ×1 IMPLANT
SET HNDPC FAN SPRY TIP SCT (DISPOSABLE) ×1 IMPLANT
SPIKE FLUID TRANSFER (MISCELLANEOUS) ×2 IMPLANT
SUT ETHIBOND NAB CT1 #1 30IN (SUTURE) ×2 IMPLANT
SUT MNCRL AB 3-0 PS2 18 (SUTURE) ×1 IMPLANT
SUT VIC AB 0 CT1 36 (SUTURE) ×1 IMPLANT
SUT VIC AB 2-0 CT1 27 (SUTURE) ×2
SUT VIC AB 2-0 CT1 TAPERPNT 27 (SUTURE) ×2 IMPLANT
SYR CONTROL 10ML LL (SYRINGE) ×3 IMPLANT
TIBIAL BASE ATTUNE KNEE SZ9 (Knees) ×1 IMPLANT
TOWEL OR 17X26 10 PK STRL BLUE (TOWEL DISPOSABLE) ×1 IMPLANT
TRAY FOLEY MTR SLVR 16FR STAT (SET/KITS/TRAYS/PACK) ×1 IMPLANT
TUBE SUCTION HIGH CAP CLEAR NV (SUCTIONS) ×1 IMPLANT
WATER STERILE IRR 1000ML POUR (IV SOLUTION) ×2 IMPLANT
WRAP KNEE MAXI GEL POST OP (GAUZE/BANDAGES/DRESSINGS) ×1 IMPLANT

## 2022-03-15 NOTE — Transfer of Care (Signed)
Immediate Anesthesia Transfer of Care Note  Patient: [name] [name]  Procedure(s) Performed: TOTAL KNEE ARTHROPLASTY (Right: Knee)  Patient Location: PACU  Anesthesia Type:Spinal  Level of Consciousness: awake, alert , oriented and patient cooperative  Airway & Oxygen Therapy: Patient Spontanous Breathing and Patient connected to face mask oxygen  Post-op Assessment: Report given to RN and Post -op Vital signs reviewed and stable  Post vital signs: Reviewed and stable  Last Vitals:  Vitals Value Taken Time  BP 124/72 03/15/22 1008  Temp 36.4 C 03/15/22 1007  Pulse 65 03/15/22 1010  Resp 13 03/15/22 1010  SpO2 100 % 03/15/22 1010  Vitals shown include unvalidated device data.  Last Pain:  Vitals:   03/15/22 0600  TempSrc: Oral  PainSc:       Patients Stated Pain Goal: 3 (11/94/17 4081)  Complications: No notable events documented.

## 2022-03-15 NOTE — Progress Notes (Signed)
Orthopedic Tech Progress Note Patient Details:  [name] [name] Dec 06, 1950 [id number] Patient was given instructions on how to correctly use the bone foam.  Ortho Devices Type of Ortho Device: Bone foam zero knee Ortho Device/Splint Interventions: Ordered      [name] 03/15/2022, 12:04 PM

## 2022-03-15 NOTE — Op Note (Signed)
[name] [name], CATES MEDICAL RECORD NO: [id number] ACCOUNT NO: [id number] DATE OF BIRTH: 20-Nov-1950 FACILITY: [facility] LOCATION: [facility] PHYSICIAN: W D. Valeta Harms., MD  Operative Report   DATE OF PROCEDURE: 03/15/2022  POSTOPERATIVE DIAGNOSES:  Severe osteoarthritis right knee with flexion contracture and varus deformity.  POSTOPERATIVE DIAGNOSES:  Severe osteoarthritis right knee with flexion contracture and varus deformity.  OPERATION:  Right total knee replacement (DePuy cemented knee, size 8 femur, size 8 tibial bearing, 6 mm thickness size 9 tibia, 38 mm all poly patella).  SURGEON: W D. Valeta Harms., MD  ASSISTANTShirlean Schlein, PA.  ANESTHESIA:  General, block.  DESCRIPTION OF PROCEDURE:  Supine position, exsanguination of thigh tourniquet, inflation of the thigh tourniquet to 350.  Straight skin incision with a medial parapatellar approach to knee was made.  We did a 5-degree 10 mm cut on the femur, then  cutting about 3 mm below the most diseased medial compartment with stripping of the medial side of the knee due to the varus deformity.  Femur was sized to be a size 8.  Placement of the all-in-one cutting block in the appropriate degree of external  rotation accomplishing the anterior, posterior and chamfer cuts.  Complete release of the PCL was done with excision of the menisci.  We removed posterior osteophytes from the posterior aspect of the knee and infiltrated the capsular and soft tissues  with a mixture of Marcaine and Exparel.  Tibia was sized to be a size 9.  Placement of the keel cut for the tibia and the trial on the tibia and the femur after the box cut was made on the femur.  Section 9.5 mm of patella and placement of 38 mm all poly  trial.  With the trials in we noted resolution of the flexion contracture, varus deformity with good alignment and excellent stability.  Cement was prepared on the back table, elected to use antibiotic impregnated cement due to the patient's  history of  a recent UTI.  We inserted the final components in tibia, followed by femur, patella and allowed the cement to harden with the trial bearing in.  Trial bearing was removed.  Small bits of cement were removed from the knee.  Tourniquet was released under  direct vision, no excessive bleeding was noted.  Small bleeders were coagulated.  Final bearing was placed.  Closure was affected with #1 Ethibond, 2-0 Vicryl and Monocryl in the skin.  Taken to recovery room in stable condition.   VAI D: 03/15/2022 9:33:41 am T: 03/15/2022 9:48:00 am  JOB: [id number]/ [id number]

## 2022-03-15 NOTE — Anesthesia Preprocedure Evaluation (Addendum)
Anesthesia Evaluation  Patient identified by MRN, date of birth, ID band Patient awake    Reviewed: Allergy & Precautions, NPO status , Patient's Chart, lab work & pertinent test results  Airway Mallampati: III       Dental no notable dental hx. (+) Teeth Intact, Dental Advisory Given   Pulmonary neg pulmonary ROS,    breath sounds clear to auscultation       Cardiovascular hypertension, Pt. on medications Normal cardiovascular exam Rhythm:Regular Rate:Normal     Neuro/Psych negative neurological ROS  negative psych ROS   GI/Hepatic negative GI ROS, Neg liver ROS,   Endo/Other  negative endocrine ROS  Renal/GU Renal disease     Musculoskeletal  (+) Arthritis ,   Abdominal Normal abdominal exam  (+)   Peds  Hematology negative hematology ROS (+)   Anesthesia Other Findings   Reproductive/Obstetrics                            Anesthesia Physical Anesthesia Plan  ASA: 3  Anesthesia Plan: Spinal   Post-op Pain Management: Regional block*   Induction: Intravenous  PONV Risk Score and Plan: 2 and Ondansetron and Propofol infusion  Airway Management Planned: Natural Airway and Simple Face Mask  Additional Equipment: None  Intra-op Plan:   Post-operative Plan:   Informed Consent: I have reviewed the patients History and Physical, chart, labs and discussed the procedure including the risks, benefits and alternatives for the proposed anesthesia with the patient or authorized representative who has indicated his/her understanding and acceptance.     Dental advisory given  Plan Discussed with: CRNA  Anesthesia Plan Comments: (Lab Results      Component                Value               Date                      WBC                      4.3                 03/04/2022                HGB                      11.7 (L)            03/04/2022                HCT                      38.0  (L)            03/04/2022                MCV                      86.4                03/04/2022                PLT                      207                 03/04/2022           )  Anesthesia Quick Evaluation  

## 2022-03-15 NOTE — Anesthesia Procedure Notes (Signed)
Spinal  Start time: 03/15/2022 7:46 AM End time: 03/15/2022 7:48 AM Reason for block: surgical anesthesia Staffing Performed: anesthesiologist  Anesthesiologist: Effie Berkshire, MD Performed by: Effie Berkshire, MD Authorized by: Effie Berkshire, MD   Preanesthetic Checklist Completed: patient identified, IV checked, site marked, risks and benefits discussed, surgical consent, monitors and equipment checked, pre-op evaluation and timeout performed Spinal Block Patient position: sitting Prep: DuraPrep and site prepped and draped Location: L3-4 Injection technique: single-shot Needle Needle type: Pencan  Needle gauge: 24 G Needle length: 10 cm Needle insertion depth: 10 cm Additional Notes Patient tolerated well. No immediate complications.  Functioning IV was confirmed and monitors were applied. Sterile prep and drape, including hand hygiene and sterile gloves were used. The patient was positioned and the back was prepped. The skin was anesthetized with lidocaine. Free flow of clear CSF was obtained prior to injecting local anesthetic into the CSF. The spinal needle aspirated freely following injection. The needle was carefully withdrawn. The patient tolerated the procedure well.

## 2022-03-15 NOTE — Interval H&P Note (Signed)
History and Physical Interval Note:  03/15/2022 7:34 AM  [name] [name]  has presented today for surgery, with the diagnosis of OA RIGHT KNEE.  The various methods of treatment have been discussed with the patient and family. After consideration of risks, benefits and other options for treatment, the patient has consented to  Procedure(s): TOTAL KNEE ARTHROPLASTY (Right) as a surgical intervention.  The patient's history has been reviewed, patient examined, no change in status, stable for surgery.  I have reviewed the patient's chart and labs.  Questions were answered to the patient's satisfaction.     Yvette Rack

## 2022-03-15 NOTE — Anesthesia Postprocedure Evaluation (Signed)
Anesthesia Post Note  Patient: Scot Shiraishi  Procedure(s) Performed: TOTAL KNEE ARTHROPLASTY (Right: Knee)     Patient location during evaluation: PACU Anesthesia Type: Spinal Level of consciousness: oriented and awake and alert Pain management: pain level controlled Vital Signs Assessment: post-procedure vital signs reviewed and stable Respiratory status: spontaneous breathing, respiratory function stable and patient connected to nasal cannula oxygen Cardiovascular status: blood pressure returned to baseline and stable Postop Assessment: no headache, no backache and no apparent nausea or vomiting Anesthetic complications: no   No notable events documented.  Last Vitals:  Vitals:   03/15/22 1300 03/15/22 1400  BP: (!) 168/84 (!) 150/99  Pulse: (!) 54 (!) 52  Resp: 14 13  Temp:    SpO2: 99% 98%    Last Pain:  Vitals:   03/15/22 1400  TempSrc:   PainSc: 3                  Effie Berkshire

## 2022-03-15 NOTE — Progress Notes (Signed)
Orthopedic Tech Progress Note Patient Details:  [name] [name] Sep 28, 1950 [id number] Patient tolerated 0-90 well  CPM Right Knee CPM Right Knee: On Right Knee Flexion (Degrees): 90 Right Knee Extension (Degrees): 0  Post Interventions Patient Tolerated: Well Instructions Provided: Adjustment of device  Abdulrahim Siddiqi E Cyril Woodmansee 03/15/2022, 7:31 PM

## 2022-03-15 NOTE — Evaluation (Signed)
Physical Therapy Evaluation Patient Details Name: [name] [name] MRN: [id number] DOB: 25-Dec-1950 Today's Date: 03/15/2022  History of Present Illness  Pt is a 71yo male presenting s/p R-TKA on 03/15/22. PMH: BPH, HLD, HTN DM, hx of prostate cancer, L ankle fusion 1991, gout   Clinical Impression  [name] [name] is a 71 y.o. male POD 0 s/p R-TKA, evaluated in the PACU for possible same-day discharge. Patient reports independence with mobility at baseline. Patient is now limited by functional impairments (see PT problem list below) and requires min assist for bed mobility and for transfers, pt became nauseated and dizzy, VSS, further mobility deferred. Pt's pain primarily in his abdomen "I think it's my bladder," unable to void voluntarily in [facility]. Patient instructed in exercise to facilitate ROM and circulation to manage edema. Patient will benefit from continued skilled PT interventions to address impairments and progress towards PLOF. Acute PT will follow to progress mobility and stair training in preparation for safe discharge home.       Recommendations for follow up therapy are one component of a multi-disciplinary discharge planning process, led by the attending physician.  Recommendations may be updated based on patient status, additional functional criteria and insurance authorization.  Follow Up Recommendations Follow physician's recommendations for discharge plan and follow up therapies      Assistance Recommended at Discharge Frequent or constant Supervision/Assistance  Patient can return home with the following  A little help with walking and/or transfers;A little help with bathing/dressing/bathroom;Assistance with cooking/housework;Assist for transportation;Help with stairs or ramp for entrance    Equipment Recommendations None recommended by PT  Recommendations for Other Services       Functional Status Assessment Patient has had a recent decline in their functional status and  demonstrates the ability to make significant improvements in function in a reasonable and predictable amount of time.     Precautions / Restrictions Precautions Precautions: Fall Restrictions Weight Bearing Restrictions: Yes RLE Weight Bearing: Weight bearing as tolerated      Mobility  Bed Mobility Overal bed mobility: Needs Assistance Bed Mobility: Supine to Sit, Sit to Supine     Supine to sit: Min guard Sit to supine: Min assist   General bed mobility comments: Supine to sit: min guard for safety, no physical assist required. Supine to sit: min assist to bring BLE back up to stretcher.    Transfers Overall transfer level: Needs assistance Equipment used: Rolling walker (2 wheels) Transfers: Sit to/from Stand, Bed to chair/wheelchair/BSC Sit to Stand: Min guard, From elevated surface   Step pivot transfers: Min assist, From elevated surface       General transfer comment: Sit to stand: min guard for safety, VCs for sequencing. Step pivot: min assist for steadying of RW, VCs for sequencing. Pt reporting dizziness and nausea, further mobility deferred. Pt unable to voluntarily void.    Ambulation/Gait               General Gait Details: Deferred  Stairs            Wheelchair Mobility    Modified Rankin (Stroke Patients Only)       Balance Overall balance assessment: Needs assistance Sitting-balance support: Feet supported, No upper extremity supported Sitting balance-Leahy Scale: Good     Standing balance support: During functional activity, Bilateral upper extremity supported, Reliant on assistive device for balance Standing balance-Leahy Scale: Poor  Pertinent Vitals/Pain Pain Assessment Pain Assessment: 0-10 Pain Score: 10-Worst pain ever Pain Location: Stomach "my bladder," 5/10 pain in R knee Pain Descriptors / Indicators: Discomfort, Operative site guarding Pain Intervention(s): Limited  activity within patient's tolerance, Monitored during session, Repositioned, Ice applied (ice applied to knee)    Home Living Family/patient expects to be discharged to:: Private residence Living Arrangements: Spouse/significant other Available Help at Discharge: Family;Available 24 hours/day Type of Home: House Home Access: Stairs to enter Entrance Stairs-Rails: Right Entrance Stairs-Number of Steps: 7 Alternate Level Stairs-Number of Steps: 8 Home Layout: Two level;Bed/bath upstairs Home Equipment: Conservation officer, nature (2 wheels) Additional Comments: 157/85    Prior Function Prior Level of Function : Independent/Modified Independent;Driving;History of Falls (last six months)             Mobility Comments: IND ADLs Comments: IND     Hand Dominance        Extremity/Trunk Assessment   Upper Extremity Assessment Upper Extremity Assessment: Overall WFL for tasks assessed    Lower Extremity Assessment Lower Extremity Assessment: RLE deficits/detail;LLE deficits/detail RLE Deficits / Details: MMT ank DF/PF 5/5 RLE Sensation: WNL LLE Deficits / Details: MMT ank DF/PF 5/5, PT with hx of ankle fusion LLE Sensation: WNL    Cervical / Trunk Assessment Cervical / Trunk Assessment: Normal  Communication   Communication: No difficulties  Cognition Arousal/Alertness: Awake/alert Behavior During Therapy: WFL for tasks assessed/performed Overall Cognitive Status: Within Functional Limits for tasks assessed                                          General Comments      Exercises Total Joint Exercises Ankle Circles/Pumps: AROM, Both, 20 reps   Assessment/Plan    PT Assessment Patient needs continued PT services  PT Problem List Decreased strength;Decreased range of motion;Decreased activity tolerance;Decreased balance;Decreased mobility;Decreased coordination;Pain;Decreased knowledge of use of DME       PT Treatment Interventions DME instruction;Gait  training;Stair training;Functional mobility training;Therapeutic activities;Therapeutic exercise;Balance training;Neuromuscular re-education;Patient/family education    PT Goals (Current goals can be found in the Care Plan section)  Acute Rehab PT Goals Patient Stated Goal: "walking better" PT Goal Formulation: With patient Time For Goal Achievement: 03/22/22 Potential to Achieve Goals: Good    Frequency 7X/week     Co-evaluation               AM-PAC PT "6 Clicks" Mobility  Outcome Measure Help needed turning from your back to your side while in a flat bed without using bedrails?: None Help needed moving from lying on your back to sitting on the side of a flat bed without using bedrails?: A Little Help needed moving to and from a bed to a chair (including a wheelchair)?: A Little Help needed standing up from a chair using your arms (e.g., wheelchair or bedside chair)?: A Little Help needed to walk in hospital room?: A Little Help needed climbing 3-5 steps with a railing? : A Little 6 Click Score: 19    End of Session Equipment Utilized During Treatment: Gait belt Activity Tolerance: Patient limited by pain Patient left: in bed;with call bell/phone within reach;with nursing/sitter in room Nurse Communication: Mobility status PT Visit Diagnosis: Pain;Difficulty in walking, not elsewhere classified (R26.2) Pain - Right/Left: Right Pain - part of body: Knee (Also bladder/abdomen pain)    Time: 3825-0539 PT Time Calculation (min) (ACUTE ONLY): 33 min  Charges:   PT Evaluation $PT Eval Low Complexity: 1 Low PT Treatments $Therapeutic Activity: 8-22 mins        Coolidge Breeze, PT, DPT [facility] Department Office: [phone] Weekend pager: [phone]  Coolidge Breeze 03/15/2022, 5:02 PM

## 2022-03-15 NOTE — Anesthesia Procedure Notes (Signed)
Anesthesia Regional Block: Adductor canal block   Pre-Anesthetic Checklist: , timeout performed,  Correct Patient, Correct Site, Correct Laterality,  Correct Procedure, Correct Position, site marked,  Risks and benefits discussed,  Surgical consent,  Pre-op evaluation,  At surgeon's request and post-op pain management  Laterality: Right  Prep: chloraprep       Needles:  Injection technique: Single-shot  Needle Type: Echogenic Stimulator Needle     Needle Length: 9cm  Needle Gauge: 21     Additional Needles:   Procedures:,,,, ultrasound used (permanent image in chart),,    Narrative:  Start time: 03/15/2022 7:10 AM End time: 03/15/2022 7:15 AM Injection made incrementally with aspirations every 5 mL.  Performed by: Personally  Anesthesiologist: Effie Berkshire, MD  Additional Notes: Patient tolerated the procedure well. Local anesthetic introduced in an incremental fashion under minimal resistance after negative aspirations. No paresthesias were elicited. After completion of the procedure, no acute issues were identified and patient continued to be monitored by RN.

## 2022-03-15 NOTE — Brief Op Note (Signed)
03/15/2022  10:10 AM  PATIENT:  [name] [name]  71 y.o. male  PRE-OPERATIVE DIAGNOSIS:  OA RIGHT KNEE  POST-OPERATIVE DIAGNOSIS:  OA RIGHT KNEE  PROCEDURE:  Procedure(s): TOTAL KNEE ARTHROPLASTY (Right)  SURGEON:  Surgeon(s) and Role:    Earlie Server, MD - Primary  PHYSICIAN ASSISTANT: Chriss Czar, PA-C  ASSISTANTS: OR staff x1   ANESTHESIA:   local, regional, spinal, and IV sedation  EBL:  50 mL   BLOOD ADMINISTERED:none  DRAINS: none   LOCAL MEDICATIONS USED:  MARCAINE     SPECIMEN:  No Specimen  DISPOSITION OF SPECIMEN:  N/A  COUNTS:  YES  TOURNIQUET:   Total Tourniquet Time Documented: Thigh (Right) - 63 minutes Total: Thigh (Right) - 63 minutes   DICTATION: .Other Dictation: Dictation Number Unknown  PLAN OF CARE: Discharge to home after PACU  PATIENT DISPOSITION:  PACU - hemodynamically stable.   Delay start of Pharmacological VTE agent (>24hrs) due to surgical blood loss or risk of bleeding: yes

## 2022-03-15 NOTE — Discharge Instructions (Signed)

## 2022-03-16 DIAGNOSIS — I10 Essential (primary) hypertension: Secondary | ICD-10-CM | POA: Diagnosis not present

## 2022-03-16 DIAGNOSIS — M1711 Unilateral primary osteoarthritis, right knee: Secondary | ICD-10-CM | POA: Diagnosis not present

## 2022-03-16 DIAGNOSIS — Z79899 Other long term (current) drug therapy: Secondary | ICD-10-CM | POA: Diagnosis not present

## 2022-03-16 DIAGNOSIS — Z96651 Presence of right artificial knee joint: Secondary | ICD-10-CM | POA: Diagnosis not present

## 2022-03-16 MED ORDER — SULFAMETHOXAZOLE-TRIMETHOPRIM 800-160 MG PO TABS: 1.0000 | ORAL_TABLET | Freq: Two times a day (BID) | ORAL | 0 refills | Status: DC

## 2022-03-16 NOTE — Progress Notes (Signed)
Physical Therapy Treatment Patient Details Name: [name] [name] MRN: [id number] DOB: 1951-05-24 Today's Date: 03/16/2022   History of Present Illness Pt is a 71yo male presenting s/p R-TKA on 03/15/22. PMH: BPH, HLD, HTN DM, hx of prostate cancer, L ankle fusion 1991, gout    PT Comments    Pt practiced safe stair technique with spouse and ambulated in hallway.  HEP handout provided this morning, and pt aware to take home gait belt.  Pt to have grandson assist with stairs as well so recommended he hold gait belt in case pt needs steady assist however pt was able to perform without physical assist with spouse during session.   Pt feels ready for d/c home today.     Recommendations for follow up therapy are one component of a multi-disciplinary discharge planning process, led by the attending physician.  Recommendations may be updated based on patient status, additional functional criteria and insurance authorization.  Follow Up Recommendations  Follow physician's recommendations for discharge plan and follow up therapies     Assistance Recommended at Discharge Frequent or constant Supervision/Assistance  Patient can return home with the following A little help with walking and/or transfers;A little help with bathing/dressing/bathroom;Assist for transportation;Help with stairs or ramp for entrance;Assistance with cooking/housework   Equipment Recommendations  None recommended by PT    Recommendations for Other Services       Precautions / Restrictions Precautions Precautions: Fall;Knee Precaution Comments: educated pt on KI and use Required Braces or Orthoses: Knee Immobilizer - Right Restrictions RLE Weight Bearing: Weight bearing as tolerated     Mobility  Bed Mobility Overal bed mobility: Needs Assistance Bed Mobility: Supine to Sit     Supine to sit: Supervision, HOB elevated     General bed mobility comments: pt in recliner    Transfers Overall transfer level: Needs  assistance Equipment used: Rolling walker (2 wheels) Transfers: Sit to/from Stand Sit to Stand: Min guard           General transfer comment: verbal cues for UE and LE positioning    Ambulation/Gait Ambulation/Gait assistance: Min guard Gait Distance (Feet): 80 Feet Assistive device: Rolling walker (2 wheels) Gait Pattern/deviations: Step-to pattern, Decreased stance time - right, Antalgic Gait velocity: decr     General Gait Details: verbal cues for sequence, RW positioning, posture, mobilized with KI in place   Stairs Stairs: Yes Stairs assistance: Min guard Stair Management: Step to pattern, Backwards, With walker Number of Stairs: 2 General stair comments: verbal cues for sequence, RW positioning and safety; spouse arrived during stairs so held RW for pt's second time; pt and spouse report understanding   Wheelchair Mobility    Modified Rankin (Stroke Patients Only)       Balance                                            Cognition Arousal/Alertness: Awake/alert Behavior During Therapy: WFL for tasks assessed/performed Overall Cognitive Status: Within Functional Limits for tasks assessed                                          Exercises     General Comments        Pertinent Vitals/Pain Pain Assessment Pain Assessment: 0-10 Pain Score: 5  Pain Location:  right knee Pain Descriptors / Indicators: Aching, Sore Pain Intervention(s): Repositioned, Monitored during session    Home Living                          Prior Function            PT Goals (current goals can now be found in the care plan section) Progress towards PT goals: Progressing toward goals    Frequency    7X/week      PT Plan Current plan remains appropriate    Co-evaluation              AM-PAC PT "6 Clicks" Mobility   Outcome Measure  Help needed turning from your back to your side while in a flat bed without using  bedrails?: None Help needed moving from lying on your back to sitting on the side of a flat bed without using bedrails?: A Little Help needed moving to and from a bed to a chair (including a wheelchair)?: A Little Help needed standing up from a chair using your arms (e.g., wheelchair or bedside chair)?: A Little Help needed to walk in hospital room?: A Little Help needed climbing 3-5 steps with a railing? : A Little 6 Click Score: 19    End of Session Equipment Utilized During Treatment: Gait belt Activity Tolerance: Patient tolerated treatment well Patient left: in chair;with call bell/phone within reach;with family/visitor present Nurse Communication: Mobility status PT Visit Diagnosis: Difficulty in walking, not elsewhere classified (R26.2)     Time: 1350-1405 PT Time Calculation (min) (ACUTE ONLY): 15 min  Charges:  $Gait Training: 8-22 mins                     {Kati PT, DPT Physical Therapist Acute Rehabilitation Services Preferred contact method: Secure Chat Weekend Pager Only: [phone] Office: [phone]    Myrtis Hopping [place] 03/16/2022, 4:11 PM

## 2022-03-16 NOTE — Plan of Care (Signed)
Patient  ready for discharge to home.  Vital signs stable, no signs of acute distress at this time.

## 2022-03-16 NOTE — Discharge Summary (Signed)
PATIENT ID: [name] [name]        MRN:  [id number]          DOB/AGE: 12-15-1950 / 71 y.o.    DISCHARGE SUMMARY  ADMISSION DATE:    03/15/2022 DISCHARGE DATE:   03/16/2022   ADMISSION DIAGNOSIS: S/P total knee arthroplasty, right [Z96.651]    DISCHARGE DIAGNOSIS:  OA RIGHT KNEE    ADDITIONAL DIAGNOSIS: Principal Problem:   S/P total knee arthroplasty, right  Past Medical History:  Diagnosis Date   Allergy    seasonal allergies   Arthritis    generalized   Blood transfusion without reported diagnosis 2013   when had diverticulitis flare   BPH (benign prostatic hypertrophy)    Colon polyp    diverticular bleed with transfusion   Diverticulosis    Hyperlipidemia    on meds   Hypertension    on meds   Kidney stones    hx of 15-20 kidney stones    Obesity, unspecified    Pre-diabetes    Prostate cancer ([place]) 2020   dx 05/2019   Prostate nodule 07/27/2008    PROCEDURE: Procedure(s): TOTAL KNEE ARTHROPLASTY Right on 03/15/2022  CONSULTS: PT    HISTORY:  See H&P in chart  HOSPITAL COURSE:  [name] [name] is a 71 y.o. admitted on 03/15/2022 and found to have a diagnosis of OA RIGHT KNEE.  After appropriate laboratory studies were obtained  they were taken to the operating room on 03/15/2022 and underwent  Procedure(s): TOTAL KNEE ARTHROPLASTY  Right.   They were given perioperative antibiotics:  Anti-infectives (From admission, onward)    Start     Dose/Rate Route Frequency Ordered Stop   03/16/22 0000  sulfamethoxazole-trimethoprim (BACTRIM DS) 800-160 MG tablet        1 tablet Oral 2 times daily 03/16/22 1105     03/15/22 1400  ceFAZolin (ANCEF) IVPB 2g/100 mL premix        2 g 200 mL/hr over 30 Minutes Intravenous Every 6 hours 03/15/22 1024 03/15/22 2054   03/15/22 1309  ceFAZolin (ANCEF) 2-4 GM/100ML-% IVPB       Note to Pharmacy: Neta Ehlers C: cabinet override      03/15/22 1309 03/16/22 0114   03/15/22 0600  ceFAZolin (ANCEF) IVPB 3g/100 mL premix         3 g 200 mL/hr over 30 Minutes Intravenous On call to O.R. 03/15/22 0532 03/15/22 3614     .  Tolerated the procedure well.  Placed with a foley intraoperatively, unable to void on his own in PACU foley placed for overnight.      POD #1, allowed out of bed to a chair.  PT for ambulation and exercise program.  Foley D/C'd in morning.  Voiding on his own. IV saline locked.  O2 discontionued. Urine cx showing gm neg rods.   The remainder of the hospital course was dedicated to ambulation and strengthening.   The patient was discharged on 1 Day Post-Op in  Stable condition.  Blood products given:none  DIAGNOSTIC STUDIES: Recent vital signs: Patient Vitals for the past 24 hrs:  BP Temp Temp src Pulse Resp SpO2  03/16/22 1003 127/83 97.7 F (36.5 C) Oral (!) 55 18 100 %  03/16/22 0513 122/77 97.8 F (36.6 C) -- (!) 55 18 100 %  03/16/22 0118 128/69 97.8 F (36.6 C) -- 60 18 100 %  03/15/22 2213 (!) 153/68 98.1 F (36.7 C) -- (!) 58 18 98 %  03/15/22 2003 (!)  146/75 97.9 F (36.6 C) Oral 79 18 100 %  03/15/22 1804 (!) 151/80 98 F (36.7 C) Oral 62 16 99 %  03/15/22 1700 (!) 157/84 -- -- (!) 56 14 99 %  03/15/22 1600 (!) 155/84 -- -- (!) 55 13 98 %  03/15/22 1500 (!) 157/84 -- -- (!) 53 14 100 %  03/15/22 1400 (!) 150/99 -- -- (!) 52 13 98 %  03/15/22 1300 (!) 168/84 -- -- (!) 54 14 99 %       Recent laboratory studies: No results for input(s): "WBC", "HGB", "HCT", "PLT" in the last 168 hours. No results for input(s): "NA", "K", "CL", "CO2", "BUN", "CREATININE", "GLUCOSE", "CALCIUM" in the last 168 hours. Lab Results  Component Value Date   INR 0.96 05/08/2014     Recent Radiographic Studies :  No results found.  DISCHARGE INSTRUCTIONS:   DISCHARGE MEDICATIONS:   Allergies as of 03/16/2022       Reactions   Aspirin    REACTION: upsets stomach   Statins    REACTION: UPSETS STOMACH   Gadolinium Derivatives Nausea And Vomiting   Pt was given 20 ml multihance and  became nauseated and vomited several times.         Medication List     STOP taking these medications    cephALEXin 250 MG capsule Commonly known as: KEFLEX   ELDERBERRY PO       TAKE these medications    acetaminophen 500 MG tablet Commonly known as: TYLENOL Take 2 tablets (1,000 mg total) by mouth every 6 (six) hours as needed.   ALPRAZolam 0.5 MG tablet Commonly known as: XANAX TAKE 1 TABLET BY MOUTH EVERY DAY AS NEEDED FOR ANXIETY What changed: See the new instructions.   aspirin EC 81 MG tablet Take 1 tablet (81 mg total) by mouth 2 (two) times daily. TO PREVENT BLOOD CLOTS   lisinopril-hydrochlorothiazide 20-12.5 MG tablet Commonly known as: ZESTORETIC TAKE 1 TABLET BY MOUTH EVERY DAY   oxyCODONE 5 MG immediate release tablet Commonly known as: Oxy IR/ROXICODONE Take one tab po q4-6hrs prn pain   rosuvastatin 10 MG tablet Commonly known as: CRESTOR TAKE 1 TABLET BY MOUTH EVERY DAY What changed: how much to take   sulfamethoxazole-trimethoprim 800-160 MG tablet Commonly known as: BACTRIM DS Take 1 tablet by mouth 2 (two) times daily.   tiZANidine 4 MG tablet Commonly known as: Zanaflex Take 1 tablet (4 mg total) by mouth 3 (three) times daily.               Durable Medical Equipment  (From admission, onward)           Start     Ordered   03/15/22 1804  DME Walker rolling  Once       Question Answer Comment  Walker: With [place] Wheels   Patient needs a walker to treat with the following condition Primary localized osteoarthritis of right knee      03/15/22 1803            FOLLOW UP VISIT:    Follow-up Information     Earlie Server, MD. Go on 03/28/2022.   Specialty: Orthopedic Surgery Why: Your appointment is scheduled fo r11:45. Contact information: [place] [address] [phone]         [facility], [place]. Go on 03/18/2022.   Why: your outpatient physical  therapy appointment is scheduled for 7:15. Please arrive at 7:00. Contact information:  [facility] [place] [address] [phone]                 DISPOSITION:   Home  CONDITION:  Stable   Chriss Czar, PA-C  03/16/2022 12:48 PM

## 2022-03-16 NOTE — Progress Notes (Signed)
Patient  ready for discharge to home. Discharge instruction given  to patient and pts wife and verbalized understanding. Rt knee incision with ace wrap  clean,dry and intact Vital signs stable. No c/o of any acute distress at this time. All personal  belongings  taken with patient.

## 2022-03-16 NOTE — Progress Notes (Signed)
Physical Therapy Treatment Patient Details Name: [name] [name] MRN: [id number] DOB: Sep 20, 1950 Today's Date: 03/16/2022   History of Present Illness Pt is a 71yo male presenting s/p R-TKA on 03/15/22. PMH: BPH, HLD, HTN DM, hx of prostate cancer, L ankle fusion 1991, gout    PT Comments    Pt ambulated in hallway and performed LE exercises.  Pt slow to mobilize however reports pain better after receiving meds prior to session.  Anticipate pt to d/c home after second session and practicing stairs.    Recommendations for follow up therapy are one component of a multi-disciplinary discharge planning process, led by the attending physician.  Recommendations may be updated based on patient status, additional functional criteria and insurance authorization.  Follow Up Recommendations  Follow physician's recommendations for discharge plan and follow up therapies     Assistance Recommended at Discharge Frequent or constant Supervision/Assistance  Patient can return home with the following A little help with walking and/or transfers;A little help with bathing/dressing/bathroom;Assist for transportation;Help with stairs or ramp for entrance;Assistance with cooking/housework   Equipment Recommendations  None recommended by PT    Recommendations for Other Services       Precautions / Restrictions Precautions Precautions: Fall;Knee Precaution Comments: educated pt on KI and use Required Braces or Orthoses: Knee Immobilizer - Right Restrictions RLE Weight Bearing: Weight bearing as tolerated     Mobility  Bed Mobility Overal bed mobility: Needs Assistance Bed Mobility: Supine to Sit     Supine to sit: Supervision, HOB elevated          Transfers Overall transfer level: Needs assistance Equipment used: Rolling walker (2 wheels) Transfers: Sit to/from Stand Sit to Stand: Min assist           General transfer comment: verbal cues for UE and LE positioning, light assist to  rise and then control descent    Ambulation/Gait Ambulation/Gait assistance: Min guard Gait Distance (Feet): 80 Feet Assistive device: Rolling walker (2 wheels) Gait Pattern/deviations: Step-to pattern, Decreased stance time - right, Antalgic Gait velocity: decr     General Gait Details: verbal cues for sequence, RW positioning, posture, mobilized with KI in place   Stairs             Wheelchair Mobility    Modified Rankin (Stroke Patients Only)       Balance                                            Cognition Arousal/Alertness: Awake/alert Behavior During Therapy: WFL for tasks assessed/performed Overall Cognitive Status: Within Functional Limits for tasks assessed                                          Exercises Total Joint Exercises Ankle Circles/Pumps: AROM, 10 reps, Both Quad Sets: AROM, Right, 10 reps Heel Slides: AAROM, Right, 10 reps Hip ABduction/ADduction: AAROM, Right, 10 reps Straight Leg Raises: AAROM, Right, 10 reps    General Comments        Pertinent Vitals/Pain Pain Assessment Pain Assessment: 0-10 Pain Score: 3  Pain Location: right knee Pain Descriptors / Indicators: Aching, Sore Pain Intervention(s): Repositioned, Monitored during session, Premedicated before session    Home Living  Prior Function            PT Goals (current goals can now be found in the care plan section) Progress towards PT goals: Progressing toward goals    Frequency    7X/week      PT Plan Current plan remains appropriate    Co-evaluation              AM-PAC PT "6 Clicks" Mobility   Outcome Measure  Help needed turning from your back to your side while in a flat bed without using bedrails?: None Help needed moving from lying on your back to sitting on the side of a flat bed without using bedrails?: A Little Help needed moving to and from a bed to a chair  (including a wheelchair)?: A Little Help needed standing up from a chair using your arms (e.g., wheelchair or bedside chair)?: A Little Help needed to walk in hospital room?: A Little Help needed climbing 3-5 steps with a railing? : A Little 6 Click Score: 19    End of Session Equipment Utilized During Treatment: Gait belt Activity Tolerance: Patient tolerated treatment well Patient left: in chair;with call bell/phone within reach;with chair alarm set Nurse Communication: Mobility status PT Visit Diagnosis: Difficulty in walking, not elsewhere classified (R26.2)     Time: 2957-4734 PT Time Calculation (min) (ACUTE ONLY): 21 min  Charges:  $Therapeutic Exercise: 8-22 mins                    Jannette Spanner PT, DPT Physical Therapist Acute Rehabilitation Services Preferred contact method: Secure Chat Weekend Pager Only: [phone] Office: [place] 03/16/2022, 4:06 PM

## 2022-03-16 NOTE — TOC Initial Note (Signed)
Transition of Care [facility]) - Initial/Assessment Note    Patient Details  Name: [name] [name] MRN: [id number] Date of Birth: [birth year]  Transition [facility] Care [facility]) CM/SW Contact:    Henrietta Dine, RN Phone Number: 03/16/2022, 9:49 AM  Clinical Narrative:  pt from home with spouse; plan to d/c to same;  he has a ride home and to appts; the pt says he has his RW and CPM machine; OPPT previously arranged by MD's office;  no TOC needs.             Expected Discharge Plan: Home/Self Care Barriers to Discharge: No Barriers Identified   Patient Goals and CMS Choice Patient states their goals for this hospitalization and ongoing recovery are:: home      Expected Discharge Plan and Services Expected Discharge Plan: Home/Self Care   Discharge Planning Services: CM Consult   Living arrangements for the past 2 months: Single Family Home Expected Discharge Date: 03/16/22               DME Arranged: Gilford Rile rolling, CPM                    Prior Living Arrangements/Services Living arrangements for the past 2 months: Single Family Home Lives with:: Spouse Patient language and need for interpreter reviewed:: Yes Do you feel safe going back to the place where you live?: Yes      Need for Family Participation in Patient Care: Yes (Comment) Care giver support system in place?: Yes (comment)   Criminal Activity/Legal Involvement Pertinent to Current Situation/Hospitalization: No - Comment as needed  Activities of Daily Living Home Assistive Devices/Equipment: Walker (specify type) ADL Screening (condition at time of admission) Patient's cognitive ability adequate to safely complete daily activities?: Yes Is the patient deaf or have difficulty hearing?: No Does the patient have difficulty seeing, even when wearing glasses/contacts?: No Does the patient have difficulty concentrating, remembering, or making decisions?: No Patient able to express need for assistance with ADLs?:  Yes Does the patient have difficulty dressing or bathing?: No Independently performs ADLs?: Yes (appropriate for developmental age) Does the patient have difficulty walking or climbing stairs?: Yes Weakness of Legs: Right Weakness of Arms/Hands: None  Permission Sought/Granted Permission sought to share information with : Case Manager Permission granted to share information with : Yes, Verbal Permission Granted  Share Information with NAME: Lenor Coffin, RN, CM           Emotional Assessment   Attitude/Demeanor/Rapport: Gracious Affect (typically observed): Accepting Orientation: : Oriented to Self, Oriented to Place, Oriented to  Time, Oriented to Situation Alcohol / Substance Use: Not Applicable Psych Involvement: No (comment)  Admission diagnosis:  S/P total knee arthroplasty, right [Z96.651] Patient Active Problem List   Diagnosis Date Noted   S/P total knee arthroplasty, right 03/15/2022   Body mass index (BMI) of 38.0-38.9 in adult 01/10/2020   Diverticulosis 01/10/2020   Prediabetes 01/10/2020   Dyslipidemia 01/10/2020   Malignant neoplasm of prostate ([place]) 06/08/2019   Prostate enlargement 08/29/2017   Gout 10/25/2014   Essential hypertension    Benign prostatic hyperplasia    Kidney stones    Prostate nodule    PCP:  Horald Pollen, MD Pharmacy:   [id number]: [phone]: [phone]    Social Determinants of Health (SDOH) Interventions    Readmission Risk Interventions     No data to  display

## 2022-03-17 LAB — URINE CULTURE: Culture: >=100000 — AB

## 2022-03-18 ENCOUNTER — Encounter (HOSPITAL_COMMUNITY): Payer: Self-pay | Admitting: Orthopedic Surgery

## 2022-03-18 DIAGNOSIS — M1711 Unilateral primary osteoarthritis, right knee: Secondary | ICD-10-CM | POA: Diagnosis not present

## 2022-03-18 NOTE — Progress Notes (Signed)
Call patient and inquire about UTI symptoms.  Urine culture done while at the hospital grew Pseudomonas.  Someone started him on antibiotics, Bactrim DS twice a day for 10 days.  Make sure he is aware of this finding and inquire about symptoms please.  Thanks.

## 2022-03-20 ENCOUNTER — Other Ambulatory Visit: Payer: Self-pay | Admitting: Emergency Medicine

## 2022-03-20 DIAGNOSIS — B965 Pseudomonas (aeruginosa) (mallei) (pseudomallei) as the cause of diseases classified elsewhere: Secondary | ICD-10-CM

## 2022-03-20 MED ORDER — CIPROFLOXACIN HCL 500 MG PO TABS: 500.0000 mg | ORAL_TABLET | Freq: Two times a day (BID) | ORAL | 0 refills | Status: AC

## 2022-03-20 NOTE — Progress Notes (Signed)
Should be switched to Cipro.  New prescription sent to pharmacy of record now.  Thanks.

## 2022-03-22 DIAGNOSIS — M1711 Unilateral primary osteoarthritis, right knee: Secondary | ICD-10-CM | POA: Diagnosis not present

## 2022-03-27 DIAGNOSIS — M1711 Unilateral primary osteoarthritis, right knee: Secondary | ICD-10-CM | POA: Diagnosis not present

## 2022-03-28 DIAGNOSIS — M1711 Unilateral primary osteoarthritis, right knee: Secondary | ICD-10-CM | POA: Diagnosis not present

## 2022-03-29 ENCOUNTER — Ambulatory Visit: Payer: Medicare Other

## 2022-03-29 DIAGNOSIS — M1711 Unilateral primary osteoarthritis, right knee: Secondary | ICD-10-CM | POA: Diagnosis not present

## 2022-04-01 DIAGNOSIS — M1711 Unilateral primary osteoarthritis, right knee: Secondary | ICD-10-CM | POA: Diagnosis not present

## 2022-04-03 DIAGNOSIS — M1711 Unilateral primary osteoarthritis, right knee: Secondary | ICD-10-CM | POA: Diagnosis not present

## 2022-04-05 ENCOUNTER — Ambulatory Visit: Payer: Medicare Other

## 2022-04-09 DIAGNOSIS — M1711 Unilateral primary osteoarthritis, right knee: Secondary | ICD-10-CM | POA: Diagnosis not present

## 2022-04-11 ENCOUNTER — Ambulatory Visit (INDEPENDENT_AMBULATORY_CARE_PROVIDER_SITE_OTHER): Payer: Medicare Other

## 2022-04-11 DIAGNOSIS — Z0000 Encounter for general adult medical examination without abnormal findings: Secondary | ICD-10-CM | POA: Diagnosis not present

## 2022-04-11 DIAGNOSIS — M1711 Unilateral primary osteoarthritis, right knee: Secondary | ICD-10-CM | POA: Diagnosis not present

## 2022-04-11 NOTE — Progress Notes (Signed)
Virtual Visit via Telephone Note  I connected with  [name] [name] on 04/11/22 at  2:30 PM EDT by telephone and verified that I am speaking with the correct person using two identifiers.  Location: Patient: Home Provider: [place] Persons participating in the virtual visit: [place]   I discussed the limitations, risks, security and privacy concerns of performing an evaluation and management service by telephone and the availability of in person appointments. The patient expressed understanding and agreed to proceed.  Interactive audio and video telecommunications were attempted between this nurse and patient, however failed, due to patient having technical difficulties OR patient did not have access to video capability.  We continued and completed visit with audio only.  Some vital signs may be absent or patient reported.   Sheral Flow, LPN  Subjective:   [name] [name] is a 71 y.o. male who presents for Medicare Annual/Subsequent preventive examination.  Review of Systems     Cardiac Risk Factors include: advanced age (>5mn, >>76women);dyslipidemia;hypertension;male gender;obesity (BMI >30kg/m2)     Objective:    There were no vitals filed for this visit. There is no height or weight on file to calculate BMI.     04/11/2022    2:34 PM 03/15/2022    6:00 PM 03/04/2022    2:58 PM 12/07/2020    9:00 AM 11/16/2019   10:27 AM 06/08/2019    7:40 AM  Advanced Directives  Does Patient Have a Medical Advance Directive? No No No No No No  Would patient like information on creating a medical advance directive? No - Patient declined No - Patient declined  Yes (MAU/Ambulatory/Procedural Areas - Information given)  Yes (MAU/Ambulatory/Procedural Areas - Information given)    Current Medications (verified) Outpatient Encounter Medications as of 04/11/2022  Medication Sig   acetaminophen (TYLENOL) 500 MG tablet Take 2 tablets (1,000 mg total) by  mouth every 6 (six) hours as needed.   ALPRAZolam (XANAX) 0.5 MG tablet TAKE 1 TABLET BY MOUTH EVERY DAY AS NEEDED FOR ANXIETY (Patient taking differently: Take 0.5 mg by mouth at bedtime as needed for sleep.)   aspirin EC 81 MG tablet Take 1 tablet (81 mg total) by mouth 2 (two) times daily. TO PREVENT BLOOD CLOTS   lisinopril-hydrochlorothiazide (ZESTORETIC) 20-12.5 MG tablet TAKE 1 TABLET BY MOUTH EVERY DAY (Patient taking differently: Take 1 tablet by mouth daily.)   oxyCODONE (OXY IR/ROXICODONE) 5 MG immediate release tablet Take one tab po q4-6hrs prn pain   rosuvastatin (CRESTOR) 10 MG tablet TAKE 1 TABLET BY MOUTH EVERY DAY (Patient taking differently: Take by mouth daily.)   sulfamethoxazole-trimethoprim (BACTRIM DS) 800-160 MG tablet Take 1 tablet by mouth 2 (two) times daily.   tiZANidine (ZANAFLEX) 4 MG tablet Take 1 tablet (4 mg total) by mouth 3 (three) times daily.   No facility-administered encounter medications on file as of 04/11/2022.    Allergies (verified) Aspirin, Statins, and Gadolinium derivatives   History: Past Medical History:  Diagnosis Date   Allergy    seasonal allergies   Arthritis    generalized   Blood transfusion without reported diagnosis 2013   when had diverticulitis flare   BPH (benign prostatic hypertrophy)    Colon polyp    diverticular bleed with transfusion   Diverticulosis    Hyperlipidemia    on meds   Hypertension    on meds   Kidney stones    hx of 15-20 kidney stones    Obesity, unspecified  Pre-diabetes    Prostate cancer ([facility]) 2020   dx 05/2019   Prostate nodule 07/27/2008   Past Surgical History:  Procedure Laterality Date   ANKLE FUSION Left 1991   COLONOSCOPY  2011   DB-F/V-miralax(good)-TICS/3 HPP   HERNIA REPAIR N/A    umbilical    POLYPECTOMY  2011   3 HPP   TOTAL KNEE ARTHROPLASTY Right 03/15/2022   Procedure: TOTAL KNEE ARTHROPLASTY;  Surgeon: Earlie Server, MD;  Location: [facility];  Service: Orthopedics;   Laterality: Right;   TRANSURETHRAL RESECTION OF PROSTATE  2020   Family History  Problem Relation Age of Onset   Diabetes Mother    Breast cancer Mother 31   Prostate cancer Father        patient did not have a relationship with his father but understands he had prostate ca   Colon polyps Father 56   Colon cancer Father 64   Diabetes Sister    Pancreatic cancer Neg Hx    Esophageal cancer Neg Hx    Stomach cancer Neg Hx    Rectal cancer Neg Hx    Social History   Socioeconomic History   Marital status: Married    Spouse name: Not on file   Number of children: 2   Years of education: Not on file   Highest education level: Not on file  Occupational History   Not on file  Tobacco Use   Smoking status: Never   Smokeless tobacco: Never  Vaping Use   Vaping Use: Never used  Substance and Sexual Activity   Alcohol use: No   Drug use: No   Sexual activity: Yes  Other Topics Concern   Not on file  Social History Narrative   Not on file   Social Determinants of Health   Financial Resource Strain: Low Risk  (04/11/2022)   Overall Financial Resource Strain (CARDIA)    Difficulty of Paying Living Expenses: Not hard at all  Food Insecurity: No Food Insecurity (04/11/2022)   Hunger Vital Sign    Worried About Running Out of Food in the Last Year: Never true    Ran Out of Food in the Last Year: Never true  Transportation Needs: No Transportation Needs (04/11/2022)   PRAPARE - Hydrologist (Medical): No    Lack of Transportation (Non-Medical): No  Physical Activity: Insufficiently Active (04/11/2022)   Exercise Vital Sign    Days of Exercise per Week: 3 days    Minutes of Exercise per Session: 40 min  Stress: No Stress Concern Present (04/11/2022)   [place]    Feeling of Stress : Not at all  Social Connections: [place] (04/11/2022)   Social Connection and Isolation Panel  [NHANES]    Frequency of Communication with Friends and Family: More than three times a week    Frequency of Social Gatherings with Friends and Family: More than three times a week    Attends Religious Services: More than 4 times per year    Active Member of [employer] or Organizations: Yes    Attends Music therapist: More than 4 times per year    Marital Status: Married    Tobacco Counseling Counseling given: Not Answered   Clinical Intake:  Pre-visit preparation completed: Yes  Pain : No/denies pain     BMI - recorded: 38 (02/25/2022) Nutritional Status: BMI > 30  Obese Nutritional Risks: None Diabetes: No  How often do  you need to have someone help you when you read instructions, pamphlets, or other written materials from your doctor or pharmacy?: 1 - Never What is the last grade level you completed in school?: HSG  Diabetic? no  Interpreter Needed?: No  Information entered by :: Lisette Abu, LPN.   Activities of Daily Living    04/11/2022    2:40 PM 03/15/2022    6:00 PM  In your present state of health, do you have any difficulty performing the following activities:  Hearing? 0 0  Vision? 0 0  Difficulty concentrating or making decisions? 0 0  Walking or climbing stairs? 1 1  Comment s/p right knee surgery; walking with a cane   Dressing or bathing? 0 0  Doing errands, shopping? 0 0  Preparing Food and eating ? N   Using the Toilet? N   In the past six months, have you accidently leaked urine? N   Do you have problems with loss of bowel control? N   Managing your Medications? N   Managing your Finances? N   Housekeeping or managing your Housekeeping? N     Patient Care Team: Horald Pollen, MD as PCP - General (Internal Medicine) Cira Rue, RN Nurse Navigator as Registered Nurse (Medical Oncology)  Indicate any recent [place] you may have received from other than [facility] providers in the past year (date may be  approximate).     Assessment:   This is a routine wellness examination for Rodriguez.  Hearing/Vision screen Hearing Screening - Comments:: Denies hearing difficulties   Vision Screening - Comments:: Wears rx glasses - up to date with routine eye exams with [facility]; no cataracts  Dietary issues and exercise activities discussed: Current Exercise Habits: Structured exercise class (Physical therapy), Type of exercise: Other - see comments (Physical therapy), Time (Minutes): 40, Frequency (Times/Week): 3, Weekly Exercise (Minutes/Week): 120, Exercise limited by: orthopedic condition(s)   Goals Addressed   None   Depression Screen    04/11/2022    2:37 PM 02/06/2022   10:36 AM 08/20/2021    9:00 AM 07/30/2021    1:48 PM 12/07/2020    9:00 AM 11/16/2020    3:44 PM 07/12/2020    8:33 AM  PHQ 2/9 Scores  PHQ - 2 Score 0 0 0 0 0 0 0  PHQ- 9 Score  0     1    Fall Risk    04/11/2022    2:35 PM 02/06/2022   10:36 AM 01/16/2022    2:10 PM 08/20/2021    9:00 AM 07/30/2021    1:47 PM  [place] in the past year? 0 0 0 0 0  Comment   continues to deny new/ recent falls x last 12 months; occasionally uses cane    Number falls in past yr: 0 0 0 0 0  Injury with Fall? 0 0 0 0 0  Comment    N/A, no falls reported; does not use assistive devices   Risk for fall due to : No Fall Risks No Fall Risks Orthopedic patient No Fall Risks   Follow up Falls prevention discussed Falls evaluation completed Falls prevention discussed Falls prevention discussed     FALL RISK PREVENTION PERTAINING TO THE HOME:  Any stairs in or around the home? Yes  If so, are there any without handrails? No  Home free of loose throw rugs in walkways, pet beds, electrical cords, etc? Yes  Adequate lighting in your home to  reduce risk of falls? Yes   ASSISTIVE DEVICES UTILIZED TO PREVENT FALLS:  Life alert? No  Use of a cane, walker or w/c? Yes  Grab bars in the bathroom? No  Shower chair or bench in shower? Yes   Elevated toilet seat or a handicapped toilet? Yes   TIMED UP AND GO:  Was the test performed? No . Phone Visit   Cognitive Function:        04/11/2022    2:43 PM  6CIT Screen  What Year? 0 points  What month? 0 points  What time? 0 points  Count back from 20 0 points  Months in reverse 0 points  Repeat phrase 0 points  Total Score 0 points    Immunizations Immunization History  Administered Date(s) Administered   Fluad Quad(high Dose 65+) 04/14/2020   Influenza Split 08/01/2010   Influenza, High Dose Seasonal PF 03/03/2018, 04/05/2019   Influenza,inj,Quad PF,6+ Mos 02/25/2015, 04/05/2016   Influenza-Unspecified 03/20/2019   PFIZER(Purple Top)SARS-COV-2 Vaccination 07/06/2019, 07/27/2019, 05/11/2020   Pneumococcal Conjugate-13 03/03/2018   Pneumococcal Polysaccharide-23 02/25/2007, 06/07/2019   Td 04/14/2020   Tdap 07/31/2009   Zoster Recombinat (Shingrix) 11/16/2020, 01/23/2021    TDAP status: Up to date  Flu Vaccine status: Due, Education has been provided regarding the importance of this vaccine. Advised may receive this vaccine at local pharmacy or Health Dept. Aware to provide a copy of the vaccination record if obtained from local pharmacy or Health Dept. Verbalized acceptance and understanding.  Pneumococcal vaccine status: Up to date  Covid-19 vaccine status: Completed vaccines  Qualifies for Shingles Vaccine? Yes   Zostavax completed No   Shingrix Completed?: Yes  Screening Tests Health Maintenance  Topic Date Due   COVID-19 Vaccine (4 - Pfizer risk series) 07/06/2020   INFLUENZA VACCINE  09/08/2022 (Originally 01/08/2022)   Medicare Annual Wellness (AWV)  04/12/2023   TETANUS/TDAP  04/14/2030   COLONOSCOPY (Pts 45-62yr Insurance coverage will need to be confirmed)  04/24/2030   Pneumonia Vaccine 69 Years old  Completed   Hepatitis C Screening  Completed   Zoster Vaccines- Shingrix  Completed   HPV VACCINES  Aged Out    Health  Maintenance  Health Maintenance Due  Topic Date Due   COVID-19 Vaccine (4 - Pfizer risk series) 07/06/2020    Colorectal cancer screening: Type of screening: Colonoscopy. Completed 04/24/2020. Repeat every 10 years  Lung Cancer Screening: (Low Dose CT Chest recommended if Age 69-80years, 30 pack-year currently smoking OR have quit w/in 15years.) does not qualify.   Lung Cancer Screening Referral: no  Additional Screening:  Hepatitis C Screening: does qualify; Completed 01/09/2017  Vision Screening: Recommended annual ophthalmology exams for early detection of glaucoma and other disorders of the eye. Is the patient up to date with their annual eye exam?  Yes  Who is the provider or what is the name of the office in which the patient attends annual eye exams? [facility] If pt is not established with a provider, would they like to be referred to a provider to establish care? No .   Dental Screening: Recommended annual dental exams for proper oral hygiene  Community Resource Referral / Chronic Care Management: CRR required this visit?  No   CCM required this visit?  No      Plan:     I have personally reviewed and noted the following in the patient's chart:   Medical and social history Use of alcohol, tobacco or illicit drugs  Current medications and supplements  including opioid prescriptions. Patient is currently taking opioid prescriptions. Information provided to patient regarding non-opioid alternatives. Patient advised to discuss non-opioid treatment plan with their provider. Functional ability and status Nutritional status Physical activity Advanced directives List of other physicians Hospitalizations, surgeries, and ER visits in previous 12 months Vitals Screenings to include cognitive, depression, and falls Referrals and appointments  In addition, I have reviewed and discussed with patient certain preventive protocols, quality metrics, and best practice  recommendations. A written personalized care plan for preventive services as well as general preventive health recommendations were provided to patient.     Sheral Flow, LPN   25/09/9824   Nurse Notes: N/A

## 2022-04-11 NOTE — Patient Instructions (Signed)
Mr. [name] [name] , Thank you for taking time to come for your Medicare Wellness Visit. I appreciate your ongoing commitment to your health goals. Please review the following plan we discussed and let me know if I can assist you in the future.   These are the goals we discussed:  Goals      Client understands the importance of follow-up with providers by attending scheduled visits        This is a list of the screening recommended for you and due dates:  Health Maintenance  Topic Date Due   COVID-19 Vaccine (4 - Pfizer risk series) 07/06/2020   Flu Shot  09/08/2022*   Medicare Annual Wellness Visit  04/12/2023   Tetanus Vaccine  04/14/2030   Colon Cancer Screening  04/24/2030   Pneumonia Vaccine  Completed   Hepatitis C Screening: USPSTF Recommendation to screen - Ages 18-79 yo.  Completed   Zoster (Shingles) Vaccine  Completed   HPV Vaccine  Aged Out  *Topic was postponed. The date shown is not the original due date.    Advanced directives: No; Advance directive discussed with you today. Even though you declined this today please call our office should you change your mind and we can give you the proper paperwork for you to fill out.  Conditions/risks identified: Yes  Next appointment: Follow up in one year for your annual wellness visit.   Preventive Care 69 Years and Older, Male  Preventive care refers to lifestyle choices and visits with your health care provider that can promote health and wellness. What does preventive care include? A yearly physical exam. This is also called an annual well check. Dental exams once or twice a year. Routine eye exams. Ask your health care provider how often you should have your eyes checked. Personal lifestyle choices, including: Daily care of your teeth and gums. Regular physical activity. Eating a healthy diet. Avoiding tobacco and drug use. Limiting alcohol use. Practicing safe sex. Taking low doses of aspirin every day. Taking vitamin  and mineral supplements as recommended by your health care provider. What happens during an annual well check? The services and screenings done by your health care provider during your annual well check will depend on your age, overall health, lifestyle risk factors, and family history of disease. Counseling  Your health care provider may ask you questions about your: Alcohol use. Tobacco use. Drug use. Emotional well-being. Home and relationship well-being. Sexual activity. Eating habits. History of falls. Memory and ability to understand (cognition). Work and work Statistician. Screening  You may have the following tests or measurements: Height, weight, and BMI. Blood pressure. Lipid and cholesterol levels. These may be checked every 5 years, or more frequently if you are over 17 years old. Skin check. Lung cancer screening. You may have this screening every year starting at age 69 if you have a 30-pack-year history of smoking and currently smoke or have quit within the past 15 years. Fecal occult blood test (FOBT) of the stool. You may have this test every year starting at age 1. Flexible sigmoidoscopy or colonoscopy. You may have a sigmoidoscopy every 5 years or a colonoscopy every 10 years starting at age 69. Prostate cancer screening. Recommendations will vary depending on your family history and other risks. Hepatitis C blood test. Hepatitis B blood test. Sexually transmitted disease (STD) testing. Diabetes screening. This is done by checking your blood sugar (glucose) after you have not eaten for a while (fasting). You may have this done  every 1-3 years. Abdominal aortic aneurysm (AAA) screening. You may need this if you are a current or former smoker. Osteoporosis. You may be screened starting at age 65 if you are at high risk. Talk with your health care provider about your test results, treatment options, and if necessary, the need for more tests. Vaccines  Your health care  provider may recommend certain vaccines, such as: Influenza vaccine. This is recommended every year. Tetanus, diphtheria, and acellular pertussis (Tdap, Td) vaccine. You may need a Td booster every 10 years. Zoster vaccine. You may need this after age 69. Pneumococcal 13-valent conjugate (PCV13) vaccine. One dose is recommended after age 69. Pneumococcal polysaccharide (PPSV23) vaccine. One dose is recommended after age 69. Talk to your health care provider about which screenings and vaccines you need and how often you need them. This information is not intended to replace advice given to you by your health care provider. Make sure you discuss any questions you have with your health care provider. Document Released: 06/23/2015 Document Revised: 02/14/2016 Document Reviewed: 03/28/2015 Elsevier Interactive Patient Education  2017 [place] Prevention in the Home Falls can cause injuries. They can happen to people of all ages. There are many things you can do to make your home safe and to help prevent falls. What can I do on the outside of my home? Regularly fix the edges of walkways and driveways and fix any cracks. Remove anything that might make you trip as you walk through a door, such as a raised step or threshold. Trim any bushes or trees on the path to your home. Use bright outdoor lighting. Clear any walking paths of anything that might make someone trip, such as rocks or tools. Regularly check to see if handrails are loose or broken. Make sure that both sides of any steps have handrails. Any raised decks and porches should have guardrails on the edges. Have any leaves, snow, or ice cleared regularly. Use sand or salt on walking paths during winter. Clean up any spills in your garage right away. This includes oil or grease spills. What can I do in the bathroom? Use night lights. Install grab bars by the toilet and in the tub and shower. Do not use towel bars as grab  bars. Use non-skid mats or decals in the tub or shower. If you need to sit down in the shower, use a plastic, non-slip stool. Keep the floor dry. Clean up any water that spills on the floor as soon as it happens. Remove soap buildup in the tub or shower regularly. Attach bath mats securely with double-sided non-slip rug tape. Do not have throw rugs and other things on the floor that can make you trip. What can I do in the bedroom? Use night lights. Make sure that you have a light by your bed that is easy to reach. Do not use any sheets or blankets that are too big for your bed. They should not hang down onto the floor. Have a firm chair that has side arms. You can use this for support while you get dressed. Do not have throw rugs and other things on the floor that can make you trip. What can I do in the kitchen? Clean up any spills right away. Avoid walking on wet floors. Keep items that you use a lot in easy-to-reach places. If you need to reach something above you, use a strong step stool that has a grab bar. Keep electrical cords out of the  way. Do not use floor polish or wax that makes floors slippery. If you must use wax, use non-skid floor wax. Do not have throw rugs and other things on the floor that can make you trip. What can I do with my stairs? Do not leave any items on the stairs. Make sure that there are handrails on both sides of the stairs and use them. Fix handrails that are broken or loose. Make sure that handrails are as long as the stairways. Check any carpeting to make sure that it is firmly attached to the stairs. Fix any carpet that is loose or worn. Avoid having throw rugs at the top or bottom of the stairs. If you do have throw rugs, attach them to the floor with carpet tape. Make sure that you have a light switch at the top of the stairs and the bottom of the stairs. If you do not have them, ask someone to add them for you. What else can I do to help prevent  falls? Wear shoes that: Do not have high heels. Have rubber bottoms. Are comfortable and fit you well. Are closed at the toe. Do not wear sandals. If you use a stepladder: Make sure that it is fully opened. Do not climb a closed stepladder. Make sure that both sides of the stepladder are locked into place. Ask someone to hold it for you, if possible. Clearly mark and make sure that you can see: Any grab bars or handrails. First and last steps. Where the edge of each step is. Use tools that help you move around (mobility aids) if they are needed. These include: Canes. Walkers. Scooters. Crutches. Turn on the lights when you go into a dark area. Replace any light bulbs as soon as they burn out. Set up your furniture so you have a clear path. Avoid moving your furniture around. If any of your floors are uneven, fix them. If there are any pets around you, be aware of where they are. Review your medicines with your doctor. Some medicines can make you feel dizzy. This can increase your chance of falling. Ask your doctor what other things that you can do to help prevent falls. This information is not intended to replace advice given to you by your health care provider. Make sure you discuss any questions you have with your health care provider. Document Released: 03/23/2009 Document Revised: 11/02/2015 Document Reviewed: 07/01/2014 Elsevier Interactive Patient Education  2017 [employer].

## 2022-04-16 DIAGNOSIS — M1711 Unilateral primary osteoarthritis, right knee: Secondary | ICD-10-CM | POA: Diagnosis not present

## 2022-04-18 DIAGNOSIS — M1711 Unilateral primary osteoarthritis, right knee: Secondary | ICD-10-CM | POA: Diagnosis not present

## 2022-04-23 DIAGNOSIS — M1711 Unilateral primary osteoarthritis, right knee: Secondary | ICD-10-CM | POA: Diagnosis not present

## 2022-04-25 DIAGNOSIS — M1711 Unilateral primary osteoarthritis, right knee: Secondary | ICD-10-CM | POA: Diagnosis not present

## 2022-04-26 DIAGNOSIS — M1711 Unilateral primary osteoarthritis, right knee: Secondary | ICD-10-CM | POA: Diagnosis not present

## 2022-04-30 DIAGNOSIS — M1711 Unilateral primary osteoarthritis, right knee: Secondary | ICD-10-CM | POA: Diagnosis not present

## 2022-05-07 DIAGNOSIS — M1711 Unilateral primary osteoarthritis, right knee: Secondary | ICD-10-CM | POA: Diagnosis not present

## 2022-05-09 DIAGNOSIS — M1711 Unilateral primary osteoarthritis, right knee: Secondary | ICD-10-CM | POA: Diagnosis not present

## 2022-05-09 DIAGNOSIS — M1712 Unilateral primary osteoarthritis, left knee: Secondary | ICD-10-CM | POA: Diagnosis not present

## 2022-05-14 DIAGNOSIS — M1711 Unilateral primary osteoarthritis, right knee: Secondary | ICD-10-CM | POA: Diagnosis not present

## 2022-05-16 DIAGNOSIS — M1711 Unilateral primary osteoarthritis, right knee: Secondary | ICD-10-CM | POA: Diagnosis not present

## 2022-05-16 DIAGNOSIS — M1712 Unilateral primary osteoarthritis, left knee: Secondary | ICD-10-CM | POA: Diagnosis not present

## 2022-05-21 DIAGNOSIS — M1711 Unilateral primary osteoarthritis, right knee: Secondary | ICD-10-CM | POA: Diagnosis not present

## 2022-05-23 DIAGNOSIS — M1711 Unilateral primary osteoarthritis, right knee: Secondary | ICD-10-CM | POA: Diagnosis not present

## 2022-05-23 DIAGNOSIS — M1712 Unilateral primary osteoarthritis, left knee: Secondary | ICD-10-CM | POA: Diagnosis not present

## 2022-05-23 DIAGNOSIS — M25572 Pain in left ankle and joints of left foot: Secondary | ICD-10-CM | POA: Diagnosis not present

## 2022-05-28 DIAGNOSIS — M1711 Unilateral primary osteoarthritis, right knee: Secondary | ICD-10-CM | POA: Diagnosis not present

## 2022-05-29 DIAGNOSIS — M1711 Unilateral primary osteoarthritis, right knee: Secondary | ICD-10-CM | POA: Diagnosis not present

## 2022-05-30 DIAGNOSIS — R350 Frequency of micturition: Secondary | ICD-10-CM | POA: Diagnosis not present

## 2022-05-30 DIAGNOSIS — N3020 Other chronic cystitis without hematuria: Secondary | ICD-10-CM | POA: Diagnosis not present

## 2022-05-30 DIAGNOSIS — R3121 Asymptomatic microscopic hematuria: Secondary | ICD-10-CM | POA: Diagnosis not present

## 2022-06-04 DIAGNOSIS — M1711 Unilateral primary osteoarthritis, right knee: Secondary | ICD-10-CM | POA: Diagnosis not present

## 2022-06-06 DIAGNOSIS — M1711 Unilateral primary osteoarthritis, right knee: Secondary | ICD-10-CM | POA: Diagnosis not present

## 2022-06-11 DIAGNOSIS — M1711 Unilateral primary osteoarthritis, right knee: Secondary | ICD-10-CM | POA: Diagnosis not present

## 2022-06-13 DIAGNOSIS — M1711 Unilateral primary osteoarthritis, right knee: Secondary | ICD-10-CM | POA: Diagnosis not present

## 2022-06-13 IMAGING — DX DG WRIST COMPLETE 3+V*R*
4 series · 4 of 4 positions shown · non-contrast
Comparison: None.

CLINICAL DATA: Right wrist pain and swelling for 1 month.

EXAM:
RIGHT WRIST - COMPLETE 3+ VIEW

[wrist ap]
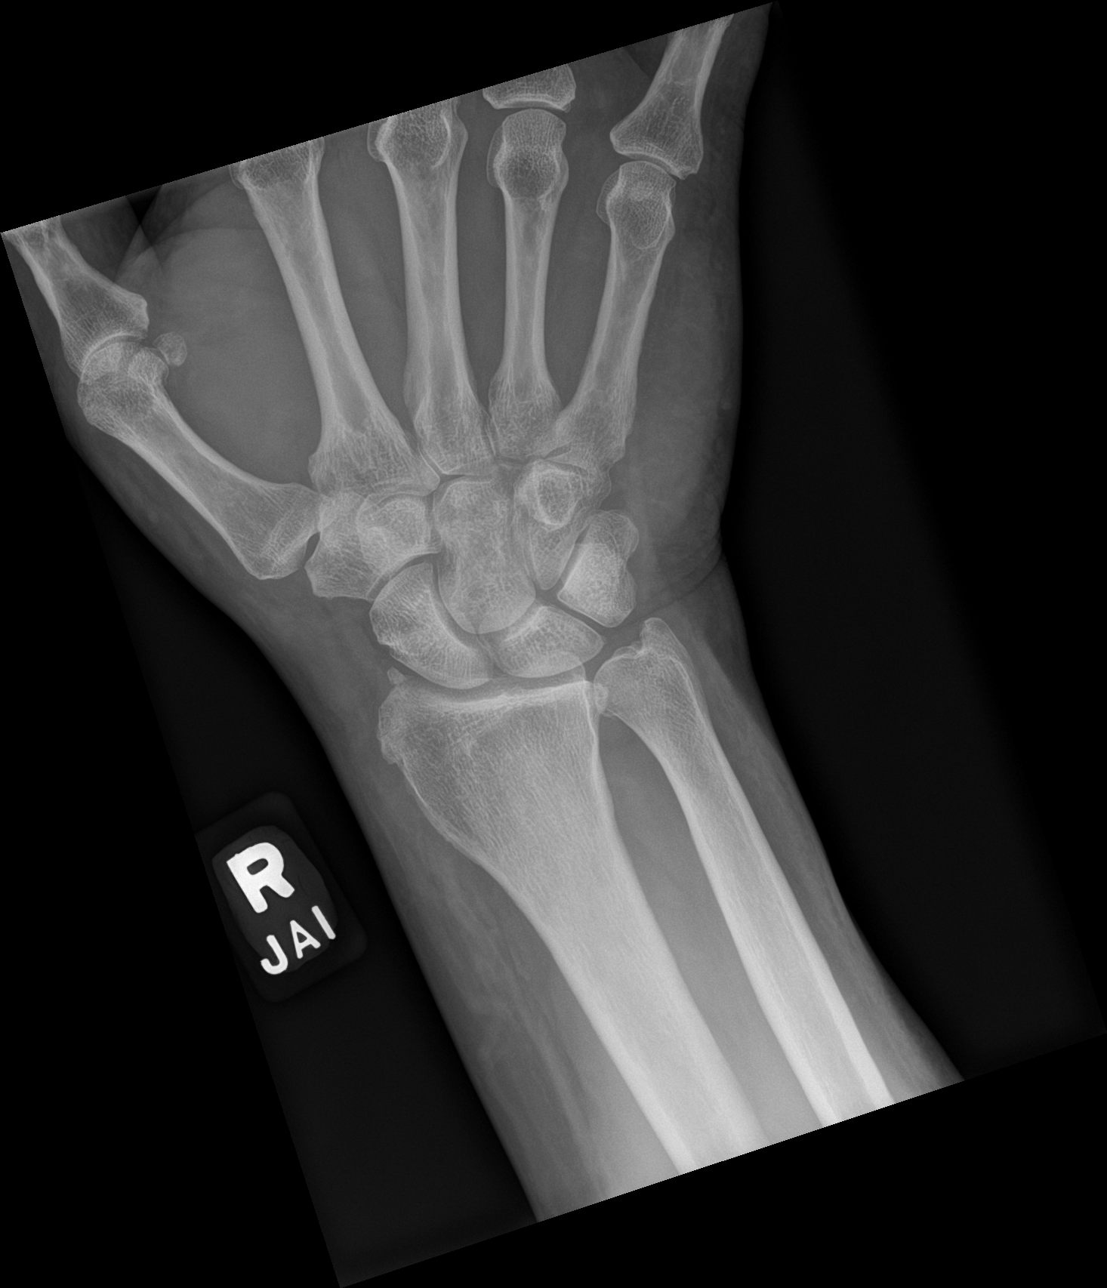

[wrist obl]
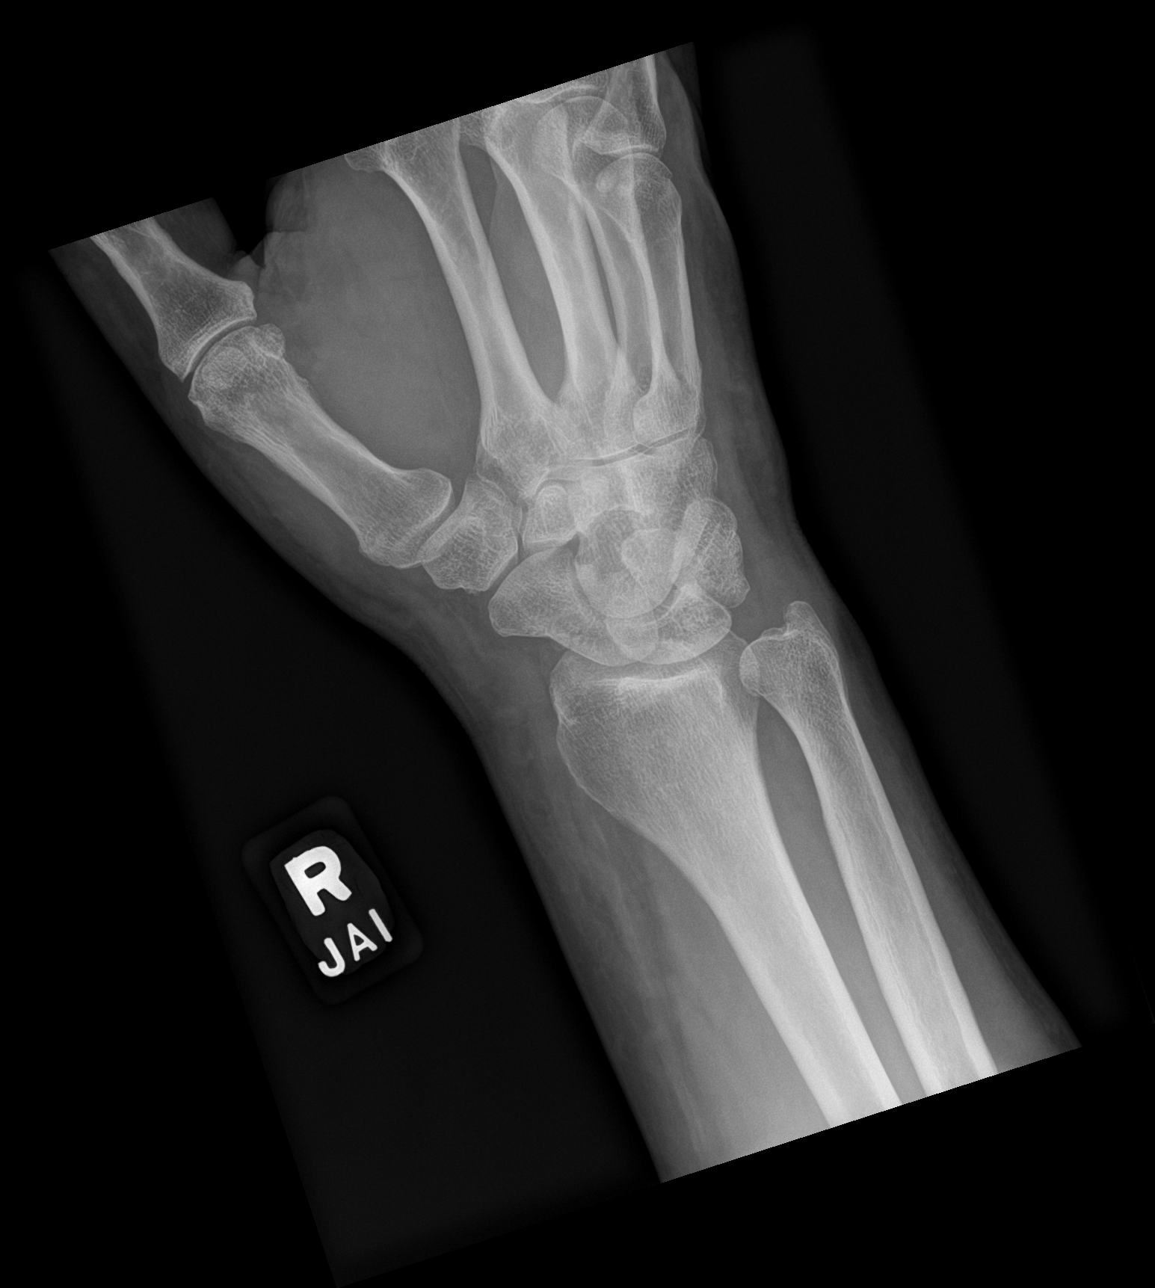

[wrist lat]
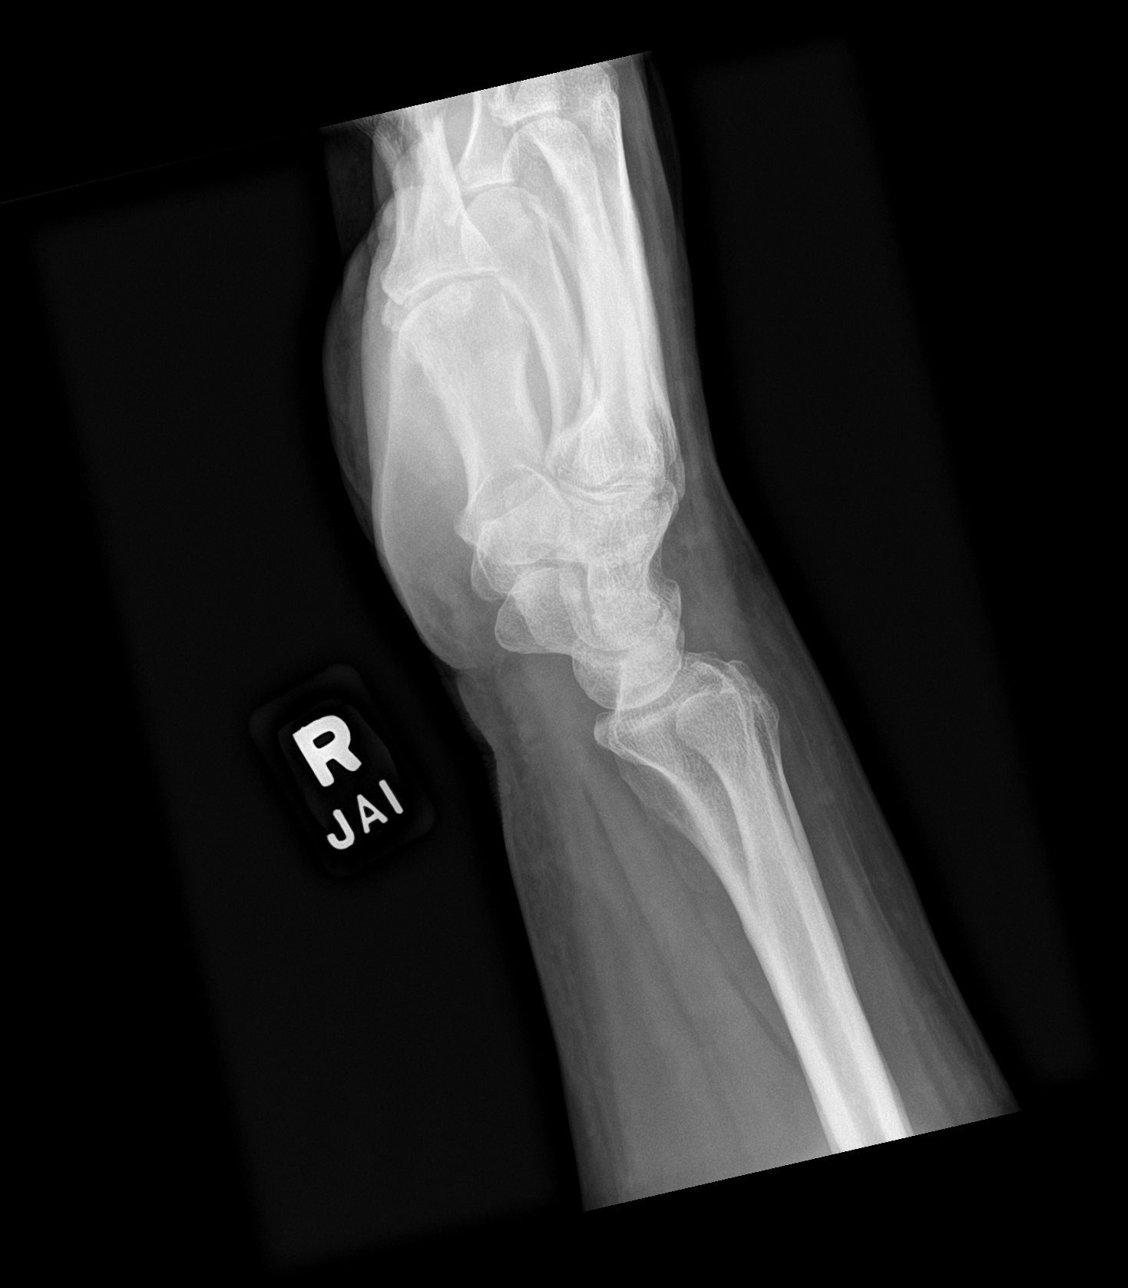

[scaphoid]
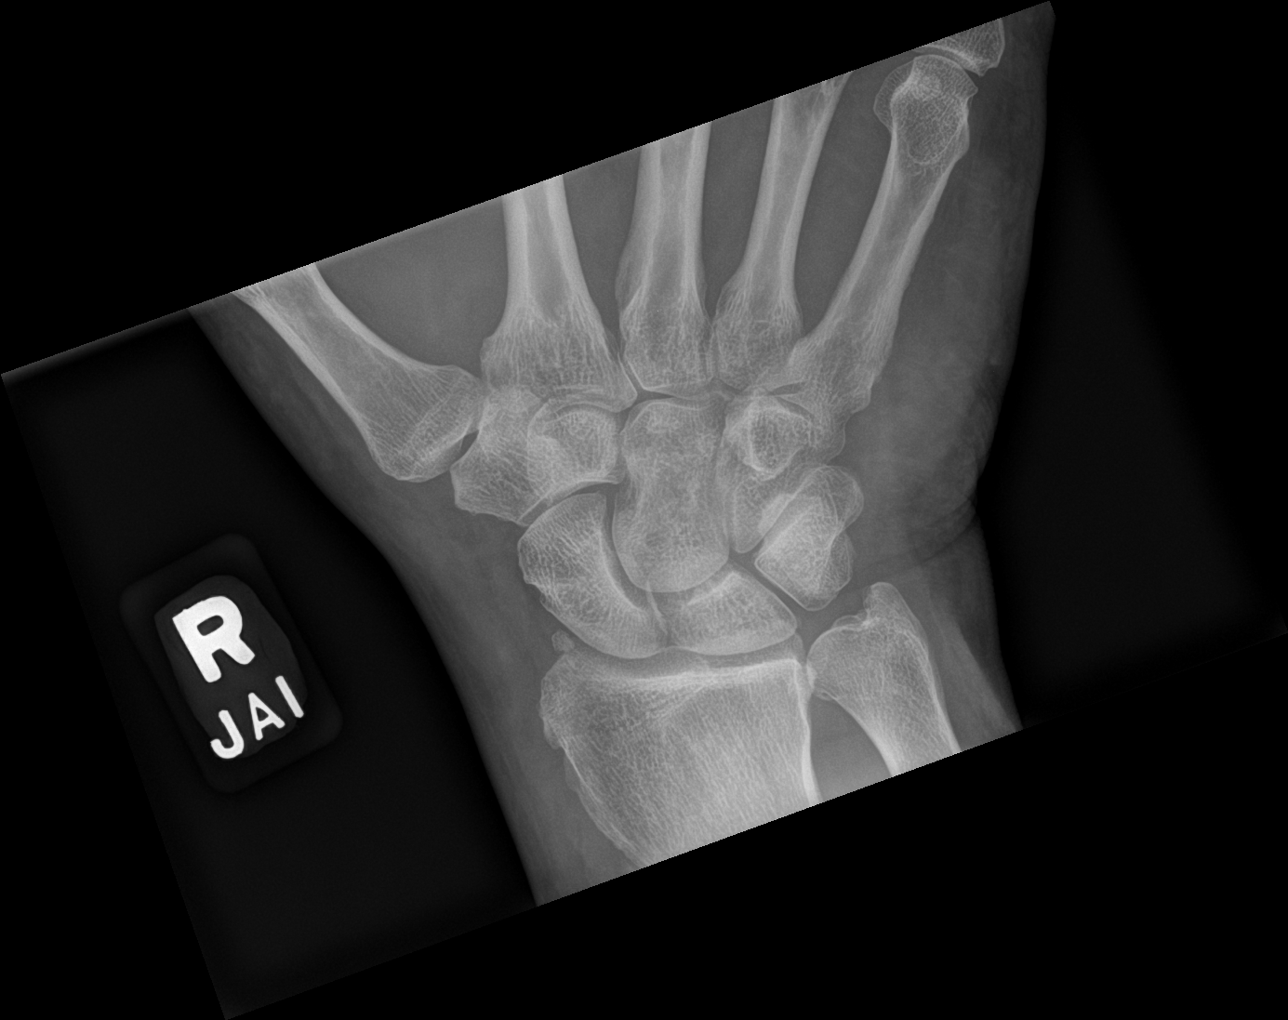

[4 of 4 positions shown; findings below may reference images not displayed]

FINDINGS: There is no evidence of fracture or dislocation. Tiny ossicle just
off the radial styloid is likely an accessory ossicle. There is no
evidence of arthropathy or other focal bone abnormality. Soft
tissues are unremarkable.
IMPRESSION: No acute abnormality or finding to explain patient's symptoms.

## 2022-07-02 DIAGNOSIS — M1711 Unilateral primary osteoarthritis, right knee: Secondary | ICD-10-CM | POA: Diagnosis not present

## 2022-07-19 DIAGNOSIS — R310 Gross hematuria: Secondary | ICD-10-CM | POA: Diagnosis not present

## 2022-07-24 DIAGNOSIS — N3040 Irradiation cystitis without hematuria: Secondary | ICD-10-CM | POA: Diagnosis not present

## 2022-07-24 DIAGNOSIS — R310 Gross hematuria: Secondary | ICD-10-CM | POA: Diagnosis not present

## 2022-07-24 DIAGNOSIS — R311 Benign essential microscopic hematuria: Secondary | ICD-10-CM | POA: Diagnosis not present

## 2022-07-25 ENCOUNTER — Other Ambulatory Visit: Payer: Self-pay | Admitting: Urology

## 2022-07-27 NOTE — Patient Instructions (Signed)
SURGICAL WAITING ROOM VISITATION Patients having surgery or a procedure may have no more than 2 support people in the waiting area - these visitors may rotate in the visitor waiting room.   Due to an increase in RSV and influenza rates and associated hospitalizations, children ages 21 and under may not visit patients in [place]. If the patient needs to stay at the hospital during part of their recovery, the visitor guidelines for inpatient rooms apply.  PRE-OP VISITATION  Pre-op nurse will coordinate an appropriate time for 1 support person to accompany the patient in pre-op.  This support person may not rotate.  This visitor will be contacted when the time is appropriate for the visitor to come back in the pre-op area.  Please refer to the [facility] website for the visitor guidelines for Inpatients (after your surgery is over and you are in a regular room).  You are not required to quarantine at this time prior to your surgery. However, you must do this: Hand Hygiene often Do NOT share personal items Notify your provider if you are in close contact with someone who has COVID or you develop fever 100.4 or greater, new onset of sneezing, cough, sore throat, shortness of breath or body aches.  If you test positive for Covid or have been in contact with anyone that has tested positive in the last 10 days please notify you surgeon.    Your procedure is scheduled on: Friday  August 02, 2022   Report to [facility] Main Entrance: [place] entrance where the [employer] is available.   Report to admitting at: 11:30  AM  +++++Call this number if you have any questions or problems the morning of surgery [phone]  DO NOT EAT OR DRINK ANYTHING AFTER MIDNIGHT THE NIGHT PRIOR TO YOUR SURGERY / PROCEDURE.   FOLLOW BOWEL PREP AND ANY ADDITIONAL PRE OP INSTRUCTIONS YOU RECEIVED FROM YOUR SURGEON'S OFFICE!!!   Oral Hygiene is also important to reduce your risk of  infection.        Remember - BRUSH YOUR TEETH THE MORNING OF SURGERY WITH YOUR REGULAR TOOTHPASTE  Do NOT smoke after Midnight the night before surgery.  Take ONLY these medicines the morning of surgery with A SIP OF WATER: Augmentin  You may not have any metal on your body including jewelry, and body piercing  Do not wear  lotions, powders, cologne, or deodorant  Men may shave face and neck.  Contacts, Hearing Aids, dentures or bridgework may not be worn into surgery. DENTURES WILL BE REMOVED PRIOR TO SURGERY PLEASE DO NOT APPLY "Poly grip" OR ADHESIVES!!!   Patients discharged on the day of surgery will not be allowed to drive home.  Someone NEEDS to stay with you for the first 24 hours after anesthesia.  Do not bring your home medications to the hospital. The Pharmacy will dispense medications listed on your medication list to you during your admission in the Hospital.  Special Instructions: Bring a copy of your healthcare power of attorney and living will documents the day of surgery, if you wish to have them scanned into your [facility] Records- EPIC  Please read over the following fact sheets you were given: IF YOU HAVE QUESTIONS ABOUT YOUR PRE-OP INSTRUCTIONS, PLEASE CALL [phone]  ([place])   [place] - Preparing for Surgery Before surgery, you can play an important role.  Because skin is not sterile, your skin needs to be as free of germs as possible.  You can reduce the number of germs on your skin by washing with CHG (chlorahexidine gluconate) soap before surgery.  CHG is an antiseptic cleaner which kills germs and bonds with the skin to continue killing germs even after washing. Please DO NOT use if you have an allergy to CHG or antibacterial soaps.  If your skin becomes reddened/irritated stop using the CHG and inform your nurse when you arrive at Short Stay. Do not shave (including legs and underarms) for at least 48 hours prior to the first CHG shower.  You may  shave your face/neck.  Please follow these instructions carefully:  1.  Shower with CHG Soap the night before surgery and the  morning of surgery.  2.  If you choose to wash your hair, wash your hair first as usual with your normal  shampoo.  3.  After you shampoo, rinse your hair and body thoroughly to remove the shampoo.                             4.  Use CHG as you would any other liquid soap.  You can apply chg directly to the skin and wash.  Gently with a scrungie or clean washcloth.  5.  Apply the CHG Soap to your body ONLY FROM THE NECK DOWN.   Do not use on face/ open                           Wound or open sores. Avoid contact with eyes, ears mouth and genitals (private parts).                       Wash face,  Genitals (private parts) with your normal soap.             6.  Wash thoroughly, paying special attention to the area where your  surgery  will be performed.  7.  Thoroughly rinse your body with warm water from the neck down.  8.  DO NOT shower/wash with your normal soap after using and rinsing off the CHG Soap.            9.  Pat yourself dry with a clean towel.            10.  Wear clean pajamas.            11.  Place clean sheets on your bed the night of your first shower and do not  sleep with pets.  ON THE DAY OF SURGERY : Do not apply any lotions/deodorants the morning of surgery.  Please wear clean clothes to the hospital/surgery center.    FAILURE TO FOLLOW THESE INSTRUCTIONS MAY RESULT IN THE CANCELLATION OF YOUR SURGERY  PATIENT SIGNATURE_________________________________  NURSE SIGNATURE__________________________________  ________________________________________________________________________

## 2022-07-27 NOTE — Progress Notes (Signed)
COVID Vaccine received:  []$  No [x]$  Yes Date of any COVID positive Test in last 90 days:  PCP -  Darnelle Catalan, MD Cardiologist - none  Chest x-ray - 05-21-2021  2v  epic EKG -  02-06-2022 epic Stress Test -  ECHO -  Cardiac Cath -   PCR screen: []$  Ordered & Completed                      []$   No Order but Needs PROFEND                      [x]$   N/A for this surgery  Surgery Plan:  [x]$  Ambulatory                            []$  Outpatient in bed                            []$  Admit  Anesthesia:    [x]$  General  []$  Spinal                           []$   Choice []$   MAC  Bowel Prep - []$  No  []$   Yes _____________  Pacemaker / ICD device [x]$  No []$  Yes        Device order form faxed [x]$  No    []$   Yes      Faxed to:  Spinal Cord Stimulator:[x]$  No []$  Yes      (Remind patient to bring remote DOS) Other Implants:   History of Sleep Apnea? [x]$  No []$  Yes   CPAP used?- [x]$  No []$  Yes    Does the patient monitor blood sugar? []$  No []$  Yes  []$  N/A  Patient has: [x]$  Pre-DM   []$  DM1  []$   DM2 Does patient have a [employer] or Dexacom? []$  No []$  Yes   Fasting Blood Sugar Ranges-  Checks Blood Sugar _____ times a day  Last dose of GLP1 agonist-  GLP1 instructions:  Last dose of SGLT-2 inhibitors-  SGLT-2 instructions:  Other Diabetic medications/ instructions:   Blood Thinner / Instructions: Aspirin Instructions:  ERAS Protocol Ordered: [x]$  No  []$  Yes Patient is to be NPO after: midnight prior  Comments:   Activity level: Patient can / can not climb a flight of stairs without difficulty; []$  No CP  []$  No SOB, but would have ______   Patient can / can not perform ADLs without assistance.   Anesthesia review: HTN, Pre-DM, Prostate Ca  Patient denies shortness of breath, fever, cough and chest pain at PAT appointment.  Patient verbalized understanding and agreement to the Pre-Surgical Instructions that were given to them at this PAT appointment. Patient was also educated of the  need to review these PAT instructions again prior to his/her surgery.I reviewed the appropriate phone numbers to call if they have any and questions or concerns.

## 2022-07-28 ENCOUNTER — Other Ambulatory Visit: Payer: Self-pay

## 2022-07-28 ENCOUNTER — Emergency Department (HOSPITAL_COMMUNITY)
Admission: EM | Admit: 2022-07-28 | Discharge: 2022-07-28 | Disposition: A | Discharge status: Home / Self Care | Payer: Medicare Other | Attending: Emergency Medicine | Admitting: Emergency Medicine

## 2022-07-28 DIAGNOSIS — Z79899 Other long term (current) drug therapy: Secondary | ICD-10-CM | POA: Diagnosis not present

## 2022-07-28 DIAGNOSIS — I10 Essential (primary) hypertension: Secondary | ICD-10-CM | POA: Insufficient documentation

## 2022-07-28 DIAGNOSIS — Z8546 Personal history of malignant neoplasm of prostate: Secondary | ICD-10-CM | POA: Diagnosis not present

## 2022-07-28 DIAGNOSIS — R339 Retention of urine, unspecified: Secondary | ICD-10-CM | POA: Insufficient documentation

## 2022-07-28 LAB — CBC WITH DIFFERENTIAL/PLATELET
Abs Immature Granulocytes: 0.02 10*3/uL (ref 0.00–0.07)
Basophils Absolute: 0 10*3/uL (ref 0.0–0.1)
Basophils Relative: 0 %
Eosinophils Absolute: 0.3 10*3/uL (ref 0.0–0.5)
Eosinophils Relative: 6 %
HCT: 31.3 % — ABNORMAL LOW (ref 39.0–52.0)
Hemoglobin: 9.8 g/dL — ABNORMAL LOW (ref 13.0–17.0)
Immature Granulocytes: 0 %
Lymphocytes Relative: 27 %
Lymphs Abs: 1.4 10*3/uL (ref 0.7–4.0)
MCH: 26.1 pg (ref 26.0–34.0)
MCHC: 31.3 g/dL (ref 30.0–36.0)
MCV: 83.5 fL (ref 80.0–100.0)
Monocytes Absolute: 0.4 10*3/uL (ref 0.1–1.0)
Monocytes Relative: 7 %
Neutro Abs: 2.9 10*3/uL (ref 1.7–7.7)
Neutrophils Relative %: 60 %
Platelets: 184 10*3/uL (ref 150–400)
RBC: 3.75 MIL/uL — ABNORMAL LOW (ref 4.22–5.81)
RDW: 15.9 % — ABNORMAL HIGH (ref 11.5–15.5)
WBC: 4.9 10*3/uL (ref 4.0–10.5)
nRBC: 0 % (ref 0.0–0.2)

## 2022-07-28 LAB — BASIC METABOLIC PANEL
Anion gap: 12 (ref 5–15)
BUN: 21 mg/dL (ref 8–23)
CO2: 23 mmol/L (ref 22–32)
Calcium: 9.7 mg/dL (ref 8.9–10.3)
Chloride: 104 mmol/L (ref 98–111)
Creatinine, Ser: 1.07 mg/dL (ref 0.61–1.24)
GFR, Estimated: >60 mL/min (ref >60)
Glucose, Bld: 108 mg/dL — ABNORMAL HIGH (ref 70–99)
Potassium: 3.6 mmol/L (ref 3.5–5.1)
Sodium: 139 mmol/L (ref 135–145)

## 2022-07-28 NOTE — Discharge Instructions (Signed)
I would call Dr. Purvis Sheffield office Monday and let them know foley had to be placed, he will give you further directions. Continue your antibiotics from urology office. Return here for new concerns.

## 2022-07-28 NOTE — ED Provider Notes (Signed)
[place] EMERGENCY DEPARTMENT AT [facility] Provider Note   CSN: [id number] Arrival date & time: 07/28/22  0305     History  Chief Complaint  Patient presents with   Urinary Retention    [name] [name] is a 72 y.o. male.  The history is provided by the patient and medical records.   72 year old male with history of BPH, dyslipidemia, hypertension, history of prostate cancer, presenting to the ED with difficulty urinating.  He states he has been up and down all morning trying to use the bathroom but has not been able to pass any urine.  He has been having bloody urine over the past 12 days or so, currently on augmentin from urology office.  He is due for cystoscopy with pyelogram on 08/02/2022 with Dr. Gloriann Loan for further evaluation of his hematuria.  He has not had any fever or chills.  Denies dysuria.  He was able to urinate about 100cc upon arrival to treatment room, however still feels urge to urinate and discomfort.  Home Medications Prior to Admission medications   Medication Sig Start Date End Date Taking? Authorizing Provider  ALPRAZolam (XANAX) 0.5 MG tablet TAKE 1 TABLET BY MOUTH EVERY DAY AS NEEDED FOR ANXIETY Patient taking differently: Take 0.5 mg by mouth at bedtime as needed for sleep. 12/04/21  Yes SagardiaInes Bloomer, MD  amoxicillin-clavulanate (AUGMENTIN) 875-125 MG tablet Take 1 tablet by mouth 2 (two) times daily. 07/26/22  Yes [provider]  lisinopril-hydrochlorothiazide (ZESTORETIC) 20-12.5 MG tablet TAKE 1 TABLET BY MOUTH EVERY DAY Patient taking differently: Take 1 tablet by mouth daily. 11/25/21  Yes Sagardia, Ines Bloomer, MD  meloxicam (MOBIC) 15 MG tablet Take 15 mg by mouth daily.   Yes [provider]  rosuvastatin (CRESTOR) 10 MG tablet TAKE 1 TABLET BY MOUTH EVERY DAY Patient taking differently: Take by mouth daily. 02/20/22  Yes Sagardia, Ines Bloomer, MD  acetaminophen (TYLENOL) 500 MG tablet Take 2 tablets (1,000 mg total)  by mouth every 6 (six) hours as needed. Patient not taking: Reported on 07/28/2022 03/15/22 03/15/23  Chriss Czar, PA-C  oxyCODONE (OXY IR/ROXICODONE) 5 MG immediate release tablet Take one tab po q4-6hrs prn pain Patient not taking: Reported on 07/28/2022 03/15/22   Chriss Czar, PA-C  sulfamethoxazole-trimethoprim (BACTRIM DS) 800-160 MG tablet Take 1 tablet by mouth 2 (two) times daily. Patient not taking: Reported on 07/28/2022 03/16/22   Chadwell, Vonna Kotyk, PA-C  tiZANidine (ZANAFLEX) 4 MG tablet Take 1 tablet (4 mg total) by mouth 3 (three) times daily. Patient not taking: Reported on 07/28/2022 03/15/22 03/15/23  Chriss Czar, PA-C      Allergies    Aspirin, Statins, and Gadolinium derivatives    Review of Systems   Review of Systems  Genitourinary:  Positive for difficulty urinating and hematuria.  All other systems reviewed and are negative.   Physical Exam Updated Vital Signs BP (!) 146/91 (BP Location: Left Arm)   Pulse 69   Temp 98.5 F (36.9 C) (Oral)   Resp 18   Ht 6' 2"$  (1.88 m)   Wt 129.3 kg   SpO2 100%   BMI 36.59 kg/m   Physical Exam Vitals and nursing note reviewed.  Constitutional:      Appearance: He is well-developed.  HENT:     Head: Normocephalic and atraumatic.  Eyes:     Conjunctiva/sclera: Conjunctivae normal.     Pupils: Pupils are equal, round, and reactive to light.  Cardiovascular:     Rate and Rhythm:  Normal rate and regular rhythm.     Heart sounds: Normal heart sounds.  Pulmonary:     Effort: Pulmonary effort is normal.     Breath sounds: Normal breath sounds.  Abdominal:     General: Bowel sounds are normal.     Palpations: Abdomen is soft.  Musculoskeletal:        General: Normal range of motion.     Cervical back: Normal range of motion.  Skin:    General: Skin is warm and dry.  Neurological:     Mental Status: He is alert and oriented to person, place, and time.     ED Results / Procedures / Treatments   Labs (all  labs ordered are listed, but only abnormal results are displayed) Labs Reviewed  CBC WITH DIFFERENTIAL/PLATELET - Abnormal; Notable for the following components:      Result Value   RBC 3.75 (*)    Hemoglobin 9.8 (*)    HCT 31.3 (*)    RDW 15.9 (*)    All other components within normal limits  BASIC METABOLIC PANEL - Abnormal; Notable for the following components:   Glucose, Bld 108 (*)    All other components within normal limits    EKG None  Radiology No results found.  Procedures Procedures    Medications Ordered in ED Medications - No data to display  ED Course/ Medical Decision Making/ A&P                             Medical Decision Making Amount and/or Complexity of Data Reviewed Labs: ordered. ECG/medicine tests: ordered and independent interpretation performed.   72 y.o. M here with urinary retention.  Hx of same related to BPH.  Currently followed by urology for ongoing hematuria x12 days, on Augmentin and due to cystoscopy on 08/02/22 with Dr. Gloriann Loan.    Afebrile, non-toxic.  Abdomen soft, no CVA tenderness.  Was able to urinate a small amount on arrival to treatment room, only about 100cc but still feeling urge to go.  Discussed options of foley catheter placement with patient and wife, they are agreeable.  Labs were obtained, hemoglobin downtrended to 9.8 today (prior 11.3).  suspect this is due to his hematuria.  He is not on anticoagulation.  No leukocytosis.  Renal function WNL.  Foley placed and drained out about another 600cc of bloody urine.  Patient reports feeling much better, no pain currently.  Will leave foley in place for now, wife will call urology office Monday and update Dr. Gloriann Loan about ER visit this weekend. Continue augmentin as per urology team.  Can return here for any new/acute changes.  Final Clinical Impression(s) / ED Diagnoses Final diagnoses:  Urinary retention    Rx / DC Orders ED Discharge Orders     None         Larene Pickett, PA-C 07/28/22 0516    Ezequiel Essex, MD 07/28/22 [phone]

## 2022-07-28 NOTE — ED Notes (Signed)
Pt taken back to room. Requested to undress into hospital gown provided. Pt given urinal as he states urge to urinate. S/o at bedside. Primary RN informed of pts arrival

## 2022-07-28 NOTE — ED Triage Notes (Addendum)
Pt c/o difficulty urinating that started this morning. Attempted to go to the bathroom several times this morning without success. C/o lower abdomen discomfort.   Follows up with Urology Dr Gloriann Loan. Has been having bloody with clots x12 days. Is currently taking azithromycin. Pt is schedule for CYSTOSCOPY WITH BILATERAL RETROGRADE PYELOGRAM on 2/23.  Bladder Scan 569

## 2022-07-30 ENCOUNTER — Encounter (HOSPITAL_COMMUNITY): Payer: Self-pay

## 2022-07-30 ENCOUNTER — Encounter (HOSPITAL_COMMUNITY)
Admission: RE | Admit: 2022-07-30 | Discharge: 2022-07-30 | Disposition: A | Discharge status: Home / Self Care | Payer: Medicare Other | Source: Ambulatory Visit | Attending: Urology | Admitting: Urology

## 2022-07-30 ENCOUNTER — Other Ambulatory Visit: Payer: Self-pay

## 2022-07-30 VITALS — BP 100/60 | HR 96 | Temp 98.6°F | Resp 20 | Ht 74.0 in | Wt 285.0 lb

## 2022-07-30 DIAGNOSIS — Z01812 Encounter for preprocedural laboratory examination: Secondary | ICD-10-CM | POA: Insufficient documentation

## 2022-07-30 DIAGNOSIS — R7303 Prediabetes: Secondary | ICD-10-CM | POA: Diagnosis not present

## 2022-07-30 DIAGNOSIS — I10 Essential (primary) hypertension: Secondary | ICD-10-CM | POA: Diagnosis not present

## 2022-07-30 HISTORY — DX: Personal history of urinary calculi: Z87.442

## 2022-07-30 LAB — HEMOGLOBIN A1C
Hgb A1c MFr Bld: 5.6 % (ref 4.8–5.6)
Mean Plasma Glucose: 114.02 mg/dL

## 2022-07-30 LAB — CBC
HCT: 31.5 % — ABNORMAL LOW (ref 39.0–52.0)
Hemoglobin: 9.6 g/dL — ABNORMAL LOW (ref 13.0–17.0)
MCH: 26.2 pg (ref 26.0–34.0)
MCHC: 30.5 g/dL (ref 30.0–36.0)
MCV: 85.8 fL (ref 80.0–100.0)
Platelets: 219 10*3/uL (ref 150–400)
RBC: 3.67 MIL/uL — ABNORMAL LOW (ref 4.22–5.81)
RDW: 16.3 % — ABNORMAL HIGH (ref 11.5–15.5)
WBC: 6.4 10*3/uL (ref 4.0–10.5)
nRBC: 0 % (ref 0.0–0.2)

## 2022-07-30 LAB — BASIC METABOLIC PANEL
Anion gap: 11 (ref 5–15)
BUN: 25 mg/dL — ABNORMAL HIGH (ref 8–23)
CO2: 24 mmol/L (ref 22–32)
Calcium: 9.6 mg/dL (ref 8.9–10.3)
Chloride: 103 mmol/L (ref 98–111)
Creatinine, Ser: 1.08 mg/dL (ref 0.61–1.24)
GFR, Estimated: >60 mL/min (ref >60)
Glucose, Bld: 96 mg/dL (ref 70–99)
Potassium: 3.7 mmol/L (ref 3.5–5.1)
Sodium: 138 mmol/L (ref 135–145)

## 2022-07-30 LAB — GLUCOSE, CAPILLARY: Glucose-Capillary: 115 mg/dL — ABNORMAL HIGH (ref 70–99)

## 2022-07-31 ENCOUNTER — Emergency Department (HOSPITAL_COMMUNITY)
Admission: EM | Admit: 2022-07-31 | Discharge: 2022-07-31 | Disposition: A | Discharge status: Home / Self Care | Payer: Medicare Other | Attending: Emergency Medicine | Admitting: Emergency Medicine

## 2022-07-31 ENCOUNTER — Encounter (HOSPITAL_COMMUNITY): Payer: Self-pay

## 2022-07-31 DIAGNOSIS — R3915 Urgency of urination: Secondary | ICD-10-CM | POA: Diagnosis not present

## 2022-07-31 DIAGNOSIS — X58XXXA Exposure to other specified factors, initial encounter: Secondary | ICD-10-CM | POA: Diagnosis not present

## 2022-07-31 DIAGNOSIS — T83091A Other mechanical complication of indwelling urethral catheter, initial encounter: Secondary | ICD-10-CM | POA: Diagnosis not present

## 2022-07-31 MED ORDER — FENTANYL CITRATE PF 50 MCG/ML IJ SOSY
100.0000 ug | PREFILLED_SYRINGE | Freq: Once | INTRAMUSCULAR | Status: AC
  Administered 2022-07-31: 100 ug via INTRAVENOUS
  Filled 2022-07-31: qty 2

## 2022-07-31 NOTE — ED Notes (Signed)
Bladder irrigated with a total of 333m sterile NS. Clots from urinary catheter present, pt switched to a standard drainage bag. 9550mdrained from bladder. Bladder scan showed 44m63most irrigation/drainage

## 2022-07-31 NOTE — ED Provider Notes (Signed)
[facility] Provider Note   CSN: [id number] Arrival date & time: 07/31/22  [id number]     History  Chief Complaint  Patient presents with   Urinary Retention    Dorothy Stanard is a 72 y.o. male.  72 yo M with a chief complaint of his catheter not draining.  He thinks he stopped antibiotics about an hour or so ago.  Having pain to the area.  Feels that he is urinated was unable to.         Home Medications Prior to Admission medications   Medication Sig Start Date End Date Taking? Authorizing Provider  acetaminophen (TYLENOL) 500 MG tablet Take 2 tablets (1,000 mg total) by mouth every 6 (six) hours as needed. Patient not taking: Reported on 07/28/2022 03/15/22 03/15/23  Chadwell, Vonna Kotyk, PA-C  ALPRAZolam Duanne Moron) 0.5 MG tablet TAKE 1 TABLET BY MOUTH EVERY DAY AS NEEDED FOR ANXIETY Patient taking differently: Take 0.5 mg by mouth at bedtime as needed for sleep. 12/04/21   Horald Pollen, MD  amoxicillin-clavulanate (AUGMENTIN) 875-125 MG tablet Take 1 tablet by mouth 2 (two) times daily. 07/26/22   [provider]  lisinopril-hydrochlorothiazide (ZESTORETIC) 20-12.5 MG tablet TAKE 1 TABLET BY MOUTH EVERY DAY Patient taking differently: Take 1 tablet by mouth daily. 11/25/21   Horald Pollen, MD  meloxicam (MOBIC) 15 MG tablet Take 15 mg by mouth daily.    [provider]  oxyCODONE (OXY IR/ROXICODONE) 5 MG immediate release tablet Take one tab po q4-6hrs prn pain Patient not taking: Reported on 07/28/2022 03/15/22   Chadwell, Vonna Kotyk, PA-C  rosuvastatin (CRESTOR) 10 MG tablet TAKE 1 TABLET BY MOUTH EVERY DAY Patient taking differently: Take by mouth daily. 02/20/22   Horald Pollen, MD  sulfamethoxazole-trimethoprim (BACTRIM DS) 800-160 MG tablet Take 1 tablet by mouth 2 (two) times daily. Patient not taking: Reported on 07/28/2022 03/16/22   Chadwell, Vonna Kotyk, PA-C  tiZANidine (ZANAFLEX) 4 MG tablet Take 1  tablet (4 mg total) by mouth 3 (three) times daily. Patient not taking: Reported on 07/28/2022 03/15/22 03/15/23  Chriss Czar, PA-C      Allergies    Aspirin, Statins, and Gadolinium derivatives    Review of Systems   Review of Systems  Physical Exam Updated Vital Signs BP (!) 148/72   Pulse 78   Temp 97.8 F (36.6 C)   Resp 18   SpO2 100%  Physical Exam Vitals and nursing note reviewed.  Constitutional:      Appearance: He is well-developed.  HENT:     Head: Normocephalic and atraumatic.  Eyes:     Pupils: Pupils are equal, round, and reactive to light.  Neck:     Vascular: No JVD.  Cardiovascular:     Rate and Rhythm: Normal rate and regular rhythm.     Heart sounds: No murmur heard.    No friction rub. No gallop.  Pulmonary:     Effort: No respiratory distress.     Breath sounds: No wheezing.  Abdominal:     General: There is no distension.     Tenderness: There is no abdominal tenderness. There is no guarding or rebound.  Genitourinary:    Comments: Three-way catheter in place.  No obvious drainage into the bag.  Painful palpable bladder. Musculoskeletal:        General: Normal range of motion.     Cervical back: Normal range of motion and neck supple.  Skin:    Coloration: Skin  is not pale.     Findings: No rash.  Neurological:     Mental Status: He is alert and oriented to person, place, and time.  Psychiatric:        Behavior: Behavior normal.     ED Results / Procedures / Treatments   Labs (all labs ordered are listed, but only abnormal results are displayed) Labs Reviewed - No data to display  EKG None  Radiology No results found.  Procedures Procedures    Medications Ordered in ED Medications  fentaNYL (SUBLIMAZE) injection 100 mcg (100 mcg Intravenous Given 07/31/22 0352)    ED Course/ Medical Decision Making/ A&P                             Medical Decision Making Risk Prescription drug management.   73 yo M with a chief  complaint of inability to urinate.  Patient has a Foley catheter in place was placed few days ago.  Will attempt to irrigate the Foley.  Foley irrigated with significant drainage, patient feeling much better.  Would like to go home.  Urology follow-up.  6:40 AM:  I have discussed the diagnosis/risks/treatment options with the patient and family.  Evaluation and diagnostic testing in the emergency department does not suggest an emergent condition requiring admission or immediate intervention beyond what has been performed at this time.  They will follow up with Urology. We also discussed returning to the ED immediately if new or worsening sx occur. We discussed the sx which are most concerning (e.g., sudden worsening pain, fever, inability to tolerate by mouth) that necessitate immediate return. Medications administered to the patient during their visit and any new prescriptions provided to the patient are listed below.  Medications given during this visit Medications  fentaNYL (SUBLIMAZE) injection 100 mcg (100 mcg Intravenous Given 07/31/22 0352)     The patient appears reasonably screen and/or stabilized for discharge and I doubt any other medical condition or other [facility] requiring further screening, evaluation, or treatment in the ED at this time prior to discharge.          Final Clinical Impression(s) / ED Diagnoses Final diagnoses:  Obstruction of Foley catheter, initial encounter [facility])    Rx / [place] Orders ED Discharge Orders     None         Deno Etienne, DO 07/31/22 [phone]

## 2022-07-31 NOTE — Discharge Instructions (Signed)
Return for recurrent issues with your foley, follow up with your urologist.

## 2022-07-31 NOTE — ED Triage Notes (Signed)
Patient arrived stating he had a urinary catheter placed on Sunday, has seen some blood clots but noticed it stopped draining about an hour an a half ago.

## 2022-08-02 ENCOUNTER — Ambulatory Visit (HOSPITAL_COMMUNITY)
Admission: RE | Admit: 2022-08-02 | Discharge: 2022-08-02 | Disposition: A | Discharge status: Home / Self Care | Payer: Medicare Other | Attending: Urology | Admitting: Urology

## 2022-08-02 ENCOUNTER — Encounter (HOSPITAL_COMMUNITY)
Admission: RE | Disposition: A | Discharge status: Home / Self Care | Payer: Self-pay | Source: Home / Self Care | Attending: Urology

## 2022-08-02 ENCOUNTER — Encounter (HOSPITAL_COMMUNITY): Payer: Self-pay | Admitting: Urology

## 2022-08-02 ENCOUNTER — Ambulatory Visit (HOSPITAL_COMMUNITY): Payer: Medicare Other

## 2022-08-02 ENCOUNTER — Other Ambulatory Visit: Payer: Self-pay

## 2022-08-02 ENCOUNTER — Ambulatory Visit (HOSPITAL_BASED_OUTPATIENT_CLINIC_OR_DEPARTMENT_OTHER): Payer: Medicare Other | Admitting: Certified Registered"

## 2022-08-02 ENCOUNTER — Ambulatory Visit (HOSPITAL_COMMUNITY): Payer: Medicare Other | Admitting: Physician Assistant

## 2022-08-02 DIAGNOSIS — I10 Essential (primary) hypertension: Secondary | ICD-10-CM | POA: Insufficient documentation

## 2022-08-02 DIAGNOSIS — Z8546 Personal history of malignant neoplasm of prostate: Secondary | ICD-10-CM | POA: Diagnosis not present

## 2022-08-02 DIAGNOSIS — E669 Obesity, unspecified: Secondary | ICD-10-CM

## 2022-08-02 DIAGNOSIS — Z6836 Body mass index (BMI) 36.0-36.9, adult: Secondary | ICD-10-CM

## 2022-08-02 DIAGNOSIS — D494 Neoplasm of unspecified behavior of bladder: Secondary | ICD-10-CM

## 2022-08-02 DIAGNOSIS — D630 Anemia in neoplastic disease: Secondary | ICD-10-CM

## 2022-08-02 DIAGNOSIS — R7303 Prediabetes: Secondary | ICD-10-CM

## 2022-08-02 DIAGNOSIS — N3021 Other chronic cystitis with hematuria: Secondary | ICD-10-CM | POA: Insufficient documentation

## 2022-08-02 DIAGNOSIS — Z923 Personal history of irradiation: Secondary | ICD-10-CM | POA: Insufficient documentation

## 2022-08-02 DIAGNOSIS — R310 Gross hematuria: Secondary | ICD-10-CM | POA: Diagnosis not present

## 2022-08-02 DIAGNOSIS — D303 Benign neoplasm of bladder: Secondary | ICD-10-CM | POA: Diagnosis not present

## 2022-08-02 DIAGNOSIS — D649 Anemia, unspecified: Secondary | ICD-10-CM | POA: Diagnosis not present

## 2022-08-02 HISTORY — PX: CYSTOSCOPY W/ RETROGRADES: SHX1426

## 2022-08-02 HISTORY — PX: TRANSURETHRAL RESECTION OF BLADDER TUMOR: SHX2575

## 2022-08-02 SURGERY — TURBT (TRANSURETHRAL RESECTION OF BLADDER TUMOR)
Anesthesia: General

## 2022-08-02 MED ORDER — FENTANYL CITRATE (PF) 100 MCG/2ML IJ SOLN
INTRAMUSCULAR | Status: AC
  Filled 2022-08-02: qty 2

## 2022-08-02 MED ORDER — ROCURONIUM BROMIDE 10 MG/ML (PF) SYRINGE
PREFILLED_SYRINGE | INTRAVENOUS | Status: DC | PRN
  Administered 2022-08-02: 70 mg via INTRAVENOUS

## 2022-08-02 MED ORDER — FENTANYL CITRATE PF 50 MCG/ML IJ SOSY: 25.0000 ug | PREFILLED_SYRINGE | INTRAMUSCULAR | Status: DC | PRN

## 2022-08-02 MED ORDER — EPHEDRINE SULFATE-NACL 50-0.9 MG/10ML-% IV SOSY
PREFILLED_SYRINGE | INTRAVENOUS | Status: DC | PRN
  Administered 2022-08-02 (×2): 5 mg via INTRAVENOUS

## 2022-08-02 MED ORDER — LIDOCAINE 2% (20 MG/ML) 5 ML SYRINGE
INTRAMUSCULAR | Status: DC | PRN
  Administered 2022-08-02: 100 mg via INTRAVENOUS

## 2022-08-02 MED ORDER — PHENYLEPHRINE 80 MCG/ML (10ML) SYRINGE FOR IV PUSH (FOR BLOOD PRESSURE SUPPORT)
PREFILLED_SYRINGE | INTRAVENOUS | Status: DC | PRN
  Administered 2022-08-02: 160 ug via INTRAVENOUS
  Administered 2022-08-02: 80 ug via INTRAVENOUS
  Administered 2022-08-02: 160 ug via INTRAVENOUS

## 2022-08-02 MED ORDER — IOHEXOL 300 MG/ML  SOLN
INTRAMUSCULAR | Status: DC | PRN
  Administered 2022-08-02: 16 mL

## 2022-08-02 MED ORDER — FENTANYL CITRATE PF 50 MCG/ML IJ SOSY
PREFILLED_SYRINGE | INTRAMUSCULAR | Status: AC
  Administered 2022-08-02: 50 ug via INTRAVENOUS
  Filled 2022-08-02: qty 2

## 2022-08-02 MED ORDER — DEXAMETHASONE SODIUM PHOSPHATE 10 MG/ML IJ SOLN
INTRAMUSCULAR | Status: DC | PRN
  Administered 2022-08-02: 10 mg via INTRAVENOUS

## 2022-08-02 MED ORDER — FENTANYL CITRATE (PF) 100 MCG/2ML IJ SOLN
INTRAMUSCULAR | Status: DC | PRN
  Administered 2022-08-02 (×2): 50 ug via INTRAVENOUS

## 2022-08-02 MED ORDER — ACETAMINOPHEN 10 MG/ML IV SOLN
1000.0000 mg | Freq: Once | INTRAVENOUS | Status: DC | PRN
  Administered 2022-08-02: 1000 mg via INTRAVENOUS

## 2022-08-02 MED ORDER — ONDANSETRON HCL 4 MG/2ML IJ SOLN: 4.0000 mg | Freq: Once | INTRAMUSCULAR | Status: DC | PRN

## 2022-08-02 MED ORDER — OXYCODONE HCL 5 MG PO TABS
ORAL_TABLET | ORAL | Status: AC
  Filled 2022-08-02: qty 1

## 2022-08-02 MED ORDER — OXYCODONE HCL 5 MG PO TABS
5.0000 mg | ORAL_TABLET | Freq: Once | ORAL | Status: AC | PRN
  Administered 2022-08-02: 5 mg via ORAL

## 2022-08-02 MED ORDER — LACTATED RINGERS IV SOLN: INTRAVENOUS | Status: DC

## 2022-08-02 MED ORDER — OXYCODONE HCL 5 MG/5ML PO SOLN: 5.0000 mg | Freq: Once | ORAL | Status: AC | PRN

## 2022-08-02 MED ORDER — CHLORHEXIDINE GLUCONATE 0.12 % MT SOLN
15.0000 mL | Freq: Once | OROMUCOSAL | Status: AC
  Administered 2022-08-02: 15 mL via OROMUCOSAL

## 2022-08-02 MED ORDER — ACETAMINOPHEN 10 MG/ML IV SOLN
INTRAVENOUS | Status: AC
  Filled 2022-08-02: qty 100

## 2022-08-02 MED ORDER — CEFAZOLIN IN SODIUM CHLORIDE 3-0.9 GM/100ML-% IV SOLN
3.0000 g | INTRAVENOUS | Status: AC
  Administered 2022-08-02: 3 g via INTRAVENOUS
  Filled 2022-08-02: qty 100

## 2022-08-02 MED ORDER — ORAL CARE MOUTH RINSE: 15.0000 mL | Freq: Once | OROMUCOSAL | Status: AC

## 2022-08-02 MED ORDER — SODIUM CHLORIDE 0.9 % IR SOLN
Status: DC | PRN
  Administered 2022-08-02: 3000 mL via INTRAVESICAL

## 2022-08-02 MED ORDER — CEFAZOLIN SODIUM-DEXTROSE 2-4 GM/100ML-% IV SOLN: 2.0000 g | INTRAVENOUS | Status: DC

## 2022-08-02 MED ORDER — ONDANSETRON HCL 4 MG/2ML IJ SOLN
INTRAMUSCULAR | Status: DC | PRN
  Administered 2022-08-02: 4 mg via INTRAVENOUS

## 2022-08-02 MED ORDER — PROPOFOL 10 MG/ML IV BOLUS
INTRAVENOUS | Status: DC | PRN
  Administered 2022-08-02: 200 mg via INTRAVENOUS

## 2022-08-02 MED ORDER — SUGAMMADEX SODIUM 500 MG/5ML IV SOLN
INTRAVENOUS | Status: DC | PRN
  Administered 2022-08-02: 300 mg via INTRAVENOUS

## 2022-08-02 SURGICAL SUPPLY — 24 items
BAG URINE DRAIN 2000ML AR STRL (UROLOGICAL SUPPLIES) IMPLANT
BAG URO CATCHER STRL LF (MISCELLANEOUS) ×2 IMPLANT
CATH FOLEY 2WAY 5CC 20FR (CATHETERS) IMPLANT
CATH FOLEY 2WAY SLVR  5CC 18FR (CATHETERS)
CATH FOLEY 2WAY SLVR 5CC 18FR (CATHETERS) IMPLANT
CATH URETL OPEN END 6FR 70 (CATHETERS) IMPLANT
CLOTH BEACON ORANGE TIMEOUT ST (SAFETY) ×2 IMPLANT
DRAPE FOOT SWITCH (DRAPES) ×2 IMPLANT
ELECT REM PT RETURN 15FT ADLT (MISCELLANEOUS) ×2 IMPLANT
GLOVE BIO SURGEON STRL SZ7.5 (GLOVE) ×2 IMPLANT
GLOVE BIO SURGEON STRL SZ8 (GLOVE) ×2 IMPLANT
GOWN STRL REUS W/ TWL XL LVL3 (GOWN DISPOSABLE) ×2 IMPLANT
GOWN STRL REUS W/TWL XL LVL3 (GOWN DISPOSABLE) ×2
GUIDEWIRE STR DUAL SENSOR (WIRE) ×2 IMPLANT
KIT TURNOVER KIT A (KITS) IMPLANT
LOOP CUT BIPOLAR 24F LRG (ELECTROSURGICAL) IMPLANT
MANIFOLD NEPTUNE II (INSTRUMENTS) ×2 IMPLANT
NS IRRIG 1000ML POUR BTL (IV SOLUTION) IMPLANT
PACK CYSTO (CUSTOM PROCEDURE TRAY) ×2 IMPLANT
PLUG CATH AND CAP STER (CATHETERS) IMPLANT
SYR TOOMEY IRRIG 70ML (MISCELLANEOUS)
SYRINGE TOOMEY IRRIG 70ML (MISCELLANEOUS) IMPLANT
TUBING CONNECTING 10 (TUBING) ×2 IMPLANT
TUBING UROLOGY SET (TUBING) ×2 IMPLANT

## 2022-08-02 NOTE — Discharge Instructions (Signed)

## 2022-08-02 NOTE — Transfer of Care (Signed)
Immediate Anesthesia Transfer of Care Note  Patient: [name] [name]  Procedure(s) Performed: POSSIBLE TRANSURETHRAL RESECTION OF BLADDER TUMOR (TURBT) CYSTOSCOPY WITH BILATERAL RETROGRADE PYELOGRAM (Bilateral)  Patient Location: PACU  Anesthesia Type:General  Level of Consciousness: sedated  Airway & Oxygen Therapy: Patient Spontanous Breathing and Patient connected to face mask oxygen  Post-op Assessment: Report given to RN and Post -op Vital signs reviewed and stable  Post vital signs: Reviewed and stable  Last Vitals:  Vitals Value Taken Time  BP 121/75 08/02/22 1426  Temp    Pulse 82 08/02/22 1429  Resp 17 08/02/22 1429  SpO2 100 % 08/02/22 1429  Vitals shown include unvalidated device data.  Last Pain:  Vitals:   08/02/22 1206  TempSrc:   PainSc: 0-No pain      Patients Stated Pain Goal: 3 ([id number])  Complications: No notable events documented.

## 2022-08-02 NOTE — H&P (Signed)
CC/HPI: 09/25/17  Patient presents today after undergoing MRI of the prostate. This revealed a 110 g prostate with no evidence of high-grade lesions. Patient is voiding well without complaint. He denies any further gross hematuria and his hematuria workup this past visit was normal. PSA has however continued to increase some and is 13.6 on 09/19/2017. This is an upward trend.   05/05/2019  Patient underwent a prostate biopsy. Prostate size was 137 g. He had a TUR defect from his prior TURP. He has minimal voiding complaints. He does have a history of umbilical hernia repair with mesh. He has erectile dysfunction for which he has tried injections and oral medications and this did not work. Unfortunately, biopsy results revealed up stage in prostate cancer. Maximum Gleason score was 4+3. It was positive in 5/12 cores   06/01/2019  Patient unfortunately has not met with Dr. Tammi Klippel yet. However he did undergo a CT scan that revealed borderline enlarged lymph node that was stable from prior exam suggesting a benign etiology. Prostate size was 120 g. Bone scan was negative. He is leaning towards external beam radiation therapy.   07/01/2019  Patient met with Dr. Tammi Klippel. He elected to undergo external beam radiation in combination with 2 years of androgen deprivation therapy.   08/02/2019: He received Firmagon at last OV, scheduled for Eligard today. He is also scheduled to have SpaceOAR and gold seeds implanted next month with Dr Gloriann Loan in anticipation of beginning external beam radiation later this Spring. Pt endorses only having some abdominal burning at his injection site after last month's injection that resolved. He's having some mild intermittent hot flashes after ADT initiation but is tolerating them. Denies excessive fatigue. Weight and energy levels remain stable. No interval changes in baseline voiding symptoms. IPSS remains 10. Denies any interval burning or painful urination, visible blood in the  head. No interval fevers or chills, infection treatment.   His prostate cancer was diagnosed 02/27/2017. He does have the pathology report from his biopsy. His cancer was diagnosed by Dr. Gloriann Loan. His PSA at his time of diagnosis was 6.88. His most recent PSA is 14.2.   He has not undergone surgery for treatment. He has not undergone External Beam Radiation Therapy for treatment. He has undergone Hormonal Therapy for treatment.   He does not have urinary incontinence. He has not recently had unwanted weight loss. He is not having pain in new locations.   08/10/2020  Patient is due for 3 month Eligard today. Overall he is doing well. PSA is 0.05. Testosterone castrate. He stopped the Flomax and finasteride. He noticed no change in symptoms after he stopped it. He does continue to have nocturia x3 which is bothersome to him.   09/04/2020: Patient has history of prostate cancer. please see prior notes for details. I recently saw him. However, he comes in today complaining of increasing urinary complaints since Friday. This includes dysuria, frequency, urgency, urge urinary incontinence. Symptoms are consistent with UTI. Urinalysis also consistent with UTI.   11/16/2020: UTI +at last office visit positive for E coli. He was started on Augmentin. Also recently. Started on oxybutynin 5 mg q.h.s. for management of continued/worsening bladder overactivity.   He tells me he only takes oxybutynin a couple of times per week but definitely not every night. He is unsure if this is helping. He still gets up 4-5 times per night. Continues to have intermittent urge incontinence maintained with use of a small pad. Daytime frequency with associated urgency remains every  90 to 120 minutes. No trouble starting his stream. He has been having some intermittent dysuria and intermittent passage of blood with small clot material over the last several weeks. Urinalysis today is definitely concerning for infection but no hematuria  noted. Denies interval f/c, n/v. IPSS today is 18 but PVR <2oz.   His PSA has decreased to 0.026, testosterone remains at castrate levels. Fatigue is stable with continued ADT he still has some occasional night sweats.   02/16/2021  Patient continues to have dysuria and some leakage. Has trouble holding at times. He has had 2 UTIs since last visit and urinary frequency. PSA is undetectable. Testosterone castrate. He is due for Eligard today. This should be his last injection.   04/19/2021  Urine culture was positive again for E. coli at the last visit. He had some hematuria yesterday. Occasional dysuria but he seems to be feeling better on nightly Keflex UTI prophylaxis. No bacteria on urinalysis today but some persistent blood. CT IVP revealed no evidence of genitourinary abnormality. No stones. There was some indication on the bladder from the prostate. He presents today for cystoscopy to rule out any anatomical problems that may be contributing to his recurrent UTIs.   08/16/2021  PSA remains undetectable. Testosterone remains castrate. He completed hyperbaric oxygen and his voiding complaints have significantly improved. No longer having dysuria or hematuria. Very pleased with the results in this regard. Would like to try coming off the oxybutynin as well as Keflex. No interval urinary tract infections.   12/21/2021  Patient comes in today with complaints of gross hematuria. Cystoscopy back in November was negative for malignancy and showed the following in the prostate:   Obstructing. Enlarged median lobe. Severe hyperplasia. Inflammation and edema at the bladder neck/prostate consistent with radiation cystitis   Bladder mucosa was normal.   He is due for updated labs in September and is scheduled for that. He has a history of recurrent E. coli infections resistant to fluoroquinolones, tetracyclines, Bactrim. He is not on Keflex prophylaxis. As noted above, he has had hyperbaric oxygen that he  completed earlier this year. PVR 0. He is passing the occasional clot. Hematuria has been ongoing for couple of weeks. Denies any dysuria.   01/15/2022  Patient is now status post cystoscopy with clot evacuation and bilateral retrograde pyelograms. Retrograde pyelograms were normal. He had a large intravesical median lobe. Lateral lobes were wide open. Given his recurrent hematuria despite hyperbaric oxygen and recurrent UTIs, I elected to proceed with a limited TURP of the median lobe. Try not to resect a large amount given his history of radiation to try to limit the potential for complications. He does already have some incontinence. Pathology showed benign prostatic tissue with atrophic glands with radiation atypia and chronic inflammation. No malignancy identified.   02/25/2022:  Patient with above-mentioned history follows up today s/p median lobe TURP revision 01/01/22. He has concerns for worsening urge incontinence since procedure, and asks if this is his new baseline s/p procedures. He states his incontinence is worsened from pre-op baseline, with 3-4 pad changes/day, fairly wet. IPSS 16, QOL 3, dominantly urge, with frequency and nocturia x3 as well. He endorses dysuria, frequency, nocturia, weak FOS and incomplete voiding. He denies gross hematuria, straining, fevers/chill, flank pain. He continues on Keflex nightly. PSA undetectable 02/18/22, T 20.9. Today, PVR 14m.   05/30/2022  At the last visit, patient had an increase in urgency, frequency and incontinence. Urine culture positive for Pseudomonas. Treated. Symptoms have improved.  Still having a little bit of cloudy urine. Some microscopic hematuria on urinalysis today. No gross hematuria. No dysuria. He continues to have significant urgency and frequency but no longer having any incontinence. Also nocturia x 3. Tried oxybutynin in the past with some improvement but got constipated.   07/19/2022:  Patient presents acutely with concerns of  gross hematuria and passage of small clots. He continues on [place] Patient has a history of recurrent gross hematuria status post EBRT for prostate cancer, treated last year with hyperbaric oxygen, and limited TURP of median lobe. Yesterday, patient endorses sudden onset of gross hematuria along with a burning sensation about halfway through his penis. Today, he endorses passage of small clots. He denies suprapubic discomfort, flank pain, fever/chills, nausea/vomiting.   07/24/2022  Patient continues to have gross hematuria with intermittent passage of clots. Denies any significant dysuria. PVR is 34 cc.   07/31/2022: Patient scheduled for cystoscopy with possible TURBT and bilateral retrograde pyelograms with Dr. Gloriann Loan on 2/23. He was seen in the emergency department earlier today/this morning with concerns of catheter not draining. Review of ED notes indicate catheter was easily irrigated which provided some relief to the patient. Since leaving the hospital approximately 3 hours ago catheter again has stopped draining with recurrence of severe/painful urgency. He just received a text message from the pharmacy regarding Detrol which was prescribed earlier this week for bladder spasms. He continues on Augmentin through time of surgery later this week.     ALLERGIES: Aspirin Low Dose TABS    MEDICATIONS: Detrol La 4 mg capsule, ext release 24 hr 1 capsule PO Daily  Gemtesa 75 mg tablet 1 tablet PO Daily  Alprazolam 1 mg tablet Oral  Amoxicillin-Clavulanate Potass 875 mg-125 mg tablet 1 tablet PO BID  Lisinopril-Hydrochlorothiazide 10 mg-12.5 mg tablet Oral  Meloxicam 15 mg tablet  One Daily  Rosuvastatin Calcium 10 mg tablet  Vitamin D3 1000 UNIT Oral Capsule Oral     GU PSH: Cystoscopy - 04/19/2021, 2020, 2019 Cystoscopy TURP - 01/02/2022 Locm 300-'399Mg'$ /Ml Iodine,1Ml - 02/26/2021, 2020, 2020, 2019 PLACE RT DEVICE/MARKER, PROS - 2021 Prostate Needle Biopsy - 2020, 2018 Transperineal Plmt  Biodegradable Matrl 1/Mlt Njx - 2021       PSH Notes: Knee Surgery, Ankle Surgery   NON-GU PSH: Knee replacement, Right Surgical Pathology, Gross And Microscopic Examination For Prostate Needle - 2020, 2018     GU PMH: Gross hematuria - 07/24/2022, - 07/19/2022, - 01/15/2022 (Stable), - 12/31/2021, - 12/21/2021, - 04/19/2021, - 2019 Prostate Cancer - 07/24/2022, - 02/25/2022, - 01/15/2022, - 12/21/2021, - 08/16/2021, - 04/19/2021, - 11/16/2020, - 08/10/2020, - 05/08/2020, - 04/03/2020 (Stable), - 2021, - 2021, - 2020, - 2020, - 2020, - 2019, - 2019, - 2019, - 2018 Radiation cystitis (w/o hematuria) - 07/24/2022, - 01/15/2022, - 12/21/2021, - 08/16/2021, - 04/19/2021, - 02/21/2020 Chronic cystitis (w/o hematuria) - 05/30/2022, - 02/25/2022, - 12/21/2021, - 04/19/2021, - 02/26/2021, - 02/16/2021 Microscopic hematuria - 05/30/2022, - 02/25/2022, - 02/16/2021 Nocturia - 05/30/2022, - 02/25/2022 (Stable), - 02/16/2021, - 08/10/2020, Nocturia, - 2016 Urinary Frequency - 05/30/2022, (Stable), - 02/16/2021 (Stable), - 09/04/2020, - 2021 Urinary Urgency - 05/30/2022, (Stable), - 02/16/2021, - 09/04/2020 BPH w/LUTS - 02/25/2022, Benign localized hyperplasia of prostate with urinary obstruction and lower urinary tract symptoms, - 2016 Urinary Retention - 12/31/2021 Dysuria - 04/19/2021, (Stable), - 02/16/2021, - 09/04/2020 Acute Cystitis/UTI - 02/16/2021, - 11/16/2020, - 09/04/2020 Urge incontinence (Stable) - 02/16/2021, - 09/04/2020 BPH w/o LUTS - 2019 Elevated PSA -  2019, - 2018, - 2018, - 2017, - 2017, Elevated prostate specific antigen (PSA), - 2016 ED due to arterial insufficiency - 2017, - 2017, Erectile dysfunction due to arterial insufficiency, - 2014 HGPIN, PIN (prostatic intraepithelial neoplasia) - 2015 Primary hypogonadism, Hypogonadism, testicular - 2014 Prostate nodule w/o LUTS, Nodular prostate without lower urinary tract symptoms - 2014 Renal calculus, Nephrolithiasis - 2014      PMH Notes:  2006-07-02 12:17:16 - Note: Arthritis    NON-GU PMH: Radiation proctitis - 05/08/2020, - 04/03/2020 Encounter for general adult medical examination without abnormal findings, Encounter for preventive health examination - 2016 Obstructive sleep apnea (adult) (pediatric), Obstructive sleep apnea, adult - 2015 Personal history of other diseases of the circulatory system, History of hypertension - 2014 Personal history of other endocrine, nutritional and metabolic disease, History of hypercholesterolemia - 2014 Vitamin D deficiency, unspecified, Vitamin D deficiency - 2014    FAMILY HISTORY: Family Health Status Number - Runs In Family Prostate Cancer - Father   SOCIAL HISTORY: Marital Status: Married Preferred Language: English; Race: Black or African American Current Smoking Status: Patient has never smoked.  Has never drank.  Drinks 1 caffeinated drink per day. Patient's occupation is/was Retired.    REVIEW OF SYSTEMS:    GU Review Male:   Patient reports hard to postpone urination and burning/ pain with urination. Patient denies frequent urination, get up at night to urinate, leakage of urine, stream starts and stops, trouble starting your stream, have to strain to urinate , erection problems, and penile pain.  Gastrointestinal (Upper):   Patient denies nausea, vomiting, and indigestion/ heartburn.  Gastrointestinal (Lower):   Patient denies diarrhea and constipation.  Constitutional:   Patient denies fever, night sweats, weight loss, and fatigue.  Skin:   Patient denies skin rash/ lesion and itching.  Eyes:   Patient denies blurred vision and double vision.  Ears/ Nose/ Throat:   Patient denies sore throat and sinus problems.  Hematologic/Lymphatic:   Patient denies easy bruising and swollen glands.  Cardiovascular:   Patient denies leg swelling and chest pains.  Respiratory:   Patient denies cough and shortness of breath.  Endocrine:   Patient denies excessive thirst.  Musculoskeletal:   Patient denies back pain and  joint pain.  Neurological:   Patient denies headaches and dizziness.  Psychologic:   Patient denies depression and anxiety.   VITAL SIGNS:      07/31/2022 09:47 AM  BP 151/78 mmHg  Pulse 103 /min  Temperature 96.9 F / 36.0 C   GU PHYSICAL EXAMINATION:      Notes: No urine output noted in catheter collection bag   MULTI-SYSTEM PHYSICAL EXAMINATION:    Constitutional: Well-nourished. No physical deformities. Normally developed. Good grooming. Patient appears uncomfortable, pacing the room and clutching his groin.  Neck: Neck symmetrical, not swollen. Normal tracheal position.  Respiratory: No labored breathing, no use of accessory muscles.   Skin: No paleness, no jaundice, no cyanosis. No lesion, no ulcer, no rash.  Neurologic / Psychiatric: Oriented to time, oriented to place, oriented to person. No depression, no anxiety, no agitation.  Musculoskeletal: Normal gait and station of head and neck.     Complexity of Data:  Source Of History:  Patient, Medical Record Summary  Lab Test Review:   PSA, BMP, CBC with Diff  Records Review:   Pathology Reports, Previous Doctor Records, Previous Hospital Records, Previous Patient Records  Urine Test Review:   Urinalysis, Urine Culture   02/18/22 08/10/21 02/09/21 11/07/20 08/03/20 05/01/20  01/20/20 10/22/19  PSA  Total PSA <0.015 ng/mL <0.015 ng/mL <0.015 ng/mL 0.026 ng/mL 0.050 ng/mL 0.021 ng/mL 0.042 ng/mL 0.84 ng/mL    02/18/22 08/10/21 02/09/21 11/07/20 08/03/20 05/01/20 01/20/20 10/22/19  Hormones  Testosterone, Total 20.9 ng/dL 11.2 ng/dL 13.4 ng/dL 12.6 ng/dL 13.6 ng/dL 14.3 ng/dL 13.4 ng/dL 12.5 ng/dL    PROCEDURES:          Cath Irrigation - 51700 Catheter easily irrigated without significant resistance or aspiration of clot/tissue material. With gentle pressure via [place] syringe, catheter began spontaneously draining breathing significant relief to the patient. Clear-light pink return noted.   ASSESSMENT:      ICD-10 Details   2 GU:   Urinary Urgency - 0000000 Acute, Complicated Injury  1 NON-GU:   Other mechanical complication of other urinary catheter, subsequent encounter - [id number] Acute, Complicated Injury   PLAN:            Medications New Meds: Hydrocodone-Acetaminophen 5 mg-325 mg tablet 1 tablet PO Q 6 H PRN   #10  0 Refill(s)  Pharmacy Name:  [id number] Address:  [address]  [place] [address] Phone:  [phone] Fax:  [phone]           Schedule Return Visit/Planned Activity: Keep Scheduled Appointment - Schedule Surgery, Follow up MD          Document Letter(s):  Created for Patient: Clinical Summary         Notes:   Catheter easily irrigated without any significant aspiration of clot/tissue material. Quickly drained bringing significant relief to the patient.   He will pick up Detrol after leaving clinic this morning and begin. Hopefully this will down regulate some of his continued bladder spasms. I also sent some pain medication for him to use sparingly for severe exacerbations of pain. Proper catheter care communicated in an effort to prevent recurrence of catheter obstruction for his upcoming surgery on Friday. Liberal hydration strongly encouraged. He can return to the office or ED prior to then in the event he has catheter obstruction symptoms before then.        Next Appointment:      Next Appointment: 08/02/2022 01:45 PM    Appointment Type: Surgery     Location: [facility] [phone]    Provider: ELink Snuffer III, M.D.    Reason for Visit: OP WL CYSTO POSS TURBT BIL RGP      Signed by LJiles Crocker NP on 07/31/22 at 10:36 AM (EST

## 2022-08-02 NOTE — Anesthesia Preprocedure Evaluation (Signed)
Anesthesia Evaluation  Patient identified by MRN, date of birth, ID band Patient awake    Reviewed: Allergy & Precautions, H&P , NPO status , Patient's Chart, lab work & pertinent test results  Airway Mallampati: II  TM Distance: >3 FB Neck ROM: Full    Dental no notable dental hx.    Pulmonary neg pulmonary ROS   Pulmonary exam normal breath sounds clear to auscultation       Cardiovascular hypertension, Normal cardiovascular exam Rhythm:Regular Rate:Normal     Neuro/Psych negative neurological ROS  negative psych ROS   GI/Hepatic negative GI ROS, Neg liver ROS,,,  Endo/Other  obesity  Renal/GU negative Renal ROS  negative genitourinary   Musculoskeletal negative musculoskeletal ROS (+)    Abdominal   Peds negative pediatric ROS (+)  Hematology  (+) Blood dyscrasia, anemia   Anesthesia Other Findings   Reproductive/Obstetrics negative OB ROS                             Anesthesia Physical Anesthesia Plan  ASA: 3  Anesthesia Plan: General   Post-op Pain Management: Minimal or no pain anticipated   Induction: Intravenous  PONV Risk Score and Plan: 2 and Ondansetron, Dexamethasone and Treatment may vary due to age or medical condition  Airway Management Planned: Oral ETT  Additional Equipment:   Intra-op Plan:   Post-operative Plan: Extubation in OR  Informed Consent: I have reviewed the patients History and Physical, chart, labs and discussed the procedure including the risks, benefits and alternatives for the proposed anesthesia with the patient or authorized representative who has indicated his/her understanding and acceptance.     Dental advisory given  Plan Discussed with: CRNA and Surgeon  Anesthesia Plan Comments:        Anesthesia Quick Evaluation

## 2022-08-02 NOTE — Op Note (Signed)
Operative Note  Preoperative diagnosis:  1.  Gross hematuria  Postoperative diagnosis: 1.  Bladder neck tumor--medium, likely radiation cystitis  Procedure(s): 1.  Cystoscopy with bilateral retrograde pyelogram 2.  Transurethral resection of bladder tumor--medium  Surgeon: Link Snuffer, MD  Assistants: None  Anesthesia: General  Complications: None immediate  EBL: Normal  Specimens: 1.  Bladder neck resection  Drains/Catheters: 1.  20 French coud catheter  Intraoperative findings: 1.  Normal anterior urethra 2.  Edematous and hypervascular prostate.  He had what appeared to be a false passage and wide open prostatic fossa possibly representing balloon being blown up in the prostate.  There was bilobar hypertrophy.  Evidence of prior resection at the bladder neck.  There was hypervascularity there and irregular bladder mucosa that was resected and fulgurated.  Total area of resection was about 3 cm.  Bilateral ureteral orifices in normal position.  He had some erythema on the anterior bladder wall that was fulgurated.  No obvious papillary bladder tumors were seen.  Overall findings consistent with likely radiation cystitis but pathology was sent.  Bilateral retrograde pyelogram were normal without any filling defect or hydronephrosis.  Indication: 73 year old male with gross hematuria.  He has a history of prostate cancer status post radiation.  He has a history of radiation cystitis that has required TUR of the median lobe in the past.  He also underwent hyperbaric oxygen.  He has had a recurrence of bleeding and presents for the previously mentioned operation.  Description of procedure:  The patient was identified and consent was obtained.  The patient was taken to the operating room and placed in the supine position.  The patient was placed under general anesthesia.  Perioperative antibiotics were administered.  The patient was placed in dorsal lithotomy.  Patient was prepped and  draped in a standard sterile fashion and a timeout was performed.  A 21 French rigid cystoscope was advanced into the urethra and into the bladder.  Complete cystoscopy was performed with findings noted above.  The right ureter was cannulated with an open-ended ureteral catheter and a retrograde pyelogram was performed with no abnormal findings.  Same was performed on the left again with no abnormal findings.  I withdrew the scope and exchanged for a 47 French resectoscope with a visual obturator in place and advanced that into the bladder.  I exchanged for the bipolar working element and resected the abnormal area at the bladder neck.  I fulgurated some areas of hypervascularity within the prostate.  Anything actively bleeding fulgurated.  Fulguration was well away from the ureteral orifices.  He did also have some hypervascularity consistent with radiation cystitis on the anterior bladder wall towards the dome that was fulgurated.  Once all areas were treated, I turn off the water and there was no active bleeding.  I collected the bladder neck chips for specimen.  I withdrew the scope and placed a 20 [place] coud catheter.  This concluded the operation.  Patient tolerated the procedure well was stable postoperatively.  Plan: Follow-up in a few days for pathology review and voiding trial.

## 2022-08-02 NOTE — Anesthesia Procedure Notes (Signed)
Procedure Name: Intubation Date/Time: 08/02/2022 1:36 PM  Performed by: Lind Covert, CRNAPre-anesthesia Checklist: Patient identified, Emergency Drugs available, Suction available, Patient being monitored and Timeout performed Patient Re-evaluated:Patient Re-evaluated prior to induction Oxygen Delivery Method: Circle system utilized Preoxygenation: Pre-oxygenation with 100% oxygen Induction Type: IV induction Ventilation: Mask ventilation without difficulty Laryngoscope Size: Mac and 4 Grade View: Grade II Tube type: Oral Tube size: 7.5 mm Number of attempts: 1 Airway Equipment and Method: Stylet Placement Confirmation: ETT inserted through vocal cords under direct vision, positive ETCO2 and breath sounds checked- equal and bilateral Secured at: 23 cm Tube secured with: Tape Dental Injury: Teeth and Oropharynx as per pre-operative assessment

## 2022-08-02 NOTE — Anesthesia Postprocedure Evaluation (Signed)
Anesthesia Post Note  Patient: [name] [name]  Procedure(s) Performed: TRANSURETHRAL RESECTION OF BLADDER TUMOR (TURBT) CYSTOSCOPY WITH BILATERAL RETROGRADE PYELOGRAM (Bilateral)     Patient location during evaluation: PACU Anesthesia Type: General Level of consciousness: awake and alert Pain management: pain level controlled Vital Signs Assessment: post-procedure vital signs reviewed and stable Respiratory status: spontaneous breathing, nonlabored ventilation, respiratory function stable and patient connected to nasal cannula oxygen Cardiovascular status: blood pressure returned to baseline and stable Postop Assessment: no apparent nausea or vomiting Anesthetic complications: no  No notable events documented.  Last Vitals:  Vitals:   08/02/22 1530 08/02/22 1532  BP: 126/71 126/71  Pulse: 61 62  Resp:    Temp:    SpO2: 97% 96%    Last Pain:  Vitals:   08/02/22 1500  TempSrc:   PainSc: 3                  Aloni Chuang S

## 2022-08-03 ENCOUNTER — Encounter (HOSPITAL_COMMUNITY): Payer: Self-pay | Admitting: Urology

## 2022-08-05 LAB — SURGICAL PATHOLOGY

## 2022-08-06 ENCOUNTER — Telehealth: Payer: Self-pay

## 2022-08-06 NOTE — Telephone Encounter (Signed)
        Patient  visited [facility] on 2/21  Telephone encounter attempt :  1st  A HIPAA compliant voice message was left requesting a return call.  Instructed patient to call back    [facility] [phone] [address], [place], [place] [address] Phone: [phone] Email: [email or web address]

## 2022-08-07 ENCOUNTER — Telehealth: Payer: Self-pay

## 2022-08-07 NOTE — Telephone Encounter (Signed)
     Patient  visit on 2/21  at [facility] you been able to follow up with your primary care physician? Yes   The patient was or was not able to obtain any needed medicine or equipment. Yes   Are there diet recommendations that you are having difficulty following? Na   Patient expresses understanding of discharge instructions and education provided has no other needs at this time.  Yes      [facility] [phone] [address], [place], [place] [address] Phone: [phone] Email: [email or web address]

## 2022-08-12 DIAGNOSIS — N3040 Irradiation cystitis without hematuria: Secondary | ICD-10-CM | POA: Diagnosis not present

## 2022-08-12 DIAGNOSIS — R310 Gross hematuria: Secondary | ICD-10-CM | POA: Diagnosis not present

## 2022-08-18 ENCOUNTER — Other Ambulatory Visit: Payer: Self-pay | Admitting: Emergency Medicine

## 2022-08-18 DIAGNOSIS — E785 Hyperlipidemia, unspecified: Secondary | ICD-10-CM

## 2022-08-22 DIAGNOSIS — M1711 Unilateral primary osteoarthritis, right knee: Secondary | ICD-10-CM | POA: Diagnosis not present

## 2022-09-02 DIAGNOSIS — R338 Other retention of urine: Secondary | ICD-10-CM | POA: Diagnosis not present

## 2022-09-02 DIAGNOSIS — N3040 Irradiation cystitis without hematuria: Secondary | ICD-10-CM | POA: Diagnosis not present

## 2022-09-16 DIAGNOSIS — N3000 Acute cystitis without hematuria: Secondary | ICD-10-CM | POA: Diagnosis not present

## 2022-09-16 DIAGNOSIS — R338 Other retention of urine: Secondary | ICD-10-CM | POA: Diagnosis not present

## 2022-09-30 DIAGNOSIS — R8279 Other abnormal findings on microbiological examination of urine: Secondary | ICD-10-CM | POA: Diagnosis not present

## 2022-09-30 DIAGNOSIS — N3000 Acute cystitis without hematuria: Secondary | ICD-10-CM | POA: Diagnosis not present

## 2022-09-30 DIAGNOSIS — R338 Other retention of urine: Secondary | ICD-10-CM | POA: Diagnosis not present

## 2022-10-04 ENCOUNTER — Encounter (HOSPITAL_COMMUNITY): Payer: Self-pay

## 2022-10-04 ENCOUNTER — Emergency Department (HOSPITAL_COMMUNITY)
Admission: EM | Admit: 2022-10-04 | Discharge: 2022-10-04 | Disposition: A | Discharge status: Home / Self Care | Payer: Medicare Other | Attending: Emergency Medicine | Admitting: Emergency Medicine

## 2022-10-04 DIAGNOSIS — R339 Retention of urine, unspecified: Secondary | ICD-10-CM | POA: Insufficient documentation

## 2022-10-04 LAB — URINALYSIS, ROUTINE W REFLEX MICROSCOPIC
Bilirubin Urine: NEGATIVE
Glucose, UA: NEGATIVE mg/dL
Ketones, ur: NEGATIVE mg/dL
Nitrite: NEGATIVE
Protein, ur: NEGATIVE mg/dL
Specific Gravity, Urine: 1.008 (ref 1.005–1.030)
WBC, UA: >50 WBC/hpf (ref 0–5)
pH: 5 (ref 5.0–8.0)

## 2022-10-04 MED ORDER — LIDOCAINE HCL URETHRAL/MUCOSAL 2 % EX GEL
1.0000 | Freq: Once | CUTANEOUS | Status: AC
  Administered 2022-10-04: 1 via URETHRAL
  Filled 2022-10-04: qty 11

## 2022-10-04 NOTE — ED Triage Notes (Signed)
Pt arrived POV for urinary retention that is recurrent, pt did have foley in Feb that was removed a few weeks after. Pt has hx of bladder tumor and BPH. Last urination was around 10 pm last night, but not able to fully empty his bladder. Pt is obvious discomfort, VSS, A&O x4

## 2022-10-04 NOTE — ED Provider Notes (Signed)
[facility] EMERGENCY DEPARTMENT AT [facility] Provider Note   CSN: [id number] Arrival date & time: 10/04/22  0413     History  Chief Complaint  Patient presents with   Urinary Retention    [name] [name] is a 72 y.o. male.  72 yo M with a chief complaints of inability to urinate.  This is a recurrent problem for him.  He has had a Foley off and on.  Was last able to fully urinate about 10 PM.  Having some severe suprapubic pain.  Is having some leakage of urine.  Denies fevers and is flank pain.  Denied dysuria.        Home Medications Prior to Admission medications   Medication Sig Start Date End Date Taking? Authorizing Provider  acetaminophen (TYLENOL) 500 MG tablet Take 2 tablets (1,000 mg total) by mouth every 6 (six) hours as needed. 03/15/22 03/15/23  Chadwell, Ivin Booty, PA-C  ALPRAZolam (XANAX) 0.5 MG tablet TAKE 1 TABLET BY MOUTH EVERY DAY AS NEEDED FOR ANXIETY Patient taking differently: Take 0.5 mg by mouth at bedtime as needed for sleep. 12/04/21   Georgina Quint, MD  amoxicillin-clavulanate (AUGMENTIN) 875-125 MG tablet Take 1 tablet by mouth 2 (two) times daily. 07/26/22   [provider]  lisinopril-hydrochlorothiazide (ZESTORETIC) 20-12.5 MG tablet TAKE 1 TABLET BY MOUTH EVERY DAY Patient taking differently: Take 1 tablet by mouth daily. 11/25/21   Georgina Quint, MD  oxyCODONE (OXY IR/ROXICODONE) 5 MG immediate release tablet Take one tab po q4-6hrs prn pain 03/15/22   Chadwell, Ivin Booty, PA-C  rosuvastatin (CRESTOR) 10 MG tablet TAKE 1 TABLET BY MOUTH EVERY DAY 08/18/22   Georgina Quint, MD  sulfamethoxazole-trimethoprim (BACTRIM DS) 800-160 MG tablet Take 1 tablet by mouth 2 (two) times daily. 03/16/22   Chadwell, Ivin Booty, PA-C  tiZANidine (ZANAFLEX) 4 MG tablet Take 1 tablet (4 mg total) by mouth 3 (three) times daily. 03/15/22 03/15/23  Chadwell, Ivin Booty, PA-C      Allergies    Aspirin, Statins, and Gadolinium derivatives     Review of Systems   Review of Systems  Physical Exam Updated Vital Signs BP 138/89 (BP Location: Left Arm)   Pulse 64   Temp 97.7 F (36.5 C) (Oral)   Resp 20   Ht 6\' 2"  (1.88 m)   Wt 130.2 kg   SpO2 99%   BMI 36.85 kg/m  Physical Exam Vitals and nursing note reviewed.  Constitutional:      Appearance: He is well-developed.  HENT:     Head: Normocephalic and atraumatic.  Eyes:     Pupils: Pupils are equal, round, and reactive to light.  Neck:     Vascular: No JVD.  Cardiovascular:     Rate and Rhythm: Normal rate and regular rhythm.     Heart sounds: No murmur heard.    No friction rub. No gallop.  Pulmonary:     Effort: No respiratory distress.     Breath sounds: No wheezing.  Abdominal:     General: There is no distension.     Tenderness: There is no abdominal tenderness. There is no guarding or rebound.  Musculoskeletal:        General: Normal range of motion.     Cervical back: Normal range of motion and neck supple.  Skin:    Coloration: Skin is not pale.     Findings: No rash.  Neurological:     Mental Status: He is alert and oriented to person, place, and  time.  Psychiatric:        Behavior: Behavior normal.     ED Results / Procedures / Treatments   Labs (all labs ordered are listed, but only abnormal results are displayed) Labs Reviewed  URINALYSIS, ROUTINE W REFLEX MICROSCOPIC - Abnormal; Notable for the following components:      Result Value   Color, Urine STRAW (*)    Hgb urine dipstick MODERATE (*)    Leukocytes,Ua LARGE (*)    Bacteria, UA RARE (*)    All other components within normal limits    EKG None  Radiology No results found.  Procedures Procedures    Medications Ordered in ED Medications  lidocaine (XYLOCAINE) 2 % jelly 1 Application (1 Application Urethral Given 10/04/22 0500)    ED Course/ Medical Decision Making/ A&P                             Medical Decision Making Amount and/or Complexity of Data  Reviewed Labs: ordered.   72 yo M with a chief complaints of inability to urinate.  Started about 6 hours ago.  Will place a Foley.  Foley catheter was placed and the patient feels much better.  Has about 600 cc urine at bedside.  UA is negative for infection on my independent interpretation.  Will discharge home.  Urology follow-up.  5:22 AM:  I have discussed the diagnosis/risks/treatment options with the patient.  Evaluation and diagnostic testing in the emergency department does not suggest an emergent condition requiring admission or immediate intervention beyond what has been performed at this time.  They will follow up with PCP. We also discussed returning to the ED immediately if new or worsening sx occur. We discussed the sx which are most concerning (e.g., sudden worsening pain, fever, inability to tolerate by mouth) that necessitate immediate return. Medications administered to the patient during their visit and any new prescriptions provided to the patient are listed below.  Medications given during this visit Medications  lidocaine (XYLOCAINE) 2 % jelly 1 Application (1 Application Urethral Given 10/04/22 0500)     The patient appears reasonably screen and/or stabilized for discharge and I doubt any other medical condition or other [facility] requiring further screening, evaluation, or treatment in the ED at this time prior to discharge.          Final Clinical Impression(s) / ED Diagnoses Final diagnoses:  Urinary retention    Rx / DC Orders ED Discharge Orders     None         Melene Plan, DO 10/04/22 0522

## 2022-10-04 NOTE — Discharge Instructions (Signed)
Follow-up with urologist in the office

## 2022-10-11 ENCOUNTER — Telehealth: Payer: Self-pay

## 2022-10-11 NOTE — Telephone Encounter (Signed)
Transition Care Management Unsuccessful Follow-up Telephone Call  Date of discharge and from where:  4/26 [facility]   Attempts:  1st Attempt  Reason for unsuccessful TCM follow-up call:  Left voice message   Lenard Forth [facility] Guide, [facility] [phone] [address] [place], [place], [place] [address] Phone: [phone] Email: [email or web address]

## 2022-10-14 ENCOUNTER — Telehealth: Payer: Self-pay

## 2022-10-14 NOTE — Telephone Encounter (Signed)
Transition Care Management Unsuccessful Follow-up Telephone Call  Date of discharge and from where:  4/26 [facility]  Attempts:  2nd Attempt  Reason for unsuccessful TCM follow-up call:  Left voice message   Lenard Forth [facility] Guide, [facility] [phone] [address] [place], [place], [place] [address] Phone: [phone] Email: [email or web address]

## 2022-10-15 DIAGNOSIS — R338 Other retention of urine: Secondary | ICD-10-CM | POA: Diagnosis not present

## 2022-10-15 DIAGNOSIS — N3000 Acute cystitis without hematuria: Secondary | ICD-10-CM | POA: Diagnosis not present

## 2022-10-24 DIAGNOSIS — R8271 Bacteriuria: Secondary | ICD-10-CM | POA: Diagnosis not present

## 2022-10-24 DIAGNOSIS — R3915 Urgency of urination: Secondary | ICD-10-CM | POA: Diagnosis not present

## 2022-10-24 DIAGNOSIS — N3020 Other chronic cystitis without hematuria: Secondary | ICD-10-CM | POA: Diagnosis not present

## 2022-10-30 ENCOUNTER — Encounter: Payer: Self-pay | Admitting: Emergency Medicine

## 2022-10-30 ENCOUNTER — Ambulatory Visit (INDEPENDENT_AMBULATORY_CARE_PROVIDER_SITE_OTHER): Payer: Medicare Other | Admitting: Emergency Medicine

## 2022-10-30 VITALS — BP 130/80 | HR 71 | Temp 97.9°F | Ht 74.0 in | Wt 299.2 lb

## 2022-10-30 DIAGNOSIS — E785 Hyperlipidemia, unspecified: Secondary | ICD-10-CM | POA: Diagnosis not present

## 2022-10-30 DIAGNOSIS — Z6838 Body mass index (BMI) 38.0-38.9, adult: Secondary | ICD-10-CM | POA: Diagnosis not present

## 2022-10-30 DIAGNOSIS — K5790 Diverticulosis of intestine, part unspecified, without perforation or abscess without bleeding: Secondary | ICD-10-CM | POA: Diagnosis not present

## 2022-10-30 DIAGNOSIS — I10 Essential (primary) hypertension: Secondary | ICD-10-CM

## 2022-10-30 DIAGNOSIS — R7303 Prediabetes: Secondary | ICD-10-CM | POA: Diagnosis not present

## 2022-10-30 DIAGNOSIS — C61 Malignant neoplasm of prostate: Secondary | ICD-10-CM

## 2022-10-30 DIAGNOSIS — N400 Enlarged prostate without lower urinary tract symptoms: Secondary | ICD-10-CM

## 2022-10-30 NOTE — Progress Notes (Signed)
[name] [name] 72 y.o.   Chief Complaint  Patient presents with   Medical Management of Chronic Issues    F/u appt, no concerns     HISTORY OF PRESENT ILLNESS: This is a 72 y.o. male A1A here for follow-up of chronic medical problems Since her last visit patient had TURP procedure with minor complications such as urinary retention and infection Doing better today still taking antibiotics. Occasional constipation No other complaints or medical concerns today.  HPI   Prior to Admission medications   Medication Sig Start Date End Date Taking? Authorizing Provider  acetaminophen (TYLENOL) 500 MG tablet Take 2 tablets (1,000 mg total) by mouth every 6 (six) hours as needed. 03/15/22 03/15/23  Chadwell, Ivin Booty, PA-C  ALPRAZolam (XANAX) 0.5 MG tablet TAKE 1 TABLET BY MOUTH EVERY DAY AS NEEDED FOR ANXIETY Patient taking differently: Take 0.5 mg by mouth at bedtime as needed for sleep. 12/04/21   Georgina Quint, MD  amoxicillin-clavulanate (AUGMENTIN) 875-125 MG tablet Take 1 tablet by mouth 2 (two) times daily. 07/26/22   [provider]  lisinopril-hydrochlorothiazide (ZESTORETIC) 20-12.5 MG tablet TAKE 1 TABLET BY MOUTH EVERY DAY Patient taking differently: Take 1 tablet by mouth daily. 11/25/21   Georgina Quint, MD  oxyCODONE (OXY IR/ROXICODONE) 5 MG immediate release tablet Take one tab po q4-6hrs prn pain 03/15/22   Chadwell, Ivin Booty, PA-C  rosuvastatin (CRESTOR) 10 MG tablet TAKE 1 TABLET BY MOUTH EVERY DAY 08/18/22   Georgina Quint, MD  sulfamethoxazole-trimethoprim (BACTRIM DS) 800-160 MG tablet Take 1 tablet by mouth 2 (two) times daily. 03/16/22   Chadwell, Ivin Booty, PA-C  tiZANidine (ZANAFLEX) 4 MG tablet Take 1 tablet (4 mg total) by mouth 3 (three) times daily. 03/15/22 03/15/23  Chadwell, Ivin Booty, PA-C    Allergies  Allergen Reactions   Aspirin     REACTION: upsets stomach   Statins     REACTION: UPSETS STOMACH   Gadolinium Derivatives Nausea And  Vomiting    Pt was given 20 ml multihance and became nauseated and vomited several times.     Patient Active Problem List   Diagnosis Date Noted   S/P total knee arthroplasty, right 03/15/2022   Body mass index (BMI) of 38.0-38.9 in adult 01/10/2020   Diverticulosis 01/10/2020   Prediabetes 01/10/2020   Dyslipidemia 01/10/2020   Malignant neoplasm of prostate (HCC) 06/08/2019   Prostate enlargement 08/29/2017   Gout 10/25/2014   Essential hypertension    Benign prostatic hyperplasia    Kidney stones    Prostate nodule     Past Medical History:  Diagnosis Date   Allergy    seasonal allergies   Arthritis    generalized   Blood transfusion without reported diagnosis 2013   when had diverticulitis flare   BPH (benign prostatic hypertrophy)    Colon polyp    diverticular bleed with transfusion   Diverticulosis    History of kidney stones    Hyperlipidemia    on meds   Hypertension    on meds   Kidney stones    hx of 15-20 kidney stones    Obesity, unspecified    Pre-diabetes    Prostate cancer (HCC) 2020   dx 05/2019   Prostate nodule 07/27/2008    Past Surgical History:  Procedure Laterality Date   ANKLE FUSION Left 1991   COLONOSCOPY  2011   DB-F/V-miralax(good)-TICS/3 HPP   CYSTOSCOPY W/ RETROGRADES Bilateral 08/02/2022   Procedure: CYSTOSCOPY WITH BILATERAL RETROGRADE PYELOGRAM;  Surgeon: Ray Church III,  MD;  Location: [facility];  Service: Urology;  Laterality: Bilateral;   HERNIA REPAIR N/A    umbilical    POLYPECTOMY  2011   3 HPP   TOTAL KNEE ARTHROPLASTY Right 03/15/2022   Procedure: TOTAL KNEE ARTHROPLASTY;  Surgeon: Frederico Hamman, MD;  Location: [facility];  Service: Orthopedics;  Laterality: Right;   TRANSURETHRAL RESECTION OF BLADDER TUMOR N/A 08/02/2022   Procedure: TRANSURETHRAL RESECTION OF BLADDER TUMOR (TURBT);  Surgeon: Crista Elliot, MD;  Location: [facility];  Service: Urology;  Laterality: N/A;  60 MINS   TRANSURETHRAL RESECTION OF PROSTATE   2020    Social History   Socioeconomic History   Marital status: Married    Spouse name: Not on file   Number of children: 2   Years of education: Not on file   Highest education level: Not on file  Occupational History   Not on file  Tobacco Use   Smoking status: Never   Smokeless tobacco: Never  Vaping Use   Vaping Use: Never used  Substance and Sexual Activity   Alcohol use: No   Drug use: No   Sexual activity: Yes  Other Topics Concern   Not on file  Social History Narrative   Not on file   Social Determinants of Health   Financial Resource Strain: Low Risk  (04/11/2022)   Overall Financial Resource Strain (CARDIA)    Difficulty of Paying Living Expenses: Not hard at all  Food Insecurity: No Food Insecurity (04/11/2022)   Hunger Vital Sign    Worried About Running Out of Food in the Last Year: Never true    Ran Out of Food in the Last Year: Never true  Transportation Needs: No Transportation Needs (04/11/2022)   PRAPARE - Administrator, Civil Service (Medical): No    Lack of Transportation (Non-Medical): No  Physical Activity: Insufficiently Active (04/11/2022)   Exercise Vital Sign    Days of Exercise per Week: 3 days    Minutes of Exercise per Session: 40 min  Stress: No Stress Concern Present (04/11/2022)   [employer] of Occupational Health - Occupational Stress Questionnaire    Feeling of Stress : Not at all  Social Connections: Socially Integrated (04/11/2022)   Social Connection and Isolation Panel [NHANES]    Frequency of Communication with Friends and Family: More than three times a week    Frequency of Social Gatherings with Friends and Family: More than three times a week    Attends Religious Services: More than 4 times per year    Active Member of [employer] or Organizations: Yes    Attends Engineer, structural: More than 4 times per year    Marital Status: Married  Catering manager Violence: Not At Risk (04/11/2022)    Humiliation, Afraid, Rape, and Kick questionnaire    Fear of Current or Ex-Partner: No    Emotionally Abused: No    Physically Abused: No    Sexually Abused: No    Family History  Problem Relation Age of Onset   Diabetes Mother    Breast cancer Mother 69   Prostate cancer Father        patient did not have a relationship with his father but understands he had prostate ca   Colon polyps Father 66   Colon cancer Father 30   Diabetes Sister    Pancreatic cancer Neg Hx    Esophageal cancer Neg Hx    Stomach cancer Neg Hx  Rectal cancer Neg Hx      Review of Systems  Constitutional: Negative.  Negative for chills and fever.  HENT: Negative.  Negative for congestion and sore throat.   Respiratory: Negative.  Negative for cough and shortness of breath.   Cardiovascular: Negative.  Negative for chest pain and palpitations.  Gastrointestinal:  Positive for constipation.  Genitourinary: Negative.  Negative for dysuria and hematuria.  Skin: Negative.  Negative for rash.  Neurological: Negative.  Negative for dizziness and headaches.  All other systems reviewed and are negative.   Vitals:   10/30/22 0835  BP: 130/80  Pulse: 71  Temp: 97.9 F (36.6 C)  SpO2: 99%    Physical Exam Vitals reviewed.  Constitutional:      Appearance: Normal appearance.  HENT:     Head: Normocephalic.     Mouth/Throat:     Mouth: Mucous membranes are moist.     Pharynx: Oropharynx is clear.  Eyes:     Extraocular Movements: Extraocular movements intact.     Pupils: Pupils are equal, round, and reactive to light.  Cardiovascular:     Rate and Rhythm: Normal rate and regular rhythm.     Pulses: Normal pulses.     Heart sounds: Normal heart sounds.  Pulmonary:     Effort: Pulmonary effort is normal.     Breath sounds: Normal breath sounds.  Abdominal:     Palpations: Abdomen is soft.     Tenderness: There is no abdominal tenderness.  Musculoskeletal:     Cervical back: No tenderness.   Lymphadenopathy:     Cervical: No cervical adenopathy.  Skin:    General: Skin is warm and dry.     Capillary Refill: Capillary refill takes less than 2 seconds.  Neurological:     General: No focal deficit present.     Mental Status: He is alert and oriented to person, place, and time.  Psychiatric:        Mood and Affect: Mood normal.        Behavior: Behavior normal.      ASSESSMENT & PLAN: A total of 45 minutes was spent with the patient and counseling/coordination of care regarding preparing for this visit, review of most recent office visit notes, review of multiple chronic medical conditions under management, review of all medications, review of most recent blood work results, cardiovascular risks associated with hypertension and dyslipidemia, education on nutrition, prognosis, documentation, and need for follow-up.  Problem List Items Addressed This Visit       Cardiovascular and Mediastinum   Essential hypertension - Primary    Well-controlled hypertension Continue Zestoretic 20-12.5 mg daily Cardiovascular risks associated with hypertension discussed Dietary approaches to stop hypertension discussed        Digestive   Diverticulosis    Advised to increase amount of fiber in his diet and stay well-hydrated This will also help with intermittent chronic constipation        Genitourinary   Benign prostatic hyperplasia   Malignant neoplasm of prostate (HCC)    Doing well since surgery.  Slowly recovering.        Other   Body mass index (BMI) of 38.0-38.9 in adult    Diet and patient discussed Cardiovascular risks associated with obesity discussed Advised to decrease amount of daily carbohydrate intake and daily calories and increase amount of plant-based protein diet Benefits of exercise discussed      Prediabetes    Normal recent hemoglobin A1c Lab Results  Component Value  Date   HGBA1C 5.6 07/30/2022  Diet and nutrition discussed Advised to decrease  amount of daily carbohydrate intake and daily calories and increase amount of plant based protein in his diet      Dyslipidemia    Chronic stable condition Continues rosuvastatin 10 mg daily Diet and nutrition discussed      Patient Instructions  Health Maintenance After Age 53 After age 43, you are at a higher risk for certain long-term diseases and infections as well as injuries from falls. Falls are a major cause of broken bones and head injuries in people who are older than age 1. Getting regular preventive care can help to keep you healthy and well. Preventive care includes getting regular testing and making lifestyle changes as recommended by your health care provider. Talk with your health care provider about: Which screenings and tests you should have. A screening is a test that checks for a disease when you have no symptoms. A diet and exercise plan that is right for you. What should I know about screenings and tests to prevent falls? Screening and testing are the best ways to find a health problem early. Early diagnosis and treatment give you the best chance of managing medical conditions that are common after age 1. Certain conditions and lifestyle choices may make you more likely to have a fall. Your health care provider may recommend: Regular vision checks. Poor vision and conditions such as cataracts can make you more likely to have a fall. If you wear glasses, make sure to get your prescription updated if your vision changes. Medicine review. Work with your health care provider to regularly review all of the medicines you are taking, including over-the-counter medicines. Ask your health care provider about any side effects that may make you more likely to have a fall. Tell your health care provider if any medicines that you take make you feel dizzy or sleepy. Strength and balance checks. Your health care provider may recommend certain tests to check your strength and balance while  standing, walking, or changing positions. Foot health exam. Foot pain and numbness, as well as not wearing proper footwear, can make you more likely to have a fall. Screenings, including: Osteoporosis screening. Osteoporosis is a condition that causes the bones to get weaker and break more easily. Blood pressure screening. Blood pressure changes and medicines to control blood pressure can make you feel dizzy. Depression screening. You may be more likely to have a fall if you have a fear of falling, feel depressed, or feel unable to do activities that you used to do. Alcohol use screening. Using too much alcohol can affect your balance and may make you more likely to have a fall. Follow these instructions at home: Lifestyle Do not drink alcohol if: Your health care provider tells you not to drink. If you drink alcohol: Limit how much you have to: 0-1 drink a day for women. 0-2 drinks a day for men. Know how much alcohol is in your drink. In the [place] one drink equals one 12 oz bottle of beer (355 mL), one 5 oz glass of wine (148 mL), or one 1 oz glass of hard liquor (44 mL). Do not use any products that contain nicotine or tobacco. These products include cigarettes, chewing tobacco, and vaping devices, such as e-cigarettes. If you need help quitting, ask your health care provider. Activity  Follow a regular exercise program to stay fit. This will help you maintain your balance. Ask your health care  provider what types of exercise are appropriate for you. If you need a cane or walker, use it as recommended by your health care provider. Wear supportive shoes that have nonskid soles. Safety  Remove any tripping hazards, such as rugs, cords, and clutter. Install safety equipment such as grab bars in bathrooms and safety rails on stairs. Keep rooms and walkways well-lit. General instructions Talk with your health care provider about your risks for falling. Tell your health care provider  if: You fall. Be sure to tell your health care provider about all falls, even ones that seem minor. You feel dizzy, tiredness (fatigue), or off-balance. Take over-the-counter and prescription medicines only as told by your health care provider. These include supplements. Eat a healthy diet and maintain a healthy weight. A healthy diet includes low-fat dairy products, low-fat (lean) meats, and fiber from whole grains, beans, and lots of fruits and vegetables. Stay current with your vaccines. Schedule regular health, dental, and eye exams. Summary Having a healthy lifestyle and getting preventive care can help to protect your health and wellness after age 64. Screening and testing are the best way to find a health problem early and help you avoid having a fall. Early diagnosis and treatment give you the best chance for managing medical conditions that are more common for people who are older than age 87. Falls are a major cause of broken bones and head injuries in people who are older than age 85. Take precautions to prevent a fall at home. Work with your health care provider to learn what changes you can make to improve your health and wellness and to prevent falls. This information is not intended to replace advice given to you by your health care provider. Make sure you discuss any questions you have with your health care provider. Document Revised: 10/16/2020 Document Reviewed: 10/16/2020 Elsevier Patient Education  2023 Elsevier Inc.      Edwina Barth, MD [facility]

## 2022-10-30 NOTE — Assessment & Plan Note (Signed)
Chronic stable condition Continues rosuvastatin 10 mg daily Diet and nutrition discussed

## 2022-10-30 NOTE — Assessment & Plan Note (Signed)
Diet and patient discussed Cardiovascular risks associated with obesity discussed Advised to decrease amount of daily carbohydrate intake and daily calories and increase amount of plant-based protein diet Benefits of exercise discussed

## 2022-10-30 NOTE — Assessment & Plan Note (Addendum)
Normal recent hemoglobin A1c Lab Results  Component Value Date   HGBA1C 5.6 07/30/2022  Diet and nutrition discussed Advised to decrease amount of daily carbohydrate intake and daily calories and increase amount of plant based protein in his diet

## 2022-10-30 NOTE — Assessment & Plan Note (Signed)
Doing well since surgery.  Slowly recovering.

## 2022-10-30 NOTE — Assessment & Plan Note (Signed)
Advised to increase amount of fiber in his diet and stay well-hydrated This will also help with intermittent chronic constipation

## 2022-10-30 NOTE — Assessment & Plan Note (Signed)
Well-controlled hypertension. Continue Zestoretic 20-12.5 mg daily. Cardiovascular risks associated with hypertension discussed. Dietary approaches to stop hypertension discussed. 

## 2022-10-30 NOTE — Patient Instructions (Signed)
Health Maintenance After Age 70 After age 70, you are at a higher risk for certain long-term diseases and infections as well as injuries from falls. Falls are a major cause of broken bones and head injuries in people who are older than age 70. Getting regular preventive care can help to keep you healthy and well. Preventive care includes getting regular testing and making lifestyle changes as recommended by your health care provider. Talk with your health care provider about: Which screenings and tests you should have. A screening is a test that checks for a disease when you have no symptoms. A diet and exercise plan that is right for you. What should I know about screenings and tests to prevent falls? Screening and testing are the best ways to find a health problem early. Early diagnosis and treatment give you the best chance of managing medical conditions that are common after age 70. Certain conditions and lifestyle choices may make you more likely to have a fall. Your health care provider may recommend: Regular vision checks. Poor vision and conditions such as cataracts can make you more likely to have a fall. If you wear glasses, make sure to get your prescription updated if your vision changes. Medicine review. Work with your health care provider to regularly review all of the medicines you are taking, including over-the-counter medicines. Ask your health care provider about any side effects that may make you more likely to have a fall. Tell your health care provider if any medicines that you take make you feel dizzy or sleepy. Strength and balance checks. Your health care provider may recommend certain tests to check your strength and balance while standing, walking, or changing positions. Foot health exam. Foot pain and numbness, as well as not wearing proper footwear, can make you more likely to have a fall. Screenings, including: Osteoporosis screening. Osteoporosis is a condition that causes  the bones to get weaker and break more easily. Blood pressure screening. Blood pressure changes and medicines to control blood pressure can make you feel dizzy. Depression screening. You may be more likely to have a fall if you have a fear of falling, feel depressed, or feel unable to do activities that you used to do. Alcohol use screening. Using too much alcohol can affect your balance and may make you more likely to have a fall. Follow these instructions at home: Lifestyle Do not drink alcohol if: Your health care provider tells you not to drink. If you drink alcohol: Limit how much you have to: 0-1 drink a day for women. 0-2 drinks a day for men. Know how much alcohol is in your drink. In the [place] one drink equals one 12 oz bottle of beer (355 mL), one 5 oz glass of wine (148 mL), or one 1 oz glass of hard liquor (44 mL). Do not use any products that contain nicotine or tobacco. These products include cigarettes, chewing tobacco, and vaping devices, such as e-cigarettes. If you need help quitting, ask your health care provider. Activity  Follow a regular exercise program to stay fit. This will help you maintain your balance. Ask your health care provider what types of exercise are appropriate for you. If you need a cane or walker, use it as recommended by your health care provider. Wear supportive shoes that have nonskid soles. Safety  Remove any tripping hazards, such as rugs, cords, and clutter. Install safety equipment such as grab bars in bathrooms and safety rails on stairs. Keep rooms and walkways   well-lit. General instructions Talk with your health care provider about your risks for falling. Tell your health care provider if: You fall. Be sure to tell your health care provider about all falls, even ones that seem minor. You feel dizzy, tiredness (fatigue), or off-balance. Take over-the-counter and prescription medicines only as told by your health care provider. These include  supplements. Eat a healthy diet and maintain a healthy weight. A healthy diet includes low-fat dairy products, low-fat (lean) meats, and fiber from whole grains, beans, and lots of fruits and vegetables. Stay current with your vaccines. Schedule regular health, dental, and eye exams. Summary Having a healthy lifestyle and getting preventive care can help to protect your health and wellness after age 70. Screening and testing are the best way to find a health problem early and help you avoid having a fall. Early diagnosis and treatment give you the best chance for managing medical conditions that are more common for people who are older than age 70. Falls are a major cause of broken bones and head injuries in people who are older than age 70. Take precautions to prevent a fall at home. Work with your health care provider to learn what changes you can make to improve your health and wellness and to prevent falls. This information is not intended to replace advice given to you by your health care provider. Make sure you discuss any questions you have with your health care provider. Document Revised: 10/16/2020 Document Reviewed: 10/16/2020 Elsevier Patient Education  2023 Elsevier Inc.  

## 2022-11-01 DIAGNOSIS — R338 Other retention of urine: Secondary | ICD-10-CM | POA: Diagnosis not present

## 2022-11-08 DIAGNOSIS — N3000 Acute cystitis without hematuria: Secondary | ICD-10-CM | POA: Diagnosis not present

## 2022-11-08 DIAGNOSIS — R338 Other retention of urine: Secondary | ICD-10-CM | POA: Diagnosis not present

## 2022-11-10 ENCOUNTER — Emergency Department (HOSPITAL_COMMUNITY)
Admission: EM | Admit: 2022-11-10 | Discharge: 2022-11-10 | Disposition: A | Discharge status: Home / Self Care | Payer: Medicare Other | Attending: Emergency Medicine | Admitting: Emergency Medicine

## 2022-11-10 ENCOUNTER — Encounter (HOSPITAL_COMMUNITY): Payer: Self-pay

## 2022-11-10 DIAGNOSIS — Z79899 Other long term (current) drug therapy: Secondary | ICD-10-CM | POA: Insufficient documentation

## 2022-11-10 DIAGNOSIS — R339 Retention of urine, unspecified: Secondary | ICD-10-CM

## 2022-11-10 LAB — URINALYSIS, ROUTINE W REFLEX MICROSCOPIC
Bilirubin Urine: NEGATIVE
Glucose, UA: NEGATIVE mg/dL
Ketones, ur: NEGATIVE mg/dL
Nitrite: NEGATIVE
Protein, ur: 30 mg/dL — AB
RBC / HPF: >50 RBC/hpf (ref 0–5)
Specific Gravity, Urine: 1.015 (ref 1.005–1.030)
WBC, UA: >50 WBC/hpf (ref 0–5)
pH: 5 (ref 5.0–8.0)

## 2022-11-10 NOTE — ED Provider Notes (Signed)
[place] EMERGENCY DEPARTMENT AT [facility] Provider Note   CSN: [id number] Arrival date & time: 11/10/22  0845     History  Chief Complaint  Patient presents with   Urinary Retention    [name] [name] is a 72 y.o. male.  Pt complains of urinary retention.  Pt reports he had a foley catheter removed on Friday.  Pt reports he has been nable to urinate this am.  Pt sees Dr. Alvester Morin.  Pt denies any fever or chills.  Pt does not feel like he has a uti.  Pt has had multiple recent infections.    The history is provided by the patient. No language interpreter was used.       Home Medications Prior to Admission medications   Medication Sig Start Date End Date Taking? Authorizing Provider  cefpodoxime (VANTIN) 100 MG tablet Take 100 mg by mouth 2 (two) times daily. 10/30/22  Yes [provider]  lisinopril-hydrochlorothiazide (ZESTORETIC) 20-12.5 MG tablet TAKE 1 TABLET BY MOUTH EVERY DAY Patient taking differently: Take 1 tablet by mouth daily. 11/25/21  Yes Sagardia, Eilleen Kempf, MD  meloxicam (MOBIC) 15 MG tablet Take 15 mg by mouth daily. 10/26/22  Yes [provider]  rosuvastatin (CRESTOR) 10 MG tablet TAKE 1 TABLET BY MOUTH EVERY DAY Patient taking differently: Take 10 mg by mouth daily. 08/18/22  Yes Sagardia, Eilleen Kempf, MD  acetaminophen (TYLENOL) 500 MG tablet Take 2 tablets (1,000 mg total) by mouth every 6 (six) hours as needed. Patient not taking: Reported on 11/10/2022 03/15/22 03/15/23  Chadwell, Ivin Booty, PA-C  ALPRAZolam Prudy Feeler) 0.5 MG tablet TAKE 1 TABLET BY MOUTH EVERY DAY AS NEEDED FOR ANXIETY Patient not taking: Reported on 11/10/2022 12/04/21   Georgina Quint, MD  amoxicillin-clavulanate (AUGMENTIN) 875-125 MG tablet Take 1 tablet by mouth 2 (two) times daily. Patient not taking: Reported on 11/10/2022 07/26/22   [provider]  oxyCODONE (OXY IR/ROXICODONE) 5 MG immediate release tablet Take one tab po q4-6hrs prn pain Patient not  taking: Reported on 11/10/2022 03/15/22   Chadwell, Ivin Booty, PA-C  sulfamethoxazole-trimethoprim (BACTRIM DS) 800-160 MG tablet Take 1 tablet by mouth 2 (two) times daily. Patient not taking: Reported on 11/10/2022 03/16/22   Chadwell, Ivin Booty, PA-C  tiZANidine (ZANAFLEX) 4 MG tablet Take 1 tablet (4 mg total) by mouth 3 (three) times daily. Patient not taking: Reported on 11/10/2022 03/15/22 03/15/23  Margart Sickles, PA-C      Allergies    Aspirin, Statins, and Gadolinium derivatives    Review of Systems   Review of Systems  All other systems reviewed and are negative.   Physical Exam Updated Vital Signs BP (!) 152/117 (BP Location: Right Arm)   Pulse (!) 108   Temp 97.6 F (36.4 C) (Oral)   Resp (!) 24   SpO2 98%  Physical Exam Vitals and nursing note reviewed.  Constitutional:      Appearance: He is well-developed.  HENT:     Head: Normocephalic.  Cardiovascular:     Rate and Rhythm: Normal rate.  Pulmonary:     Effort: Pulmonary effort is normal.  Abdominal:     General: There is no distension.  Musculoskeletal:        General: Normal range of motion.     Cervical back: Normal range of motion.  Skin:    General: Skin is warm.  Neurological:     General: No focal deficit present.     Mental Status: He is alert and oriented to  person, place, and time.     ED Results / Procedures / Treatments   Labs (all labs ordered are listed, but only abnormal results are displayed) Labs Reviewed  URINALYSIS, ROUTINE W REFLEX MICROSCOPIC - Abnormal; Notable for the following components:      Result Value   APPearance HAZY (*)    Hgb urine dipstick LARGE (*)    Protein, ur 30 (*)    Leukocytes,Ua MODERATE (*)    Bacteria, UA MANY (*)    All other components within normal limits    EKG None  Radiology No results found.  Procedures Procedures    Medications Ordered in ED Medications - No data to display  ED Course/ Medical Decision Making/ A&P                              Medical Decision Making Pt complains of urinary retention.  Pt reports he can not urinate   Amount and/or Complexity of Data Reviewed Labs: ordered. Decision-making details documented in ED Course.    Details: Ua obtained and reviewed, greater than 50 wbc and rbc.  Urine culture pending  Risk Risk Details: Pt reports he feels much better after catheter is replaced.  Pt advised to call Dr. Shannan Harper office tomorrow to be seen for evaltuion            Final Clinical Impression(s) / ED Diagnoses Final diagnoses:  Urinary retention    Rx / DC Orders ED Discharge Orders     None      An After Visit Summary was printed and given to the patient.    Elson Areas, [place] 11/10/22 1053    Pricilla Loveless, MD 11/12/22 [phone]

## 2022-11-10 NOTE — ED Triage Notes (Signed)
Pt arrived via POV, c/o urinary retention. Unable to pass urine since 3am this morning. Hx of same. Recently had foley removed Friday.

## 2022-11-12 LAB — URINE CULTURE
Culture: >=100000 — AB
Special Requests: NORMAL

## 2022-11-13 ENCOUNTER — Telehealth (HOSPITAL_BASED_OUTPATIENT_CLINIC_OR_DEPARTMENT_OTHER): Payer: Self-pay

## 2022-11-13 NOTE — Telephone Encounter (Signed)
Post ED Visit - Positive Culture Follow-up  Culture report reviewed by antimicrobial stewardship pharmacist: [facility] [x]  Enzo Bi, Pharm.D. []  Celedonio Miyamoto, Pharm.D., BCPS AQ-ID []  Garvin Fila, Pharm.D., BCPS []  Georgina Pillion, Pharm.D., BCPS []  [place], [address].D., BCPS, AAHIVP []  Estella Husk, Pharm.D., BCPS, AAHIVP []  Lysle Pearl, PharmD, BCPS []  Phillips Climes, PharmD, BCPS []  Agapito Games, PharmD, BCPS []  Verlan Friends, PharmD []  Mervyn Gay, PharmD, BCPS []  Vinnie Level, PharmD  [facility] []  Len Childs, PharmD []  Greer Pickerel, PharmD []  Adalberto Cole, PharmD []  Perlie Gold, Rph []  Lonell Face) Jean Rosenthal, PharmD []  Earl Many, PharmD []  Junita Push, PharmD []  Dorna Leitz, PharmD []  Terrilee Files, PharmD []  Lynann Beaver, PharmD []  Keturah Barre, PharmD []  Loralee Pacas, PharmD []  Bernadene Person, PharmD   Positive urine culture Treated with Cefpodoxime Proxetil, organism sensitive to the same and no further patient follow-up is required at this time.  Sandria Senter 11/13/2022, 10:39 AM

## 2022-11-14 ENCOUNTER — Telehealth: Payer: Self-pay

## 2022-11-14 NOTE — Telephone Encounter (Signed)
Transition Care Management Unsuccessful Follow-up Telephone Call  Date of discharge and from where:  11/10/2022 [facility]  Attempts:  1st Attempt  Reason for unsuccessful TCM follow-up call:  Left voice message  Marai Teehan [facility]  [facility] Population Health Community Resource Care Guide   [email or web address]  ?? [phone]   Website: [email or web address]  [email or web address]

## 2022-11-15 ENCOUNTER — Telehealth: Payer: Self-pay

## 2022-11-15 NOTE — Telephone Encounter (Signed)
Transition Care Management Unsuccessful Follow-up Telephone Call  Date of discharge and from where:  11/10/2022 [facility]  Attempts:  2nd Attempt  Reason for unsuccessful TCM follow-up call:  Left voice message  Braelyn Jenson [facility]  [facility] Population Health Community Resource Care Guide   [email or web address]  ?? [phone]   Website: [email or web address]  [email or web address]

## 2022-11-16 ENCOUNTER — Other Ambulatory Visit: Payer: Self-pay | Admitting: Emergency Medicine

## 2022-11-16 DIAGNOSIS — I10 Essential (primary) hypertension: Secondary | ICD-10-CM

## 2022-11-22 DIAGNOSIS — R338 Other retention of urine: Secondary | ICD-10-CM | POA: Diagnosis not present

## 2022-12-05 DIAGNOSIS — R3915 Urgency of urination: Secondary | ICD-10-CM | POA: Diagnosis not present

## 2022-12-05 DIAGNOSIS — R350 Frequency of micturition: Secondary | ICD-10-CM | POA: Diagnosis not present

## 2022-12-05 DIAGNOSIS — R8290 Unspecified abnormal findings in urine: Secondary | ICD-10-CM | POA: Diagnosis not present

## 2022-12-16 IMAGING — DX DG CHEST 2V
2 series · 2 of 2 positions shown · non-contrast
Comparison: X-ray chest 11/12/2013; NM whole body 05/31/2019.

CLINICAL DATA: Pre-hyperbaric chamber for radiation cystitis.

EXAM:
CHEST - 2 VIEW

[chest pa]
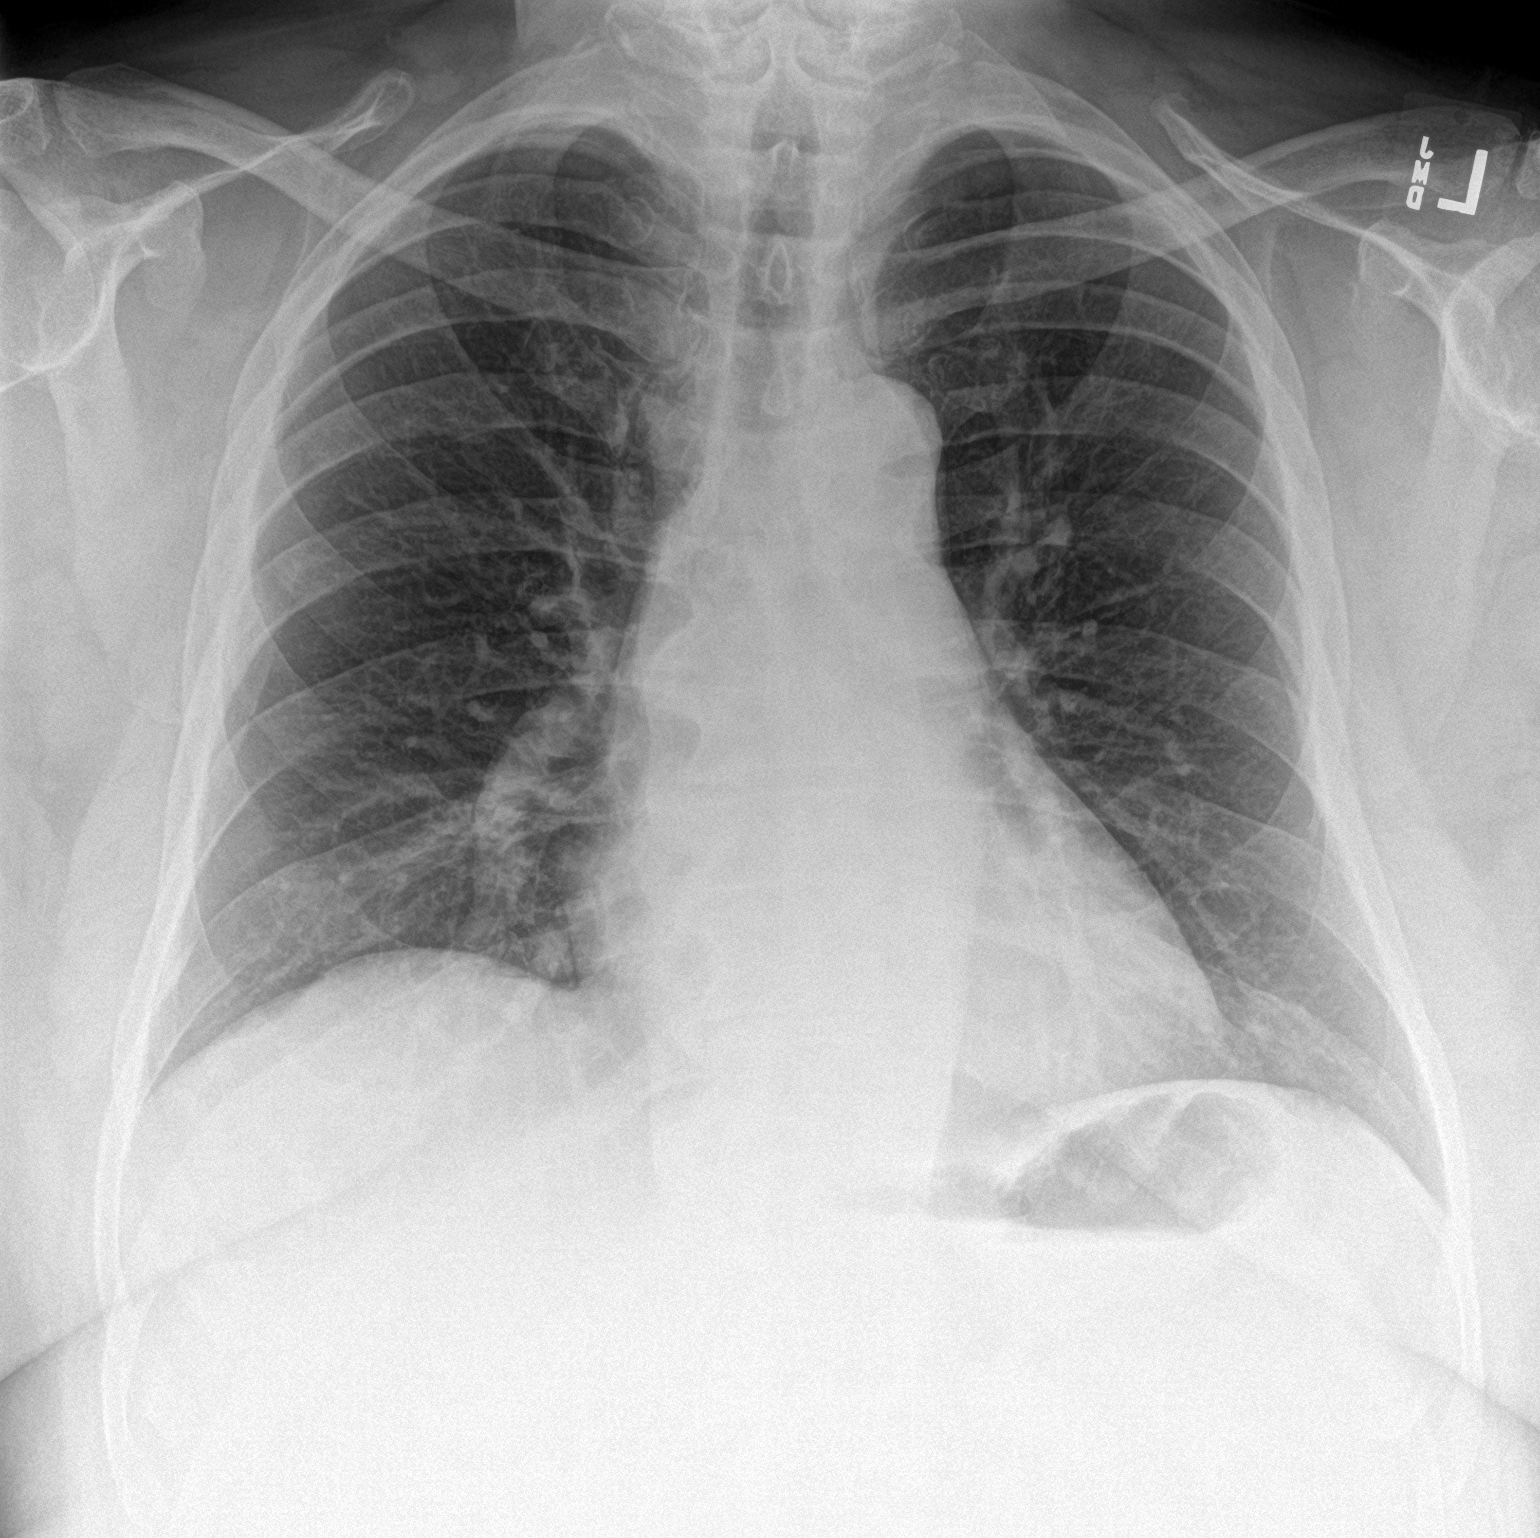

[chest lat]
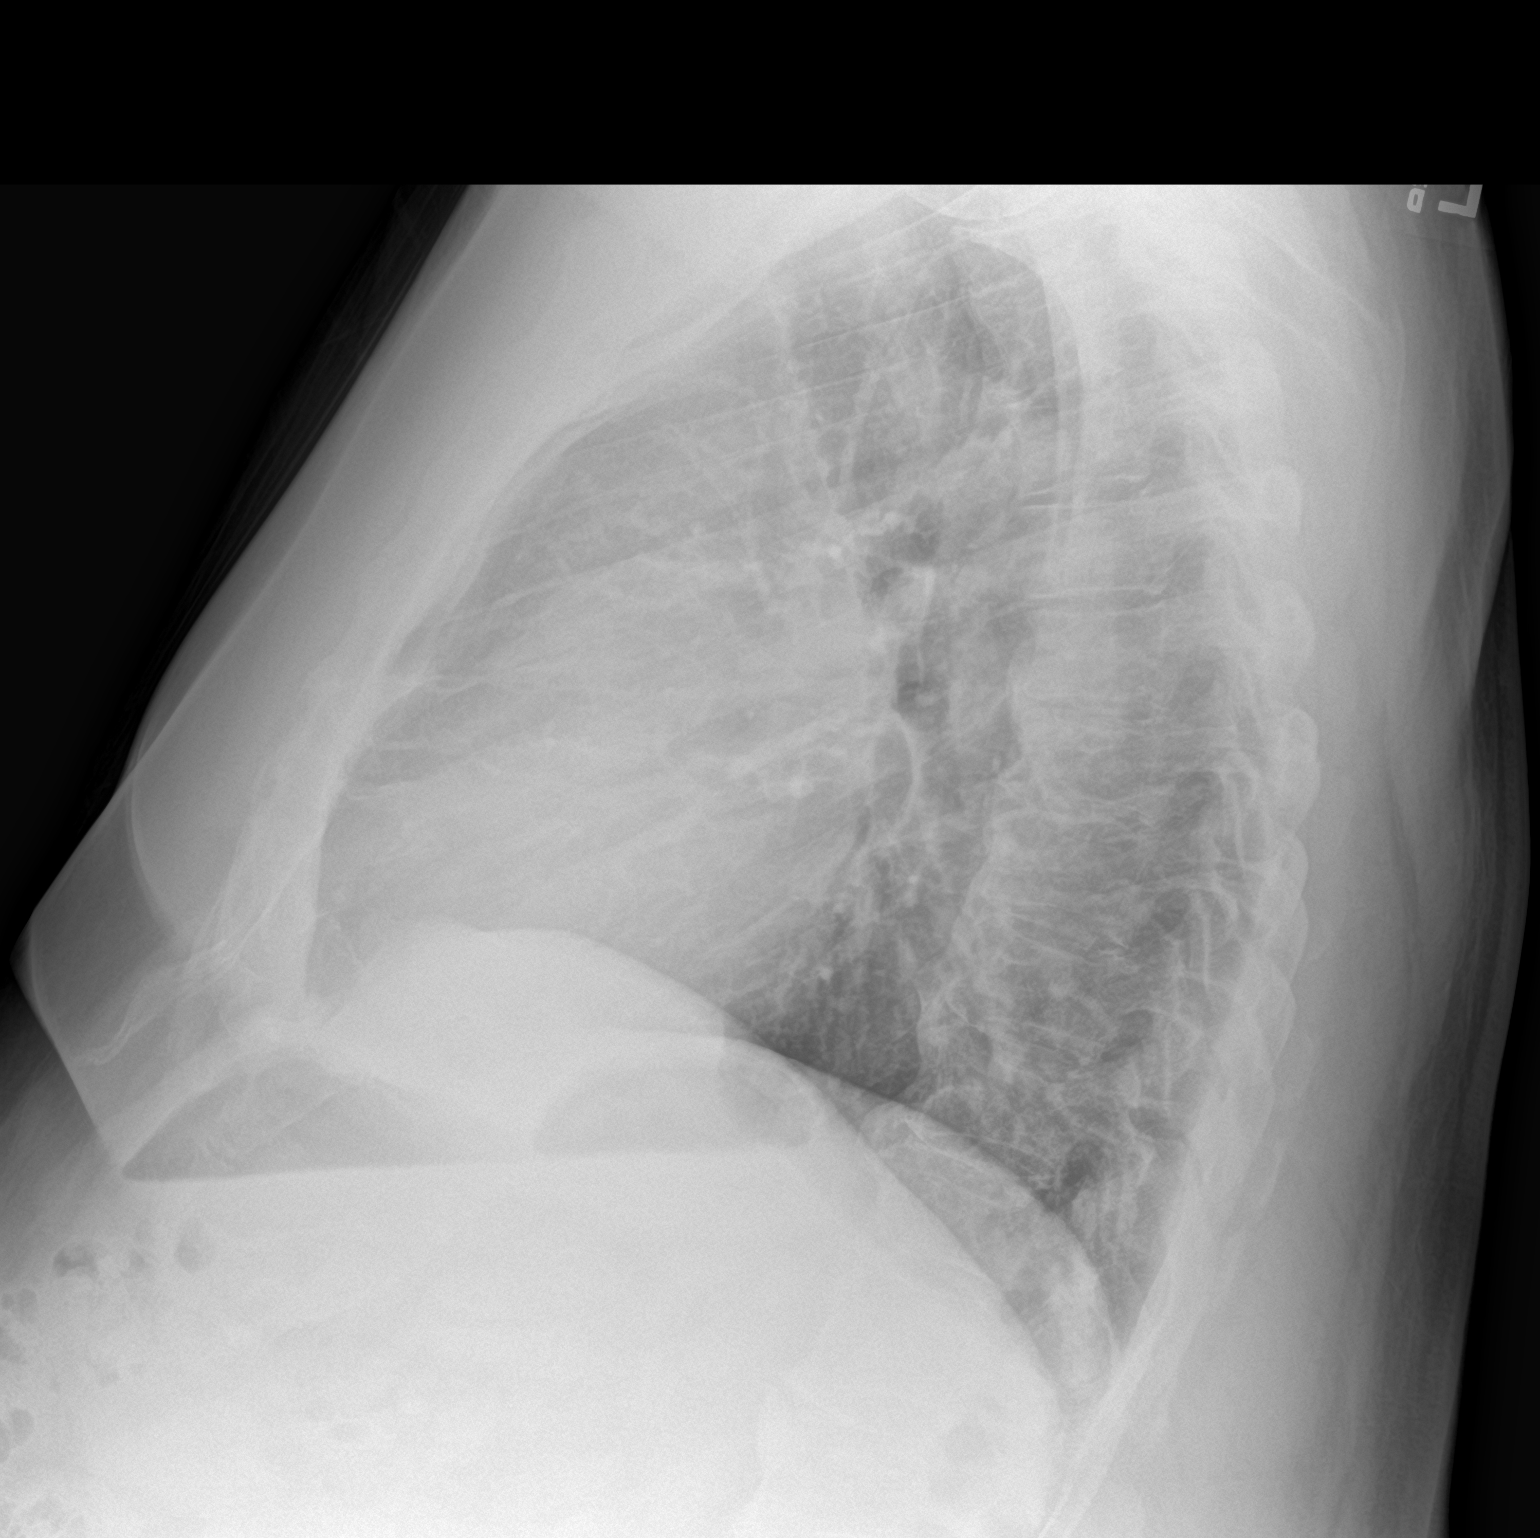

[2 of 2 positions shown; findings below may reference images not displayed]

FINDINGS: Stable cardiomediastinal silhouette with normal heart size. No
pneumothorax. No pleural effusion. Lungs appear clear, with no acute
consolidative airspace disease and no pulmonary edema.
IMPRESSION: No active cardiopulmonary disease.

## 2023-01-14 ENCOUNTER — Emergency Department (HOSPITAL_COMMUNITY)
Admission: EM | Admit: 2023-01-14 | Discharge: 2023-01-14 | Disposition: A | Discharge status: Home / Self Care | Payer: Medicare Other | Source: Home / Self Care | Attending: Emergency Medicine | Admitting: Emergency Medicine

## 2023-01-14 DIAGNOSIS — R310 Gross hematuria: Secondary | ICD-10-CM | POA: Diagnosis not present

## 2023-01-14 DIAGNOSIS — R7303 Prediabetes: Secondary | ICD-10-CM | POA: Diagnosis not present

## 2023-01-14 DIAGNOSIS — Z888 Allergy status to other drugs, medicaments and biological substances status: Secondary | ICD-10-CM | POA: Diagnosis not present

## 2023-01-14 DIAGNOSIS — T83091A Other mechanical complication of indwelling urethral catheter, initial encounter: Secondary | ICD-10-CM | POA: Diagnosis not present

## 2023-01-14 DIAGNOSIS — Z923 Personal history of irradiation: Secondary | ICD-10-CM | POA: Diagnosis not present

## 2023-01-14 DIAGNOSIS — Z91041 Radiographic dye allergy status: Secondary | ICD-10-CM | POA: Diagnosis not present

## 2023-01-14 DIAGNOSIS — R319 Hematuria, unspecified: Secondary | ICD-10-CM | POA: Insufficient documentation

## 2023-01-14 DIAGNOSIS — Z8546 Personal history of malignant neoplasm of prostate: Secondary | ICD-10-CM | POA: Insufficient documentation

## 2023-01-14 DIAGNOSIS — N3001 Acute cystitis with hematuria: Secondary | ICD-10-CM | POA: Diagnosis not present

## 2023-01-14 DIAGNOSIS — E785 Hyperlipidemia, unspecified: Secondary | ICD-10-CM | POA: Diagnosis not present

## 2023-01-14 DIAGNOSIS — K429 Umbilical hernia without obstruction or gangrene: Secondary | ICD-10-CM | POA: Diagnosis not present

## 2023-01-14 DIAGNOSIS — Z886 Allergy status to analgesic agent status: Secondary | ICD-10-CM | POA: Diagnosis not present

## 2023-01-14 DIAGNOSIS — Z800 Family history of malignant neoplasm of digestive organs: Secondary | ICD-10-CM | POA: Diagnosis not present

## 2023-01-14 DIAGNOSIS — R339 Retention of urine, unspecified: Secondary | ICD-10-CM | POA: Insufficient documentation

## 2023-01-14 DIAGNOSIS — N3041 Irradiation cystitis with hematuria: Secondary | ICD-10-CM | POA: Diagnosis not present

## 2023-01-14 DIAGNOSIS — Z791 Long term (current) use of non-steroidal anti-inflammatories (NSAID): Secondary | ICD-10-CM | POA: Diagnosis not present

## 2023-01-14 DIAGNOSIS — N3289 Other specified disorders of bladder: Secondary | ICD-10-CM | POA: Diagnosis not present

## 2023-01-14 DIAGNOSIS — N281 Cyst of kidney, acquired: Secondary | ICD-10-CM | POA: Diagnosis not present

## 2023-01-14 DIAGNOSIS — R338 Other retention of urine: Secondary | ICD-10-CM | POA: Diagnosis not present

## 2023-01-14 DIAGNOSIS — Z83719 Family history of colon polyps, unspecified: Secondary | ICD-10-CM | POA: Diagnosis not present

## 2023-01-14 DIAGNOSIS — Z96651 Presence of right artificial knee joint: Secondary | ICD-10-CM | POA: Diagnosis not present

## 2023-01-14 DIAGNOSIS — Z803 Family history of malignant neoplasm of breast: Secondary | ICD-10-CM | POA: Diagnosis not present

## 2023-01-14 DIAGNOSIS — Z79899 Other long term (current) drug therapy: Secondary | ICD-10-CM | POA: Insufficient documentation

## 2023-01-14 DIAGNOSIS — T83511A Infection and inflammatory reaction due to indwelling urethral catheter, initial encounter: Secondary | ICD-10-CM | POA: Diagnosis not present

## 2023-01-14 DIAGNOSIS — I10 Essential (primary) hypertension: Secondary | ICD-10-CM | POA: Insufficient documentation

## 2023-01-14 DIAGNOSIS — Z833 Family history of diabetes mellitus: Secondary | ICD-10-CM | POA: Diagnosis not present

## 2023-01-14 LAB — URINALYSIS, W/ REFLEX TO CULTURE (INFECTION SUSPECTED): RBC / HPF: >50 RBC/hpf (ref 0–5)

## 2023-01-14 LAB — I-STAT CHEM 8, ED
BUN: 23 mg/dL (ref 8–23)
Calcium, Ion: 1.19 mmol/L (ref 1.15–1.40)
Chloride: 104 mmol/L (ref 98–111)
Creatinine, Ser: 1.2 mg/dL (ref 0.61–1.24)
Glucose, Bld: 96 mg/dL (ref 70–99)
HCT: 35 % — ABNORMAL LOW (ref 39.0–52.0)
Hemoglobin: 11.9 g/dL — ABNORMAL LOW (ref 13.0–17.0)
Potassium: 4.2 mmol/L (ref 3.5–5.1)
Sodium: 140 mmol/L (ref 135–145)
TCO2: 26 mmol/L (ref 22–32)

## 2023-01-14 MED ORDER — CEPHALEXIN 500 MG PO CAPS: 500.0000 mg | ORAL_CAPSULE | Freq: Four times a day (QID) | ORAL | 0 refills | Status: DC

## 2023-01-14 MED ORDER — CEPHALEXIN 500 MG PO CAPS
500.0000 mg | ORAL_CAPSULE | Freq: Once | ORAL | Status: AC
  Administered 2023-01-14: 500 mg via ORAL
  Filled 2023-01-14: qty 1

## 2023-01-14 NOTE — ED Triage Notes (Signed)
Pt arrives with c/o urinary retention. Last urination was at 1030 this AM, and saw blood clots.

## 2023-01-14 NOTE — ED Provider Notes (Signed)
[place] EMERGENCY DEPARTMENT AT [facility] Provider Note   CSN: [id number] Arrival date & time: 01/14/23  1758     History {Add pertinent medical, surgical, social history, OB history to HPI:1} Chief Complaint  Patient presents with   Urinary Retention    [name] [name] is a 72 y.o. male.  HPI   Patient has a history of BPH obesity prostate cancer, hypertension, hyperlipidemia, kidney stones.  He presents today with complaints of urinary retention.  Patient states he was last able to urinate normally at around 1030 this morning.  He did notice some clots of blood in his urine.  Ever since that time has not been able to empty his bladder.  Patient feels like his bladder is distended.  He denies any fevers.   Home Medications Prior to Admission medications   Medication Sig Start Date End Date Taking? Authorizing Provider  acetaminophen (TYLENOL) 500 MG tablet Take 2 tablets (1,000 mg total) by mouth every 6 (six) hours as needed. Patient not taking: Reported on 11/10/2022 03/15/22 03/15/23  Chadwell, Ivin Booty, PA-C  ALPRAZolam Prudy Feeler) 0.5 MG tablet TAKE 1 TABLET BY MOUTH EVERY DAY AS NEEDED FOR ANXIETY Patient not taking: Reported on 11/10/2022 12/04/21   Georgina Quint, MD  amoxicillin-clavulanate (AUGMENTIN) 875-125 MG tablet Take 1 tablet by mouth 2 (two) times daily. Patient not taking: Reported on 11/10/2022 07/26/22   [provider]  cefpodoxime (VANTIN) 100 MG tablet Take 100 mg by mouth 2 (two) times daily. 10/30/22   [provider]  lisinopril-hydrochlorothiazide (ZESTORETIC) 20-12.5 MG tablet TAKE 1 TABLET BY MOUTH EVERY DAY 11/16/22   Georgina Quint, MD  meloxicam (MOBIC) 15 MG tablet Take 15 mg by mouth daily. 10/26/22   [provider]  oxyCODONE (OXY IR/ROXICODONE) 5 MG immediate release tablet Take one tab po q4-6hrs prn pain Patient not taking: Reported on 11/10/2022 03/15/22   Chadwell, Ivin Booty, PA-C  rosuvastatin (CRESTOR) 10  MG tablet TAKE 1 TABLET BY MOUTH EVERY DAY Patient taking differently: Take 10 mg by mouth daily. 08/18/22   Georgina Quint, MD  sulfamethoxazole-trimethoprim (BACTRIM DS) 800-160 MG tablet Take 1 tablet by mouth 2 (two) times daily. Patient not taking: Reported on 11/10/2022 03/16/22   Chadwell, Ivin Booty, PA-C  tiZANidine (ZANAFLEX) 4 MG tablet Take 1 tablet (4 mg total) by mouth 3 (three) times daily. Patient not taking: Reported on 11/10/2022 03/15/22 03/15/23  Margart Sickles, PA-C      Allergies    Aspirin, Statins, and Gadolinium derivatives    Review of Systems   Review of Systems  Physical Exam Updated Vital Signs BP (!) 148/81 (BP Location: Left Arm)   Pulse (!) 42   Temp 98.2 F (36.8 C) (Oral)   Resp 16   SpO2 98%  Physical Exam Vitals and nursing note reviewed.  Constitutional:      General: He is not in acute distress.    Appearance: He is well-developed.     Comments: Increased BMI  HENT:     Head: Normocephalic and atraumatic.     Right Ear: External ear normal.     Left Ear: External ear normal.  Eyes:     General: No scleral icterus.       Right eye: No discharge.        Left eye: No discharge.     Conjunctiva/sclera: Conjunctivae normal.  Neck:     Trachea: No tracheal deviation.  Cardiovascular:     Rate and Rhythm: Normal rate.  Pulmonary:     Effort: Pulmonary effort is normal. No respiratory distress.     Breath sounds: No stridor.  Abdominal:     General: There is no distension.     Tenderness: There is no abdominal tenderness.  Musculoskeletal:        General: No swelling or deformity.     Cervical back: Neck supple.  Skin:    General: Skin is warm and dry.     Findings: No rash.  Neurological:     Mental Status: He is alert. Mental status is at baseline.     Cranial Nerves: No dysarthria or facial asymmetry.     Motor: No seizure activity.     ED Results / Procedures / Treatments   Labs (all labs ordered are listed, but only  abnormal results are displayed) Labs Reviewed - No data to display  EKG None  Radiology No results found.  Procedures Procedures  {Document cardiac monitor, telemetry assessment procedure when appropriate:1}  Medications Ordered in ED Medications - No data to display  ED Course/ Medical Decision Making/ A&P   {   Click here for ABCD2, HEART and other calculatorsREFRESH Note before signing :1}                              Medical Decision Making  ***  {Document critical care time when appropriate:1} {Document review of labs and clinical decision tools ie heart score, Chads2Vasc2 etc:1}  {Document your independent review of radiology images, and any outside records:1} {Document your discussion with family members, caretakers, and with consultants:1} {Document social determinants of health affecting pt's care:1} {Document your decision making why or why not admission, treatments were needed:1} Final Clinical Impression(s) / ED Diagnoses Final diagnoses:  None    Rx / DC Orders ED Discharge Orders     None

## 2023-01-14 NOTE — Discharge Instructions (Addendum)
Take the antibiotics as prescribed.  Follow-up with your urologist for further evaluation as we discussed.  Return to the ED for complications associated with your catheter, fever, or other concerns

## 2023-01-16 ENCOUNTER — Encounter (HOSPITAL_COMMUNITY): Payer: Self-pay | Admitting: Emergency Medicine

## 2023-01-16 ENCOUNTER — Emergency Department (HOSPITAL_COMMUNITY)
Admission: EM | Admit: 2023-01-16 | Discharge: 2023-01-16 | Disposition: A | Discharge status: Home / Self Care | Payer: Medicare Other | Source: Home / Self Care | Attending: Emergency Medicine | Admitting: Emergency Medicine

## 2023-01-16 ENCOUNTER — Other Ambulatory Visit: Payer: Self-pay

## 2023-01-16 ENCOUNTER — Inpatient Hospital Stay (HOSPITAL_COMMUNITY)
Admission: EM | Admit: 2023-01-16 | Discharge: 2023-01-20 | DRG: 699 | Disposition: A | Discharge status: Home / Self Care | Payer: Medicare Other | Attending: Internal Medicine | Admitting: Internal Medicine

## 2023-01-16 DIAGNOSIS — T83091A Other mechanical complication of indwelling urethral catheter, initial encounter: Secondary | ICD-10-CM

## 2023-01-16 DIAGNOSIS — Y732 Prosthetic and other implants, materials and accessory gastroenterology and urology devices associated with adverse incidents: Secondary | ICD-10-CM | POA: Insufficient documentation

## 2023-01-16 DIAGNOSIS — R338 Other retention of urine: Secondary | ICD-10-CM | POA: Diagnosis present

## 2023-01-16 DIAGNOSIS — Z79899 Other long term (current) drug therapy: Secondary | ICD-10-CM | POA: Insufficient documentation

## 2023-01-16 DIAGNOSIS — R339 Retention of urine, unspecified: Secondary | ICD-10-CM | POA: Diagnosis present

## 2023-01-16 DIAGNOSIS — N401 Enlarged prostate with lower urinary tract symptoms: Secondary | ICD-10-CM | POA: Diagnosis present

## 2023-01-16 DIAGNOSIS — Z800 Family history of malignant neoplasm of digestive organs: Secondary | ICD-10-CM

## 2023-01-16 DIAGNOSIS — Y738 Miscellaneous gastroenterology and urology devices associated with adverse incidents, not elsewhere classified: Secondary | ICD-10-CM | POA: Diagnosis present

## 2023-01-16 DIAGNOSIS — I10 Essential (primary) hypertension: Secondary | ICD-10-CM | POA: Insufficient documentation

## 2023-01-16 DIAGNOSIS — Z8546 Personal history of malignant neoplasm of prostate: Secondary | ICD-10-CM | POA: Insufficient documentation

## 2023-01-16 DIAGNOSIS — Z833 Family history of diabetes mellitus: Secondary | ICD-10-CM

## 2023-01-16 DIAGNOSIS — E669 Obesity, unspecified: Secondary | ICD-10-CM | POA: Diagnosis present

## 2023-01-16 DIAGNOSIS — Y846 Urinary catheterization as the cause of abnormal reaction of the patient, or of later complication, without mention of misadventure at the time of the procedure: Secondary | ICD-10-CM | POA: Diagnosis present

## 2023-01-16 DIAGNOSIS — Z96651 Presence of right artificial knee joint: Secondary | ICD-10-CM | POA: Diagnosis present

## 2023-01-16 DIAGNOSIS — Z923 Personal history of irradiation: Secondary | ICD-10-CM

## 2023-01-16 DIAGNOSIS — Z888 Allergy status to other drugs, medicaments and biological substances status: Secondary | ICD-10-CM

## 2023-01-16 DIAGNOSIS — N3041 Irradiation cystitis with hematuria: Secondary | ICD-10-CM | POA: Diagnosis not present

## 2023-01-16 DIAGNOSIS — Z886 Allergy status to analgesic agent status: Secondary | ICD-10-CM

## 2023-01-16 DIAGNOSIS — R7303 Prediabetes: Secondary | ICD-10-CM | POA: Diagnosis present

## 2023-01-16 DIAGNOSIS — T839XXA Unspecified complication of genitourinary prosthetic device, implant and graft, initial encounter: Secondary | ICD-10-CM

## 2023-01-16 DIAGNOSIS — E785 Hyperlipidemia, unspecified: Secondary | ICD-10-CM | POA: Diagnosis present

## 2023-01-16 DIAGNOSIS — R310 Gross hematuria: Principal | ICD-10-CM

## 2023-01-16 DIAGNOSIS — Z83719 Family history of colon polyps, unspecified: Secondary | ICD-10-CM

## 2023-01-16 DIAGNOSIS — B9620 Unspecified Escherichia coli [E. coli] as the cause of diseases classified elsewhere: Secondary | ICD-10-CM | POA: Diagnosis present

## 2023-01-16 DIAGNOSIS — N3001 Acute cystitis with hematuria: Secondary | ICD-10-CM | POA: Diagnosis present

## 2023-01-16 DIAGNOSIS — Z6838 Body mass index (BMI) 38.0-38.9, adult: Secondary | ICD-10-CM

## 2023-01-16 DIAGNOSIS — Z91041 Radiographic dye allergy status: Secondary | ICD-10-CM

## 2023-01-16 DIAGNOSIS — T83511A Infection and inflammatory reaction due to indwelling urethral catheter, initial encounter: Principal | ICD-10-CM | POA: Diagnosis present

## 2023-01-16 DIAGNOSIS — Z803 Family history of malignant neoplasm of breast: Secondary | ICD-10-CM

## 2023-01-16 DIAGNOSIS — Z8042 Family history of malignant neoplasm of prostate: Secondary | ICD-10-CM

## 2023-01-16 DIAGNOSIS — Z791 Long term (current) use of non-steroidal anti-inflammatories (NSAID): Secondary | ICD-10-CM

## 2023-01-16 NOTE — ED Triage Notes (Signed)
Patient coming to ED for evaluation of urinary retention.  Reports he had a foley catheter placed on 8/6 for hematuria.  Tonight started having urinary retention and no drainage from catheter.  States "it is very painful and leaking around the catheter."  Patient unable to sit due to pain

## 2023-01-16 NOTE — ED Notes (Signed)
Patient presented to ED with foley catheter in place.  Reports urinary retention.  Catheter removed per PA verbal order

## 2023-01-16 NOTE — ED Triage Notes (Signed)
Patient coming to ED for evaluation of urinary retention.  Reports he had a urinary catheter placed last night due to urinary retention/hematuria with first catheter.  Went to urology today and had catheter irrigated due to blood clot.  Tonight clots increased and patient started having urinary retention.  C/o pain in bladder

## 2023-01-16 NOTE — Discharge Instructions (Addendum)
I recommend close follow-up with urology for reevaluation.  Please do not hesitate to return to emergency department if worrisome signs symptoms we discussed become apparent.

## 2023-01-16 NOTE — ED Notes (Signed)
Patient had 3 way catheter placed last night.  Catheter irrigated with 1000 mL NS.  Multiple large clots returned.  Patient reports decrease in pressure to bladder after irrigation.  Light pink urine output noted after irrigation.

## 2023-01-16 NOTE — ED Provider Notes (Signed)
[place] EMERGENCY DEPARTMENT AT [facility] Provider Note   CSN: [id number] Arrival date & time: 01/16/23  0038     History  Chief Complaint  Patient presents with   Urinary Retention    [name] [name] is a 72 y.o. male history of BPH, obesity, prostate cancer, hypertension, hyperlipidemia, kidney stones presents today for evaluation of Foley catheter issues.  Patient states that his Foley catheter stopped draining around 4 pm yesterday.  Foley was placed earlier yesterday and he has seen blood draining from Foley catheter since.  States he has has significant suprapubic pain.  Denies fever, nausea, vomiting, chest pain or shortness of breath.  HPI    Past Medical History:  Diagnosis Date   Allergy    seasonal allergies   Arthritis    generalized   Blood transfusion without reported diagnosis 2013   when had diverticulitis flare   BPH (benign prostatic hypertrophy)    Colon polyp    diverticular bleed with transfusion   Diverticulosis    History of kidney stones    Hyperlipidemia    on meds   Hypertension    on meds   Kidney stones    hx of 15-20 kidney stones    Obesity, unspecified    Pre-diabetes    Prostate cancer (HCC) 2020   dx 05/2019   Prostate nodule 07/27/2008   Past Surgical History:  Procedure Laterality Date   ANKLE FUSION Left 1991   COLONOSCOPY  2011   DB-F/V-miralax(good)-TICS/3 HPP   CYSTOSCOPY W/ RETROGRADES Bilateral 08/02/2022   Procedure: CYSTOSCOPY WITH BILATERAL RETROGRADE PYELOGRAM;  Surgeon: Crista Elliot, MD;  Location: [facility];  Service: Urology;  Laterality: Bilateral;   HERNIA REPAIR N/A    umbilical    POLYPECTOMY  2011   3 HPP   TOTAL KNEE ARTHROPLASTY Right 03/15/2022   Procedure: TOTAL KNEE ARTHROPLASTY;  Surgeon: Frederico Hamman, MD;  Location: [facility];  Service: Orthopedics;  Laterality: Right;   TRANSURETHRAL RESECTION OF BLADDER TUMOR N/A 08/02/2022   Procedure: TRANSURETHRAL RESECTION OF BLADDER TUMOR  (TURBT);  Surgeon: Crista Elliot, MD;  Location: [facility];  Service: Urology;  Laterality: N/A;  60 MINS   TRANSURETHRAL RESECTION OF PROSTATE  2020     Home Medications Prior to Admission medications   Medication Sig Start Date End Date Taking? Authorizing Provider  acetaminophen (TYLENOL) 500 MG tablet Take 2 tablets (1,000 mg total) by mouth every 6 (six) hours as needed. Patient not taking: Reported on 11/10/2022 03/15/22 03/15/23  Chadwell, Ivin Booty, PA-C  ALPRAZolam Prudy Feeler) 0.5 MG tablet TAKE 1 TABLET BY MOUTH EVERY DAY AS NEEDED FOR ANXIETY Patient not taking: Reported on 11/10/2022 12/04/21   Georgina Quint, MD  cephALEXin (KEFLEX) 500 MG capsule Take 1 capsule (500 mg total) by mouth 4 (four) times daily. 01/14/23   Fausto Dibbles, MD  lisinopril-hydrochlorothiazide (ZESTORETIC) 20-12.5 MG tablet TAKE 1 TABLET BY MOUTH EVERY DAY 11/16/22   Georgina Quint, MD  meloxicam (MOBIC) 15 MG tablet Take 15 mg by mouth daily. 10/26/22   [provider]  oxyCODONE (OXY IR/ROXICODONE) 5 MG immediate release tablet Take one tab po q4-6hrs prn pain Patient not taking: Reported on 11/10/2022 03/15/22   Chadwell, Ivin Booty, PA-C  rosuvastatin (CRESTOR) 10 MG tablet TAKE 1 TABLET BY MOUTH EVERY DAY Patient taking differently: Take 10 mg by mouth daily. 08/18/22   Georgina Quint, MD  tiZANidine (ZANAFLEX) 4 MG tablet Take 1 tablet (4 mg total) by  mouth 3 (three) times daily. Patient not taking: Reported on 11/10/2022 03/15/22 03/15/23  Margart Sickles, PA-C      Allergies    Aspirin, Statins, and Gadolinium derivatives    Review of Systems   Review of Systems Negative except as per HPI.  Physical Exam Updated Vital Signs BP (!) 195/82 (BP Location: Right Arm)   Pulse 84   Temp 98.2 F (36.8 C) (Oral)   Resp 18   SpO2 99%  Physical Exam Vitals and nursing note reviewed.  Constitutional:      Appearance: Normal appearance.  HENT:     Head: Normocephalic and atraumatic.      Mouth/Throat:     Mouth: Mucous membranes are moist.  Eyes:     General: No scleral icterus. Cardiovascular:     Rate and Rhythm: Normal rate and regular rhythm.     Pulses: Normal pulses.     Heart sounds: Normal heart sounds.  Pulmonary:     Effort: Pulmonary effort is normal.     Breath sounds: Normal breath sounds.  Abdominal:     General: Abdomen is flat.     Palpations: Abdomen is soft.     Tenderness: There is no abdominal tenderness.  Genitourinary:    Comments: Foley catheter in place. Musculoskeletal:        General: No deformity.  Skin:    General: Skin is warm.     Findings: No rash.  Neurological:     General: No focal deficit present.     Mental Status: He is alert.  Psychiatric:        Mood and Affect: Mood normal.    ED Results / Procedures / Treatments   Labs (all labs ordered are listed, but only abnormal results are displayed) Labs Reviewed - No data to display  EKG None  Radiology No results found.  Procedures Procedures    Medications Ordered in ED Medications - No data to display  ED Course/ Medical Decision Making/ A&P                                 Medical Decision Making  This patient presents to the ED for urinary retention, foley catheter , this involves an extensive number of treatment options, and is a complaint that carries with a high risk of complications and morbidity.  The differential diagnosis includes Foley catheter issue, urinary retention, AKI.  This is not an exhaustive list.  Problem list/ ED course/ Critical interventions/ Medical management: HPI: See above Vital signs within normal range and stable throughout visit. Laboratory/imaging studies significant for: See above. On physical examination, patient is afebrile and appears in no acute distress.  Patient returns to the ER for Foley catheter issue causing urinary retention since 4 pm yesterday.  New Foley catheter placed by nursing staff with good return.  Patient  reports relief of symptoms after Foley catheter replacement.  Bladder scan showed 120 cc of urine.  Patient has an appointment with urology this morning.  Vital signs are normal. He is stable for discharge.  Strict return precaution discussed. I have reviewed the patient home medicines and have made adjustments as needed.  Cardiac monitoring/EKG: The patient was maintained on a cardiac monitor.  I personally reviewed and interpreted the cardiac monitor which showed an underlying rhythm of: sinus rhythm.  Additional history obtained: External records from outside source obtained and reviewed including: Chart review including previous notes, labs,  imaging.  Consultations obtained:  Disposition Continued outpatient therapy. Follow-up with urology recommended for reevaluation of symptoms. Treatment plan discussed with patient.  Pt acknowledged understanding was agreeable to the plan. Worrisome signs and symptoms were discussed with patient, and patient acknowledged understanding to return to the ED if they noticed these signs and symptoms. Patient was stable upon discharge.   This chart was dictated using voice recognition software.  Despite best efforts to proofread,  errors can occur which can change the documentation meaning.          Final Clinical Impression(s) / ED Diagnoses Final diagnoses:  Problem with Foley catheter, initial encounter [facility])    Rx / DC Orders ED Discharge Orders     None         Jeanelle Malling, PA 01/16/23 [id number]    Glynn Octave, MD 01/16/23 [phone]

## 2023-01-16 NOTE — ED Notes (Signed)
Patient reports relief of symptoms with placement of new catheter

## 2023-01-16 NOTE — ED Provider Notes (Signed)
[place] EMERGENCY DEPARTMENT AT [facility] Provider Note   CSN: [id number] Arrival date & time: 01/16/23  2128     History  Chief Complaint  Patient presents with   Urinary Retention    [name] [name] is a 72 y.o. male.  The history is provided by the patient and medical records.   [name] [name] is a 72 y.o. male who presents to the Emergency Department complaining of Foley catheter obstruction.  He presents to the emergency department for nondraining Foley catheter.  He initially came into the emergency department on Tuesday for urinary retention and a catheter was placed and multiple clots were evacuated.  He was discharged and returned on Wednesday due to recurrent retention and had his catheter exchanged for a 22 French three-way catheter and was irrigated until the catheter cleared.  Today he had to present to the urology clinic due to recurrent obstruction around 10 AM and had his catheter irrigated.  About 5 PM today the catheter stopped draining and he presents for repeat evaluation.  Upon arrival to the emergency department he was irrigated with 1000 cc of fluid with recurrent drainage through the catheter tubing.  He does not take any anticoagulants.  No fever, chest pain, shortness of breath.  He does have some mild suprapubic discomfort, otherwise feels well.     Home Medications Prior to Admission medications   Medication Sig Start Date End Date Taking? Authorizing Provider  acetaminophen (TYLENOL) 500 MG tablet Take 2 tablets (1,000 mg total) by mouth every 6 (six) hours as needed. Patient not taking: Reported on 11/10/2022 03/15/22 03/15/23  Chadwell, Ivin Booty, PA-C  ALPRAZolam Prudy Feeler) 0.5 MG tablet TAKE 1 TABLET BY MOUTH EVERY DAY AS NEEDED FOR ANXIETY Patient not taking: Reported on 11/10/2022 12/04/21   Georgina Quint, MD  cephALEXin (KEFLEX) 500 MG capsule Take 1 capsule (500 mg total) by mouth 4 (four) times daily. 01/14/23   Dominyk Dibbles, MD   lisinopril-hydrochlorothiazide (ZESTORETIC) 20-12.5 MG tablet TAKE 1 TABLET BY MOUTH EVERY DAY 11/16/22   Georgina Quint, MD  meloxicam (MOBIC) 15 MG tablet Take 15 mg by mouth daily. 10/26/22   [provider]  oxyCODONE (OXY IR/ROXICODONE) 5 MG immediate release tablet Take one tab po q4-6hrs prn pain Patient not taking: Reported on 11/10/2022 03/15/22   Chadwell, Ivin Booty, PA-C  rosuvastatin (CRESTOR) 10 MG tablet TAKE 1 TABLET BY MOUTH EVERY DAY Patient taking differently: Take 10 mg by mouth daily. 08/18/22   Georgina Quint, MD  tiZANidine (ZANAFLEX) 4 MG tablet Take 1 tablet (4 mg total) by mouth 3 (three) times daily. Patient not taking: Reported on 11/10/2022 03/15/22 03/15/23  Margart Sickles, PA-C      Allergies    Aspirin, Statins, and Gadolinium derivatives    Review of Systems   Review of Systems  All other systems reviewed and are negative.   Physical Exam Updated Vital Signs BP (!) 139/91   Pulse 72   Temp 98.1 F (36.7 C)   Resp 18   Ht 6\' 2"  (1.88 m)   Wt 135.6 kg   SpO2 100%   BMI 38.39 kg/m  Physical Exam Vitals and nursing note reviewed.  Constitutional:      Appearance: He is well-developed.  HENT:     Head: Normocephalic and atraumatic.  Cardiovascular:     Rate and Rhythm: Normal rate and regular rhythm.  Pulmonary:     Effort: Pulmonary effort is normal. No respiratory distress.  Abdominal:  Palpations: Abdomen is soft.     Tenderness: There is no abdominal tenderness. There is no guarding or rebound.  Musculoskeletal:        General: No tenderness.  Skin:    General: Skin is warm and dry.  Neurological:     Mental Status: He is alert and oriented to person, place, and time.  Psychiatric:        Behavior: Behavior normal.     ED Results / Procedures / Treatments   Labs (all labs ordered are listed, but only abnormal results are displayed) Labs Reviewed  BASIC METABOLIC PANEL - Abnormal; Notable for the following  components:      Result Value   CO2 21 (*)    Glucose, Bld 115 (*)    BUN 25 (*)    All other components within normal limits  CBC WITH DIFFERENTIAL/PLATELET - Abnormal; Notable for the following components:   RBC 4.01 (*)    Hemoglobin 10.3 (*)    HCT 33.4 (*)    MCH 25.7 (*)    RDW 16.7 (*)    All other components within normal limits    EKG None  Radiology CT ABDOMEN PELVIS WO CONTRAST  Result Date: 01/17/2023 CLINICAL DATA:  Gross hematuria EXAM: CT ABDOMEN AND PELVIS WITHOUT CONTRAST TECHNIQUE: Multidetector CT imaging of the abdomen and pelvis was performed following the standard protocol without IV contrast. RADIATION DOSE REDUCTION: This exam was performed according to the departmental dose-optimization program which includes automated exposure control, adjustment of the mA and/or kV according to patient size and/or use of iterative reconstruction technique. COMPARISON:  02/26/2021 FINDINGS: Lower chest: No acute abnormality. Hepatobiliary: No focal liver abnormality is seen. No gallstones, gallbladder wall thickening, or biliary dilatation. Pancreas: Unremarkable Spleen: Unremarkable Adrenals/Urinary Tract: The adrenal glands are unremarkable. The kidneys are normal in size and position. Endo and exophytic simple cortical cyst is seen within the lower pole of the left kidney, stable since prior examination, and for which no follow-up imaging is recommended. The kidneys are otherwise unremarkable. Foley catheter balloon seen within a decompressed bladder lumen. Mild perivesicular inflammatory changes are present, best appreciated on coronal imaging, possibly reflecting changes of a superimposed infectious or inflammatory process. Stomach/Bowel: Pancolonic diverticulosis, severe within the distal segment, without superimposed acute inflammatory change. The stomach, small bowel, and large bowel are otherwise unremarkable. Appendix normal. No free intraperitoneal gas or fluid.  Vascular/Lymphatic: Mild aortoiliac atherosclerotic calcification. No aortic aneurysm. No pathologic adenopathy within the abdomen and pelvis. Reproductive: Fiducial markers are seen within the prostate gland. Mild prostatic enlargement. There is mild periprostatic inflammatory changes identified, similar to the adjacent bladder which may reflect changes of cystoprostatitis. Other: Umbilical hernia repair has been performed. No recurrent abdominal wall hernia. Musculoskeletal: Degenerative changes are seen within the lumbar spine. No acute bone abnormality. No lytic or blastic bone lesion. IMPRESSION: 1. Foley catheter balloon within a decompressed bladder lumen. Mild perivesicular and periprostatic inflammatory changes are present possibly reflecting changes of superimposed cystoprostatitis. 2. Pancolonic diverticulosis, severe within the distal segment, without superimposed acute inflammatory change. 3. Aortic atherosclerosis. Aortic Atherosclerosis (ICD10-I70.0). Electronically Signed   By: Helyn Numbers M.D.   On: 01/17/2023 00:58    Procedures Procedures    Medications Ordered in ED Medications  sodium chloride irrigation 0.9 % 3,000 mL (3,000 mLs Irrigation New Bag/Given 01/17/23 0038)    ED Course/ Medical Decision Making/ A&P  Medical Decision Making Amount and/or Complexity of Data Reviewed Labs: ordered. Radiology: ordered.  Risk Prescription drug management. Decision regarding hospitalization.   Patient here for evaluation of recurrent hematuria with Foley catheter obstruction.  The obstruction has been removed by nursing after irrigation with 1000 cc of fluid.  He does have red-tinged urine in the catheter tubing.  Discussed with Dr. Lafonda Mosses with urology-recommendation for CT imaging to evaluate for additional clot retention as well as continuous bladder irrigation with medicine admission.  Lab with stable renal function.  He does have mild anemia  without thrombocytopenia.  Patient updated of findings of studies and recommendation for admission.  Medicine consulted for admission.        Final Clinical Impression(s) / ED Diagnoses Final diagnoses:  Gross hematuria  Obstruction of Foley catheter, initial encounter [facility])    Rx / DC Orders ED Discharge Orders     None         Tilden Fossa, MD 01/17/23 [phone]

## 2023-01-17 ENCOUNTER — Encounter (HOSPITAL_COMMUNITY): Payer: Self-pay | Admitting: Family Medicine

## 2023-01-17 ENCOUNTER — Emergency Department (HOSPITAL_COMMUNITY): Payer: Medicare Other

## 2023-01-17 ENCOUNTER — Other Ambulatory Visit: Payer: Self-pay

## 2023-01-17 DIAGNOSIS — Z886 Allergy status to analgesic agent status: Secondary | ICD-10-CM | POA: Diagnosis not present

## 2023-01-17 DIAGNOSIS — Z803 Family history of malignant neoplasm of breast: Secondary | ICD-10-CM | POA: Diagnosis not present

## 2023-01-17 DIAGNOSIS — Z91041 Radiographic dye allergy status: Secondary | ICD-10-CM | POA: Diagnosis not present

## 2023-01-17 DIAGNOSIS — R310 Gross hematuria: Secondary | ICD-10-CM

## 2023-01-17 DIAGNOSIS — Z923 Personal history of irradiation: Secondary | ICD-10-CM | POA: Diagnosis not present

## 2023-01-17 DIAGNOSIS — Y846 Urinary catheterization as the cause of abnormal reaction of the patient, or of later complication, without mention of misadventure at the time of the procedure: Secondary | ICD-10-CM | POA: Diagnosis present

## 2023-01-17 DIAGNOSIS — B9620 Unspecified Escherichia coli [E. coli] as the cause of diseases classified elsewhere: Secondary | ICD-10-CM | POA: Diagnosis present

## 2023-01-17 DIAGNOSIS — Z800 Family history of malignant neoplasm of digestive organs: Secondary | ICD-10-CM | POA: Diagnosis not present

## 2023-01-17 DIAGNOSIS — Z833 Family history of diabetes mellitus: Secondary | ICD-10-CM | POA: Diagnosis not present

## 2023-01-17 DIAGNOSIS — Z83719 Family history of colon polyps, unspecified: Secondary | ICD-10-CM | POA: Diagnosis not present

## 2023-01-17 DIAGNOSIS — T83511A Infection and inflammatory reaction due to indwelling urethral catheter, initial encounter: Secondary | ICD-10-CM | POA: Diagnosis present

## 2023-01-17 DIAGNOSIS — I10 Essential (primary) hypertension: Secondary | ICD-10-CM | POA: Diagnosis present

## 2023-01-17 DIAGNOSIS — T83091A Other mechanical complication of indwelling urethral catheter, initial encounter: Secondary | ICD-10-CM | POA: Diagnosis not present

## 2023-01-17 DIAGNOSIS — Z96651 Presence of right artificial knee joint: Secondary | ICD-10-CM | POA: Diagnosis present

## 2023-01-17 DIAGNOSIS — N3289 Other specified disorders of bladder: Secondary | ICD-10-CM | POA: Diagnosis not present

## 2023-01-17 DIAGNOSIS — K429 Umbilical hernia without obstruction or gangrene: Secondary | ICD-10-CM | POA: Diagnosis not present

## 2023-01-17 DIAGNOSIS — Z8042 Family history of malignant neoplasm of prostate: Secondary | ICD-10-CM | POA: Diagnosis not present

## 2023-01-17 DIAGNOSIS — N3001 Acute cystitis with hematuria: Secondary | ICD-10-CM | POA: Diagnosis present

## 2023-01-17 DIAGNOSIS — Z791 Long term (current) use of non-steroidal anti-inflammatories (NSAID): Secondary | ICD-10-CM | POA: Diagnosis not present

## 2023-01-17 DIAGNOSIS — Z8546 Personal history of malignant neoplasm of prostate: Secondary | ICD-10-CM | POA: Diagnosis not present

## 2023-01-17 DIAGNOSIS — R338 Other retention of urine: Secondary | ICD-10-CM | POA: Diagnosis present

## 2023-01-17 DIAGNOSIS — E785 Hyperlipidemia, unspecified: Secondary | ICD-10-CM | POA: Diagnosis present

## 2023-01-17 DIAGNOSIS — Y738 Miscellaneous gastroenterology and urology devices associated with adverse incidents, not elsewhere classified: Secondary | ICD-10-CM | POA: Diagnosis present

## 2023-01-17 DIAGNOSIS — R339 Retention of urine, unspecified: Secondary | ICD-10-CM | POA: Diagnosis not present

## 2023-01-17 DIAGNOSIS — N401 Enlarged prostate with lower urinary tract symptoms: Secondary | ICD-10-CM | POA: Diagnosis present

## 2023-01-17 DIAGNOSIS — Z79899 Other long term (current) drug therapy: Secondary | ICD-10-CM | POA: Diagnosis not present

## 2023-01-17 DIAGNOSIS — E669 Obesity, unspecified: Secondary | ICD-10-CM | POA: Diagnosis present

## 2023-01-17 DIAGNOSIS — Z6838 Body mass index (BMI) 38.0-38.9, adult: Secondary | ICD-10-CM | POA: Diagnosis not present

## 2023-01-17 DIAGNOSIS — N281 Cyst of kidney, acquired: Secondary | ICD-10-CM | POA: Diagnosis not present

## 2023-01-17 DIAGNOSIS — R7303 Prediabetes: Secondary | ICD-10-CM | POA: Diagnosis present

## 2023-01-17 DIAGNOSIS — N3041 Irradiation cystitis with hematuria: Secondary | ICD-10-CM | POA: Diagnosis not present

## 2023-01-17 DIAGNOSIS — Z888 Allergy status to other drugs, medicaments and biological substances status: Secondary | ICD-10-CM | POA: Diagnosis not present

## 2023-01-17 LAB — CBC WITH DIFFERENTIAL/PLATELET
Abs Immature Granulocytes: 0.03 10*3/uL (ref 0.00–0.07)
Basophils Absolute: 0 10*3/uL (ref 0.0–0.1)
Basophils Relative: 0 %
Eosinophils Absolute: 0.2 10*3/uL (ref 0.0–0.5)
Eosinophils Relative: 3 %
HCT: 33.4 % — ABNORMAL LOW (ref 39.0–52.0)
Hemoglobin: 10.3 g/dL — ABNORMAL LOW (ref 13.0–17.0)
Immature Granulocytes: 0 %
Lymphocytes Relative: 18 %
Lymphs Abs: 1.3 10*3/uL (ref 0.7–4.0)
MCH: 25.7 pg — ABNORMAL LOW (ref 26.0–34.0)
MCHC: 30.8 g/dL (ref 30.0–36.0)
MCV: 83.3 fL (ref 80.0–100.0)
Monocytes Absolute: 0.5 10*3/uL (ref 0.1–1.0)
Monocytes Relative: 7 %
Neutro Abs: 5.3 10*3/uL (ref 1.7–7.7)
Neutrophils Relative %: 72 %
Platelets: 157 10*3/uL (ref 150–400)
RBC: 4.01 MIL/uL — ABNORMAL LOW (ref 4.22–5.81)
RDW: 16.7 % — ABNORMAL HIGH (ref 11.5–15.5)
WBC: 7.4 10*3/uL (ref 4.0–10.5)
nRBC: 0 % (ref 0.0–0.2)

## 2023-01-17 LAB — BASIC METABOLIC PANEL
Anion gap: 10 (ref 5–15)
BUN: 25 mg/dL — ABNORMAL HIGH (ref 8–23)
CO2: 21 mmol/L — ABNORMAL LOW (ref 22–32)
Calcium: 9.1 mg/dL (ref 8.9–10.3)
Chloride: 104 mmol/L (ref 98–111)
Creatinine, Ser: 1.11 mg/dL (ref 0.61–1.24)
GFR, Estimated: >60 mL/min (ref >60)
Glucose, Bld: 115 mg/dL — ABNORMAL HIGH (ref 70–99)
Potassium: 3.9 mmol/L (ref 3.5–5.1)
Sodium: 135 mmol/L (ref 135–145)

## 2023-01-17 LAB — HIV ANTIBODY (ROUTINE TESTING W REFLEX): HIV Screen 4th Generation wRfx: NONREACTIVE

## 2023-01-17 MED ORDER — SODIUM CHLORIDE 0.9 % IV SOLN: INTRAVENOUS | Status: DC

## 2023-01-17 MED ORDER — LISINOPRIL-HYDROCHLOROTHIAZIDE 20-12.5 MG PO TABS: 1.0000 | ORAL_TABLET | Freq: Every day | ORAL | Status: DC

## 2023-01-17 MED ORDER — SENNOSIDES-DOCUSATE SODIUM 8.6-50 MG PO TABS: 1.0000 | ORAL_TABLET | Freq: Every evening | ORAL | Status: DC | PRN

## 2023-01-17 MED ORDER — IPRATROPIUM-ALBUTEROL 0.5-2.5 (3) MG/3ML IN SOLN: 3.0000 mL | Freq: Four times a day (QID) | RESPIRATORY_TRACT | Status: DC | PRN

## 2023-01-17 MED ORDER — TRAZODONE HCL 50 MG PO TABS
25.0000 mg | ORAL_TABLET | Freq: Every evening | ORAL | Status: DC | PRN
  Administered 2023-01-18 – 2023-01-19 (×2): 25 mg via ORAL
  Filled 2023-01-17 (×2): qty 1

## 2023-01-17 MED ORDER — HYDRALAZINE HCL 20 MG/ML IJ SOLN: 5.0000 mg | Freq: Three times a day (TID) | INTRAMUSCULAR | Status: DC | PRN

## 2023-01-17 MED ORDER — ACETAMINOPHEN 325 MG PO TABS: 650.0000 mg | ORAL_TABLET | Freq: Four times a day (QID) | ORAL | Status: DC | PRN

## 2023-01-17 MED ORDER — ALBUTEROL SULFATE (2.5 MG/3ML) 0.083% IN NEBU: 2.5000 mg | INHALATION_SOLUTION | Freq: Four times a day (QID) | RESPIRATORY_TRACT | Status: DC | PRN

## 2023-01-17 MED ORDER — MORPHINE SULFATE (PF) 2 MG/ML IV SOLN: 1.0000 mg | Freq: Four times a day (QID) | INTRAVENOUS | Status: DC | PRN

## 2023-01-17 MED ORDER — SODIUM CHLORIDE 0.9 % IR SOLN
3000.0000 mL | Status: DC
  Administered 2023-01-17 – 2023-01-19 (×9): 3000 mL

## 2023-01-17 MED ORDER — ACETAMINOPHEN 650 MG RE SUPP: 650.0000 mg | Freq: Four times a day (QID) | RECTAL | Status: DC | PRN

## 2023-01-17 MED ORDER — ONDANSETRON HCL 4 MG PO TABS: 4.0000 mg | ORAL_TABLET | Freq: Four times a day (QID) | ORAL | Status: DC | PRN

## 2023-01-17 MED ORDER — IPRATROPIUM BROMIDE 0.02 % IN SOLN: 0.5000 mg | Freq: Four times a day (QID) | RESPIRATORY_TRACT | Status: DC | PRN

## 2023-01-17 MED ORDER — LISINOPRIL 20 MG PO TABS
20.0000 mg | ORAL_TABLET | Freq: Every day | ORAL | Status: DC
  Administered 2023-01-17 – 2023-01-20 (×4): 20 mg via ORAL
  Filled 2023-01-17 (×2): qty 1
  Filled 2023-01-17: qty 2
  Filled 2023-01-17: qty 1

## 2023-01-17 MED ORDER — ROSUVASTATIN CALCIUM 10 MG PO TABS
10.0000 mg | ORAL_TABLET | Freq: Every day | ORAL | Status: DC
  Administered 2023-01-17 – 2023-01-19 (×3): 10 mg via ORAL
  Filled 2023-01-17 (×4): qty 1

## 2023-01-17 MED ORDER — SODIUM CHLORIDE 0.9% FLUSH
3.0000 mL | Freq: Two times a day (BID) | INTRAVENOUS | Status: DC
  Administered 2023-01-17 – 2023-01-20 (×7): 3 mL via INTRAVENOUS

## 2023-01-17 MED ORDER — CEPHALEXIN 500 MG PO CAPS
500.0000 mg | ORAL_CAPSULE | Freq: Three times a day (TID) | ORAL | Status: DC
  Administered 2023-01-17 – 2023-01-20 (×10): 500 mg via ORAL
  Filled 2023-01-17 (×11): qty 1

## 2023-01-17 MED ORDER — ONDANSETRON HCL 4 MG/2ML IJ SOLN: 4.0000 mg | Freq: Four times a day (QID) | INTRAMUSCULAR | Status: DC | PRN

## 2023-01-17 MED ORDER — BISACODYL 5 MG PO TBEC: 5.0000 mg | DELAYED_RELEASE_TABLET | Freq: Every day | ORAL | Status: DC | PRN

## 2023-01-17 MED ORDER — HYDROCHLOROTHIAZIDE 12.5 MG PO TABS
12.5000 mg | ORAL_TABLET | Freq: Every day | ORAL | Status: DC
  Administered 2023-01-17: 12.5 mg via ORAL
  Filled 2023-01-17: qty 1

## 2023-01-17 MED ORDER — CHLORHEXIDINE GLUCONATE CLOTH 2 % EX PADS
6.0000 | MEDICATED_PAD | Freq: Every day | CUTANEOUS | Status: DC
  Administered 2023-01-17 – 2023-01-19 (×3): 6 via TOPICAL

## 2023-01-17 MED ORDER — HYDROCODONE-ACETAMINOPHEN 5-325 MG PO TABS
1.0000 | ORAL_TABLET | ORAL | Status: DC | PRN
  Administered 2023-01-18 – 2023-01-19 (×3): 2 via ORAL
  Filled 2023-01-17 (×4): qty 2

## 2023-01-17 NOTE — ED Notes (Signed)
Had 3000 ml of pink fluid drained from catheter. Finished irrigation bag 3

## 2023-01-17 NOTE — Progress Notes (Signed)
[facility] PLAN OF CARE NOTE Patient: [name] [name] VHQ:[id number]   PCP: Georgina Quint, MD DOB: [birth year]   DOA: 01/16/2023   DOS: 01/17/2023    Patient was admitted by my colleague earlier on 01/17/2023. I have reviewed the H&P as well as assessment and plan and agree with the same. Important changes in the plan are listed below.  Plan of care: Principal Problem:   Urinary retention Active Problems:   Gross hematuria   Obstruction of Foley catheter (HCC)   Hematuria due to acute cystitis Continue antibiotic. Continue CBI. Continue fluid.  Level of care: Med-Surg   Author: Lynden Oxford, MD  [facility] 01/17/2023 7:44 PM   If 7PM-7AM, please contact night-coverage at [email or web address]

## 2023-01-17 NOTE — Progress Notes (Signed)
CBI running at [employer]. Pink lemonade irrigant. No clot accumulation in tubing.

## 2023-01-17 NOTE — H&P (Signed)
History and Physical   [facility] [place] [facility] Admission History and Physical [employer], D.O.    Patient Name: [name] [name] MR#: [id number] Date of Birth: 1950-10-11 Date of Admission: 01/16/2023  Referring MD/NP/PA: Dr. Madilyn Hook Primary Care Physician: Georgina Quint, MD  Chief Complaint:  Chief Complaint  Patient presents with   Urinary Retention    HPI: [name] [name] is a 72 y.o. male with a known history of allergies, arthritis, BPH, kidney stones, prostate cancer, radiation cystitis, hypertension, hyperlipidemia presents to the emergency department for evaluation of urinary retention and hematuria.    Patient was seen in the emergency department 3 days ago for similar symptoms at which time he had a Foley catheter placed.  He was again seen overnight last night with similar symptoms three-way catheter was placed in the emergency department with improvement in his symptoms.  He saw urology today and had his catheter irrigated due to blood clots.  But he returns to the emergency department again tonight because he reports that his Foley stopped draining around 4 PM and has accompanying lower abdominal pain.  Urology saw him in the emergency department and irrigated his Foley, titrated to CBI to clear urine drainage.  He is requesting admission for CBI and will follow.  On my evaluation patient is feeling significantly improved.   Review of Systems:  CONSTITUTIONAL: No fever/chills, fatigue, weakness, weight gain/loss, headache. EYES: No blurry or double vision. ENT: No tinnitus, postnasal drip, redness or soreness of the oropharynx. RESPIRATORY: No cough, dyspnea, wheeze.  No hemoptysis.  CARDIOVASCULAR: No chest pain, palpitations, syncope, orthopnea. No lower extremity edema.  GASTROINTESTINAL: No nausea, vomiting, abdominal pain, diarrhea, constipation.  No hematemesis, melena or hematochezia. GENITOURINARY: Urinary retention and suprapubic pain,  hematuria with clots ENDOCRINE: No polyuria or nocturia. No heat or cold intolerance. HEMATOLOGY: No anemia, bruising, bleeding. INTEGUMENTARY: No rashes, ulcers, lesions. MUSCULOSKELETAL: No arthritis, gout. NEUROLOGIC: No numbness, tingling, ataxia, seizure-type activity, weakness. PSYCHIATRIC: No anxiety, depression, insomnia.   Past Medical History:  Diagnosis Date   Allergy    seasonal allergies   Arthritis    generalized   Blood transfusion without reported diagnosis 2013   when had diverticulitis flare   BPH (benign prostatic hypertrophy)    Colon polyp    diverticular bleed with transfusion   Diverticulosis    History of kidney stones    Hyperlipidemia    on meds   Hypertension    on meds   Kidney stones    hx of 15-20 kidney stones    Obesity, unspecified    Pre-diabetes    Prostate cancer (HCC) 2020   dx 05/2019   Prostate nodule 07/27/2008    Past Surgical History:  Procedure Laterality Date   ANKLE FUSION Left 1991   COLONOSCOPY  2011   DB-F/V-miralax(good)-TICS/3 HPP   CYSTOSCOPY W/ RETROGRADES Bilateral 08/02/2022   Procedure: CYSTOSCOPY WITH BILATERAL RETROGRADE PYELOGRAM;  Surgeon: Crista Elliot, MD;  Location: [facility];  Service: Urology;  Laterality: Bilateral;   HERNIA REPAIR N/A    umbilical    POLYPECTOMY  2011   3 HPP   TOTAL KNEE ARTHROPLASTY Right 03/15/2022   Procedure: TOTAL KNEE ARTHROPLASTY;  Surgeon: Frederico Hamman, MD;  Location: [facility];  Service: Orthopedics;  Laterality: Right;   TRANSURETHRAL RESECTION OF BLADDER TUMOR N/A 08/02/2022   Procedure: TRANSURETHRAL RESECTION OF BLADDER TUMOR (TURBT);  Surgeon: Crista Elliot, MD;  Location: [facility];  Service: Urology;  Laterality: N/A;  60 MINS   TRANSURETHRAL RESECTION OF PROSTATE  2020     reports that he has never smoked. He has never used smokeless tobacco. He reports that he does not drink alcohol and does not use drugs.  Allergies  Allergen Reactions   Aspirin      REACTION: upsets stomach   Statins     REACTION: UPSETS STOMACH   Gadolinium Derivatives Nausea And Vomiting    Pt was given 20 ml multihance and became nauseated and vomited several times.     Family History  Problem Relation Age of Onset   Diabetes Mother    Breast cancer Mother 84   Prostate cancer Father        patient did not have a relationship with his father but understands he had prostate ca   Colon polyps Father 41   Colon cancer Father 78   Diabetes Sister    Pancreatic cancer Neg Hx    Esophageal cancer Neg Hx    Stomach cancer Neg Hx    Rectal cancer Neg Hx     Prior to Admission medications   Medication Sig Start Date End Date Taking? Authorizing Provider  acetaminophen (TYLENOL) 500 MG tablet Take 2 tablets (1,000 mg total) by mouth every 6 (six) hours as needed. Patient not taking: Reported on 11/10/2022 03/15/22 03/15/23  Chadwell, Ivin Booty, PA-C  ALPRAZolam Prudy Feeler) 0.5 MG tablet TAKE 1 TABLET BY MOUTH EVERY DAY AS NEEDED FOR ANXIETY Patient not taking: Reported on 11/10/2022 12/04/21   Georgina Quint, MD  cephALEXin (KEFLEX) 500 MG capsule Take 1 capsule (500 mg total) by mouth 4 (four) times daily. 01/14/23   Jayd Dibbles, MD  lisinopril-hydrochlorothiazide (ZESTORETIC) 20-12.5 MG tablet TAKE 1 TABLET BY MOUTH EVERY DAY 11/16/22   Georgina Quint, MD  meloxicam (MOBIC) 15 MG tablet Take 15 mg by mouth daily. 10/26/22   [provider]  oxyCODONE (OXY IR/ROXICODONE) 5 MG immediate release tablet Take one tab po q4-6hrs prn pain Patient not taking: Reported on 11/10/2022 03/15/22   Chadwell, Ivin Booty, PA-C  rosuvastatin (CRESTOR) 10 MG tablet TAKE 1 TABLET BY MOUTH EVERY DAY Patient taking differently: Take 10 mg by mouth daily. 08/18/22   Georgina Quint, MD  tiZANidine (ZANAFLEX) 4 MG tablet Take 1 tablet (4 mg total) by mouth 3 (three) times daily. Patient not taking: Reported on 11/10/2022 03/15/22 03/15/23  Margart Sickles, PA-C    Physical  Exam: Vitals:   01/16/23 2145 01/16/23 2205  BP: 134/83   Pulse: 85   Resp: 18   Temp: 98.2 F (36.8 C)   TempSrc: Oral   SpO2: 97%   Weight:  135.6 kg  Height:  6\' 2"  (1.88 m)    GENERAL: 72 y.o.-year-old black male patient, well-developed, well-nourished lying in the bed in no acute distress.  Pleasant and cooperative.   HEENT: Head atraumatic, normocephalic. Pupils equal. Mucus membranes moist. NECK: Supple. No JVD. CHEST: Normal breath sounds bilaterally. No wheezing, rales, rhonchi or crackles. No use of accessory muscles of respiration.  No reproducible chest wall tenderness.  CARDIOVASCULAR: S1, S2 normal. No murmurs, rubs, or gallops. Cap refill <2 seconds. Pulses intact distally.  ABDOMEN: Foley draining pink/red.  Soft, nondistended, nontender. No rebound, guarding, rigidity. Normoactive bowel sounds present in all four quadrants.  EXTREMITIES: No pedal edema, cyanosis, or clubbing. No calf tenderness or Homan's sign.  NEUROLOGIC: The patient is alert and oriented x 3. Cranial nerves II through XII are grossly intact with no focal sensorimotor  deficit. PSYCHIATRIC:  Normal affect, mood, thought content. SKIN: Warm, dry, and intact without obvious rash, lesion, or ulcer.    Labs on Admission:  CBC: Recent Labs  Lab 01/14/23 1903 01/17/23 0030  WBC  --  7.4  NEUTROABS  --  5.3  HGB 11.9* 10.3*  HCT 35.0* 33.4*  MCV  --  83.3  PLT  --  157   Basic Metabolic Panel: Recent Labs  Lab 01/14/23 1903 01/17/23 0030  NA 140 135  K 4.2 3.9  CL 104 104  CO2  --  21*  GLUCOSE 96 115*  BUN 23 25*  CREATININE 1.20 1.11  CALCIUM  --  9.1   GFR: Estimated Creatinine Clearance: 90.7 mL/min (by C-G formula based on SCr of 1.11 mg/dL). Liver Function Tests: No results for input(s): "AST", "ALT", "ALKPHOS", "BILITOT", "PROT", "ALBUMIN" in the last 168 hours. No results for input(s): "LIPASE", "AMYLASE" in the last 168 hours. No results for input(s): "AMMONIA" in the  last 168 hours. Coagulation Profile: No results for input(s): "INR", "PROTIME" in the last 168 hours. Cardiac Enzymes: No results for input(s): "CKTOTAL", "CKMB", "CKMBINDEX", "TROPONINI" in the last 168 hours. BNP (last 3 results) No results for input(s): "PROBNP" in the last 8760 hours. HbA1C: No results for input(s): "HGBA1C" in the last 72 hours. CBG: No results for input(s): "GLUCAP" in the last 168 hours. Lipid Profile: No results for input(s): "CHOL", "HDL", "LDLCALC", "TRIG", "CHOLHDL", "LDLDIRECT" in the last 72 hours. Thyroid Function Tests: No results for input(s): "TSH", "T4TOTAL", "FREET4", "T3FREE", "THYROIDAB" in the last 72 hours. Anemia Panel: No results for input(s): "VITAMINB12", "FOLATE", "FERRITIN", "TIBC", "IRON", "RETICCTPCT" in the last 72 hours. Urine analysis:    Component Value Date/Time   COLORURINE RED (A) 01/14/2023 1922   APPEARANCEUR TURBID (A) 01/14/2023 1922   LABSPEC  01/14/2023 1922    TEST NOT REPORTED DUE TO COLOR INTERFERENCE OF URINE PIGMENT   PHURINE  01/14/2023 1922    TEST NOT REPORTED DUE TO COLOR INTERFERENCE OF URINE PIGMENT   GLUCOSEU (A) 01/14/2023 1922    TEST NOT REPORTED DUE TO COLOR INTERFERENCE OF URINE PIGMENT   HGBUR (A) 01/14/2023 1922    TEST NOT REPORTED DUE TO COLOR INTERFERENCE OF URINE PIGMENT   BILIRUBINUR (A) 01/14/2023 1922    TEST NOT REPORTED DUE TO COLOR INTERFERENCE OF URINE PIGMENT   BILIRUBINUR negative 01/23/2021 1100   BILIRUBINUR neg 12/06/2013 1400   KETONESUR (A) 01/14/2023 1922    TEST NOT REPORTED DUE TO COLOR INTERFERENCE OF URINE PIGMENT   PROTEINUR (A) 01/14/2023 1922    TEST NOT REPORTED DUE TO COLOR INTERFERENCE OF URINE PIGMENT   UROBILINOGEN 0.2 01/23/2021 1100   NITRITE (A) 01/14/2023 1922    TEST NOT REPORTED DUE TO COLOR INTERFERENCE OF URINE PIGMENT   LEUKOCYTESUR (A) 01/14/2023 1922    TEST NOT REPORTED DUE TO COLOR INTERFERENCE OF URINE PIGMENT   Sepsis  Labs: @LABRCNTIP (procalcitonin:4,lacticidven:4) ) Recent Results (from the past 240 hour(s))  Urine Culture     Status: Abnormal (Preliminary result)   Collection Time: 01/14/23  7:22 PM   Specimen: Urine, Catheterized  Result Value Ref Range Status   Specimen Description   Final    URINE, CATHETERIZED Performed at [facility], [name], [place], [place] [address]    Special Requests   Final    NONE Reflexed from [id number] Performed at [facility], [address], [place], [place] [address]    Culture (  A)  Final    >=100,000 COLONIES/mL ESCHERICHIA COLI SUSCEPTIBILITIES TO FOLLOW Performed at [facility], [address] [place], [place] [address]    Report Status PENDING  Incomplete     Radiological Exams on Admission: CT ABDOMEN PELVIS WO CONTRAST  Result Date: 01/17/2023 CLINICAL DATA:  Gross hematuria EXAM: CT ABDOMEN AND PELVIS WITHOUT CONTRAST TECHNIQUE: Multidetector CT imaging of the abdomen and pelvis was performed following the standard protocol without IV contrast. RADIATION DOSE REDUCTION: This exam was performed according to the departmental dose-optimization program which includes automated exposure control, adjustment of the mA and/or kV according to patient size and/or use of iterative reconstruction technique. COMPARISON:  02/26/2021 FINDINGS: Lower chest: No acute abnormality. Hepatobiliary: No focal liver abnormality is seen. No gallstones, gallbladder wall thickening, or biliary dilatation. Pancreas: Unremarkable Spleen: Unremarkable Adrenals/Urinary Tract: The adrenal glands are unremarkable. The kidneys are normal in size and position. Endo and exophytic simple cortical cyst is seen within the lower pole of the left kidney, stable since prior examination, and for which no follow-up imaging is recommended. The kidneys are otherwise unremarkable. Foley catheter balloon seen within a decompressed bladder lumen. Mild perivesicular  inflammatory changes are present, best appreciated on coronal imaging, possibly reflecting changes of a superimposed infectious or inflammatory process. Stomach/Bowel: Pancolonic diverticulosis, severe within the distal segment, without superimposed acute inflammatory change. The stomach, small bowel, and large bowel are otherwise unremarkable. Appendix normal. No free intraperitoneal gas or fluid. Vascular/Lymphatic: Mild aortoiliac atherosclerotic calcification. No aortic aneurysm. No pathologic adenopathy within the abdomen and pelvis. Reproductive: Fiducial markers are seen within the prostate gland. Mild prostatic enlargement. There is mild periprostatic inflammatory changes identified, similar to the adjacent bladder which may reflect changes of cystoprostatitis. Other: Umbilical hernia repair has been performed. No recurrent abdominal wall hernia. Musculoskeletal: Degenerative changes are seen within the lumbar spine. No acute bone abnormality. No lytic or blastic bone lesion. IMPRESSION: 1. Foley catheter balloon within a decompressed bladder lumen. Mild perivesicular and periprostatic inflammatory changes are present possibly reflecting changes of superimposed cystoprostatitis. 2. Pancolonic diverticulosis, severe within the distal segment, without superimposed acute inflammatory change. 3. Aortic atherosclerosis. Aortic Atherosclerosis (ICD10-I70.0). Electronically Signed   By: Helyn Numbers M.D.   On: 01/17/2023 00:58     Assessment/Plan  This is a 72 y.o. male with a history of allergies, arthritis, BPH, kidney stones, prostate cancer, radiation cystitis, hypertension, hyperlipidemia now being admitted with:  #.  Urinary retention with hematuria -Admit observation - Continuous bladder irrigation per urology - Urology following -Monitor CBC  #.  History of hypertension - Continue Zestoretic  #.  History of hyperlipidemia - Continue rosuvastatin  Admission status: Observation IV  Fluids: Hep-Lock Diet/Nutrition: Heart healthy Consults called: Urology DVT Px: Lovenox, SCDs and early ambulation. Code Status: Full Code  Disposition Plan: To home in less than 24-hours  All the records are reviewed and case discussed with ED provider. Management plans discussed with the patient and/or family who express understanding and agree with plan of care.    D.O. on 01/17/2023 at 1:20 AM CC: Primary care physician; Georgina Quint, MD   01/17/2023, 1:20 AM

## 2023-01-17 NOTE — ED Notes (Signed)
Urologist is at bedside 

## 2023-01-17 NOTE — ED Notes (Signed)
Emptied of bloody fluid from foley, 3000 bag in which is the second bag.

## 2023-01-17 NOTE — Consult Note (Signed)
Urology Consult     Reason for consult: Hematuria and clot retention  History of Present Illness: [name] [name] is a 72 y.o. male who presents tonight for hematuria with clot retention.  Patient is known to our clinic for a history of radiation cystitis.  He underwent hyperbaric oxygen treatment last year.  He has been seen several times over the last few days for hematuria and retention and was seen in our clinic earlier today for this.  He [address] [place] three-way was placed previously.  Fortunately catheter stopped draining again around 5:00 today and return to the emergency department  At the time of my exam the patient reports he has had several clots irrigated out by ED staff.  CBI is connected and his urine was clear on a high rate.   Past Medical History:  Diagnosis Date   Allergy    seasonal allergies   Arthritis    generalized   Blood transfusion without reported diagnosis 2013   when had diverticulitis flare   BPH (benign prostatic hypertrophy)    Colon polyp    diverticular bleed with transfusion   Diverticulosis    History of kidney stones    Hyperlipidemia    on meds   Hypertension    on meds   Kidney stones    hx of 15-20 kidney stones    Obesity, unspecified    Pre-diabetes    Prostate cancer (HCC) 2020   dx 05/2019   Prostate nodule 07/27/2008    Past Surgical History:  Procedure Laterality Date   ANKLE FUSION Left 1991   COLONOSCOPY  2011   DB-F/V-miralax(good)-TICS/3 HPP   CYSTOSCOPY W/ RETROGRADES Bilateral 08/02/2022   Procedure: CYSTOSCOPY WITH BILATERAL RETROGRADE PYELOGRAM;  Surgeon: Crista Elliot, MD;  Location: [facility];  Service: Urology;  Laterality: Bilateral;   HERNIA REPAIR N/A    umbilical    POLYPECTOMY  2011   3 HPP   TOTAL KNEE ARTHROPLASTY Right 03/15/2022   Procedure: TOTAL KNEE ARTHROPLASTY;  Surgeon: Frederico Hamman, MD;  Location: [facility];  Service: Orthopedics;  Laterality: Right;   TRANSURETHRAL RESECTION OF BLADDER TUMOR N/A  08/02/2022   Procedure: TRANSURETHRAL RESECTION OF BLADDER TUMOR (TURBT);  Surgeon: Crista Elliot, MD;  Location: [facility];  Service: Urology;  Laterality: N/A;  60 MINS   TRANSURETHRAL RESECTION OF PROSTATE  2020    Current Hospital Medications:  Home Meds:  No current facility-administered medications on file prior to encounter.   Current Outpatient Medications on File Prior to Encounter  Medication Sig Dispense Refill   acetaminophen (TYLENOL) 500 MG tablet Take 2 tablets (1,000 mg total) by mouth every 6 (six) hours as needed. (Patient not taking: Reported on 11/10/2022) 100 tablet 2   ALPRAZolam (XANAX) 0.5 MG tablet TAKE 1 TABLET BY MOUTH EVERY DAY AS NEEDED FOR ANXIETY (Patient not taking: Reported on 11/10/2022) 20 tablet 1   cephALEXin (KEFLEX) 500 MG capsule Take 1 capsule (500 mg total) by mouth 4 (four) times daily. 28 capsule 0   lisinopril-hydrochlorothiazide (ZESTORETIC) 20-12.5 MG tablet TAKE 1 TABLET BY MOUTH EVERY DAY 90 tablet 3   meloxicam (MOBIC) 15 MG tablet Take 15 mg by mouth daily.     oxyCODONE (OXY IR/ROXICODONE) 5 MG immediate release tablet Take one tab po q4-6hrs prn pain (Patient not taking: Reported on 11/10/2022) 40 tablet 0   rosuvastatin (CRESTOR) 10 MG tablet TAKE 1 TABLET BY MOUTH EVERY DAY (Patient taking differently: Take 10 mg by mouth daily.)  90 tablet 1   tiZANidine (ZANAFLEX) 4 MG tablet Take 1 tablet (4 mg total) by mouth 3 (three) times daily. (Patient not taking: Reported on 11/10/2022) 60 tablet 0     Scheduled Meds: Continuous Infusions:  sodium chloride irrigation     PRN Meds:.  Allergies:  Allergies  Allergen Reactions   Aspirin     REACTION: upsets stomach   Statins     REACTION: UPSETS STOMACH   Gadolinium Derivatives Nausea And Vomiting    Pt was given 20 ml multihance and became nauseated and vomited several times.     Family History  Problem Relation Age of Onset   Diabetes Mother    Breast cancer Mother 69   Prostate  cancer Father        patient did not have a relationship with his father but understands he had prostate ca   Colon polyps Father 57   Colon cancer Father 39   Diabetes Sister    Pancreatic cancer Neg Hx    Esophageal cancer Neg Hx    Stomach cancer Neg Hx    Rectal cancer Neg Hx     Social History:  reports that he has never smoked. He has never used smokeless tobacco. He reports that he does not drink alcohol and does not use drugs.  ROS: A complete review of systems was performed.  All systems are negative except for pertinent findings as noted.  Physical Exam:  Vital signs in last 24 hours: Temp:  [98.2 F (36.8 C)-98.3 F (36.8 C)] 98.2 F (36.8 C) (08/08 2145) Pulse Rate:  [64-85] 85 (08/08 2145) Resp:  [18] 18 (08/08 2145) BP: (134-135)/(75-83) 134/83 (08/08 2145) SpO2:  [97 %-99 %] 97 % (08/08 2145) Weight:  [135.6 kg] 135.6 kg (08/08 2205) Constitutional:  Alert and oriented, No acute distress Cardiovascular: Regular rate and rhythm Respiratory: Normal respiratory effort, Lungs clear bilaterally GI: Abdomen is soft, nontender, nondistended, no abdominal masses Neurologic: Grossly intact, no focal deficits Psychiatric: Normal mood and affect  Laboratory Data:  Recent Labs    01/14/23 1903 01/17/23 0030  WBC  --  7.4  HGB 11.9* 10.3*  HCT 35.0* 33.4*  PLT  --  157    Recent Labs    01/14/23 1903 01/17/23 0030  NA 140 135  K 4.2 3.9  CL 104 104  GLUCOSE 96 115*  BUN 23 25*  CALCIUM  --  9.1  CREATININE 1.20 1.11     Results for orders placed or performed during the hospital encounter of 01/16/23 (from the past 24 hour(s))  Basic metabolic panel     Status: Abnormal   Collection Time: 01/17/23 12:30 AM  Result Value Ref Range   Sodium 135 135 - 145 mmol/L   Potassium 3.9 3.5 - 5.1 mmol/L   Chloride 104 98 - 111 mmol/L   CO2 21 (L) 22 - 32 mmol/L   Glucose, Bld 115 (H) 70 - 99 mg/dL   BUN 25 (H) 8 - 23 mg/dL   Creatinine, Ser 2.72 0.61 - 1.24  mg/dL   Calcium 9.1 8.9 - 53.6 mg/dL   GFR, Estimated >64 >40 mL/min   Anion gap 10 5 - 15  CBC with Differential     Status: Abnormal   Collection Time: 01/17/23 12:30 AM  Result Value Ref Range   WBC 7.4 4.0 - 10.5 K/uL   RBC 4.01 (L) 4.22 - 5.81 MIL/uL   Hemoglobin 10.3 (L) 13.0 - 17.0 g/dL   HCT  33.4 (L) 39.0 - 52.0 %   MCV 83.3 80.0 - 100.0 fL   MCH 25.7 (L) 26.0 - 34.0 pg   MCHC 30.8 30.0 - 36.0 g/dL   RDW 16.1 (H) 09.6 - 04.5 %   Platelets 157 150 - 400 K/uL   nRBC 0.0 0.0 - 0.2 %   Neutrophils Relative % 72 %   Neutro Abs 5.3 1.7 - 7.7 K/uL   Lymphocytes Relative 18 %   Lymphs Abs 1.3 0.7 - 4.0 K/uL   Monocytes Relative 7 %   Monocytes Absolute 0.5 0.1 - 1.0 K/uL   Eosinophils Relative 3 %   Eosinophils Absolute 0.2 0.0 - 0.5 K/uL   Basophils Relative 0 %   Basophils Absolute 0.0 0.0 - 0.1 K/uL   Immature Granulocytes 0 %   Abs Immature Granulocytes 0.03 0.00 - 0.07 K/uL   Recent Results (from the past 240 hour(s))  Urine Culture     Status: Abnormal (Preliminary result)   Collection Time: 01/14/23  7:22 PM   Specimen: Urine, Catheterized  Result Value Ref Range Status   Specimen Description   Final    URINE, CATHETERIZED Performed at [facility], [address], [place], [place] [address]    Special Requests   Final    NONE Reflexed from [id number] Performed at [facility], [address] [place], [place] [address]    Culture (A)  Final    >=100,000 COLONIES/mL ESCHERICHIA COLI SUSCEPTIBILITIES TO FOLLOW Performed at [facility], [address], [place], [place] [address]    Report Status PENDING  Incomplete    Renal Function: Recent Labs    01/14/23 1903 01/17/23 0030  CREATININE 1.20 1.11   Estimated Creatinine Clearance: 90.7 mL/min (by C-G formula based on SCr of 1.11 mg/dL).  Radiologic Imaging: CT ABDOMEN PELVIS WO CONTRAST  Result Date: 01/17/2023 CLINICAL DATA:  Gross hematuria EXAM: CT ABDOMEN AND PELVIS  WITHOUT CONTRAST TECHNIQUE: Multidetector CT imaging of the abdomen and pelvis was performed following the standard protocol without IV contrast. RADIATION DOSE REDUCTION: This exam was performed according to the departmental dose-optimization program which includes automated exposure control, adjustment of the mA and/or kV according to patient size and/or use of iterative reconstruction technique. COMPARISON:  02/26/2021 FINDINGS: Lower chest: No acute abnormality. Hepatobiliary: No focal liver abnormality is seen. No gallstones, gallbladder wall thickening, or biliary dilatation. Pancreas: Unremarkable Spleen: Unremarkable Adrenals/Urinary Tract: The adrenal glands are unremarkable. The kidneys are normal in size and position. Endo and exophytic simple cortical cyst is seen within the lower pole of the left kidney, stable since prior examination, and for which no follow-up imaging is recommended. The kidneys are otherwise unremarkable. Foley catheter balloon seen within a decompressed bladder lumen. Mild perivesicular inflammatory changes are present, best appreciated on coronal imaging, possibly reflecting changes of a superimposed infectious or inflammatory process. Stomach/Bowel: Pancolonic diverticulosis, severe within the distal segment, without superimposed acute inflammatory change. The stomach, small bowel, and large bowel are otherwise unremarkable. Appendix normal. No free intraperitoneal gas or fluid. Vascular/Lymphatic: Mild aortoiliac atherosclerotic calcification. No aortic aneurysm. No pathologic adenopathy within the abdomen and pelvis. Reproductive: Fiducial markers are seen within the prostate gland. Mild prostatic enlargement. There is mild periprostatic inflammatory changes identified, similar to the adjacent bladder which may reflect changes of cystoprostatitis. Other: Umbilical hernia repair has been performed. No recurrent abdominal wall hernia. Musculoskeletal: Degenerative changes are seen  within the lumbar spine. No acute bone abnormality. No lytic  or blastic bone lesion. IMPRESSION: 1. Foley catheter balloon within a decompressed bladder lumen. Mild perivesicular and periprostatic inflammatory changes are present possibly reflecting changes of superimposed cystoprostatitis. 2. Pancolonic diverticulosis, severe within the distal segment, without superimposed acute inflammatory change. 3. Aortic atherosclerosis. Aortic Atherosclerosis (ICD10-I70.0). Electronically Signed   By: Helyn Numbers M.D.   On: 01/17/2023 00:58    I independently reviewed the above imaging studies.  Impression/Recommendation 72 year old male with history of radiation cystitis with hematuria and clot retention. --I hand irrigated him in the ED with return of 1 or 2 small clots and light pink urine.  I titrated on his CBI to a low medium rate and his urine was draining clear -- CT scan obtained in ED shows catheter in the correct place and does not show large clot burden in the bladder.  Subsequently I am optimistic with conservative treatment the patient's condition would improve --Hand irrigate as needed and titrate CBI to light pink -- Urology following  Irine Seal MD 01/17/2023, 1:07 AM  [facility]  Pager: [phone]

## 2023-01-17 NOTE — ED Notes (Signed)
3000 ml of normal saliene into the bladder and 3000 emptied from the foley bag.

## 2023-01-18 DIAGNOSIS — R339 Retention of urine, unspecified: Secondary | ICD-10-CM | POA: Diagnosis not present

## 2023-01-18 MED ORDER — OXYBUTYNIN CHLORIDE 5 MG PO TABS
5.0000 mg | ORAL_TABLET | Freq: Three times a day (TID) | ORAL | Status: AC | PRN
  Administered 2023-01-18: 5 mg via ORAL
  Filled 2023-01-18: qty 1

## 2023-01-18 NOTE — Plan of Care (Signed)
  Problem: Safety: Goal: Ability to remain free from injury will improve Outcome: Progressing   Problem: Pain Managment: Goal: General experience of comfort will improve Outcome: Progressing   Problem: Elimination: Goal: Will not experience complications related to urinary retention Outcome: Progressing   Problem: Coping: Goal: Level of anxiety will decrease Outcome: Progressing

## 2023-01-18 NOTE — Progress Notes (Signed)
[facility] PROGRESS NOTE  Patient: [name] [name] KGM:[id number]   PCP: Georgina Quint, MD DOB: Dec 16, 1950   DOA: 01/16/2023   DOS: 01/18/2023    Subjective: No acute complaint.  No nausea no vomiting.  Urine appears to be clearing from blood  Objective:  Vitals:   01/17/23 1901 01/17/23 2310 01/18/23 0325 01/18/23 1112  BP: (!) 140/71 134/77 125/73 119/72  Pulse: 62 60 61 62  Resp:  18 20 18   Temp: 98.6 F (37 C) 97.9 F (36.6 C) 97.6 F (36.4 C) 98.2 F (36.8 C)  TempSrc: Oral Oral Oral Axillary  SpO2: 100% 99% 99% 100%  Weight:      Height:       S1-S2 present. Bowel sound present. Nontender.  Assessment and plan: Hematuria due to cystitis and prostatitis. Continue with oral antibiotic.  Monitor.  Continue CBI per urology. Most likely will continue catheter on discharge.  Author: Lynden Oxford, MD [facility] 01/18/2023 5:51 PM   If 7PM-7AM, please contact night-coverage at [email or web address]

## 2023-01-18 NOTE — Progress Notes (Signed)
Subjective: Did well overnight. Flushed cathter this morning with some clots. Urine  pink on light drip CBI.   Objective: Vital signs in last 24 hours: Temp:  [97.6 F (36.4 C)-98.6 F (37 C)] 97.6 F (36.4 C) (08/10 0325) Pulse Rate:  [54-70] 61 (08/10 0325) Resp:  [18-20] 20 (08/10 0325) BP: (113-145)/(68-78) 125/73 (08/10 0325) SpO2:  [99 %-100 %] 99 % (08/10 0325)  Assessment/Plan:  Intake/Output from previous day: 08/09 0701 - 08/10 0700 In: 10854.2 [P.O.:340; I.V.:1364.2] Out: 46962 [Urine:10375]  Intake/Output this shift: Total I/O In: 281.4 [I.V.:281.4] Out: 1900 [Urine:1900]  Physical Exam:  General: Alert and oriented CV: No cyanosis Lungs: equal chest rise Abdomen: Soft, NTND, no rebound or guarding Skin: Gu: 3 way in place with pink urine after flushing, on slow drip CBI  Lab Results: Recent Labs    01/17/23 0030 01/18/23 0414  HGB 10.3* 10.3*  HCT 33.4* 33.1*   BMET Recent Labs    01/17/23 0030 01/18/23 0414  NA 135 139  K 3.9 4.0  CL 104 104  CO2 21* 26  GLUCOSE 115* 109*  BUN 25* 19  CREATININE 1.11 1.01  CALCIUM 9.1 9.5     Studies/Results: CT ABDOMEN PELVIS WO CONTRAST  Result Date: 01/17/2023 CLINICAL DATA:  Gross hematuria EXAM: CT ABDOMEN AND PELVIS WITHOUT CONTRAST TECHNIQUE: Multidetector CT imaging of the abdomen and pelvis was performed following the standard protocol without IV contrast. RADIATION DOSE REDUCTION: This exam was performed according to the departmental dose-optimization program which includes automated exposure control, adjustment of the mA and/or kV according to patient size and/or use of iterative reconstruction technique. COMPARISON:  02/26/2021 FINDINGS: Lower chest: No acute abnormality. Hepatobiliary: No focal liver abnormality is seen. No gallstones, gallbladder wall thickening, or biliary dilatation. Pancreas: Unremarkable Spleen: Unremarkable Adrenals/Urinary Tract: The adrenal glands are unremarkable.  The kidneys are normal in size and position. Endo and exophytic simple cortical cyst is seen within the lower pole of the left kidney, stable since prior examination, and for which no follow-up imaging is recommended. The kidneys are otherwise unremarkable. Foley catheter balloon seen within a decompressed bladder lumen. Mild perivesicular inflammatory changes are present, best appreciated on coronal imaging, possibly reflecting changes of a superimposed infectious or inflammatory process. Stomach/Bowel: Pancolonic diverticulosis, severe within the distal segment, without superimposed acute inflammatory change. The stomach, small bowel, and large bowel are otherwise unremarkable. Appendix normal. No free intraperitoneal gas or fluid. Vascular/Lymphatic: Mild aortoiliac atherosclerotic calcification. No aortic aneurysm. No pathologic adenopathy within the abdomen and pelvis. Reproductive: Fiducial markers are seen within the prostate gland. Mild prostatic enlargement. There is mild periprostatic inflammatory changes identified, similar to the adjacent bladder which may reflect changes of cystoprostatitis. Other: Umbilical hernia repair has been performed. No recurrent abdominal wall hernia. Musculoskeletal: Degenerative changes are seen within the lumbar spine. No acute bone abnormality. No lytic or blastic bone lesion. IMPRESSION: 1. Foley catheter balloon within a decompressed bladder lumen. Mild perivesicular and periprostatic inflammatory changes are present possibly reflecting changes of superimposed cystoprostatitis. 2. Pancolonic diverticulosis, severe within the distal segment, without superimposed acute inflammatory change. 3. Aortic atherosclerosis. Aortic Atherosclerosis (ICD10-I70.0). Electronically Signed   By: Helyn Numbers M.D.   On: 01/17/2023 00:58    A/P A: 72 Y.o male with radiation cystitis who presents with clot retention, doing well with 3 way catheter in place. Urine improving on CBI.    Plan:  -continue slow drip CBI -Urology will continue to follow    LOS:  1 day  Jerald Kief, MD, PhD [facility] Resident  [facility]    01/18/2023, 10:46 AM

## 2023-01-19 ENCOUNTER — Telehealth (HOSPITAL_BASED_OUTPATIENT_CLINIC_OR_DEPARTMENT_OTHER): Payer: Self-pay | Admitting: *Deleted

## 2023-01-19 DIAGNOSIS — R339 Retention of urine, unspecified: Secondary | ICD-10-CM | POA: Diagnosis not present

## 2023-01-19 MED ORDER — OXYBUTYNIN CHLORIDE 5 MG/5ML PO SOLN
5.0000 mg | Freq: Once | ORAL | Status: AC
  Administered 2023-01-19: 5 mg via ORAL
  Filled 2023-01-19: qty 5

## 2023-01-19 MED ORDER — TRANEXAMIC ACID-NACL 1000-0.7 MG/100ML-% IV SOLN
1000.0000 mg | Freq: Once | INTRAVENOUS | Status: AC
  Administered 2023-01-19: 1000 mg via INTRAVENOUS
  Filled 2023-01-19: qty 100

## 2023-01-19 NOTE — Progress Notes (Signed)
     Subjective: No acute events overnight.  No new labs this morning.  Urine clear on moderate drip CBI.  CBI stopped this morning.  Patient given a dose of TXA.  Objective: Vital signs in last 24 hours: Temp:  [97.7 F (36.5 C)-98.2 F (36.8 C)] 97.7 F (36.5 C) (08/11 0526) Pulse Rate:  [58-69] 58 (08/11 0526) Resp:  [14-18] 14 (08/11 0526) BP: (119-130)/(66-74) 130/74 (08/11 0526) SpO2:  [100 %] 100 % (08/11 0526)  Assessment/Plan:  72 Y.o male with radiation cystitis who presents with clot retention, doing well with 3 way catheter in place. Urine improving on CBI. CBI stopped today.    Plan:  -Patient given a dose of TXA today -Plan to monitor urine off CBI.  Please page urology if urine becomes cherry red or thick with clots. -If patient able to remain off of CBI, okay to discharge from urologic perspective.  Intake/Output from previous day: 08/10 0701 - 08/11 0700 In: 12884.4 [P.O.:600; I.V.:284.4] Out: 28413 [Urine:14350]  Intake/Output this shift: Total I/O In: 343 [P.O.:240; I.V.:3; IV Piggyback:100] Out: 1000 [Urine:1000]  Physical Exam:  General: Alert and oriented CV: No cyanosis Lungs: equal chest rise Abdomen: Soft, NTND, no rebound or guarding Skin: Gu: Three-way catheter in place with clear urine on slow to moderate drip.  Lab Results: Recent Labs    01/17/23 0030 01/18/23 0414  HGB 10.3* 10.3*  HCT 33.4* 33.1*   BMET Recent Labs    01/17/23 0030 01/18/23 0414  NA 135 139  K 3.9 4.0  CL 104 104  CO2 21* 26  GLUCOSE 115* 109*  BUN 25* 19  CREATININE 1.11 1.01  CALCIUM 9.1 9.5     Studies/Results: No results found.    LOS: 2 days   Elmon Kirschner, NP [facility] Pager: [phone]  01/19/2023, 9:21 AM

## 2023-01-19 NOTE — Progress Notes (Signed)
[facility] PROGRESS NOTE  Patient: Notorious Dorow ZOX:[id number]   PCP: Georgina Quint, MD DOB: 1951-02-14   DOA: 01/16/2023   DOS: 01/19/2023    Subjective: No nausea no vomiting no fever no chills.  Received tranexamic acid by urology.  Still has blood in the catheter bag.  Objective:  Vitals:   01/18/23 1112 01/18/23 2024 01/19/23 0526 01/19/23 1131  BP: 119/72 129/66 130/74 122/73  Pulse: 62 69 (!) 58 74  Resp: 18 18 14 20   Temp: 98.2 F (36.8 C) 98.1 F (36.7 C) 97.7 F (36.5 C) 97.8 F (36.6 C)  TempSrc: Axillary Oral Oral Oral  SpO2: 100% 100% 100% 99%  Weight:      Height:       Bowel sound present. S1-S2 present per No edema.  Assessment and plan: Hematuria with cystitis and prostatitis. Continue oral antibiotic. Holding CBI per urology. Received TXA.  Monitor.  Author: Lynden Oxford, MD [facility] 01/19/2023 7:12 PM   If 7PM-7AM, please contact night-coverage at [email or web address]

## 2023-01-19 NOTE — Telephone Encounter (Signed)
Post ED Visit - Positive Culture Follow-up  Culture report reviewed by antimicrobial stewardship pharmacist: [facility] []  Enzo Bi, Pharm.D. []  Celedonio Miyamoto, Pharm.D., BCPS AQ-ID []  Garvin Fila, Pharm.D., BCPS []  Georgina Pillion, [address].D., BCPS []  [place], [address].D., BCPS, AAHIVP []  Estella Husk, Pharm.D., BCPS, AAHIVP []  Lysle Pearl, PharmD, BCPS []  Phillips Climes, PharmD, BCPS []  Agapito Games, PharmD, BCPS []  Verlan Friends, PharmD []  Mervyn Gay, PharmD, BCPS []  Vinnie Level, PharmD  [facility] []  Len Childs, PharmD []  Greer Pickerel, PharmD []  Adalberto Cole, PharmD []  Perlie Gold, Rph []  Lonell Face) Jean Rosenthal, PharmD []  Earl Many, PharmD []  Junita Push, PharmD []  Dorna Leitz, PharmD []  Terrilee Files, PharmD []  Lynann Beaver, PharmD []  Keturah Barre, PharmD []  Loralee Pacas, PharmD [x]  Tacy Learn, PharmD   Positive urine culture Treated with Cephalexin, organism sensitive to the same and no further patient follow-up is required at this time.  Patsey Berthold 01/19/2023, 8:55 AM

## 2023-01-19 NOTE — Plan of Care (Signed)
  Problem: Safety: Goal: Ability to remain free from injury will improve Outcome: Progressing   Problem: Pain Managment: Goal: General experience of comfort will improve Outcome: Progressing   Problem: Elimination: Goal: Will not experience complications related to urinary retention Outcome: Progressing   Problem: Coping: Goal: Level of anxiety will decrease Outcome: Progressing

## 2023-01-19 NOTE — Progress Notes (Addendum)
Patient was alert, lying in bed with his daughter, at bedside. I provided AD paperwork, pt asked for time to review it with his daughter and wife who will be here later today. Follow up needed to answer any questions they may have and to complete paperwork.  [address] [place]   01/19/23 1615  Spiritual Encounters  Type of Visit Initial  Care provided to: Patient;Family  Referral source Physician  Reason for visit Advance directives  OnCall Visit Yes  Spiritual Framework  Presenting Themes Goals in life/care  Community/Connection Family  Patient Stress Factors Health changes  Interventions  Spiritual Care Interventions Made Established relationship of care and support;Compassionate presence;Decision-making support/facilitation  Intervention Outcomes  Outcomes Connection to spiritual care;Other (comment) (Provided AD paperwork for pt review)  Spiritual Care Plan  Spiritual Care Issues Still Outstanding Referring to oncoming chaplain for further support

## 2023-01-20 DIAGNOSIS — R339 Retention of urine, unspecified: Secondary | ICD-10-CM | POA: Diagnosis not present

## 2023-01-20 LAB — CBC WITH DIFFERENTIAL/PLATELET
Abs Immature Granulocytes: 0.02 10*3/uL (ref 0.00–0.07)
Basophils Absolute: 0 10*3/uL (ref 0.0–0.1)
Basophils Relative: 1 %
Eosinophils Absolute: 0.3 10*3/uL (ref 0.0–0.5)
Eosinophils Relative: 6 %
HCT: 33.8 % — ABNORMAL LOW (ref 39.0–52.0)
Hemoglobin: 10.3 g/dL — ABNORMAL LOW (ref 13.0–17.0)
Immature Granulocytes: 0 %
Lymphocytes Relative: 28 %
Lymphs Abs: 1.3 10*3/uL (ref 0.7–4.0)
MCH: 25.8 pg — ABNORMAL LOW (ref 26.0–34.0)
MCHC: 30.5 g/dL (ref 30.0–36.0)
MCV: 84.7 fL (ref 80.0–100.0)
Monocytes Absolute: 0.4 10*3/uL (ref 0.1–1.0)
Monocytes Relative: 9 %
Neutro Abs: 2.5 10*3/uL (ref 1.7–7.7)
Neutrophils Relative %: 56 %
Platelets: 177 10*3/uL (ref 150–400)
RBC: 3.99 MIL/uL — ABNORMAL LOW (ref 4.22–5.81)
RDW: 16.5 % — ABNORMAL HIGH (ref 11.5–15.5)
WBC: 4.5 10*3/uL (ref 4.0–10.5)
nRBC: 0 % (ref 0.0–0.2)

## 2023-01-20 LAB — BASIC METABOLIC PANEL
Anion gap: 8 (ref 5–15)
BUN: 19 mg/dL (ref 8–23)
CO2: 27 mmol/L (ref 22–32)
Calcium: 9.2 mg/dL (ref 8.9–10.3)
Chloride: 102 mmol/L (ref 98–111)
Creatinine, Ser: 1.13 mg/dL (ref 0.61–1.24)
GFR, Estimated: >60 mL/min (ref >60)
Glucose, Bld: 95 mg/dL (ref 70–99)
Potassium: 3.9 mmol/L (ref 3.5–5.1)
Sodium: 137 mmol/L (ref 135–145)

## 2023-01-20 MED ORDER — ORAL CARE MOUTH RINSE: 15.0000 mL | OROMUCOSAL | Status: DC | PRN

## 2023-01-20 MED ORDER — CEPHALEXIN 500 MG PO CAPS: 500.0000 mg | ORAL_CAPSULE | Freq: Four times a day (QID) | ORAL | 0 refills | Status: AC

## 2023-01-20 NOTE — Plan of Care (Signed)
  Problem: Safety: Goal: Ability to remain free from injury will improve Outcome: Progressing   Problem: Skin Integrity: Goal: Risk for impaired skin integrity will decrease Outcome: Progressing   Problem: Pain Managment: Goal: General experience of comfort will improve Outcome: Progressing   Problem: Coping: Goal: Level of anxiety will decrease Outcome: Progressing   

## 2023-01-20 NOTE — Progress Notes (Signed)
Discharge education provided to patient and wife, all questions answered. Per urology, education provided on flushing foley catheter as needed and to make follow up appointment as soon as possible.  PIV removed, all belongings returned. Patient assisted to main entrance via wheelchair and assisted into vehicle safely.

## 2023-01-20 NOTE — Progress Notes (Addendum)
     Subjective: NAEON. Pt has been off CBI for the past 24 hours. Urine today strawberry but thin. Encouraging PO water intake to help flush urine.   Hg stable at 10 today.   Objective: Vital signs in last 24 hours: Temp:  [97.[name] F (36.6 C)-98.5 F (36.[name] C)] 97.[name] F (36.6 C) (08/12 0925) Pulse Rate:  [59-67] 59 (08/12 0925) Resp:  [17-19] 19 (08/12 0925) BP: (119-146)/(67-88) 119/67 (08/12 0925) SpO2:  [100 %] 100 % (08/12 0925)  Assessment/Plan:  72 Y.o [name] with radiation cystitis who presents with clot retention, doing well with 3 way catheter in place. Urine stable off CBI.    Plan:  -Ok to discharge with foley catheter in place.  -Plan for TOV on Wednesday or Thursday this week.  -encourage patient to drink plenty of fluids at home.  -will teach family to flush foley catheter at home if needed. Minimal clot today.  Intake/Output from previous day: 08/11 0701 - 08/12 0700 In: 856 [P.O.:600; I.V.:6; IV Piggyback:100] Out: 4350 [Urine:4350]  Intake/Output this shift: Total I/O In: 180 [Other:180] Out: 725 [Urine:725]  Physical Exam:  General: Alert and oriented CV: No cyanosis Lungs: equal chest rise Abdomen: Soft, NTND, no rebound or guarding Skin: Gu: foley in place, strawberry colored and thin.   Lab Results: Recent Labs    01/18/23 0414 01/20/23 0812  HGB 10.3* 10.3*  HCT 33.1* 33.8*   BMET Recent Labs    01/18/23 0414 01/20/23 0812  NA 139 137  K 4.0 3.[name]  CL 104 102  CO2 26 27  GLUCOSE 109* 95  BUN 19 19  CREATININE 1.01 1.13  CALCIUM [name].5 [name].2     Studies/Results: No results found.    LOS: 3 days   Jerald Kief, MD, PhD [facility] Resident  [facility]    01/20/2023, 2:14 PM

## 2023-01-20 NOTE — Progress Notes (Signed)
Mobility Specialist - Progress Note   01/20/23 1340  Mobility  Activity Ambulated with assistance in hallway  Level of Assistance Standby assist, set-up cues, supervision of patient - no hands on  Assistive Device Front wheel walker  Distance Ambulated (ft) 160 ft  Activity Response Tolerated well  Mobility Referral Yes  $Mobility charge 1 Mobility  Mobility Specialist Start Time (ACUTE ONLY) 0129  Mobility Specialist Stop Time (ACUTE ONLY) 0139  Mobility Specialist Time Calculation (min) (ACUTE ONLY) 10 min   Pt received in bed and agreeable to mobility. Pt L drags some due to swelling. No other complaints during session. Pt to EOB after session with all needs met & wife in room.   [facility]

## 2023-01-20 NOTE — Progress Notes (Signed)
Chaplain provided Advanced Directive, Healthcare POA, education and support. [name] [name] and his wife are looking over the document and continuing to have conversations around it. Chaplain let them know how to contact her when they are ready to sign and notarize it.     01/20/23 1000  Spiritual Encounters  Type of Visit Follow up  Reason for visit Advance directives  Spiritual Framework  Presenting Themes Goals in life/care

## 2023-01-21 ENCOUNTER — Telehealth: Payer: Self-pay | Admitting: *Deleted

## 2023-01-21 ENCOUNTER — Encounter: Payer: Self-pay | Admitting: *Deleted

## 2023-01-21 ENCOUNTER — Telehealth: Payer: Self-pay

## 2023-01-21 NOTE — Transitions of Care (Post Inpatient/ED Visit) (Signed)
01/21/2023  Name: [name] [name] MRN: [id number] DOB: [birth year]  Today's TOC FU Call Status: Today's TOC FU Call Status:: Successful TOC FU Call Completed TOC FU Call Complete Date: 01/21/23  Transition Care Management Follow-up Telephone Call Date of Discharge: 01/20/23 Discharge Facility: [facility]) Type of Discharge: Inpatient Admission Primary Inpatient Discharge Diagnosis:: Urinary retention/ hematuria; foley catheter obstruction; urinary cystitis How have you been since you were released from the hospital?: Better ("I am doing okay, I just wish the doctors could figure out why this is happening so much.  I don't like having to go to the ER all the time.  At least this time, they kept me in the hospital and tried to do somethng to correct the issues") Any questions or concerns?: No  Items Reviewed: Did you receive and understand the discharge instructions provided?: Yes (thoroughly reviewed with patient who verbalizes good understanding of same) Medications obtained,verified, and reconciled?: Yes (Medications Reviewed) (Full medication reconciliation/ review completed; no concerns or discrepancies identified; self-manages medications and denies questions/ concerns around medications today) Any new allergies since your discharge?: No Dietary orders reviewed?: Yes Type of Diet Ordered:: "Healthy as possible" Do you have support at home?: Yes People in Home: spouse Name of Support/Comfort Primary Source: Reports independent in self-care activities; supportive spouse assists as/ if needed/ indicated  Medications Reviewed Today: Medications Reviewed Today     Reviewed by Michaela Corner, RN (Registered Nurse) on 01/21/23 at 1616  Med List Status: <None>   Medication Order Taking? Sig Documenting Provider Last Dose Status Informant  acetaminophen (TYLENOL) 500 MG tablet [id number] Yes Take 2 tablets (1,000 mg total) by mouth every 6 (six) hours as needed. Margart Sickles, PA-C  Taking Active Self, Pharmacy Records  cephALEXin (KEFLEX) 500 MG capsule [id number] Yes Take 1 capsule (500 mg total) by mouth 4 (four) times daily for 7 days. Rolly Salter, MD Taking Active   lisinopril-hydrochlorothiazide (ZESTORETIC) 20-12.5 MG tablet [id number] Yes TAKE 1 TABLET BY MOUTH EVERY DAY  Patient taking differently: Take 1 tablet by mouth daily.   Georgina Quint, MD Taking Active Self, Pharmacy Records  oxybutynin [facility]) 5 MG tablet [id number] Yes Take 5 mg by mouth daily. [provider] Taking Active Self, Pharmacy Records  oxyCODONE (OXY IR/ROXICODONE) 5 MG immediate release tablet [id number] No Take one tab po q4-6hrs prn pain  Patient not taking: Reported on 11/10/2022   Margart Sickles, PA-C Not Taking Active Self, Pharmacy Records  rosuvastatin (CRESTOR) 10 MG tablet [id number] Yes TAKE 1 TABLET BY MOUTH EVERY DAY  Patient taking differently: Take 10 mg by mouth daily.   Georgina Quint, MD Taking Active Self, Pharmacy Records  tamsulosin [facility]) 0.4 MG CAPS capsule [id number] Yes Take 0.8 mg by mouth daily. [provider] Taking Active Self, Pharmacy Records           Home Care and Equipment/Supplies: Were Home Health Services Ordered?: No Any new equipment or medical supplies ordered?: No  Functional Questionnaire: Do you need assistance with bathing/showering or dressing?: No Do you need assistance with meal preparation?: No Do you need assistance with eating?: No Do you have difficulty maintaining continence: No Do you need assistance with getting out of bed/getting out of a chair/moving?: No Do you have difficulty managing or taking your medications?: No  Follow up appointments reviewed: PCP Follow-up appointment confirmed?: Yes Date of PCP follow-up appointment?: 01/28/23 Follow-up Provider: PCP Specialist Hospital Follow-up appointment confirmed?: Yes (care coordination outreach in real-time with scheduling  care guide to  successfully schedule hospital follow up PCP appointment 01/28/23) Date of Specialist follow-up appointment?: 01/30/23 Follow-Up Specialty Provider:: Urology provider Do you need transportation to your follow-up appointment?: No Do you understand care options if your condition(s) worsen?: Yes-patient verbalized understanding  SDOH Interventions Today    Flowsheet Row Most Recent Value  SDOH Interventions   Food Insecurity Interventions Intervention Not Indicated  Transportation Interventions Intervention Not Indicated  [drives self]      TOC Interventions Today    Flowsheet Row Most Recent Value  TOC Interventions   TOC Interventions Discussed/Reviewed TOC Interventions Discussed, Arranged PCP follow up within 7 days/Care Guide scheduled  [Patient declines need for ongoing/ further care coordination outreach,  no care coordination needs identified at time of TOC call today,  provided my direct contact information should questions/ concerns/ needs arise post-TOC call]      Interventions Today    Flowsheet Row Most Recent Value  Chronic Disease   Chronic disease during today's visit Other  [chronic urinary retention,  hematuria,  cystitis,  foley catheter obstruction]  General Interventions   General Interventions Discussed/Reviewed General Interventions Discussed, Doctor Visits, Durable Medical Equipment (DME)  Doctor Visits Discussed/Reviewed PCP, Specialist, Doctor Visits Discussed  Durable Medical Equipment (DME) Other  [confirmed currently requiring/ using assistive devices - cane]  PCP/Specialist Visits Compliance with follow-up visit  Education Interventions   Education Provided Provided Education  Provided Verbal Education On Other  [reinforced care and maintenance of foley catheter]  Nutrition Interventions   Nutrition Discussed/Reviewed Nutrition Discussed  Pharmacy Interventions   Pharmacy Dicussed/Reviewed Pharmacy Topics Discussed  [Full medication review with  updating medication list in EHR per patient report]      Caryl Pina, RN, BSN, CCRN Alumnus RN CM Care Coordination/ Transition of Care- [facility] Care Management [phone]: direct office

## 2023-01-21 NOTE — Telephone Encounter (Signed)
Transition Care Management Unsuccessful Follow-up Telephone Call  Date of discharge and from where:  [facility] 8[facility]  Attempts:  1st Attempt  Reason for unsuccessful TCM follow-up call:  No answer/busy   Lenard Forth [facility] Guide, [facility] [phone] [address] [place], [place], [place] [address] Phone: [phone] Email: [email or web address]

## 2023-01-21 NOTE — Discharge Summary (Signed)
Physician Discharge Summary   Patient: [name] [name] MRN: [id number] DOB: Aug 22, 1950  Admit date:     01/16/2023  Discharge date: 01/20/2023  Discharge Physician: Lynden Oxford  PCP: Georgina Quint, MD  Recommendations at discharge: Follow-up with urology.   Follow-up Information     Georgina Quint, MD Follow up.   Specialty: Internal Medicine Why: with CBC lab to look at blood counts, with BMP lab to look at kidney and electrolytes Contact information: [address] [place] [address] [phone]         [facility]. Schedule an appointment as soon as possible for a visit in 1 week(s).   Contact information: [address] [place] [address] [place] [address] [phone]               Discharge Diagnoses: Principal Problem:   Urinary retention Active Problems:   Gross hematuria   Obstruction of Foley catheter (HCC)   Hematuria due to acute cystitis  Hospital Course: UTI secondary to E. Coli Cystitis and prostatitis Hematuria Presents to the ED on 8/6 with complaints of UTI. Started on oral Keflex. Presents to the ED on 8/5 with blood in the urine. Foley catheter inserted. Started on CBI. Urology was consulted. Recommend to continue Foley catheter on discharge with plans to have gross follow-up in clinic. Will continue antibiotic for at least 7 more days. Outpatient follow-up with urology recommended where they can decide on further prolonging the duration of the antibiotic especially in the setting of concern for prostatitis.  Hypertension. Continue home regimen.  HLD prevention continue statin.  Obesity Body mass index is 38.39 kg/m.  Placing the pt at higher risk of poor outcomes.  Consultants:  Urology  Procedures performed:  CBI  DISCHARGE MEDICATION: Allergies as of 01/20/2023       Reactions   Aspirin    REACTION: upsets stomach   Statins    REACTION: UPSETS STOMACH   Gadolinium Derivatives  Nausea And Vomiting   Pt was given 20 ml multihance and became nauseated and vomited several times.         Medication List     STOP taking these medications    ALPRAZolam 0.5 MG tablet Commonly known as: XANAX   meloxicam 15 MG tablet Commonly known as: MOBIC   tiZANidine 4 MG tablet Commonly known as: Zanaflex       TAKE these medications    acetaminophen 500 MG tablet Commonly known as: TYLENOL Take 2 tablets (1,000 mg total) by mouth every 6 (six) hours as needed.   cephALEXin 500 MG capsule Commonly known as: KEFLEX Take 1 capsule (500 mg total) by mouth 4 (four) times daily for 7 days.   lisinopril-hydrochlorothiazide 20-12.5 MG tablet Commonly known as: ZESTORETIC TAKE 1 TABLET BY MOUTH EVERY DAY   oxybutynin 5 MG tablet Commonly known as: DITROPAN Take 5 mg by mouth daily.   oxyCODONE 5 MG immediate release tablet Commonly known as: Oxy IR/ROXICODONE Take one tab po q4-6hrs prn pain   rosuvastatin 10 MG tablet Commonly known as: CRESTOR TAKE 1 TABLET BY MOUTH EVERY DAY   tamsulosin 0.4 MG Caps capsule Commonly known as: FLOMAX Take 0.8 mg by mouth daily.       Disposition: Home Diet recommendation: Cardiac diet  Discharge Exam: Vitals:   01/19/23 1954 01/20/23 0645 01/20/23 0925 01/20/23 1439  BP: 136/68 (!) 146/88 119/67 119/63  Pulse: 67 63 (!) 59 67  Resp: 17 17 19 20   Temp: 98.5  F (36.9 C) 98.2 F (36.8 C) 97.9 F (36.6 C) 98.1 F (36.7 C)  TempSrc: Oral Oral Oral Oral  SpO2: 100% 100% 100% 100%  Weight:      Height:       General: Appear in no distress; no visible Abnormal Neck Mass Or lumps, Conjunctiva normal Cardiovascular: S1 and S2 Present, no Murmur, Respiratory: good respiratory effort, Bilateral Air entry present and CTA, no Crackles, no wheezes Abdomen: Bowel Sound present, Non tender  Extremities: no Pedal edema Neurology: alert and oriented to time, place, and person  [facility] Weights   01/16/23 2205  Weight:  135.6 kg   Condition at discharge: stable  The results of significant diagnostics from this hospitalization (including imaging, microbiology, ancillary and laboratory) are listed below for reference.   Imaging Studies: CT ABDOMEN PELVIS WO CONTRAST  Result Date: 01/17/2023 CLINICAL DATA:  Gross hematuria EXAM: CT ABDOMEN AND PELVIS WITHOUT CONTRAST TECHNIQUE: Multidetector CT imaging of the abdomen and pelvis was performed following the standard protocol without IV contrast. RADIATION DOSE REDUCTION: This exam was performed according to the departmental dose-optimization program which includes automated exposure control, adjustment of the mA and/or kV according to patient size and/or use of iterative reconstruction technique. COMPARISON:  02/26/2021 FINDINGS: Lower chest: No acute abnormality. Hepatobiliary: No focal liver abnormality is seen. No gallstones, gallbladder wall thickening, or biliary dilatation. Pancreas: Unremarkable Spleen: Unremarkable Adrenals/Urinary Tract: The adrenal glands are unremarkable. The kidneys are normal in size and position. Endo and exophytic simple cortical cyst is seen within the lower pole of the left kidney, stable since prior examination, and for which no follow-up imaging is recommended. The kidneys are otherwise unremarkable. Foley catheter balloon seen within a decompressed bladder lumen. Mild perivesicular inflammatory changes are present, best appreciated on coronal imaging, possibly reflecting changes of a superimposed infectious or inflammatory process. Stomach/Bowel: Pancolonic diverticulosis, severe within the distal segment, without superimposed acute inflammatory change. The stomach, small bowel, and large bowel are otherwise unremarkable. Appendix normal. No free intraperitoneal gas or fluid. Vascular/Lymphatic: Mild aortoiliac atherosclerotic calcification. No aortic aneurysm. No pathologic adenopathy within the abdomen and pelvis. Reproductive: Fiducial  markers are seen within the prostate gland. Mild prostatic enlargement. There is mild periprostatic inflammatory changes identified, similar to the adjacent bladder which may reflect changes of cystoprostatitis. Other: Umbilical hernia repair has been performed. No recurrent abdominal wall hernia. Musculoskeletal: Degenerative changes are seen within the lumbar spine. No acute bone abnormality. No lytic or blastic bone lesion. IMPRESSION: 1. Foley catheter balloon within a decompressed bladder lumen. Mild perivesicular and periprostatic inflammatory changes are present possibly reflecting changes of superimposed cystoprostatitis. 2. Pancolonic diverticulosis, severe within the distal segment, without superimposed acute inflammatory change. 3. Aortic atherosclerosis. Aortic Atherosclerosis (ICD10-I70.0). Electronically Signed   By: Helyn Numbers M.D.   On: 01/17/2023 00:58    Microbiology: Results for orders placed or performed during the hospital encounter of 01/14/23  Urine Culture     Status: Abnormal   Collection Time: 01/14/23  7:22 PM   Specimen: Urine, Catheterized  Result Value Ref Range Status   Specimen Description   Final    URINE, CATHETERIZED Performed at [facility], [address], [place], [place] [address]    Special Requests   Final    NONE Reflexed from [id number] Performed at [facility], [address], [place], [place] [address]    Culture >=100,000 COLONIES/mL ESCHERICHIA COLI (A)  Final   Report Status 01/17/2023 FINAL  Final  Organism ID, Bacteria ESCHERICHIA COLI (A)  Final      Susceptibility   Escherichia coli - MIC*    AMPICILLIN >=32 RESISTANT Resistant     CEFAZOLIN <=4 SENSITIVE Sensitive     CEFEPIME <=0.12 SENSITIVE Sensitive     CEFTRIAXONE <=0.25 SENSITIVE Sensitive     CIPROFLOXACIN >=4 RESISTANT Resistant     GENTAMICIN <=1 SENSITIVE Sensitive     IMIPENEM <=0.25 SENSITIVE Sensitive     NITROFURANTOIN <=16 SENSITIVE Sensitive      TRIMETH/SULFA >=320 RESISTANT Resistant     AMPICILLIN/SULBACTAM 16 INTERMEDIATE Intermediate     PIP/TAZO <=4 SENSITIVE Sensitive     * >=100,000 COLONIES/mL ESCHERICHIA COLI   Labs: CBC: Recent Labs  Lab 01/14/23 1903 01/17/23 0030 01/18/23 0414 01/20/23 0812  WBC  --  7.4 4.9 4.5  NEUTROABS  --  5.3  --  2.5  HGB 11.9* 10.3* 10.3* 10.3*  HCT 35.0* 33.4* 33.1* 33.8*  MCV  --  83.3 84.9 84.7  PLT  --  157 176 177   Basic Metabolic Panel: Recent Labs  Lab 01/14/23 1903 01/17/23 0030 01/18/23 0414 01/20/23 0812  NA 140 135 139 137  K 4.2 3.9 4.0 3.9  CL 104 104 104 102  CO2  --  21* 26 27  GLUCOSE 96 115* 109* 95  BUN 23 25* 19 19  CREATININE 1.20 1.11 1.01 1.13  CALCIUM  --  9.1 9.5 9.2   Liver Function Tests: No results for input(s): "AST", "ALT", "ALKPHOS", "BILITOT", "PROT", "ALBUMIN" in the last 168 hours. CBG: No results for input(s): "GLUCAP" in the last 168 hours.  Discharge time spent: greater than 30 minutes.  Author: Lynden Oxford, MD  [facility] 01/20/2023

## 2023-01-28 ENCOUNTER — Ambulatory Visit: Payer: Medicare Other | Admitting: Emergency Medicine

## 2023-01-28 ENCOUNTER — Encounter: Payer: Self-pay | Admitting: Emergency Medicine

## 2023-01-28 VITALS — BP 104/60 | HR 69 | Temp 97.8°F | Ht 74.0 in | Wt 303.4 lb

## 2023-01-28 DIAGNOSIS — N390 Urinary tract infection, site not specified: Secondary | ICD-10-CM | POA: Diagnosis not present

## 2023-01-28 DIAGNOSIS — C61 Malignant neoplasm of prostate: Secondary | ICD-10-CM

## 2023-01-28 DIAGNOSIS — R339 Retention of urine, unspecified: Secondary | ICD-10-CM | POA: Diagnosis not present

## 2023-01-28 DIAGNOSIS — B9620 Unspecified Escherichia coli [E. coli] as the cause of diseases classified elsewhere: Secondary | ICD-10-CM | POA: Insufficient documentation

## 2023-01-28 DIAGNOSIS — I10 Essential (primary) hypertension: Secondary | ICD-10-CM

## 2023-01-28 DIAGNOSIS — R310 Gross hematuria: Secondary | ICD-10-CM

## 2023-01-28 DIAGNOSIS — E785 Hyperlipidemia, unspecified: Secondary | ICD-10-CM | POA: Diagnosis not present

## 2023-01-28 DIAGNOSIS — Z09 Encounter for follow-up examination after completed treatment for conditions other than malignant neoplasm: Secondary | ICD-10-CM

## 2023-01-28 DIAGNOSIS — K5790 Diverticulosis of intestine, part unspecified, without perforation or abscess without bleeding: Secondary | ICD-10-CM

## 2023-01-28 LAB — COMPREHENSIVE METABOLIC PANEL
ALT: 12 U/L (ref 0–53)
AST: 15 U/L (ref 0–37)
Albumin: 4.2 g/dL (ref 3.5–5.2)
Alkaline Phosphatase: 51 U/L (ref 39–117)
BUN: 24 mg/dL — ABNORMAL HIGH (ref 6–23)
CO2: 26 mEq/L (ref 19–32)
Calcium: 10 mg/dL (ref 8.4–10.5)
Chloride: 104 mEq/L (ref 96–112)
Creatinine, Ser: 1.18 mg/dL (ref 0.40–1.50)
GFR: 62.51 mL/min (ref >60.00)
Glucose, Bld: 107 mg/dL — ABNORMAL HIGH (ref 70–99)
Potassium: 4.3 mEq/L (ref 3.5–5.1)
Sodium: 137 mEq/L (ref 135–145)
Total Bilirubin: 0.3 mg/dL (ref 0.2–1.2)
Total Protein: 7.7 g/dL (ref 6.0–8.3)

## 2023-01-28 LAB — CBC WITH DIFFERENTIAL/PLATELET
Basophils Absolute: 0 10*3/uL (ref 0.0–0.1)
Basophils Relative: 0.7 % (ref 0.0–3.0)
Eosinophils Absolute: 0.3 10*3/uL (ref 0.0–0.7)
Eosinophils Relative: 5.3 % — ABNORMAL HIGH (ref 0.0–5.0)
HCT: 30.1 % — ABNORMAL LOW (ref 39.0–52.0)
Hemoglobin: 9.7 g/dL — ABNORMAL LOW (ref 13.0–17.0)
Lymphocytes Relative: 21.4 % (ref 12.0–46.0)
Lymphs Abs: 1.3 10*3/uL (ref 0.7–4.0)
MCHC: 32.3 g/dL (ref 30.0–36.0)
MCV: 80.8 fl (ref 78.0–100.0)
Monocytes Absolute: 0.4 10*3/uL (ref 0.1–1.0)
Monocytes Relative: 6.3 % (ref 3.0–12.0)
Neutro Abs: 4 10*3/uL (ref 1.4–7.7)
Neutrophils Relative %: 66.3 % (ref 43.0–77.0)
Platelets: 244 10*3/uL (ref 150.0–400.0)
RBC: 3.72 Mil/uL — ABNORMAL LOW (ref 4.22–5.81)
RDW: 17.2 % — ABNORMAL HIGH (ref 11.5–15.5)
WBC: 6 10*3/uL (ref 4.0–10.5)

## 2023-01-28 NOTE — Assessment & Plan Note (Signed)
Foley catheter draining well Continues tamsulosin 0.8 mg daily

## 2023-01-28 NOTE — Assessment & Plan Note (Signed)
Stable.  No acute diverticulitis. Accounting for some of the chronic anemia

## 2023-01-28 NOTE — Progress Notes (Signed)
[name] [name] 72 y.o.   Chief Complaint  Patient presents with   Hospitalization Follow-up    3 day stay. Wants to discuss his hemoglobin with you, patient states that it was low.     HISTORY OF PRESENT ILLNESS: This is a 72 y.o. male here for hospital discharge follow-up.  Admitted on 01/16/2023 and released on 01/20/2023 Admitting diagnosis: Urinary retention with gross hematuria Workup revealed E. coli UTI Has follow-up appointment with urologist in 2 days Doing much better today.  Continues to take Keflex.  Urinary catheter in place.  Draining well but still has some hematuria Hospital discharge summary as follows: Physician Discharge Summary    Patient: [name] [name] MRN: [id number] DOB: 1951/01/28  Admit date:     01/16/2023  Discharge date: 01/20/2023  Discharge Physician: Lynden Oxford  PCP: Georgina Quint, MD   Recommendations at discharge: Follow-up with urology.     Follow-up Information       Georgina Quint, MD Follow up.   Specialty: Internal Medicine Why: with CBC lab to look at blood counts, with BMP lab to look at kidney and electrolytes Contact information: [address] [place] [address] [phone]              [facility]. Schedule an appointment as soon as possible for a visit in 1 week(s).   Contact information: [address] [place] [address] [phone]                      Discharge Diagnoses: Principal Problem:   Urinary retention Active Problems:   Gross hematuria   Obstruction of Foley catheter (HCC)   Hematuria due to acute cystitis   Hospital Course: UTI secondary to E. Coli Cystitis and prostatitis Hematuria Presents to the ED on 8/6 with complaints of UTI. Started on oral Keflex. Presents to the ED on 8/5 with blood in the urine. Foley catheter inserted. Started on CBI. Urology was consulted. Recommend to continue Foley catheter on discharge with plans to have  gross follow-up in clinic. Will continue antibiotic for at least 7 more days. Outpatient follow-up with urology recommended where they can decide on further prolonging the duration of the antibiotic especially in the setting of concern for prostatitis.   Hypertension. Continue home regimen.   HLD prevention continue statin.   Obesity Body mass index is 38.39 kg/m.  Placing the pt at higher risk of poor outcomes.   Consultants:  Urology   Procedures performed:  CBI  HPI   Prior to Admission medications   Medication Sig Start Date End Date Taking? Authorizing Provider  acetaminophen (TYLENOL) 500 MG tablet Take 2 tablets (1,000 mg total) by mouth every 6 (six) hours as needed. 03/15/22 03/15/23 Yes Chadwell, Ivin Booty, PA-C  cephALEXin (KEFLEX) 500 MG capsule Take 500 mg by mouth 4 (four) times daily. 14 days   Yes [provider]  lisinopril-hydrochlorothiazide (ZESTORETIC) 20-12.5 MG tablet TAKE 1 TABLET BY MOUTH EVERY DAY Patient taking differently: Take 1 tablet by mouth daily. 11/16/22  Yes Lashauna Arpin, Eilleen Kempf, MD  oxybutynin (DITROPAN) 5 MG tablet Take 5 mg by mouth daily. 01/16/23  Yes [provider]  rosuvastatin (CRESTOR) 10 MG tablet TAKE 1 TABLET BY MOUTH EVERY DAY Patient taking differently: Take 10 mg by mouth daily. 08/18/22  Yes Shenna Brissette, Eilleen Kempf, MD  tamsulosin (FLOMAX) 0.4 MG CAPS capsule Take 0.8 mg by mouth daily. 01/03/23  Yes [provider]  oxyCODONE (  OXY IR/ROXICODONE) 5 MG immediate release tablet Take one tab po q4-6hrs prn pain Patient not taking: Reported on 11/10/2022 03/15/22   Margart Sickles, PA-C    Allergies  Allergen Reactions   Aspirin     REACTION: upsets stomach   Statins     REACTION: UPSETS STOMACH   Gadolinium Derivatives Nausea And Vomiting    Pt was given 20 ml multihance and became nauseated and vomited several times.     Patient Active Problem List   Diagnosis Date Noted   Urinary retention 01/17/2023    Gross hematuria 01/17/2023   Obstruction of Foley catheter (HCC) 01/17/2023   Hematuria due to acute cystitis 01/17/2023   S/P total knee arthroplasty, right 03/15/2022   Body mass index (BMI) of 38.0-38.9 in adult 01/10/2020   Diverticulosis 01/10/2020   Prediabetes 01/10/2020   Dyslipidemia 01/10/2020   Malignant neoplasm of prostate (HCC) 06/08/2019   Prostate enlargement 08/29/2017   Gout 10/25/2014   Essential hypertension    Benign prostatic hyperplasia    Kidney stones    Prostate nodule     Past Medical History:  Diagnosis Date   Allergy    seasonal allergies   Arthritis    generalized   Blood transfusion without reported diagnosis 2013   when had diverticulitis flare   BPH (benign prostatic hypertrophy)    Colon polyp    diverticular bleed with transfusion   Diverticulosis    History of kidney stones    Hyperlipidemia    on meds   Hypertension    on meds   Kidney stones    hx of 15-20 kidney stones    Obesity, unspecified    Pre-diabetes    Prostate cancer (HCC) 2020   dx 05/2019   Prostate nodule 07/27/2008    Past Surgical History:  Procedure Laterality Date   ANKLE FUSION Left 1991   COLONOSCOPY  2011   DB-F/V-miralax(good)-TICS/3 HPP   CYSTOSCOPY W/ RETROGRADES Bilateral 08/02/2022   Procedure: CYSTOSCOPY WITH BILATERAL RETROGRADE PYELOGRAM;  Surgeon: Crista Elliot, MD;  Location: [facility];  Service: Urology;  Laterality: Bilateral;   HERNIA REPAIR N/A    umbilical    POLYPECTOMY  2011   3 HPP   TOTAL KNEE ARTHROPLASTY Right 03/15/2022   Procedure: TOTAL KNEE ARTHROPLASTY;  Surgeon: Frederico Hamman, MD;  Location: [facility];  Service: Orthopedics;  Laterality: Right;   TRANSURETHRAL RESECTION OF BLADDER TUMOR N/A 08/02/2022   Procedure: TRANSURETHRAL RESECTION OF BLADDER TUMOR (TURBT);  Surgeon: Crista Elliot, MD;  Location: [facility];  Service: Urology;  Laterality: N/A;  60 MINS   TRANSURETHRAL RESECTION OF PROSTATE  2020    Social History    Socioeconomic History   Marital status: Married    Spouse name: Not on file   Number of children: 2   Years of education: Not on file   Highest education level: Not on file  Occupational History   Not on file  Tobacco Use   Smoking status: Never   Smokeless tobacco: Never  Vaping Use   Vaping status: Never Used  Substance and Sexual Activity   Alcohol use: No   Drug use: No   Sexual activity: Yes  Other Topics Concern   Not on file  Social History Narrative   Not on file   Social Determinants of Health   Financial Resource Strain: Low Risk  (04/11/2022)   Overall Financial Resource Strain (CARDIA)    Difficulty of Paying Living Expenses: Not hard  at all  Food Insecurity: No Food Insecurity (01/21/2023)   Hunger Vital Sign    Worried About Running Out of Food in the Last Year: Never true    Ran Out of Food in the Last Year: Never true  Transportation Needs: No Transportation Needs (01/21/2023)   PRAPARE - Administrator, Civil Service (Medical): No    Lack of Transportation (Non-Medical): No  Physical Activity: Insufficiently Active (04/11/2022)   Exercise Vital Sign    Days of Exercise per Week: 3 days    Minutes of Exercise per Session: 40 min  Stress: No Stress Concern Present (04/11/2022)   [employer] of Occupational Health - Occupational Stress Questionnaire    Feeling of Stress : Not at all  Social Connections: Socially Integrated (04/11/2022)   Social Connection and Isolation Panel [NHANES]    Frequency of Communication with Friends and Family: More than three times a week    Frequency of Social Gatherings with Friends and Family: More than three times a week    Attends Religious Services: More than 4 times per year    Active Member of [employer] or Organizations: Yes    Attends Engineer, structural: More than 4 times per year    Marital Status: Married  Catering manager Violence: Not At Risk (01/17/2023)   Humiliation, Afraid, Rape, and Kick  questionnaire    Fear of Current or Ex-Partner: No    Emotionally Abused: No    Physically Abused: No    Sexually Abused: No    Family History  Problem Relation Age of Onset   Diabetes Mother    Breast cancer Mother 78   Prostate cancer Father        patient did not have a relationship with his father but understands he had prostate ca   Colon polyps Father 66   Colon cancer Father 57   Diabetes Sister    Pancreatic cancer Neg Hx    Esophageal cancer Neg Hx    Stomach cancer Neg Hx    Rectal cancer Neg Hx      Review of Systems  Constitutional: Negative.  Negative for chills and fever.  HENT: Negative.  Negative for congestion and sore throat.   Respiratory: Negative.  Negative for cough and shortness of breath.   Cardiovascular: Negative.  Negative for chest pain and palpitations.  Gastrointestinal:  Negative for abdominal pain, diarrhea, nausea and vomiting.  Genitourinary:  Positive for hematuria. Negative for dysuria.  Skin: Negative.  Negative for rash.  Neurological: Negative.  Negative for dizziness and headaches.  All other systems reviewed and are negative.   Vitals:   01/28/23 0949  BP: 104/60  Pulse: 69  Temp: 97.8 F (36.6 C)  SpO2: 98%    Physical Exam Vitals reviewed.  Constitutional:      Appearance: Normal appearance. He is obese.  HENT:     Head: Normocephalic.     Mouth/Throat:     Mouth: Mucous membranes are moist.     Pharynx: Oropharynx is clear.  Eyes:     Extraocular Movements: Extraocular movements intact.     Pupils: Pupils are equal, round, and reactive to light.  Cardiovascular:     Rate and Rhythm: Normal rate and regular rhythm.     Pulses: Normal pulses.     Heart sounds: Normal heart sounds.  Pulmonary:     Effort: Pulmonary effort is normal.     Breath sounds: Normal breath sounds.  Abdominal:  Palpations: Abdomen is soft.     Tenderness: There is no abdominal tenderness.  Genitourinary:    Comments: Foley catheter  in place.  Draining bloody urine Musculoskeletal:     Cervical back: No tenderness.     Right lower leg: No edema.     Left lower leg: No edema.  Lymphadenopathy:     Cervical: No cervical adenopathy.  Skin:    General: Skin is warm and dry.  Neurological:     Mental Status: He is alert and oriented to person, place, and time.  Psychiatric:        Mood and Affect: Mood normal.        Behavior: Behavior normal.      ASSESSMENT & PLAN: A total of 47 minutes was spent with the patient and counseling/coordination of care regarding preparing for this visit, review of most recent office visit notes, review of most recent hospital discharge summary, review of multiple chronic medical conditions under management, review of all medications, review of most recent blood work results, need for urology follow-up, education on nutrition, prognosis, documentation and need for follow-up.  Problem List Items Addressed This Visit       Cardiovascular and Mediastinum   Essential hypertension    BP Readings from Last 3 Encounters:  01/28/23 104/60  01/20/23 119/63  01/16/23 135/75  Well-controlled hypertension Continues Zestoretic 20-12.5 mg once a day       Relevant Orders   CBC with Differential/Platelet   Comprehensive metabolic panel     Digestive   Diverticulosis    Stable.  No acute diverticulitis. Accounting for some of the chronic anemia      Relevant Orders   CBC with Differential/Platelet     Genitourinary   Malignant neoplasm of prostate (HCC)    Treated with radiation followed with TURP Told he has radiation cystitis      Relevant Medications   cephALEXin (KEFLEX) 500 MG capsule   Urinary retention    Foley catheter draining well Continues tamsulosin 0.8 mg daily      Gross hematuria    Still ongoing. CBC repeated today Hemodynamically stable Has follow-up with urologist in 2 days      Relevant Orders   CBC with Differential/Platelet   E. coli UTI -  Primary    Clinically stable.  Continues cephalexin 500 mg 4 times a day      Relevant Medications   cephALEXin (KEFLEX) 500 MG capsule     Other   Dyslipidemia    Chronic stable condition.  Continues rosuvastatin 10 mg daily      Other Visit Diagnoses     Hospital discharge follow-up          Patient Instructions  Acute Urinary Retention, Male  Acute urinary retention is when a person cannot pee (urinate) at all, or can only pee a little. This can come on all of a sudden. If it is not treated, it can lead to kidney problems or other serious problems. What are the causes? A problem with the tube that drains the bladder (urethra). Problems with the nerves in the bladder. Tumors. Certain medicines. An infection. Having trouble pooping (constipation). What increases the risk? Older men are more at risk because their prostate gland may become larger as they age. Other conditions also can increase risk. These include: Diseases, such as multiple sclerosis. Injury to the spinal cord. Diabetes. A condition that affects the way the brain works, such as dementia. Holding back urine due to  trauma or because you do not want to use the bathroom. What are the signs or symptoms? Trouble peeing. Pain in the lower belly. How is this treated? Treatment for this condition may include: Medicines. Placing a thin, germ-free tube (catheter) into the bladder to drain pee out of the body. Therapy to treat mental health conditions. Treatment for conditions that may cause this. If needed, you may be treated in the hospital for kidney problems or to manage other problems. Follow these instructions at home: Medicines Take over-the-counter and prescription medicines only as told by your doctor. Ask your doctor what medicines you should stay away from. If you were given an antibiotic medicine, take it as told by your doctor. Do not stop taking it, even if you start to feel better. General  instructions Do not smoke or use any products that contain nicotine or tobacco. If you need help quitting, ask your doctor. Drink enough fluid to keep your pee pale yellow. If you were sent home with a tube that drains the bladder, take care of it as told by your doctor. Watch for changes in your symptoms. Tell your doctor about them. If told, keep track of changes in your blood pressure at home. Tell your doctor about them. Keep all follow-up visits. Contact a doctor if: You have spasms in your bladder that you cannot stop. You leak pee when you have spasms. Get help right away if: You have chills or a fever. You have blood in your pee. You have a tube that drains pee from the bladder and these things happen: The tube stops draining pee. The tube falls out. Summary Acute urinary retention is when you cannot pee at all or you pee too little. If this condition is not treated, it can lead to kidney problems or other serious problems. If you were sent home with a tube (catheter) that drains the bladder, take care of it as told by your doctor. Watch for changes in your symptoms. Tell your doctor about them. This information is not intended to replace advice given to you by your health care provider. Make sure you discuss any questions you have with your health care provider. Document Revised: 02/16/2020 Document Reviewed: 02/16/2020 Elsevier Patient Education  2024 Elsevier Inc.    Edwina Barth, MD [facility]

## 2023-01-28 NOTE — Patient Instructions (Signed)
Acute Urinary Retention, Male  Acute urinary retention is when a person cannot pee (urinate) at all, or can only pee a little. This can come on all of a sudden. If it is not treated, it can lead to kidney problems or other serious problems. What are the causes? A problem with the tube that drains the bladder (urethra). Problems with the nerves in the bladder. Tumors. Certain medicines. An infection. Having trouble pooping (constipation). What increases the risk? Older men are more at risk because their prostate gland may become larger as they age. Other conditions also can increase risk. These include: Diseases, such as multiple sclerosis. Injury to the spinal cord. Diabetes. A condition that affects the way the brain works, such as dementia. Holding back urine due to trauma or because you do not want to use the bathroom. What are the signs or symptoms? Trouble peeing. Pain in the lower belly. How is this treated? Treatment for this condition may include: Medicines. Placing a thin, germ-free tube (catheter) into the bladder to drain pee out of the body. Therapy to treat mental health conditions. Treatment for conditions that may cause this. If needed, you may be treated in the hospital for kidney problems or to manage other problems. Follow these instructions at home: Medicines Take over-the-counter and prescription medicines only as told by your doctor. Ask your doctor what medicines you should stay away from. If you were given an antibiotic medicine, take it as told by your doctor. Do not stop taking it, even if you start to feel better. General instructions Do not smoke or use any products that contain nicotine or tobacco. If you need help quitting, ask your doctor. Drink enough fluid to keep your pee pale yellow. If you were sent home with a tube that drains the bladder, take care of it as told by your doctor. Watch for changes in your symptoms. Tell your doctor about them. If  told, keep track of changes in your blood pressure at home. Tell your doctor about them. Keep all follow-up visits. Contact a doctor if: You have spasms in your bladder that you cannot stop. You leak pee when you have spasms. Get help right away if: You have chills or a fever. You have blood in your pee. You have a tube that drains pee from the bladder and these things happen: The tube stops draining pee. The tube falls out. Summary Acute urinary retention is when you cannot pee at all or you pee too little. If this condition is not treated, it can lead to kidney problems or other serious problems. If you were sent home with a tube (catheter) that drains the bladder, take care of it as told by your doctor. Watch for changes in your symptoms. Tell your doctor about them. This information is not intended to replace advice given to you by your health care provider. Make sure you discuss any questions you have with your health care provider. Document Revised: 02/16/2020 Document Reviewed: 02/16/2020 Elsevier Patient Education  2024 [employer].

## 2023-01-28 NOTE — Assessment & Plan Note (Signed)
BP Readings from Last 3 Encounters:  01/28/23 104/60  01/20/23 119/63  01/16/23 135/75  Well-controlled hypertension Continues Zestoretic 20-12.5 mg once a day

## 2023-01-28 NOTE — Assessment & Plan Note (Signed)
Still ongoing. CBC repeated today Hemodynamically stable Has follow-up with urologist in 2 days

## 2023-01-28 NOTE — Assessment & Plan Note (Signed)
Clinically stable.  Continues cephalexin 500 mg 4 times a day

## 2023-01-28 NOTE — Assessment & Plan Note (Signed)
Chronic stable condition Continues rosuvastatin 10 mg daily

## 2023-01-28 NOTE — Assessment & Plan Note (Signed)
Treated with radiation followed with TURP Told he has radiation cystitis

## 2023-01-30 DIAGNOSIS — N3041 Irradiation cystitis with hematuria: Secondary | ICD-10-CM | POA: Diagnosis not present

## 2023-01-30 DIAGNOSIS — R310 Gross hematuria: Secondary | ICD-10-CM | POA: Diagnosis not present

## 2023-01-31 ENCOUNTER — Other Ambulatory Visit: Payer: Self-pay

## 2023-01-31 ENCOUNTER — Encounter (HOSPITAL_COMMUNITY): Payer: Self-pay

## 2023-01-31 ENCOUNTER — Emergency Department (HOSPITAL_COMMUNITY)
Admission: EM | Admit: 2023-01-31 | Discharge: 2023-01-31 | Disposition: A | Discharge status: Home / Self Care | Payer: Medicare Other | Source: Home / Self Care | Attending: Emergency Medicine | Admitting: Emergency Medicine

## 2023-01-31 DIAGNOSIS — R1030 Lower abdominal pain, unspecified: Secondary | ICD-10-CM | POA: Insufficient documentation

## 2023-01-31 DIAGNOSIS — R339 Retention of urine, unspecified: Secondary | ICD-10-CM | POA: Diagnosis not present

## 2023-01-31 LAB — URINALYSIS, ROUTINE W REFLEX MICROSCOPIC
Bilirubin Urine: NEGATIVE
Glucose, UA: NEGATIVE mg/dL
Ketones, ur: NEGATIVE mg/dL
Nitrite: NEGATIVE
Protein, ur: 30 mg/dL — AB
RBC / HPF: >50 RBC/hpf (ref 0–5)
Specific Gravity, Urine: 1.01 (ref 1.005–1.030)
WBC, UA: >50 WBC/hpf (ref 0–5)
pH: 5 (ref 5.0–8.0)

## 2023-01-31 NOTE — Discharge Instructions (Signed)
Follow-up with the urologist next week.  Continue taking her Keflex

## 2023-01-31 NOTE — ED Triage Notes (Signed)
Pt unable to pee since 0300. Pt endorses the urge to pee, but unable to. Pt had a foley cath removed yesterday.

## 2023-01-31 NOTE — ED Provider Notes (Signed)
[place] EMERGENCY DEPARTMENT AT [facility] Provider Note   CSN: [id number] Arrival date & time: 01/31/23  8295     History {Add pertinent medical, surgical, social history, OB history to HPI:1} Chief Complaint  Patient presents with   Urinary Retention    [name] [name] is a 72 y.o. male.  Patient had a Foley removed yesterday at the urologist office and has not been able to urinate since then.  Bladder scan showed over 400 cc   Abdominal Pain      Home Medications Prior to Admission medications   Medication Sig Start Date End Date Taking? Authorizing Provider  acetaminophen (TYLENOL) 500 MG tablet Take 2 tablets (1,000 mg total) by mouth every 6 (six) hours as needed. 03/15/22 03/15/23  Chadwell, Ivin Booty, PA-C  cephALEXin (KEFLEX) 500 MG capsule Take 500 mg by mouth 4 (four) times daily. 14 days    [provider]  lisinopril-hydrochlorothiazide (ZESTORETIC) 20-12.5 MG tablet TAKE 1 TABLET BY MOUTH EVERY DAY Patient taking differently: Take 1 tablet by mouth daily. 11/16/22   Georgina Quint, MD  oxybutynin (DITROPAN) 5 MG tablet Take 5 mg by mouth daily. 01/16/23   [provider]  oxyCODONE (OXY IR/ROXICODONE) 5 MG immediate release tablet Take one tab po q4-6hrs prn pain Patient not taking: Reported on 11/10/2022 03/15/22   Chadwell, Ivin Booty, PA-C  rosuvastatin (CRESTOR) 10 MG tablet TAKE 1 TABLET BY MOUTH EVERY DAY Patient taking differently: Take 10 mg by mouth daily. 08/18/22   Georgina Quint, MD  tamsulosin (FLOMAX) 0.4 MG CAPS capsule Take 0.8 mg by mouth daily. 01/03/23   [provider]      Allergies    Aspirin, Statins, and Gadolinium derivatives    Review of Systems   Review of Systems  Gastrointestinal:  Positive for abdominal pain.    Physical Exam Updated Vital Signs BP 130/69   Pulse 62   Temp 98.5 F (36.9 C) (Oral)   Resp 15   SpO2 100%  Physical Exam  ED Results / Procedures / Treatments    Labs (all labs ordered are listed, but only abnormal results are displayed) Labs Reviewed  URINALYSIS, ROUTINE W REFLEX MICROSCOPIC - Abnormal; Notable for the following components:      Result Value   APPearance HAZY (*)    Hgb urine dipstick LARGE (*)    Protein, ur 30 (*)    Leukocytes,Ua LARGE (*)    Bacteria, UA RARE (*)    All other components within normal limits  URINE CULTURE    EKG None  Radiology No results found.  Procedures Procedures  {Document cardiac monitor, telemetry assessment procedure when appropriate:1}  Medications Ordered in ED Medications - No data to display  ED Course/ Medical Decision Making/ A&P   {   Click here for ABCD2, HEART and other calculatorsREFRESH Note before signing :1}                              Medical Decision Making Amount and/or Complexity of Data Reviewed Labs: ordered.   Patient with urinary retention..  Foley placed.  Patient will follow-up with urology and continue his Keflex {Document critical care time when appropriate:1} {Document review of labs and clinical decision tools ie heart score, Chads2Vasc2 etc:1}  {Document your independent review of radiology images, and any outside records:1} {Document your discussion with family members, caretakers, and with consultants:1} {Document social determinants of health affecting pt's care:1} {  Document your decision making why or why not admission, treatments were needed:1} Final Clinical Impression(s) / ED Diagnoses Final diagnoses:  Urinary retention    Rx / DC Orders ED Discharge Orders     None

## 2023-02-01 LAB — URINE CULTURE: Culture: <10000 — AB

## 2023-02-12 DIAGNOSIS — R310 Gross hematuria: Secondary | ICD-10-CM | POA: Diagnosis not present

## 2023-02-12 DIAGNOSIS — R338 Other retention of urine: Secondary | ICD-10-CM | POA: Diagnosis not present

## 2023-02-13 ENCOUNTER — Encounter (HOSPITAL_COMMUNITY): Payer: Self-pay | Admitting: Emergency Medicine

## 2023-02-13 ENCOUNTER — Other Ambulatory Visit: Payer: Self-pay

## 2023-02-13 ENCOUNTER — Emergency Department (HOSPITAL_COMMUNITY)
Admission: EM | Admit: 2023-02-13 | Discharge: 2023-02-13 | Disposition: A | Discharge status: Home / Self Care | Payer: Medicare Other | Attending: Emergency Medicine | Admitting: Emergency Medicine

## 2023-02-13 DIAGNOSIS — R339 Retention of urine, unspecified: Secondary | ICD-10-CM

## 2023-02-13 DIAGNOSIS — I10 Essential (primary) hypertension: Secondary | ICD-10-CM | POA: Insufficient documentation

## 2023-02-13 DIAGNOSIS — Z79899 Other long term (current) drug therapy: Secondary | ICD-10-CM | POA: Insufficient documentation

## 2023-02-13 DIAGNOSIS — D72829 Elevated white blood cell count, unspecified: Secondary | ICD-10-CM | POA: Insufficient documentation

## 2023-02-13 DIAGNOSIS — N3001 Acute cystitis with hematuria: Secondary | ICD-10-CM | POA: Diagnosis not present

## 2023-02-13 LAB — URINALYSIS, W/ REFLEX TO CULTURE (INFECTION SUSPECTED)
Bilirubin Urine: NEGATIVE
Glucose, UA: NEGATIVE mg/dL
Ketones, ur: NEGATIVE mg/dL
Nitrite: NEGATIVE
Protein, ur: 30 mg/dL — AB
RBC / HPF: >50 RBC/hpf (ref 0–5)
Specific Gravity, Urine: 1.009 (ref 1.005–1.030)
WBC, UA: >50 WBC/hpf (ref 0–5)
pH: 5 (ref 5.0–8.0)

## 2023-02-13 LAB — I-STAT CHEM 8, ED
BUN: 22 mg/dL (ref 8–23)
Calcium, Ion: 1.24 mmol/L (ref 1.15–1.40)
Chloride: 106 mmol/L (ref 98–111)
Creatinine, Ser: 1.3 mg/dL — ABNORMAL HIGH (ref 0.61–1.24)
Glucose, Bld: 104 mg/dL — ABNORMAL HIGH (ref 70–99)
HCT: 28 % — ABNORMAL LOW (ref 39.0–52.0)
Hemoglobin: 9.5 g/dL — ABNORMAL LOW (ref 13.0–17.0)
Potassium: 4.2 mmol/L (ref 3.5–5.1)
Sodium: 139 mmol/L (ref 135–145)
TCO2: 22 mmol/L (ref 22–32)

## 2023-02-13 MED ORDER — CEPHALEXIN 500 MG PO CAPS
500.0000 mg | ORAL_CAPSULE | Freq: Once | ORAL | Status: AC
  Administered 2023-02-13: 500 mg via ORAL
  Filled 2023-02-13: qty 1

## 2023-02-13 MED ORDER — CEPHALEXIN 500 MG PO CAPS
500.0000 mg | ORAL_CAPSULE | Freq: Three times a day (TID) | ORAL | 0 refills | Status: DC
Start: 2023-02-13 — End: 2023-10-29

## 2023-02-13 NOTE — ED Triage Notes (Signed)
Pt reports urinary retention since 0100 this am. He reports having his foley taken out yesterday morning around 0900. He also reports pain in his groin area he rates a 10/10

## 2023-02-13 NOTE — ED Provider Notes (Signed)
[name] [name]   CSN: [id number] Arrival date & time: 02/13/23  0507    History  Chief Complaint  Patient presents with   Urinary Retention    [name] [name] is a 72 y.o. male history of kidney stones, hypertension, gross hematuria, prostate enlargement here for evaluation of urinary retention.  Had a Foley catheter placed approxi-1 month ago for urinary retention likely due to UTI and enlarged prostate.  Had removed yesterday at urology office.  States initially he was able to void however he got up around 1 AM this morning and only had small amount of urine.  Subsequently developed suprapubic fullness and pain and inability to urinate.  Denies fever, flank pain, nausea, vomiting, diarrhea, hematuria.  No anticoagulation use.  He is no longer on antibiotics for his UTI.  No pain or swelling to scrotum, penis. He is unsure if he is still on the flomax.  HPI     Home Medications Prior to Admission medications   Medication Sig Start Date End Date Taking? Authorizing Provider  cephALEXin (KEFLEX) 500 MG capsule Take 1 capsule (500 mg total) by mouth 3 (three) times daily. 02/13/23  Yes Senai Ramnath A, PA-C  acetaminophen (TYLENOL) 500 MG tablet Take 2 tablets (1,000 mg total) by mouth every 6 (six) hours as needed. 03/15/22 03/15/23  Chadwell, Ivin Booty, PA-C  lisinopril-hydrochlorothiazide (ZESTORETIC) 20-12.5 MG tablet TAKE 1 TABLET BY MOUTH EVERY DAY Patient taking differently: Take 1 tablet by mouth daily. 11/16/22   Georgina Quint, MD  oxybutynin (DITROPAN) 5 MG tablet Take 5 mg by mouth daily. 01/16/23   [provider]  rosuvastatin (CRESTOR) 10 MG tablet TAKE 1 TABLET BY MOUTH EVERY DAY Patient taking differently: Take 10 mg by mouth daily. 08/18/22   Georgina Quint, MD  tamsulosin (FLOMAX) 0.4 MG CAPS capsule Take 0.8 mg by mouth daily. 01/03/23   [provider]      Allergies    Aspirin, Statins,  and Gadolinium derivatives    Review of Systems   Review of Systems  Constitutional: Negative.   HENT: Negative.    Respiratory: Negative.    Cardiovascular: Negative.   Genitourinary:  Positive for decreased urine volume, difficulty urinating and enuresis. Negative for flank pain, frequency, genital sores, hematuria, penile discharge, penile pain, penile swelling, scrotal swelling and testicular pain.  Musculoskeletal: Negative.   Skin: Negative.   Neurological: Negative.   All other systems reviewed and are negative.   Physical Exam Updated Vital Signs BP 113/73 (BP Location: Left Arm)   Pulse 62   Temp 97.8 F (36.6 C) (Oral)   Resp 16   Ht 6\' 2"  (1.88 m)   Wt (!) 138 kg   SpO2 98%   BMI 39.06 kg/m  Physical Exam Vitals and nursing [name] reviewed.  Constitutional:      General: He is not in acute distress.    Appearance: He is well-developed. He is not ill-appearing, toxic-appearing or diaphoretic.  HENT:     Head: Normocephalic and atraumatic.  Eyes:     Pupils: Pupils are equal, round, and reactive to light.  Cardiovascular:     Rate and Rhythm: Normal rate and regular rhythm.     Pulses: Normal pulses.     Heart sounds: Normal heart sounds.  Pulmonary:     Effort: Pulmonary effort is normal. No respiratory distress.     Breath sounds: Normal breath sounds.  Abdominal:     General: Bowel  sounds are normal. There is no distension.     Palpations: Abdomen is soft.     Tenderness: There is no abdominal tenderness. There is no right CVA tenderness, left CVA tenderness, guarding or rebound.     Comments: Soft, nontender, Foley catheter in place draining light yellow urine  Musculoskeletal:        General: Normal range of motion.     Cervical back: Normal range of motion and neck supple.     Right lower leg: No edema.     Left lower leg: No edema.  Skin:    General: Skin is warm and dry.     Capillary Refill: Capillary refill takes less than 2 seconds.   Neurological:     General: No focal deficit present.     Mental Status: He is alert and oriented to person, place, and time.    ED Results / Procedures / Treatments   Labs (all labs ordered are listed, but only abnormal results are displayed) Labs Reviewed  URINALYSIS, W/ REFLEX TO CULTURE (INFECTION SUSPECTED) - Abnormal; Notable for the following components:      Result Value   APPearance HAZY (*)    Hgb urine dipstick LARGE (*)    Protein, ur 30 (*)    Leukocytes,Ua LARGE (*)    Bacteria, UA RARE (*)    All other components within normal limits  I-STAT CHEM 8, ED - Abnormal; Notable for the following components:   Creatinine, Ser 1.30 (*)    Glucose, Bld 104 (*)    Hemoglobin 9.5 (*)    HCT 28.0 (*)    All other components within normal limits  URINE CULTURE    EKG None  Radiology No results found.  Procedures Procedures    Medications Ordered in ED Medications  cephALEXin (KEFLEX) capsule 500 mg (500 mg Oral Given 02/13/23 1610)    ED Course/ Medical Decision Making/ A&P   72 year old male here for evaluation of urinary retention.  History of similar, Foley catheter removed yesterday after having placed for UTI and prostate enlargement.  No longer antibiotics.  No systemic symptoms.  Noted yesterday after around 1 AM having difficulty voiding, and able to void at all this morning.  Had some suprapubic fullness.  No flank pain.  No nausea or vomiting.  No hematuria.  Was able to void after having his Foley catheter removed at urology office.  By the time I assessed patient nursing had already placed a Foley catheter at bedside.  It is draining clear yellow urine with >600cc in bag  Patient now asymptomatic, no pain or fullness.  His heart and lungs are clear.  Abdomen soft, nontender.  Will check urinalysis, renal function.   Labs personally viewed and interpreted:  UA large leuks, greater than 50 WBC, rare bacteria, sent for culture.  Will start antibiotics,  reviewed prior culture sensitive to cephalosporins.  Will start on Keflex, first dose here Chem 8 creatinine 1.3, hemoglobin 9.5, similar to prior  Patient reassessed.  Still asymptomatic, feels significantly improved after Foley catheter placed.  Tolerating p.o. intake.  No obvious blood in bag.  We discussed close follow-up outpatient, starting on antibiotics, seeing urology.  He is agreeable.  He does have Flomax at home.   The patient has been appropriately medically screened and/or stabilized in the ED. I have low suspicion for any other emergent medical condition which would require further screening, evaluation or treatment in the ED or require inpatient management.  Patient is hemodynamically  stable and in no acute distress.  Patient able to ambulate in department prior to ED.  Evaluation does not show acute pathology that would require ongoing or additional emergent interventions while in the emergency department or further inpatient treatment.  I have discussed the diagnosis with the patient and answered all questions.  Pain is been managed while in the emergency department and patient has no further complaints prior to discharge.  Patient is comfortable with plan discussed in room and is stable for discharge at this time.  I have discussed strict return precautions for returning to the emergency department.  Patient was encouraged to follow-up with PCP/specialist refer to at discharge.                                 Medical Decision Making Amount and/or Complexity of Data Reviewed External Data Reviewed: labs, radiology and notes. Labs: ordered. Decision-making details documented in ED Course.  Risk OTC drugs. Prescription drug management. Diagnosis or treatment significantly limited by social determinants of health.          Final Clinical Impression(s) / ED Diagnoses Final diagnoses:  Urinary retention  Acute cystitis with hematuria    Rx / DC Orders ED Discharge  Orders          Ordered    cephALEXin (KEFLEX) 500 MG capsule  3 times daily        02/13/23 0824              Stokes Rattigan A, PA-C 02/13/23 0843    Loetta Rough, MD 02/13/23 [phone]

## 2023-02-13 NOTE — Discharge Instructions (Signed)
Your foley catheter was replaced today  Urine does show possible infection we have started her on antibiotic  Make sure to follow-up with urology  Return for new or worsening symptoms, such as fever, persistent vomiting, persistent blood in your Foley catheter bag, abdominal pain

## 2023-02-14 ENCOUNTER — Other Ambulatory Visit: Payer: Self-pay | Admitting: Emergency Medicine

## 2023-02-14 DIAGNOSIS — E785 Hyperlipidemia, unspecified: Secondary | ICD-10-CM

## 2023-02-15 LAB — URINE CULTURE: Culture: 20000 — AB

## 2023-02-17 ENCOUNTER — Telehealth (HOSPITAL_BASED_OUTPATIENT_CLINIC_OR_DEPARTMENT_OTHER): Payer: Self-pay | Admitting: *Deleted

## 2023-02-17 NOTE — Telephone Encounter (Signed)
Post ED Visit - Positive Culture Follow-up: Unsuccessful Patient Follow-up  Culture assessed and recommendations reviewed by:  []  Enzo Bi, Pharm.D. []  Celedonio Miyamoto, Pharm.D., BCPS AQ-ID []  Garvin Fila, Pharm.D., BCPS []  Georgina Pillion, Pharm.D., BCPS []  [place], [address].D., BCPS, AAHIVP []  Estella Husk, Pharm.D., BCPS, AAHIVP []  Sherlynn Carbon, PharmD []  Pollyann Samples, PharmD, BCPS  Positive Urine culture  []  Patient discharged without antimicrobial prescription and treatment is now indicated []  Organism is resistant to prescribed ED discharge antimicrobial []  Patient with positive blood cultures   Unable to contact patient after 3 attempts, letter will be sent to address on file Plan is to stop taking Keflex and not additional  antibiotics are needed. Dr. Gwyneth Sprout would favor not treating due low colony count.  [name] [name] 02/17/2023, 1:26 PM

## 2023-02-17 NOTE — Progress Notes (Signed)
ED Antimicrobial Stewardship Positive Culture Follow Up   [name] [name] is an 72 y.o. male who presented to [facility] on 02/13/2023 with a chief complaint of  Chief Complaint  Patient presents with   Urinary Retention    Recent Results (from the past 720 hour(s))  Urine Culture     Status: Abnormal   Collection Time: 01/31/23 10:39 AM   Specimen: Urine, Clean Catch  Result Value Ref Range Status   Specimen Description   Final    URINE, CLEAN CATCH Performed at [facility], [address] [place], [place] [address]    Special Requests   Final    NONE Performed at [facility], [address], [place], [place] [address]    Culture (A)  Final    <10,000 COLONIES/mL INSIGNIFICANT GROWTH Performed at [facility], [address], [place], [place] [address]    Report Status 02/01/2023 FINAL  Final  Urine Culture     Status: Abnormal   Collection Time: 02/13/23  7:34 AM   Specimen: Urine, Random  Result Value Ref Range Status   Specimen Description   Final    URINE, RANDOM Performed at [facility], [address] [place], [place] [address]    Special Requests   Final    NONE Reflexed from [id number] Performed at [facility], [address], [place], [place] [address]    Culture 20,000 COLONIES/mL PSEUDOMONAS AERUGINOSA (A)  Final   Report Status 02/15/2023 FINAL  Final   Organism ID, Bacteria PSEUDOMONAS AERUGINOSA (A)  Final      Susceptibility   Pseudomonas aeruginosa - MIC*    CEFTAZIDIME <=1 SENSITIVE Sensitive     CIPROFLOXACIN <=0.25 SENSITIVE Sensitive     GENTAMICIN 4 SENSITIVE Sensitive     IMIPENEM <=0.25 SENSITIVE Sensitive     PIP/TAZO 8 SENSITIVE Sensitive     * 20,000 COLONIES/mL PSEUDOMONAS AERUGINOSA    [x]  Treated with Keflex, organism resistant to prescribed antimicrobial  72 YOM with urinary retention related to recent foley removal, no systemic signs of infection. Low  colony count likely colonization - no need to treat at this time.   Call patient to d/c Keflex - no additional antibiotics needed at this time. Follow-up with urology outpatient.  ED Provider: Gwyneth Sprout, MD  Thank you for allowing pharmacy to be a part of this patient's care.  Georgina Pillion, PharmD, BCPS, BCIDP Infectious Diseases Clinical Pharmacist 02/17/2023 9:21 AM   **Pharmacist phone directory can now be found on [email or web address] ([email or web address]).  Listed under [facility].

## 2023-03-05 ENCOUNTER — Other Ambulatory Visit: Payer: Self-pay | Admitting: Urology

## 2023-03-05 DIAGNOSIS — R310 Gross hematuria: Secondary | ICD-10-CM

## 2023-03-05 DIAGNOSIS — R338 Other retention of urine: Secondary | ICD-10-CM | POA: Diagnosis not present

## 2023-03-05 DIAGNOSIS — C61 Malignant neoplasm of prostate: Secondary | ICD-10-CM

## 2023-03-05 DIAGNOSIS — N3041 Irradiation cystitis with hematuria: Secondary | ICD-10-CM | POA: Diagnosis not present

## 2023-03-06 ENCOUNTER — Ambulatory Visit: Payer: Medicare Other

## 2023-03-06 VITALS — Ht 74.0 in | Wt 299.0 lb

## 2023-03-06 DIAGNOSIS — Z0000 Encounter for general adult medical examination without abnormal findings: Secondary | ICD-10-CM

## 2023-03-06 NOTE — Patient Instructions (Signed)
Mr. [name] [name] , Thank you for taking time to come for your Medicare Wellness Visit. I appreciate your ongoing commitment to your health goals. Please review the following plan we discussed and let me know if I can assist you in the future.   Referrals/Orders/Follow-Ups/Clinician Recommendations: You are due for your Flu vaccine.  Remember to discuss the Covid vaccine with Dr. Alvy Bimler during your next visit.  It was nice to talk with you today.  Aim for 30 minutes of exercise or brisk walking, 6-8 glasses of water, and 5 servings of fruits and vegetables each day.   This is a list of the screening recommended for you and due dates:  Health Maintenance  Topic Date Due   Flu Shot  01/09/2023   COVID-19 Vaccine (4 - 2023-24 season) 02/09/2023   Medicare Annual Wellness Visit  03/05/2024   DTaP/Tdap/Td vaccine (3 - Td or Tdap) 04/14/2030   Colon Cancer Screening  04/24/2030   Pneumonia Vaccine  Completed   Hepatitis C Screening  Completed   Zoster (Shingles) Vaccine  Completed   HPV Vaccine  Aged Out    Advanced directives: (Copy Requested) Please bring a copy of your health care power of attorney and living will to the office to be added to your chart at your convenience.  Next Medicare Annual Wellness Visit scheduled for next year: Yes

## 2023-03-06 NOTE — Progress Notes (Signed)
Subjective:   [name] [name] is a 72 y.o. male who presents for Medicare Annual/Subsequent preventive examination.  Visit Complete: Virtual  I connected with  [name] [name] on 03/06/23 by a audio enabled telemedicine application and verified that I am speaking with the correct person using two identifiers.  Patient Location: Home  Provider Location: Office/Clinic  I discussed the limitations of evaluation and management by telemedicine. The patient expressed understanding and agreed to proceed.  Because this visit was a virtual/telehealth visit, some criteria may be missing or patient reported. Any vitals not documented were not able to be obtained and vitals that have been documented are patient reported.    Cardiac Risk Factors include: advanced age (>1men, >1 women);hypertension;Other (see comment);dyslipidemia;obesity (BMI >30kg/m2), Risk factor comments: Prostate enlargement     Objective:    Today's Vitals   03/06/23 1348  Weight: 299 lb (135.6 kg)  Height: 6\' 2"  (1.88 m)   Body mass index is 38.39 kg/m.     03/06/2023    1:55 PM 02/13/2023    5:23 AM 01/31/2023    9:59 AM 01/17/2023    3:07 PM 01/16/2023   10:05 PM 01/16/2023    1:19 AM 08/02/2022   12:05 PM  Advanced Directives  Does Patient Have a Medical Advance Directive? No No No No No No No  Would patient like information on creating a medical advance directive?  No - Patient declined No - Guardian declined Yes (Inpatient - patient requests chaplain consult to create a medical advance directive) No - Patient declined No - Patient declined No - Patient declined    Current Medications (verified) Outpatient Encounter Medications as of 03/06/2023  Medication Sig   acetaminophen (TYLENOL) 500 MG tablet Take 2 tablets (1,000 mg total) by mouth every 6 (six) hours as needed.   lisinopril-hydrochlorothiazide (ZESTORETIC) 20-12.5 MG tablet TAKE 1 TABLET BY MOUTH EVERY DAY (Patient taking differently: Take 1 tablet by  mouth daily.)   rosuvastatin (CRESTOR) 10 MG tablet TAKE 1 TABLET BY MOUTH EVERY DAY   tamsulosin (FLOMAX) 0.4 MG CAPS capsule Take 0.8 mg by mouth daily.   cephALEXin (KEFLEX) 500 MG capsule Take 1 capsule (500 mg total) by mouth 3 (three) times daily. (Patient not taking: Reported on 03/06/2023)   oxybutynin (DITROPAN) 5 MG tablet Take 5 mg by mouth daily. (Patient not taking: Reported on 03/06/2023)   No facility-administered encounter medications on file as of 03/06/2023.    Allergies (verified) Aspirin, Statins, and Gadolinium derivatives   History: Past Medical History:  Diagnosis Date   Allergy    seasonal allergies   Arthritis    generalized   Blood transfusion without reported diagnosis 2013   when had diverticulitis flare   BPH (benign prostatic hypertrophy)    Colon polyp    diverticular bleed with transfusion   Diverticulosis    History of kidney stones    Hyperlipidemia    on meds   Hypertension    on meds   Kidney stones    hx of 15-20 kidney stones    Obesity, unspecified    Pre-diabetes    Prostate cancer (HCC) 2020   dx 05/2019   Prostate nodule 07/27/2008   Past Surgical History:  Procedure Laterality Date   ANKLE FUSION Left 1991   COLONOSCOPY  2011   DB-F/V-miralax(good)-TICS/3 HPP   CYSTOSCOPY W/ RETROGRADES Bilateral 08/02/2022   Procedure: CYSTOSCOPY WITH BILATERAL RETROGRADE PYELOGRAM;  Surgeon: Crista Elliot, MD;  Location: [facility];  Service: Urology;  Laterality: Bilateral;   HERNIA REPAIR N/A    umbilical    POLYPECTOMY  2011   3 HPP   TOTAL KNEE ARTHROPLASTY Right 03/15/2022   Procedure: TOTAL KNEE ARTHROPLASTY;  Surgeon: Frederico Hamman, MD;  Location: [facility];  Service: Orthopedics;  Laterality: Right;   TRANSURETHRAL RESECTION OF BLADDER TUMOR N/A 08/02/2022   Procedure: TRANSURETHRAL RESECTION OF BLADDER TUMOR (TURBT);  Surgeon: Crista Elliot, MD;  Location: [facility];  Service: Urology;  Laterality: N/A;  60 MINS   TRANSURETHRAL  RESECTION OF PROSTATE  2020   Family History  Problem Relation Age of Onset   Diabetes Mother    Breast cancer Mother 95   Prostate cancer Father        patient did not have a relationship with his father but understands he had prostate ca   Colon polyps Father 31   Colon cancer Father 78   Diabetes Sister    Pancreatic cancer Neg Hx    Esophageal cancer Neg Hx    Stomach cancer Neg Hx    Rectal cancer Neg Hx    Social History   Socioeconomic History   Marital status: Married    Spouse name: [name]   Number of children: 2   Years of education: Not on file   Highest education level: Not on file  Occupational History   Occupation: Retired  Tobacco Use   Smoking status: Never   Smokeless tobacco: Never  Vaping Use   Vaping status: Never Used  Substance and Sexual Activity   Alcohol use: No   Drug use: No   Sexual activity: Yes  Other Topics Concern   Not on file  Social History Narrative   Lives with wife.   Social Determinants of Health   Financial Resource Strain: Medium Risk (03/06/2023)   Overall Financial Resource Strain (CARDIA)    Difficulty of Paying Living Expenses: Somewhat hard  Food Insecurity: No Food Insecurity (03/06/2023)   Hunger Vital Sign    Worried About Running Out of Food in the Last Year: Never true    Ran Out of Food in the Last Year: Never true  Transportation Needs: No Transportation Needs (03/06/2023)   PRAPARE - Administrator, Civil Service (Medical): No    Lack of Transportation (Non-Medical): No  Physical Activity: Inactive (03/06/2023)   Exercise Vital Sign    Days of Exercise per Week: 0 days    Minutes of Exercise per Session: 0 min  Stress: No Stress Concern Present (03/06/2023)   [employer] of Occupational Health - Occupational Stress Questionnaire    Feeling of Stress : Not at all  Social Connections: Moderately Integrated (03/06/2023)   Social Connection and Isolation Panel [NHANES]    Frequency of  Communication with Friends and Family: More than three times a week    Frequency of Social Gatherings with Friends and Family: Once a week    Attends Religious Services: More than 4 times per year    Active Member of [employer] or Organizations: No    Attends Engineer, structural: Never    Marital Status: Married    Tobacco Counseling Counseling given: Not Answered   Clinical Intake:  Pre-visit preparation completed: Yes  Pain : No/denies pain     BMI - recorded: 38.39 Nutritional Status: BMI > 30  Obese Nutritional Risks: None Diabetes: No  How often do you need to have someone help you when you read instructions, pamphlets, or other written materials from  your doctor or pharmacy?: 1 - Never  Interpreter Needed?: No  Information entered by :: Kees Idrovo, RMA   Activities of Daily Living    03/06/2023    1:52 PM 01/17/2023    3:10 PM  In your present state of health, do you have any difficulty performing the following activities:  Hearing? 0   Vision? 0   Difficulty concentrating or making decisions? 0   Walking or climbing stairs? 0   Dressing or bathing? 0   Doing errands, shopping? 0 0  Preparing Food and eating ? N   Using the Toilet? N   In the past six months, have you accidently leaked urine? Y   Do you have problems with loss of bowel control? N   Managing your Medications? N   Managing your Finances? N   Housekeeping or managing your Housekeeping? N     Patient Care Team: Georgina Quint, MD as PCP - General (Internal Medicine) Felicita Gage, RN Nurse Navigator as Registered Nurse (Medical Oncology)  Indicate any recent Medical Services you may have received from other than [facility] providers in the past year (date may be approximate).     Assessment:   This is a routine wellness examination for Jaymon.  Hearing/Vision screen Hearing Screening - Comments:: Denies hearing difficulties     Goals Addressed   None   Depression Screen     03/06/2023    2:00 PM 10/30/2022    8:36 AM 04/11/2022    2:37 PM 02/06/2022   10:36 AM 08/20/2021    9:00 AM 07/30/2021    1:48 PM 12/07/2020    9:00 AM  PHQ 2/9 Scores  PHQ - 2 Score 1 0 0 0 0 0 0  PHQ- 9 Score 6 0  0       Fall Risk    03/06/2023    1:56 PM 10/30/2022    8:35 AM 04/11/2022    2:35 PM 02/06/2022   10:36 AM 01/16/2022    2:10 PM  Fall Risk   Falls in the past year? 0 0 0 0 0  Comment     continues to deny new/ recent falls x last 12 months; occasionally uses cane  Number falls in past yr: 0 0 0 0 0  Injury with Fall? 0 0 0 0 0  Risk for fall due to : No Fall Risks No Fall Risks No Fall Risks No Fall Risks Orthopedic patient  Follow up Falls evaluation completed;Falls prevention discussed Falls evaluation completed Falls prevention discussed Falls evaluation completed Falls prevention discussed    MEDICARE RISK AT HOME: Medicare Risk at Home Any stairs in or around the home?: Yes If so, are there any without handrails?: Yes Home free of loose throw rugs in walkways, pet beds, electrical cords, etc?: Yes Adequate lighting in your home to reduce risk of falls?: Yes Life alert?: No Use of a cane, walker or w/c?: Yes Grab bars in the bathroom?: No Shower chair or bench in shower?: Yes Elevated toilet seat or a handicapped toilet?: No  TIMED UP AND GO:  Was the test performed?  No    Cognitive Function:        03/06/2023    1:56 PM 04/11/2022    2:43 PM  6CIT Screen  What Year? 0 points 0 points  What month? 0 points 0 points  What time? 0 points 0 points  Count back from 20 0 points 0 points  Months in reverse 0 points  0 points  Repeat phrase 2 points 0 points  Total Score 2 points 0 points    Immunizations Immunization History  Administered Date(s) Administered   Fluad Quad(high Dose 65+) 04/14/2020   Influenza Split 08/01/2010   Influenza, High Dose Seasonal PF 03/03/2018, 04/05/2019   Influenza,inj,Quad PF,6+ Mos 02/25/2015, 04/05/2016    Influenza-Unspecified 03/20/2019   PFIZER(Purple Top)SARS-COV-2 Vaccination 07/06/2019, 07/27/2019, 05/11/2020   Pneumococcal Conjugate-13 03/03/2018   Pneumococcal Polysaccharide-23 02/25/2007, 06/07/2019   Td 04/14/2020   Tdap 07/31/2009   Zoster Recombinant(Shingrix) 11/16/2020, 01/23/2021    TDAP status: Up to date  Flu Vaccine status: Due, Education has been provided regarding the importance of this vaccine. Advised may receive this vaccine at local pharmacy or Health Dept. Aware to provide a copy of the vaccination record if obtained from local pharmacy or Health Dept. Verbalized acceptance and understanding.  Pneumococcal vaccine status: Up to date  Covid-19 vaccine status: Information provided on how to obtain vaccines.   Qualifies for Shingles Vaccine? Yes   Zostavax completed Yes   Shingrix Completed?: Yes  Screening Tests Health Maintenance  Topic Date Due   INFLUENZA VACCINE  01/09/2023   COVID-19 Vaccine (4 - 2023-24 season) 02/09/2023   Medicare Annual Wellness (AWV)  03/05/2024   DTaP/Tdap/Td (3 - Td or Tdap) 04/14/2030   Colonoscopy  04/24/2030   Pneumonia Vaccine 49+ Years old  Completed   Hepatitis C Screening  Completed   Zoster Vaccines- Shingrix  Completed   HPV VACCINES  Aged Out    Health Maintenance  Health Maintenance Due  Topic Date Due   INFLUENZA VACCINE  01/09/2023   COVID-19 Vaccine (4 - 2023-24 season) 02/09/2023    Colorectal cancer screening: Type of screening: Colonoscopy. Completed 03/19/2023. Repeat every 10 years  Lung Cancer Screening: (Low Dose CT Chest recommended if Age 30-80 years, 20 pack-year currently smoking OR have quit w/in 15years.) does not qualify.   Lung Cancer Screening Referral: N/A  Additional Screening:  Hepatitis C Screening: does qualify; Completed 01/09/2017  Vision Screening: Recommended annual ophthalmology exams for early detection of glaucoma and other disorders of the eye. Is the patient up to date  with their annual eye exam?  Yes  Who is the provider or what is the name of the office in which the patient attends annual eye exams? [facility] If pt is not established with a provider, would they like to be referred to a provider to establish care? No .   Dental Screening: Recommended annual dental exams for proper oral hygiene   Community Resource Referral / Chronic Care Management: CRR required this visit?  No   CCM required this visit?  No     Plan:     I have personally reviewed and noted the following in the patient's chart:   Medical and social history Use of alcohol, tobacco or illicit drugs  Current medications and supplements including opioid prescriptions. Patient is not currently taking opioid prescriptions. Functional ability and status Nutritional status Physical activity Advanced directives List of other physicians Hospitalizations, surgeries, and ER visits in previous 12 months Vitals Screenings to include cognitive, depression, and falls Referrals and appointments  In addition, I have reviewed and discussed with patient certain preventive protocols, quality metrics, and best practice recommendations. A written personalized care plan for preventive services as well as general preventive health recommendations were provided to patient.     Jamarcus Laduke L Samyrah Bruster, CMA   03/06/2023   After Visit Summary: (MyChart) Due to this being a  telephonic visit, the after visit summary with patients personalized plan was offered to patient via MyChart   Nurse Notes: Patient is due for the Flu vaccine.  He would like to discuss the Covid vaccine with PCP during his next visit.  Patient is up to date on his health maintenance.  He had no other concerns to address today.

## 2023-03-09 ENCOUNTER — Other Ambulatory Visit: Payer: Self-pay

## 2023-03-09 ENCOUNTER — Encounter (HOSPITAL_COMMUNITY): Payer: Self-pay

## 2023-03-09 ENCOUNTER — Emergency Department (HOSPITAL_COMMUNITY)
Admission: EM | Admit: 2023-03-09 | Discharge: 2023-03-09 | Disposition: A | Discharge status: Home / Self Care | Payer: Medicare Other | Attending: Emergency Medicine | Admitting: Emergency Medicine

## 2023-03-09 DIAGNOSIS — I10 Essential (primary) hypertension: Secondary | ICD-10-CM | POA: Diagnosis not present

## 2023-03-09 DIAGNOSIS — Z8546 Personal history of malignant neoplasm of prostate: Secondary | ICD-10-CM | POA: Diagnosis not present

## 2023-03-09 DIAGNOSIS — R339 Retention of urine, unspecified: Secondary | ICD-10-CM | POA: Insufficient documentation

## 2023-03-09 LAB — URINALYSIS, ROUTINE W REFLEX MICROSCOPIC
Bilirubin Urine: NEGATIVE
Glucose, UA: NEGATIVE mg/dL
Ketones, ur: NEGATIVE mg/dL
Nitrite: NEGATIVE
Protein, ur: 30 mg/dL — AB
RBC / HPF: >50 RBC/hpf (ref 0–5)
Specific Gravity, Urine: 1.014 (ref 1.005–1.030)
WBC, UA: >50 WBC/hpf (ref 0–5)
pH: 5 (ref 5.0–8.0)

## 2023-03-09 MED ORDER — MORPHINE SULFATE (PF) 4 MG/ML IV SOLN
4.0000 mg | Freq: Once | INTRAVENOUS | Status: AC
  Administered 2023-03-09: 4 mg via INTRAVENOUS
  Filled 2023-03-09: qty 1

## 2023-03-09 MED ORDER — OXYCODONE-ACETAMINOPHEN 5-325 MG PO TABS
1.0000 | ORAL_TABLET | Freq: Once | ORAL | Status: AC
  Administered 2023-03-09: 1 via ORAL
  Filled 2023-03-09: qty 1

## 2023-03-09 NOTE — ED Triage Notes (Signed)
Patient here for evaluation of urinary retention. Recently had a foley placed and it was removed on Wednesday. States everything was going well, but has been unable to urinate since 0500 this morning. Pt currently pacing the triage room due to pain.

## 2023-03-09 NOTE — Discharge Instructions (Addendum)
Please follow with Urology moving forward. Return to the ED with any new or worsening symptoms.

## 2023-03-09 NOTE — Consult Note (Signed)
Urology Consult   Reason for consult: uinary retention, difficult foley  History of Present Illness: [name] [name] is a 72 y.o. M who presented to the ED in retention. Multiple foley attempts were not successful in the ED, including a coude.   [name] [name] is well known to our service. He has a history of radiation cystitis and more recently has been having some issues with retention. He passed a voiding trial in our clinic Wednesday. He was doing fine but unfortunately could not void beginning this AM  Past Medical History:  Diagnosis Date   Allergy    seasonal allergies   Arthritis    generalized   Blood transfusion without reported diagnosis 2013   when had diverticulitis flare   BPH (benign prostatic hypertrophy)    Colon polyp    diverticular bleed with transfusion   Diverticulosis    History of kidney stones    Hyperlipidemia    on meds   Hypertension    on meds   Kidney stones    hx of 15-20 kidney stones    Obesity, unspecified    Pre-diabetes    Prostate cancer (HCC) 2020   dx 05/2019   Prostate nodule 07/27/2008    Past Surgical History:  Procedure Laterality Date   ANKLE FUSION Left 1991   COLONOSCOPY  2011   DB-F/V-miralax(good)-TICS/3 HPP   CYSTOSCOPY W/ RETROGRADES Bilateral 08/02/2022   Procedure: CYSTOSCOPY WITH BILATERAL RETROGRADE PYELOGRAM;  Surgeon: Crista Elliot, MD;  Location: [facility];  Service: Urology;  Laterality: Bilateral;   HERNIA REPAIR N/A    umbilical    POLYPECTOMY  2011   3 HPP   TOTAL KNEE ARTHROPLASTY Right 03/15/2022   Procedure: TOTAL KNEE ARTHROPLASTY;  Surgeon: Frederico Hamman, MD;  Location: [facility];  Service: Orthopedics;  Laterality: Right;   TRANSURETHRAL RESECTION OF BLADDER TUMOR N/A 08/02/2022   Procedure: TRANSURETHRAL RESECTION OF BLADDER TUMOR (TURBT);  Surgeon: Crista Elliot, MD;  Location: [facility];  Service: Urology;  Laterality: N/A;  60 MINS   TRANSURETHRAL RESECTION OF PROSTATE  2020    Current Hospital  Medications:  Home Meds:  No current facility-administered medications on file prior to encounter.   Current Outpatient Medications on File Prior to Encounter  Medication Sig Dispense Refill   acetaminophen (TYLENOL) 500 MG tablet Take 2 tablets (1,000 mg total) by mouth every 6 (six) hours as needed. 100 tablet 2   cephALEXin (KEFLEX) 500 MG capsule Take 1 capsule (500 mg total) by mouth 3 (three) times daily. (Patient not taking: Reported on 03/06/2023) 20 capsule 0   lisinopril-hydrochlorothiazide (ZESTORETIC) 20-12.5 MG tablet TAKE 1 TABLET BY MOUTH EVERY DAY (Patient taking differently: Take 1 tablet by mouth daily.) 90 tablet 3   oxybutynin (DITROPAN) 5 MG tablet Take 5 mg by mouth daily. (Patient not taking: Reported on 03/06/2023)     rosuvastatin (CRESTOR) 10 MG tablet TAKE 1 TABLET BY MOUTH EVERY DAY 90 tablet 1   tamsulosin (FLOMAX) 0.4 MG CAPS capsule Take 0.8 mg by mouth daily.       Scheduled Meds:   morphine injection  4 mg Intravenous Once   Continuous Infusions: PRN Meds:.  Allergies:  Allergies  Allergen Reactions   Aspirin     REACTION: upsets stomach   Statins     REACTION: UPSETS STOMACH   Gadolinium Derivatives Nausea And Vomiting    Pt was given 20 ml multihance and became nauseated and vomited several times.  Family History  Problem Relation Age of Onset   Diabetes Mother    Breast cancer Mother 24   Prostate cancer Father        patient did not have a relationship with his father but understands he had prostate ca   Colon polyps Father 54   Colon cancer Father 34   Diabetes Sister    Pancreatic cancer Neg Hx    Esophageal cancer Neg Hx    Stomach cancer Neg Hx    Rectal cancer Neg Hx     Social History:  reports that he has never smoked. He has never used smokeless tobacco. He reports that he does not drink alcohol and does not use drugs.  ROS: A complete review of systems was performed.  All systems are negative except for pertinent  findings as noted.  Physical Exam:  Vital signs in last 24 hours: Temp:  [98 F (36.7 C)] 98 F (36.7 C) (09/29 1121) Pulse Rate:  [110] 110 (09/29 1121) Resp:  [20] 20 (09/29 1121) BP: (146)/(80) 146/80 (09/29 1121) SpO2:  [99 %] 99 % (09/29 1121) Weight:  [135.6 kg] 135.6 kg (09/29 1126) Constitutional:  Alert and oriented, in distress Cardiovascular: Regular rate and rhythm Respiratory: Normal respiratory effort, Lungs clear bilaterally GI: Abdomen is soft, nontender, nondistended, no abdominal masses Neurologic: Grossly intact, no focal deficits Psychiatric: Normal mood and affect  Laboratory Data:  No results for input(s): "WBC", "HGB", "HCT", "PLT" in the last 72 hours.  No results for input(s): "NA", "K", "CL", "GLUCOSE", "BUN", "CALCIUM", "CREATININE" in the last 72 hours.  Invalid input(s): "CO3"   No results found for this or any previous visit (from the past 24 hour(s)). No results found for this or any previous visit (from the past 240 hour(s)).  Renal Function: No results for input(s): "CREATININE" in the last 168 hours. CrCl cannot be calculated (Patient's most recent lab result is older than the maximum 21 days allowed.).  Radiologic Imaging: No results found.  I independently reviewed the above imaging studies.  Procedure The patient was prepped in the usual sterile fashion.  I began by trying to place a wire through the urethra into the bladder but kept curling and coming back out the meatus.  I then carefully inserted the flexible cystoscope.  Around the level of the prostate there appeared to be a false passage.  I deflected the scope anteriorly and was able to make into the bladder.  I then passed a wire into the bladder.  The cystoscope was removed and a 16 [place] council tip catheter was easily placed.  The balloon was inflated with 10 cc and was connected to the appropriate collection bag.  Impression/Recommendation 72 yo M with urinary retention.  Foley placed - see procedure note above --patient should call our clinic tomorrow to arrange follow up and possible voiding trial  Irine Seal MD 03/09/2023, 1:51 PM  [facility]  Pager: [phone]

## 2023-03-09 NOTE — ED Provider Notes (Signed)
Emergency Department Provider Note   I have reviewed the triage vital signs and the nursing notes.   HISTORY  Chief Complaint Urinary Retention   HPI [name] [name] is a 72 y.o. male with past history of BPH presents emergency department with urinary retention.  He has had a recent evaluation for similar and went home from the ED with a Foley catheter in place during a prior visit.  [facility] and passed a void trial in office but unfortunately, this morning, has not been able to urinate.  Is having some suprapubic fullness and discomfort.  No fevers.  Past Medical History:  Diagnosis Date   Allergy    seasonal allergies   Arthritis    generalized   Blood transfusion without reported diagnosis 2013   when had diverticulitis flare   BPH (benign prostatic hypertrophy)    Colon polyp    diverticular bleed with transfusion   Diverticulosis    History of kidney stones    Hyperlipidemia    on meds   Hypertension    on meds   Kidney stones    hx of 15-20 kidney stones    Obesity, unspecified    Pre-diabetes    Prostate cancer (HCC) 2020   dx 05/2019   Prostate nodule 07/27/2008    Review of Systems  Constitutional: No fever/chills Cardiovascular: Denies chest pain. Respiratory: Denies shortness of breath. Gastrointestinal: No abdominal pain.  Genitourinary: Positive urinary retention.  Musculoskeletal: Negative for back pain. Skin: Negative for rash. Neurological: Negative for headaches  ____________________________________________   PHYSICAL EXAM:  VITAL SIGNS: ED Triage Vitals  Encounter Vitals Group     BP 03/09/23 1121 (!) 146/80     Pulse Rate 03/09/23 1121 (!) 110     Resp 03/09/23 1121 20     Temp 03/09/23 1121 98 F (36.7 C)     Temp Source 03/09/23 1121 Oral     SpO2 03/09/23 1121 99 %     Weight 03/09/23 1126 298 lb 15.1 oz (135.6 kg)     Height 03/09/23 1126 6\' 2"  (1.88 m)   Constitutional: Alert and oriented.  Appears uncomfortable.  Eyes: Conjunctivae are normal.  Head: Atraumatic. Nose: No congestion/rhinnorhea. Mouth/Throat: Mucous membranes are moist.   Neck: No stridor. Cardiovascular: Normal rate, regular rhythm. Good peripheral circulation. Grossly normal heart sounds.   Respiratory: Normal respiratory effort.  No retractions. Lungs CTAB. Gastrointestinal: Soft and nontender. Positive suprapubic fullness. No distention.  Musculoskeletal: No gross deformities of extremities. Neurologic:  Normal speech and language. Skin:  Skin is warm, dry and intact. No rash noted. ____________________________________________   LABS (all labs ordered are listed, but only abnormal results are displayed)  Labs Reviewed  URINALYSIS, ROUTINE W REFLEX MICROSCOPIC - Abnormal; Notable for the following components:      Result Value   APPearance HAZY (*)    Hgb urine dipstick LARGE (*)    Protein, ur 30 (*)    Leukocytes,Ua LARGE (*)    Bacteria, UA RARE (*)    All other components within normal limits  ____________________   PROCEDURES  Procedure(s) performed:   Procedures  None ____________________________________________   INITIAL IMPRESSION / ASSESSMENT AND PLAN / ED COURSE  Pertinent labs & imaging results that were available during my care of the patient were reviewed by me and considered in my medical decision making (see chart for details).   This patient is Presenting for Evaluation of urinary retention, which does require a range of  treatment options, and is a complaint that involves a moderate risk of morbidity and mortality.  The Differential Diagnoses include BPH, UTI, false passage, abscess, etc.  Critical Interventions-    Medications  oxyCODONE-acetaminophen (PERCOCET/ROXICET) 5-325 MG per tablet 1 tablet (1 tablet Oral Given 03/09/23 1157)  morphine (PF) 4 MG/ML injection 4 mg (4 mg Intravenous Given 03/09/23 1351)    Reassessment after intervention: pain improved.    Clinical Laboratory Tests Ordered, included UA without infection.   Consult complete with Urology, Dr. Lafonda Mosses. Attempted foley placement in the ED without success. He will be down to assist.   Medical Decision Making: Summary:  Patient presents emergency department for evaluation of urinary retention.  The nurse was unable to pass a Foley catheter.  I assisted the bedside was able to pass smaller, even pediatric sized Foley catheter.  Coud was tried without success.  Urology consulted.  Reevaluation with update and discussion with patient. Urology placed the foley catheter at the bedside. See their consult note for detail. Plan for Urology follow up.   Patient's presentation is most consistent with exacerbation of chronic illness.   Disposition: discharge  ____________________________________________  FINAL CLINICAL IMPRESSION(S) / ED DIAGNOSES  Final diagnoses:  Urinary retention   Note:  This document was prepared using Dragon voice recognition software and may include unintentional dictation errors.  Alona Bene, MD, [facility]    Steve Gregg, Arlyss Repress, MD 03/19/23 [phone]

## 2023-03-17 NOTE — Progress Notes (Signed)
Chief Complaint: Patient was seen in consultation today for benign prostatic hyperplasia.   Referring Physician(s): Bell,Eugene D III  History of Present Illness: [name] [name] is a 72 y.o. male with a medical history significant for HTN, kidney stones, obesity, osteoarthritis s/p right TKA, prostate cancer dx 2018 (s/p TURP, radiation and androgen deprivation therapy) and BPH. He has a history of radiation cystitis, urinary tract infections, hematuria and urinary retention requiring foley catheter.  He has been evaluated in the ED multiple times this year for issues related to urinary retention and hematuria and has been hospitalized several times for the same. In February 2024 he was taken to the OR with Dr. Alvester Morin for a transurethral bladder tumor resection. At various times throughout the year he has required catheter irrigation secondary to clots.   He was referred to Interventional Radiology in early September for prostate artery embolization discussion. On  03/05/23 he passed a voiding trial at the Urology clinic and was doing fine until the morning of 9/29 when he was unable to void. He went to the ED for evaluation and Urology placed a 16 Fr council tip catheter.   [name] [name] presents today to discuss prostate artery embolization. He is on oxybutinin 5 mg TID and tamsulosin 0.8 mg daily.  Past Medical History:  Diagnosis Date   Allergy    seasonal allergies   Arthritis    generalized   Blood transfusion without reported diagnosis 2013   when had diverticulitis flare   BPH (benign prostatic hypertrophy)    Colon polyp    diverticular bleed with transfusion   Diverticulosis    History of kidney stones    Hyperlipidemia    on meds   Hypertension    on meds   Kidney stones    hx of 15-20 kidney stones    Obesity, unspecified    Pre-diabetes    Prostate cancer (HCC) 2020   dx 05/2019   Prostate nodule 07/27/2008    Past Surgical History:  Procedure Laterality Date    ANKLE FUSION Left 1991   COLONOSCOPY  2011   DB-F/V-miralax(good)-TICS/3 HPP   CYSTOSCOPY W/ RETROGRADES Bilateral 08/02/2022   Procedure: CYSTOSCOPY WITH BILATERAL RETROGRADE PYELOGRAM;  Surgeon: Crista Elliot, MD;  Location: [facility];  Service: Urology;  Laterality: Bilateral;   HERNIA REPAIR N/A    umbilical    POLYPECTOMY  2011   3 HPP   TOTAL KNEE ARTHROPLASTY Right 03/15/2022   Procedure: TOTAL KNEE ARTHROPLASTY;  Surgeon: Frederico Hamman, MD;  Location: [facility];  Service: Orthopedics;  Laterality: Right;   TRANSURETHRAL RESECTION OF BLADDER TUMOR N/A 08/02/2022   Procedure: TRANSURETHRAL RESECTION OF BLADDER TUMOR (TURBT);  Surgeon: Crista Elliot, MD;  Location: [facility];  Service: Urology;  Laterality: N/A;  60 MINS   TRANSURETHRAL RESECTION OF PROSTATE  2020    Allergies: Aspirin, Statins, and Gadolinium derivatives  Medications: Prior to Admission medications   Medication Sig Start Date End Date Taking? Authorizing Provider  cephALEXin (KEFLEX) 500 MG capsule Take 1 capsule (500 mg total) by mouth 3 (three) times daily. Patient not taking: Reported on 03/06/2023 02/13/23   Henderly, Britni A, PA-C  lisinopril-hydrochlorothiazide (ZESTORETIC) 20-12.5 MG tablet TAKE 1 TABLET BY MOUTH EVERY DAY Patient taking differently: Take 1 tablet by mouth daily. 11/16/22   Georgina Quint, MD  oxybutynin (DITROPAN) 5 MG tablet Take 5 mg by mouth daily. Patient not taking: Reported on 03/06/2023 01/16/23   [provider]  rosuvastatin (  CRESTOR) 10 MG tablet TAKE 1 TABLET BY MOUTH EVERY DAY 02/14/23   Georgina Quint, MD  tamsulosin (FLOMAX) 0.4 MG CAPS capsule Take 0.8 mg by mouth daily. 01/03/23   [provider]     Family History  Problem Relation Age of Onset   Diabetes Mother    Breast cancer Mother 62   Prostate cancer Father        patient did not have a relationship with his father but understands he had prostate ca   Colon polyps Father 38   Colon  cancer Father 58   Diabetes Sister    Pancreatic cancer Neg Hx    Esophageal cancer Neg Hx    Stomach cancer Neg Hx    Rectal cancer Neg Hx     Social History   Socioeconomic History   Marital status: Married    Spouse name: [name] [name]   Number of children: 2   Years of education: Not on file   Highest education level: Not on file  Occupational History   Occupation: Retired  Tobacco Use   Smoking status: Never   Smokeless tobacco: Never  Vaping Use   Vaping status: Never Used  Substance and Sexual Activity   Alcohol use: No   Drug use: No   Sexual activity: Yes  Other Topics Concern   Not on file  Social History Narrative   Lives with wife.   Social Determinants of Health   Financial Resource Strain: Medium Risk (03/06/2023)   Overall Financial Resource Strain (CARDIA)    Difficulty of Paying Living Expenses: Somewhat hard  Food Insecurity: No Food Insecurity (03/06/2023)   Hunger Vital Sign    Worried About Running Out of Food in the Last Year: Never true    Ran Out of Food in the Last Year: Never true  Transportation Needs: No Transportation Needs (03/06/2023)   PRAPARE - Administrator, Civil Service (Medical): No    Lack of Transportation (Non-Medical): No  Physical Activity: Inactive (03/06/2023)   Exercise Vital Sign    Days of Exercise per Week: 0 days    Minutes of Exercise per Session: 0 min  Stress: No Stress Concern Present (03/06/2023)   [employer] of Occupational Health - Occupational Stress Questionnaire    Feeling of Stress : Not at all  Social Connections: Moderately Integrated (03/06/2023)   Social Connection and Isolation Panel [NHANES]    Frequency of Communication with Friends and Family: More than three times a week    Frequency of Social Gatherings with Friends and Family: Once a week    Attends Religious Services: More than 4 times per year    Active Member of [employer] or Organizations: No    Attends Banker Meetings:  Never    Marital Status: Married     Review of Systems: A 12 point ROS discussed and pertinent positives are indicated in the HPI above.  All other systems are negative.  Review of Systems  Vital Signs: There were no vitals taken for this visit.  Advance Care Plan: The advanced care plan/surrogate decision maker was discussed at the time of visit and documented in the medical record.    Physical Exam  Imaging: No results found.  Labs:  CBC: Recent Labs    01/17/23 0030 01/18/23 0414 01/20/23 0812 01/28/23 1053 02/13/23 0812  WBC 7.4 4.9 4.5 6.0  --   HGB 10.3* 10.3* 10.3* 9.7* 9.5*  HCT 33.4* 33.1* 33.8* 30.1* 28.0*  PLT  157 176 177 244.0  --     COAGS: No results for input(s): "INR", "APTT" in the last 8760 hours.  BMP: Recent Labs    07/30/22 1037 01/14/23 1903 01/17/23 0030 01/18/23 0414 01/20/23 0812 01/28/23 1053 02/13/23 0812  NA 138   < > 135 139 137 137 139  K 3.7   < > 3.9 4.0 3.9 4.3 4.2  CL 103   < > 104 104 102 104 106  CO2 24  --  21* 26 27 26   --   GLUCOSE 96   < > 115* 109* 95 107* 104*  BUN 25*   < > 25* 19 19 24* 22  CALCIUM 9.6  --  9.1 9.5 9.2 10.0  --   CREATININE 1.08   < > 1.11 1.01 1.13 1.18 1.30*  GFRNONAA >60  --  >60 >60 >60  --   --    < > = values in this interval not displayed.    LIVER FUNCTION TESTS: Recent Labs    01/28/23 1053  BILITOT 0.3  AST 15  ALT 12  ALKPHOS 51  PROT 7.7  ALBUMIN 4.2    TUMOR MARKERS: No results for input(s): "AFPTM", "CEA", "CA199", "CHROMGRNA" in the last 8760 hours.  Assessment and Plan:  72 year old male with a history of BPH with urinary retention and hematuria.   Thank you for this interesting consult.  I greatly enjoyed meeting [name] [name] and look forward to participating in their care.  A copy of this report was sent to the requesting provider on this date.  Electronically Signed: Mickie Kay, NP 03/17/2023, 4:42 PM   I spent a total of 40 Minutes    in face  to face in clinical consultation, greater than 50% of which was counseling/coordinating care for benign prostatic hyperplasia

## 2023-03-19 ENCOUNTER — Ambulatory Visit
Admission: RE | Admit: 2023-03-19 | Discharge: 2023-03-19 | Disposition: A | Discharge status: Home / Self Care | Payer: Medicare Other | Source: Ambulatory Visit | Attending: Urology | Admitting: Urology

## 2023-03-19 ENCOUNTER — Other Ambulatory Visit: Payer: Self-pay | Admitting: Interventional Radiology

## 2023-03-19 DIAGNOSIS — R338 Other retention of urine: Secondary | ICD-10-CM | POA: Diagnosis not present

## 2023-03-19 DIAGNOSIS — N401 Enlarged prostate with lower urinary tract symptoms: Secondary | ICD-10-CM

## 2023-03-19 DIAGNOSIS — C61 Malignant neoplasm of prostate: Secondary | ICD-10-CM

## 2023-03-19 DIAGNOSIS — R310 Gross hematuria: Secondary | ICD-10-CM

## 2023-03-19 HISTORY — PX: IR RADIOLOGIST EVAL & MGMT: IMG5224

## 2023-03-20 ENCOUNTER — Other Ambulatory Visit (HOSPITAL_COMMUNITY): Payer: Self-pay | Admitting: Interventional Radiology

## 2023-03-20 DIAGNOSIS — N401 Enlarged prostate with lower urinary tract symptoms: Secondary | ICD-10-CM

## 2023-03-21 ENCOUNTER — Telehealth (HOSPITAL_COMMUNITY): Payer: Self-pay | Admitting: Radiology

## 2023-03-21 NOTE — Telephone Encounter (Signed)
Called pt, left VM for him to call to schedule his PAE with Dr. Elby Showers. Looking at 10/28 if pt is agreeable. JM

## 2023-03-26 ENCOUNTER — Ambulatory Visit
Admission: RE | Admit: 2023-03-26 | Discharge: 2023-03-26 | Disposition: A | Discharge status: Home / Self Care | Payer: Medicare Other | Source: Ambulatory Visit | Attending: Interventional Radiology | Admitting: Interventional Radiology

## 2023-03-26 DIAGNOSIS — N138 Other obstructive and reflux uropathy: Secondary | ICD-10-CM

## 2023-03-26 DIAGNOSIS — K5730 Diverticulosis of large intestine without perforation or abscess without bleeding: Secondary | ICD-10-CM | POA: Diagnosis not present

## 2023-03-26 MED ORDER — IOPAMIDOL (ISOVUE-370) INJECTION 76%
200.0000 mL | Freq: Once | INTRAVENOUS | Status: AC | PRN
  Administered 2023-03-26: 100 mL via INTRAVENOUS

## 2023-03-29 ENCOUNTER — Other Ambulatory Visit: Payer: Self-pay | Admitting: Emergency Medicine

## 2023-03-29 DIAGNOSIS — F418 Other specified anxiety disorders: Secondary | ICD-10-CM

## 2023-04-04 ENCOUNTER — Other Ambulatory Visit (HOSPITAL_COMMUNITY): Payer: Self-pay | Admitting: Student

## 2023-04-04 DIAGNOSIS — N400 Enlarged prostate without lower urinary tract symptoms: Secondary | ICD-10-CM

## 2023-04-06 NOTE — H&P (Signed)
Chief Complaint: Radiation cysystis with recurrent urinary retention, hematruia and chronic UTI's in the setting of BPH. Patient presents for PAE  Referring Physician(s): Dr. Modena Slater III  Supervising Physician:   Patient Status: [facility] - Out-pt  History of Present Illness: [name] [name] is a 72 y.o. male outpatient. History of HTN, obesity, OA s/p TKA. Kidney stones, prostate cancer s/p Transurethral resection and radiation (2020). Complicated by radiation cystitis, chronic UTI and  recurrent urinary retention and hematuria. S/P TURBT on 2.23.24. Patient ultimately requiring a 16 Fr Council foley catheter placed by [facility] do to his inability to void. The Patient was seen for consultation in  Interventional Radiolology Clinic  on 10.9.24with IR Attending Dr. Simonne Martinet. At that time a detailed discussion regarding the Patient's medical condition including but not limited to possible treatment options took place. Following that discussion the Patient prostate artery embolization via femoral approbate.  elected to proceed with Dr. Simonne Martinet . The Patient presents today for prostate artery embolization   Currently without any significant complaints. Patient alert and laying in bed,calm. Denies any fevers, headache, chest pain, SOB, cough, abdominal pain, nausea, vomiting or bleeding. Return precautions and treatment recommendations and follow-up discussed with the patient *** who is agreeable with the plan.     Past Medical History:  Diagnosis Date   Allergy    seasonal allergies   Arthritis    generalized   Blood transfusion without reported diagnosis 2013   when had diverticulitis flare   BPH (benign prostatic hypertrophy)    Colon polyp    diverticular bleed with transfusion   Diverticulosis    History of kidney stones    Hyperlipidemia    on meds   Hypertension    on meds   Kidney stones    hx of 15-20 kidney stones    Obesity, unspecified     Pre-diabetes    Prostate cancer (HCC) 2020   dx 05/2019   Prostate nodule 07/27/2008    Past Surgical History:  Procedure Laterality Date   ANKLE FUSION Left 1991   COLONOSCOPY  2011   DB-F/V-miralax(good)-TICS/3 HPP   CYSTOSCOPY W/ RETROGRADES Bilateral 08/02/2022   Procedure: CYSTOSCOPY WITH BILATERAL RETROGRADE PYELOGRAM;  Surgeon: Crista Elliot, MD;  Location: [facility];  Service: Urology;  Laterality: Bilateral;   HERNIA REPAIR N/A    umbilical    IR RADIOLOGIST EVAL & MGMT  03/19/2023   POLYPECTOMY  2011   3 HPP   TOTAL KNEE ARTHROPLASTY Right 03/15/2022   Procedure: TOTAL KNEE ARTHROPLASTY;  Surgeon: Frederico Hamman, MD;  Location: [facility];  Service: Orthopedics;  Laterality: Right;   TRANSURETHRAL RESECTION OF BLADDER TUMOR N/A 08/02/2022   Procedure: TRANSURETHRAL RESECTION OF BLADDER TUMOR (TURBT);  Surgeon: Crista Elliot, MD;  Location: [facility];  Service: Urology;  Laterality: N/A;  60 MINS   TRANSURETHRAL RESECTION OF PROSTATE  2020    Allergies: Aspirin, Statins, and Gadolinium derivatives  Medications: Prior to Admission medications   Medication Sig Start Date End Date Taking? Authorizing Provider  ALPRAZolam Prudy Feeler) 0.5 MG tablet TAKE 1 TABLET BY MOUTH EVERY DAY AS NEEDED FOR ANXIETY 03/31/23   Sagardia, Eilleen Kempf, MD  cephALEXin (KEFLEX) 500 MG capsule Take 1 capsule (500 mg total) by mouth 3 (three) times daily. Patient not taking: Reported on 03/06/2023 02/13/23   Henderly, Britni A, PA-C  lisinopril-hydrochlorothiazide (ZESTORETIC) 20-12.5 MG tablet TAKE 1 TABLET BY MOUTH EVERY DAY Patient taking differently: Take 1 tablet by  mouth daily. 11/16/22   Georgina Quint, MD  oxybutynin (DITROPAN) 5 MG tablet Take 5 mg by mouth daily. Patient not taking: Reported on 03/06/2023 01/16/23   [provider]  rosuvastatin (CRESTOR) 10 MG tablet TAKE 1 TABLET BY MOUTH EVERY DAY 02/14/23   Georgina Quint, MD  tamsulosin (FLOMAX) 0.4 MG CAPS capsule Take 0.8  mg by mouth daily. 01/03/23   [provider]     Family History  Problem Relation Age of Onset   Diabetes Mother    Breast cancer Mother 93   Prostate cancer Father        patient did not have a relationship with his father but understands he had prostate ca   Colon polyps Father 51   Colon cancer Father 39   Diabetes Sister    Pancreatic cancer Neg Hx    Esophageal cancer Neg Hx    Stomach cancer Neg Hx    Rectal cancer Neg Hx     Social History   Socioeconomic History   Marital status: Married    Spouse name: [name]   Number of children: 2   Years of education: Not on file   Highest education level: Not on file  Occupational History   Occupation: Retired  Tobacco Use   Smoking status: Never   Smokeless tobacco: Never  Vaping Use   Vaping status: Never Used  Substance and Sexual Activity   Alcohol use: No   Drug use: No   Sexual activity: Yes  Other Topics Concern   Not on file  Social History Narrative   Lives with wife.   Social Determinants of Health   Financial Resource Strain: Medium Risk (03/06/2023)   Overall Financial Resource Strain (CARDIA)    Difficulty of Paying Living Expenses: Somewhat hard  Food Insecurity: No Food Insecurity (03/06/2023)   Hunger Vital Sign    Worried About Running Out of Food in the Last Year: Never true    Ran Out of Food in the Last Year: Never true  Transportation Needs: No Transportation Needs (03/06/2023)   PRAPARE - Administrator, Civil Service (Medical): No    Lack of Transportation (Non-Medical): No  Physical Activity: Inactive (03/06/2023)   Exercise Vital Sign    Days of Exercise per Week: 0 days    Minutes of Exercise per Session: 0 min  Stress: No Stress Concern Present (03/06/2023)   [employer] of Occupational Health - Occupational Stress Questionnaire    Feeling of Stress : Not at all  Social Connections: Moderately Integrated (03/06/2023)   Social Connection and Isolation Panel  [NHANES]    Frequency of Communication with Friends and Family: More than three times a week    Frequency of Social Gatherings with Friends and Family: Once a week    Attends Religious Services: More than 4 times per year    Active Member of [employer] or Organizations: No    Attends Banker Meetings: Never    Marital Status: Married    ECOG Status: {CHL ONC ECOG ZO:[id number]}  Review of Systems: A 12 point ROS discussed and pertinent positives are indicated in the HPI above.  All other systems are negative.  Review of Systems  Vital Signs: There were no vitals taken for this visit.  Advance Care Plan: {Advance Care WJXB:14782}    Physical Exam  Imaging: IR Radiologist Eval & Mgmt  Result Date: 03/19/2023 EXAM: NEW PATIENT OFFICE VISIT CHIEF COMPLAINT: See Epic note. HISTORY OF PRESENT  ILLNESS: See Epic note. REVIEW OF SYSTEMS: See Epic note. PHYSICAL EXAMINATION: See Epic note. ASSESSMENT AND PLAN: See Epic note. Marliss Coots, MD [facility] Electronically Signed   By: Marliss Coots M.D.   On: 03/19/2023 09:25    Labs:  CBC: Recent Labs    01/17/23 0030 01/18/23 0414 01/20/23 0812 01/28/23 1053 02/13/23 0812  WBC 7.4 4.9 4.5 6.0  --   HGB 10.3* 10.3* 10.3* 9.7* 9.5*  HCT 33.4* 33.1* 33.8* 30.1* 28.0*  PLT 157 176 177 244.0  --     COAGS: No results for input(s): "INR", "APTT" in the last 8760 hours.  BMP: Recent Labs    07/30/22 1037 01/14/23 1903 01/17/23 0030 01/18/23 0414 01/20/23 0812 01/28/23 1053 02/13/23 0812  NA 138   < > 135 139 137 137 139  K 3.7   < > 3.9 4.0 3.9 4.3 4.2  CL 103   < > 104 104 102 104 106  CO2 24  --  21* 26 27 26   --   GLUCOSE 96   < > 115* 109* 95 107* 104*  BUN 25*   < > 25* 19 19 24* 22  CALCIUM 9.6  --  9.1 9.5 9.2 10.0  --   CREATININE 1.08   < > 1.11 1.01 1.13 1.18 1.30*  GFRNONAA >60  --  >60 >60 >60  --   --    < > = values in this interval not  displayed.    LIVER FUNCTION TESTS: Recent Labs    01/28/23 1053  BILITOT 0.3  AST 15  ALT 12  ALKPHOS 51  PROT 7.7  ALBUMIN 4.2      Assessment and Plan:  72 y.o. male outpatient. History of HTN, obesity, OA s/p TKA. Kidney stones, prostate cancer s/p Transurethral resection and radiation (2020). Complicated by radiation cystitis, chronic UTI and  recurrent urinary retention and hematuria. S/P TURBT on 2.23.24. Patient ultimately requiring a 16 Fr Council foley catheter placed by [facility] do to his inability to void. The Patient was seen for consultation in  Interventional Radiolology Clinic  on 10.9.24with IR Attending Dr. Simonne Martinet. At that time a detailed discussion regarding the Patient's medical condition including but not limited to possible treatment options took place. Following that discussion the Patient prostate artery embolization via femoral approbate.  elected to proceed with Dr. Simonne Martinet . The Patient presents today for prostate artery embolization    CT Angio pelvis from 10.16.24 reads *** All labs and medications are within acceptable parameters.Allergies include gadolinium derivatives reaction nausea and vomiting. Patient has been NPO since midnight.  The Risks and benefits of embolization were discussed with the patient including, but not limited to bleeding, infection, vascular injury, post operative pain, or contrast induced renal failure.  This procedure involves the use of X-rays and because of the nature of the planned procedure, it is possible that we will have prolonged use of X-ray fluoroscopy.  Potential radiation risks to you include (but are not limited to) the following: - A slightly elevated risk for cancer several years later in life. This risk is typically less than 0.5% percent. This risk is low in comparison to the normal incidence of human cancer, which is 33% for women and 50% for men according to the American Cancer Society. -  Radiation induced injury can include skin redness, resembling a rash, tissue breakdown / ulcers and hair loss (which can be temporary or permanent).  The likelihood of either of these occurring depends on the difficulty of the procedure and whether you are sensitive to radiation due to previous procedures, disease, or genetic conditions.   IF your procedure requires a prolonged use of radiation, you will be notified and given written instructions for further action.  It is your responsibility to monitor the irradiated area for the 2 weeks following the procedure and to notify your physician if you are concerned that you have suffered a radiation induced injury.    All of the patient's questions were answered, patient is agreeable to proceed. Consent signed and in chart.   Thank you for this interesting consult.  I greatly enjoyed meeting Meet Damian and look forward to participating in their care.  A copy of this report was sent to the requesting provider on this date.  Electronically Signed: Alene Mires, NP 04/06/2023, 4:46 PM   I spent a total of {New ZOXW:[id number]} {New Out-Pt:[id number]}  {Established Out-Pt:[id number]} in face to face in clinical consultation, greater than 50% of which was counseling/coordinating care for ***

## 2023-04-07 ENCOUNTER — Other Ambulatory Visit: Payer: Self-pay

## 2023-04-07 ENCOUNTER — Other Ambulatory Visit (HOSPITAL_COMMUNITY): Payer: Self-pay | Admitting: Interventional Radiology

## 2023-04-07 ENCOUNTER — Ambulatory Visit (HOSPITAL_COMMUNITY)
Admission: RE | Admit: 2023-04-07 | Discharge: 2023-04-07 | Disposition: A | Discharge status: Home / Self Care | Payer: Medicare Other | Source: Ambulatory Visit | Attending: Interventional Radiology | Admitting: Interventional Radiology

## 2023-04-07 DIAGNOSIS — Z923 Personal history of irradiation: Secondary | ICD-10-CM | POA: Diagnosis not present

## 2023-04-07 DIAGNOSIS — Z6838 Body mass index (BMI) 38.0-38.9, adult: Secondary | ICD-10-CM | POA: Insufficient documentation

## 2023-04-07 DIAGNOSIS — N401 Enlarged prostate with lower urinary tract symptoms: Secondary | ICD-10-CM | POA: Insufficient documentation

## 2023-04-07 DIAGNOSIS — E669 Obesity, unspecified: Secondary | ICD-10-CM | POA: Insufficient documentation

## 2023-04-07 DIAGNOSIS — N138 Other obstructive and reflux uropathy: Secondary | ICD-10-CM

## 2023-04-07 DIAGNOSIS — Z8546 Personal history of malignant neoplasm of prostate: Secondary | ICD-10-CM | POA: Diagnosis not present

## 2023-04-07 DIAGNOSIS — N400 Enlarged prostate without lower urinary tract symptoms: Secondary | ICD-10-CM

## 2023-04-07 DIAGNOSIS — I10 Essential (primary) hypertension: Secondary | ICD-10-CM | POA: Diagnosis not present

## 2023-04-07 DIAGNOSIS — N3041 Irradiation cystitis with hematuria: Secondary | ICD-10-CM | POA: Diagnosis not present

## 2023-04-07 DIAGNOSIS — Z96651 Presence of right artificial knee joint: Secondary | ICD-10-CM | POA: Diagnosis not present

## 2023-04-07 DIAGNOSIS — R338 Other retention of urine: Secondary | ICD-10-CM | POA: Diagnosis not present

## 2023-04-07 HISTORY — PX: IR 3D INDEPENDENT WKST: IMG2385

## 2023-04-07 HISTORY — PX: IR US GUIDE VASC ACCESS RIGHT: IMG2390

## 2023-04-07 HISTORY — PX: IR ANGIOGRAM SELECTIVE EACH ADDITIONAL VESSEL: IMG667

## 2023-04-07 HISTORY — PX: IR EMBO ARTERIAL NOT HEMORR HEMANG INC GUIDE ROADMAPPING: IMG5448

## 2023-04-07 HISTORY — PX: IR ANGIOGRAM PELVIS SELECTIVE OR SUPRASELECTIVE: IMG661

## 2023-04-07 LAB — CBC
HCT: 33 % — ABNORMAL LOW (ref 39.0–52.0)
Hemoglobin: 10.2 g/dL — ABNORMAL LOW (ref 13.0–17.0)
MCH: 25.4 pg — ABNORMAL LOW (ref 26.0–34.0)
MCHC: 30.9 g/dL (ref 30.0–36.0)
MCV: 82.3 fL (ref 80.0–100.0)
Platelets: 181 10*3/uL (ref 150–400)
RBC: 4.01 MIL/uL — ABNORMAL LOW (ref 4.22–5.81)
RDW: 15.4 % (ref 11.5–15.5)
WBC: 4.7 10*3/uL (ref 4.0–10.5)
nRBC: 0 % (ref 0.0–0.2)

## 2023-04-07 LAB — BASIC METABOLIC PANEL
Anion gap: 8 (ref 5–15)
BUN: 22 mg/dL (ref 8–23)
CO2: 26 mmol/L (ref 22–32)
Calcium: 9.6 mg/dL (ref 8.9–10.3)
Chloride: 107 mmol/L (ref 98–111)
Creatinine, Ser: 1.22 mg/dL (ref 0.61–1.24)
GFR, Estimated: >60 mL/min (ref >60)
Glucose, Bld: 103 mg/dL — ABNORMAL HIGH (ref 70–99)
Potassium: 3.7 mmol/L (ref 3.5–5.1)
Sodium: 141 mmol/L (ref 135–145)

## 2023-04-07 LAB — GLUCOSE, CAPILLARY
Glucose-Capillary: 109 mg/dL — ABNORMAL HIGH (ref 70–99)
Glucose-Capillary: 107 mg/dL — ABNORMAL HIGH (ref 70–99)

## 2023-04-07 LAB — PROTIME-INR
INR: 1 (ref 0.8–1.2)
Prothrombin Time: 13.2 s (ref 11.4–15.2)

## 2023-04-07 MED ORDER — MIDAZOLAM HCL 2 MG/2ML IJ SOLN
INTRAMUSCULAR | Status: AC
  Filled 2023-04-07: qty 4

## 2023-04-07 MED ORDER — SOLIFENACIN SUCCINATE 5 MG PO TABS: 5.0000 mg | ORAL_TABLET | Freq: Every day | ORAL | 0 refills | Status: AC

## 2023-04-07 MED ORDER — CIPROFLOXACIN HCL 500 MG PO TABS: 500.0000 mg | ORAL_TABLET | Freq: Two times a day (BID) | ORAL | 0 refills | Status: AC

## 2023-04-07 MED ORDER — LIDOCAINE-EPINEPHRINE 2 %-1:100000 IJ SOLN
20.0000 mL | Freq: Once | INTRAMUSCULAR | Status: AC
  Administered 2023-04-07: 10 mL
  Filled 2023-04-07: qty 20

## 2023-04-07 MED ORDER — FENTANYL CITRATE (PF) 100 MCG/2ML IJ SOLN
INTRAMUSCULAR | Status: AC | PRN
Start: 2023-04-07 — End: 2023-04-07
  Administered 2023-04-07: 50 ug via INTRAVENOUS
  Administered 2023-04-07: 25 ug via INTRAVENOUS
  Administered 2023-04-07 (×4): 50 ug via INTRAVENOUS

## 2023-04-07 MED ORDER — METHYLPREDNISOLONE 4 MG PO TBPK: ORAL_TABLET | ORAL | 0 refills | Status: DC

## 2023-04-07 MED ORDER — PREDNISONE 20 MG PO TABS
20.0000 mg | ORAL_TABLET | Freq: Once | ORAL | Status: AC
  Administered 2023-04-07: 20 mg via ORAL
  Filled 2023-04-07: qty 1

## 2023-04-07 MED ORDER — SODIUM CHLORIDE 0.9 % IV SOLN: INTRAVENOUS | Status: DC

## 2023-04-07 MED ORDER — CIPROFLOXACIN IN D5W 400 MG/200ML IV SOLN
400.0000 mg | INTRAVENOUS | Status: DC
  Filled 2023-04-07: qty 200

## 2023-04-07 MED ORDER — NITROGLYCERIN 1 MG/10 ML FOR IR/CATH LAB
INTRA_ARTERIAL | Status: AC
  Filled 2023-04-07: qty 10

## 2023-04-07 MED ORDER — LIDOCAINE HCL 1 % IJ SOLN
INTRAMUSCULAR | Status: AC
  Filled 2023-04-07: qty 20

## 2023-04-07 MED ORDER — PHENAZOPYRIDINE HCL 100 MG PO TABS: 100.0000 mg | ORAL_TABLET | Freq: Three times a day (TID) | ORAL | 0 refills | Status: AC | PRN

## 2023-04-07 MED ORDER — FENTANYL CITRATE (PF) 100 MCG/2ML IJ SOLN
INTRAMUSCULAR | Status: AC
  Filled 2023-04-07: qty 4

## 2023-04-07 MED ORDER — CIPROFLOXACIN IN D5W 200 MG/100ML IV SOLN
INTRAVENOUS | Status: AC | PRN
  Administered 2023-04-07: 400 mg via INTRAVENOUS

## 2023-04-07 MED ORDER — IOHEXOL 300 MG/ML  SOLN
150.0000 mL | Freq: Once | INTRAMUSCULAR | Status: AC | PRN
  Administered 2023-04-07: 80 mL via INTRA_ARTERIAL

## 2023-04-07 MED ORDER — MIDAZOLAM HCL 2 MG/2ML IJ SOLN
INTRAMUSCULAR | Status: AC | PRN
Start: 2023-04-07 — End: 2023-04-07
  Administered 2023-04-07 (×4): .5 mg via INTRAVENOUS
  Administered 2023-04-07: 1 mg via INTRAVENOUS

## 2023-04-07 MED ORDER — FENTANYL CITRATE (PF) 100 MCG/2ML IJ SOLN
INTRAMUSCULAR | Status: AC
  Filled 2023-04-07: qty 2

## 2023-04-07 MED ORDER — IOHEXOL 300 MG/ML  SOLN: 100.0000 mL | Freq: Once | INTRAMUSCULAR | Status: DC | PRN

## 2023-04-07 MED ORDER — LIDOCAINE-EPINEPHRINE 1 %-1:100000 IJ SOLN
INTRAMUSCULAR | Status: AC
  Filled 2023-04-07: qty 1

## 2023-04-07 MED ORDER — NITROGLYCERIN 1 MG/10 ML FOR IR/CATH LAB
INTRA_ARTERIAL | Status: AC | PRN
  Administered 2023-04-07 (×2): 100 ug via INTRA_ARTERIAL
  Administered 2023-04-07: 200 ug via INTRA_ARTERIAL

## 2023-04-07 NOTE — Progress Notes (Signed)
Pt ambulated in hallway with no signs of oozing from right groin site

## 2023-04-07 NOTE — Procedures (Signed)
Interventional Radiology Procedure Note  Procedure: Prostate artery embolization   Findings: Please refer to procedural dictation for full description. Bilateral PAE, 400 micron embospheres. Right CFA 6 Fr Angioseal closure.  Complications: None immediate  Estimated Blood Loss: <5 mL  Recommendations: Strict 2 hour bedrest (1 hour head of bed flat, 2 hour head of bed up to 30 degrees). IR will arrange for outpatient clinic follow up. Recommend 1 month Urology follow up for voiding trial.   Marliss Coots, MD

## 2023-04-14 ENCOUNTER — Telehealth: Payer: Self-pay

## 2023-04-14 NOTE — Telephone Encounter (Signed)
Transition Care Management Unsuccessful Follow-up Telephone Call  Date of discharge and from where:  03/09/2023 [facility]  Attempts:  1st Attempt  Reason for unsuccessful TCM follow-up call:  Missing or invalid number attempted to call twice unable to leave message.  Kamiryn Bezanson [facility]  [facility], [facility] Guide Direct Dial: [phone]  Website: [email or web address]

## 2023-04-15 ENCOUNTER — Telehealth (HOSPITAL_COMMUNITY): Payer: Self-pay | Admitting: Radiology

## 2023-04-15 NOTE — Telephone Encounter (Signed)
Pt called asking when his Foley catheter could be removed. [facility] is on vaca this week, so I reached out to [employer], [place]. Per Bruning the pt is scheduled to see his urologist tomorrow and that is the dr who would make that decision. Called pt back and relayed this info to him. He will speak with his urologist tomorrow about this. JM

## 2023-04-16 ENCOUNTER — Other Ambulatory Visit: Payer: Self-pay | Admitting: Interventional Radiology

## 2023-04-16 DIAGNOSIS — N401 Enlarged prostate with lower urinary tract symptoms: Secondary | ICD-10-CM

## 2023-04-17 DIAGNOSIS — N3041 Irradiation cystitis with hematuria: Secondary | ICD-10-CM | POA: Diagnosis not present

## 2023-04-17 DIAGNOSIS — R338 Other retention of urine: Secondary | ICD-10-CM | POA: Diagnosis not present

## 2023-05-01 ENCOUNTER — Ambulatory Visit: Payer: Medicare Other | Admitting: Emergency Medicine

## 2023-05-01 ENCOUNTER — Encounter: Payer: Self-pay | Admitting: Emergency Medicine

## 2023-05-01 VITALS — BP 118/70 | HR 73 | Temp 97.6°F | Ht 74.0 in | Wt 307.8 lb

## 2023-05-01 DIAGNOSIS — Z23 Encounter for immunization: Secondary | ICD-10-CM | POA: Diagnosis not present

## 2023-05-01 DIAGNOSIS — E785 Hyperlipidemia, unspecified: Secondary | ICD-10-CM | POA: Diagnosis not present

## 2023-05-01 DIAGNOSIS — C61 Malignant neoplasm of prostate: Secondary | ICD-10-CM

## 2023-05-01 DIAGNOSIS — R7303 Prediabetes: Secondary | ICD-10-CM

## 2023-05-01 DIAGNOSIS — I10 Essential (primary) hypertension: Secondary | ICD-10-CM

## 2023-05-01 DIAGNOSIS — Z6838 Body mass index (BMI) 38.0-38.9, adult: Secondary | ICD-10-CM

## 2023-05-01 DIAGNOSIS — F5104 Psychophysiologic insomnia: Secondary | ICD-10-CM

## 2023-05-01 MED ORDER — ZOLPIDEM TARTRATE 5 MG PO TABS: 5.0000 mg | ORAL_TABLET | Freq: Every evening | ORAL | 1 refills | Status: DC | PRN

## 2023-05-01 NOTE — Assessment & Plan Note (Signed)
Diet and nutrition discussed.  Advised to decrease amount of daily carbohydrate intake and daily calories and increase amount of plant based protein in his diet 

## 2023-05-01 NOTE — Assessment & Plan Note (Signed)
BP Readings from Last 3 Encounters:  05/01/23 118/70  04/07/23 126/66  03/19/23 135/77   Well-controlled hypertension Continues Zestoretic 20-12.5 mg once a day Cardiovascular risks associated with hypertension discussed

## 2023-05-01 NOTE — Assessment & Plan Note (Signed)
Stable.  Follows up with urologist

## 2023-05-01 NOTE — Assessment & Plan Note (Signed)
Chronic stable condition Continues rosuvastatin 10 mg daily

## 2023-05-01 NOTE — Patient Instructions (Signed)
Health Maintenance After Age 70 After age 70, you are at a higher risk for certain long-term diseases and infections as well as injuries from falls. Falls are a major cause of broken bones and head injuries in people who are older than age 70. Getting regular preventive care can help to keep you healthy and well. Preventive care includes getting regular testing and making lifestyle changes as recommended by your health care provider. Talk with your health care provider about: Which screenings and tests you should have. A screening is a test that checks for a disease when you have no symptoms. A diet and exercise plan that is right for you. What should I know about screenings and tests to prevent falls? Screening and testing are the best ways to find a health problem early. Early diagnosis and treatment give you the best chance of managing medical conditions that are common after age 70. Certain conditions and lifestyle choices may make you more likely to have a fall. Your health care provider may recommend: Regular vision checks. Poor vision and conditions such as cataracts can make you more likely to have a fall. If you wear glasses, make sure to get your prescription updated if your vision changes. Medicine review. Work with your health care provider to regularly review all of the medicines you are taking, including over-the-counter medicines. Ask your health care provider about any side effects that may make you more likely to have a fall. Tell your health care provider if any medicines that you take make you feel dizzy or sleepy. Strength and balance checks. Your health care provider may recommend certain tests to check your strength and balance while standing, walking, or changing positions. Foot health exam. Foot pain and numbness, as well as not wearing proper footwear, can make you more likely to have a fall. Screenings, including: Osteoporosis screening. Osteoporosis is a condition that causes  the bones to get weaker and break more easily. Blood pressure screening. Blood pressure changes and medicines to control blood pressure can make you feel dizzy. Depression screening. You may be more likely to have a fall if you have a fear of falling, feel depressed, or feel unable to do activities that you used to do. Alcohol use screening. Using too much alcohol can affect your balance and may make you more likely to have a fall. Follow these instructions at home: Lifestyle Do not drink alcohol if: Your health care provider tells you not to drink. If you drink alcohol: Limit how much you have to: 0-1 drink a day for women. 0-2 drinks a day for men. Know how much alcohol is in your drink. In the [place] one drink equals one 12 oz bottle of beer (355 mL), one 5 oz glass of wine (148 mL), or one 1 oz glass of hard liquor (44 mL). Do not use any products that contain nicotine or tobacco. These products include cigarettes, chewing tobacco, and vaping devices, such as e-cigarettes. If you need help quitting, ask your health care provider. Activity  Follow a regular exercise program to stay fit. This will help you maintain your balance. Ask your health care provider what types of exercise are appropriate for you. If you need a cane or walker, use it as recommended by your health care provider. Wear supportive shoes that have nonskid soles. Safety  Remove any tripping hazards, such as rugs, cords, and clutter. Install safety equipment such as grab bars in bathrooms and safety rails on stairs. Keep rooms and walkways   well-lit. General instructions Talk with your health care provider about your risks for falling. Tell your health care provider if: You fall. Be sure to tell your health care provider about all falls, even ones that seem minor. You feel dizzy, tiredness (fatigue), or off-balance. Take over-the-counter and prescription medicines only as told by your health care provider. These include  supplements. Eat a healthy diet and maintain a healthy weight. A healthy diet includes low-fat dairy products, low-fat (lean) meats, and fiber from whole grains, beans, and lots of fruits and vegetables. Stay current with your vaccines. Schedule regular health, dental, and eye exams. Summary Having a healthy lifestyle and getting preventive care can help to protect your health and wellness after age 70. Screening and testing are the best way to find a health problem early and help you avoid having a fall. Early diagnosis and treatment give you the best chance for managing medical conditions that are more common for people who are older than age 70. Falls are a major cause of broken bones and head injuries in people who are older than age 70. Take precautions to prevent a fall at home. Work with your health care provider to learn what changes you can make to improve your health and wellness and to prevent falls. This information is not intended to replace advice given to you by your health care provider. Make sure you discuss any questions you have with your health care provider. Document Revised: 10/16/2020 Document Reviewed: 10/16/2020 Elsevier Patient Education  2024 Elsevier Inc.  

## 2023-05-01 NOTE — Progress Notes (Signed)
[name] [name] 72 y.o.   Chief Complaint  Patient presents with   Follow-up    6 month f/u. Patient states the alprazolam isn't working for him    HISTORY OF PRESENT ILLNESS: This is a 72 y.o. male A1A here for 94-month follow-up of chronic medical conditions Recently diagnosed with prostate cancer.  Had embolization of prostate procedure.  Has indwelling Foley catheter.  Has follow-up appointment with urologist next week. Has been using alprazolam to help him sleep but not working. No other complaints or medical concerns today.  Ambien  HPI   Prior to Admission medications   Medication Sig Start Date End Date Taking? Authorizing Provider  ALPRAZolam Prudy Feeler) 0.5 MG tablet TAKE 1 TABLET BY MOUTH EVERY DAY AS NEEDED FOR ANXIETY 03/31/23  Yes Selisa Tensley, Eilleen Kempf, MD  lisinopril-hydrochlorothiazide (ZESTORETIC) 20-12.5 MG tablet TAKE 1 TABLET BY MOUTH EVERY DAY Patient taking differently: Take 1 tablet by mouth daily. 11/16/22  Yes Altonio Schwertner, Eilleen Kempf, MD  rosuvastatin (CRESTOR) 10 MG tablet TAKE 1 TABLET BY MOUTH EVERY DAY 02/14/23  Yes Marino Rogerson, Eilleen Kempf, MD  tamsulosin (FLOMAX) 0.4 MG CAPS capsule Take 0.8 mg by mouth daily. 01/03/23  Yes [provider]  cephALEXin (KEFLEX) 500 MG capsule Take 1 capsule (500 mg total) by mouth 3 (three) times daily. Patient not taking: Reported on 03/06/2023 02/13/23   Henderly, Britni A, PA-C  methylPREDNISolone (MEDROL DOSEPAK) 4 MG TBPK tablet Dispense as directed. 6 day taper dose Patient not taking: Reported on 05/01/2023 04/07/23   Mickie Kay, NP  oxybutynin (DITROPAN) 5 MG tablet Take 5 mg by mouth daily. Patient not taking: Reported on 03/06/2023 01/16/23   [provider]    Allergies  Allergen Reactions   Aspirin     REACTION: upsets stomach   Statins     REACTION: UPSETS STOMACH   Gadolinium Derivatives Nausea And Vomiting    Pt was given 20 ml multihance and became nauseated and vomited several times.      Patient Active Problem List   Diagnosis Date Noted   Urinary retention 01/17/2023   Obstruction of Foley catheter (HCC) 01/17/2023   S/P total knee arthroplasty, right 03/15/2022   Body mass index (BMI) of 38.0-38.9 in adult 01/10/2020   Diverticulosis 01/10/2020   Prediabetes 01/10/2020   Dyslipidemia 01/10/2020   Malignant neoplasm of prostate (HCC) 06/08/2019   Prostate enlargement 08/29/2017   Gout 10/25/2014   Essential hypertension    Benign prostatic hyperplasia    Kidney stones    Prostate nodule     Past Medical History:  Diagnosis Date   Allergy    seasonal allergies   Arthritis    generalized   Blood transfusion without reported diagnosis 2013   when had diverticulitis flare   BPH (benign prostatic hypertrophy)    Colon polyp    diverticular bleed with transfusion   Diverticulosis    History of kidney stones    Hyperlipidemia    on meds   Hypertension    on meds   Kidney stones    hx of 15-20 kidney stones    Obesity, unspecified    Pre-diabetes    Prostate cancer (HCC) 2020   dx 05/2019   Prostate nodule 07/27/2008    Past Surgical History:  Procedure Laterality Date   ANKLE FUSION Left 1991   COLONOSCOPY  2011   DB-F/V-miralax(good)-TICS/3 HPP   CYSTOSCOPY W/ RETROGRADES Bilateral 08/02/2022   Procedure: CYSTOSCOPY WITH BILATERAL RETROGRADE PYELOGRAM;  Surgeon: Ray Church  III, MD;  Location: [facility];  Service: Urology;  Laterality: Bilateral;   HERNIA REPAIR N/A    umbilical    IR 3D INDEPENDENT WKST  04/07/2023   IR ANGIOGRAM PELVIS SELECTIVE OR SUPRASELECTIVE  04/07/2023   IR ANGIOGRAM SELECTIVE EACH ADDITIONAL VESSEL  04/07/2023   IR ANGIOGRAM SELECTIVE EACH ADDITIONAL VESSEL  04/07/2023   IR EMBO ARTERIAL NOT HEMORR HEMANG INC GUIDE ROADMAPPING  04/07/2023   IR RADIOLOGIST EVAL & MGMT  03/19/2023   IR US GUIDE VASC ACCESS RIGHT  04/07/2023   POLYPECTOMY  2011   3 HPP   TOTAL KNEE ARTHROPLASTY Right 03/15/2022   Procedure: TOTAL  KNEE ARTHROPLASTY;  Surgeon: Frederico Hamman, MD;  Location: [facility];  Service: Orthopedics;  Laterality: Right;   TRANSURETHRAL RESECTION OF BLADDER TUMOR N/A 08/02/2022   Procedure: TRANSURETHRAL RESECTION OF BLADDER TUMOR (TURBT);  Surgeon: Crista Elliot, MD;  Location: [facility];  Service: Urology;  Laterality: N/A;  60 MINS   TRANSURETHRAL RESECTION OF PROSTATE  2020    Social History   Socioeconomic History   Marital status: Married    Spouse name: [name]   Number of children: 2   Years of education: Not on file   Highest education level: Not on file  Occupational History   Occupation: Retired  Tobacco Use   Smoking status: Never   Smokeless tobacco: Never  Vaping Use   Vaping status: Never Used  Substance and Sexual Activity   Alcohol use: No   Drug use: No   Sexual activity: Yes  Other Topics Concern   Not on file  Social History Narrative   Lives with wife.   Social Determinants of Health   Financial Resource Strain: Medium Risk (03/06/2023)   Overall Financial Resource Strain (CARDIA)    Difficulty of Paying Living Expenses: Somewhat hard  Food Insecurity: No Food Insecurity (03/06/2023)   Hunger Vital Sign    Worried About Running Out of Food in the Last Year: Never true    Ran Out of Food in the Last Year: Never true  Transportation Needs: No Transportation Needs (03/06/2023)   PRAPARE - Administrator, Civil Service (Medical): No    Lack of Transportation (Non-Medical): No  Physical Activity: Inactive (03/06/2023)   Exercise Vital Sign    Days of Exercise per Week: 0 days    Minutes of Exercise per Session: 0 min  Stress: No Stress Concern Present (03/06/2023)   [employer] of Occupational Health - Occupational Stress Questionnaire    Feeling of Stress : Not at all  Social Connections: Moderately Integrated (03/06/2023)   Social Connection and Isolation Panel [NHANES]    Frequency of Communication with Friends and Family: More than three  times a week    Frequency of Social Gatherings with Friends and Family: Once a week    Attends Religious Services: More than 4 times per year    Active Member of [employer] or Organizations: No    Attends Banker Meetings: Never    Marital Status: Married  Catering manager Violence: Not At Risk (03/06/2023)   Humiliation, Afraid, Rape, and Kick questionnaire    Fear of Current or Ex-Partner: No    Emotionally Abused: No    Physically Abused: No    Sexually Abused: No    Family History  Problem Relation Age of Onset   Diabetes Mother    Breast cancer Mother 65   Prostate cancer Father  patient did not have a relationship with his father but understands he had prostate ca   Colon polyps Father 81   Colon cancer Father 39   Diabetes Sister    Pancreatic cancer Neg Hx    Esophageal cancer Neg Hx    Stomach cancer Neg Hx    Rectal cancer Neg Hx      Review of Systems  Constitutional: Negative.  Negative for chills and fever.  HENT: Negative.  Negative for congestion and sore throat.   Respiratory: Negative.  Negative for cough and shortness of breath.   Cardiovascular: Negative.  Negative for chest pain and palpitations.  Gastrointestinal:  Negative for abdominal pain, diarrhea, nausea and vomiting.  Genitourinary:  Negative for hematuria.  Skin: Negative.  Negative for rash.  Neurological: Negative.  Negative for dizziness and headaches.  Psychiatric/Behavioral:  The patient has insomnia.   All other systems reviewed and are negative.   Vitals:   05/01/23 0831  BP: 118/70  Pulse: 73  Temp: 97.6 F (36.4 C)  SpO2: 95%    Physical Exam Vitals reviewed.  Constitutional:      Appearance: Normal appearance.  HENT:     Head: Normocephalic.     Mouth/Throat:     Mouth: Mucous membranes are moist.     Pharynx: Oropharynx is clear.  Eyes:     Extraocular Movements: Extraocular movements intact.     Conjunctiva/sclera: Conjunctivae normal.     Pupils:  Pupils are equal, round, and reactive to light.  Cardiovascular:     Rate and Rhythm: Normal rate and regular rhythm.     Pulses: Normal pulses.     Heart sounds: Normal heart sounds.  Pulmonary:     Effort: Pulmonary effort is normal.     Breath sounds: Normal breath sounds.  Musculoskeletal:     Cervical back: No tenderness.  Lymphadenopathy:     Cervical: No cervical adenopathy.  Skin:    General: Skin is warm and dry.     Capillary Refill: Capillary refill takes less than 2 seconds.  Neurological:     General: No focal deficit present.     Mental Status: He is alert and oriented to person, place, and time.  Psychiatric:        Mood and Affect: Mood normal.        Behavior: Behavior normal.      ASSESSMENT & PLAN: A total of 45 minutes was spent with the patient and counseling/coordination of care regarding preparing for this visit, review of most recent office visit notes, review of multiple chronic medical conditions under management, review of all medications, review of most recent blood work results, cardiovascular risks associated with hypertension and dyslipidemia, education on nutrition, review of health maintenance items, prognosis, documentation, and need for follow-up.  Problem List Items Addressed This Visit       Cardiovascular and Mediastinum   Essential hypertension - Primary    BP Readings from Last 3 Encounters:  05/01/23 118/70  04/07/23 126/66  03/19/23 135/77   Well-controlled hypertension Continues Zestoretic 20-12.5 mg once a day Cardiovascular risks associated with hypertension discussed          Genitourinary   Malignant neoplasm of prostate (HCC)    Stable.  Follows up with urologist        Other   Body mass index (BMI) of 38.0-38.9 in adult    Diet and patient discussed Cardiovascular risks associated with obesity discussed Advised to decrease amount of daily carbohydrate intake  and daily calories and increase amount of plant-based  protein diet Benefits of exercise discussed      Prediabetes    Diet and nutrition discussed Advised to decrease amount of daily carbohydrate intake and daily calories and increase amount of plant based protein in his diet      Dyslipidemia    Chronic stable condition.  Continues rosuvastatin 10 mg daily      Chronic insomnia    Chronic and affecting quality of life Advised not to use alprazolam as a sleeping aid Recommend Ambien 5 mg at bedtime as needed      Relevant Medications   zolpidem (AMBIEN) 5 MG tablet   Other Visit Diagnoses     Need for influenza vaccination       Relevant Orders   Flu Vaccine Trivalent High Dose (Fluad) (Completed)      Patient Instructions  Health Maintenance After Age 24 After age 56, you are at a higher risk for certain long-term diseases and infections as well as injuries from falls. Falls are a major cause of broken bones and head injuries in people who are older than age 69. Getting regular preventive care can help to keep you healthy and well. Preventive care includes getting regular testing and making lifestyle changes as recommended by your health care provider. Talk with your health care provider about: Which screenings and tests you should have. A screening is a test that checks for a disease when you have no symptoms. A diet and exercise plan that is right for you. What should I know about screenings and tests to prevent falls? Screening and testing are the best ways to find a health problem early. Early diagnosis and treatment give you the best chance of managing medical conditions that are common after age 10. Certain conditions and lifestyle choices may make you more likely to have a fall. Your health care provider may recommend: Regular vision checks. Poor vision and conditions such as cataracts can make you more likely to have a fall. If you wear glasses, make sure to get your prescription updated if your vision changes. Medicine  review. Work with your health care provider to regularly review all of the medicines you are taking, including over-the-counter medicines. Ask your health care provider about any side effects that may make you more likely to have a fall. Tell your health care provider if any medicines that you take make you feel dizzy or sleepy. Strength and balance checks. Your health care provider may recommend certain tests to check your strength and balance while standing, walking, or changing positions. Foot health exam. Foot pain and numbness, as well as not wearing proper footwear, can make you more likely to have a fall. Screenings, including: Osteoporosis screening. Osteoporosis is a condition that causes the bones to get weaker and break more easily. Blood pressure screening. Blood pressure changes and medicines to control blood pressure can make you feel dizzy. Depression screening. You may be more likely to have a fall if you have a fear of falling, feel depressed, or feel unable to do activities that you used to do. Alcohol use screening. Using too much alcohol can affect your balance and may make you more likely to have a fall. Follow these instructions at home: Lifestyle Do not drink alcohol if: Your health care provider tells you not to drink. If you drink alcohol: Limit how much you have to: 0-1 drink a day for women. 0-2 drinks a day for men. Know how much  alcohol is in your drink. In the [place] one drink equals one 12 oz bottle of beer (355 mL), one 5 oz glass of wine (148 mL), or one 1 oz glass of hard liquor (44 mL). Do not use any products that contain nicotine or tobacco. These products include cigarettes, chewing tobacco, and vaping devices, such as e-cigarettes. If you need help quitting, ask your health care provider. Activity  Follow a regular exercise program to stay fit. This will help you maintain your balance. Ask your health care provider what types of exercise are appropriate for  you. If you need a cane or walker, use it as recommended by your health care provider. Wear supportive shoes that have nonskid soles. Safety  Remove any tripping hazards, such as rugs, cords, and clutter. Install safety equipment such as grab bars in bathrooms and safety rails on stairs. Keep rooms and walkways well-lit. General instructions Talk with your health care provider about your risks for falling. Tell your health care provider if: You fall. Be sure to tell your health care provider about all falls, even ones that seem minor. You feel dizzy, tiredness (fatigue), or off-balance. Take over-the-counter and prescription medicines only as told by your health care provider. These include supplements. Eat a healthy diet and maintain a healthy weight. A healthy diet includes low-fat dairy products, low-fat (lean) meats, and fiber from whole grains, beans, and lots of fruits and vegetables. Stay current with your vaccines. Schedule regular health, dental, and eye exams. Summary Having a healthy lifestyle and getting preventive care can help to protect your health and wellness after age 46. Screening and testing are the best way to find a health problem early and help you avoid having a fall. Early diagnosis and treatment give you the best chance for managing medical conditions that are more common for people who are older than age 11. Falls are a major cause of broken bones and head injuries in people who are older than age 46. Take precautions to prevent a fall at home. Work with your health care provider to learn what changes you can make to improve your health and wellness and to prevent falls. This information is not intended to replace advice given to you by your health care provider. Make sure you discuss any questions you have with your health care provider. Document Revised: 10/16/2020 Document Reviewed: 10/16/2020 Elsevier Patient Education  2024 Elsevier Inc.     Edwina Barth, MD [facility]

## 2023-05-01 NOTE — Assessment & Plan Note (Signed)
Diet and patient discussed Cardiovascular risks associated with obesity discussed Advised to decrease amount of daily carbohydrate intake and daily calories and increase amount of plant-based protein diet Benefits of exercise discussed

## 2023-05-01 NOTE — Assessment & Plan Note (Signed)
Chronic and affecting quality of life Advised not to use alprazolam as a sleeping aid Recommend Ambien 5 mg at bedtime as needed
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
## 2023-05-06 DIAGNOSIS — R338 Other retention of urine: Secondary | ICD-10-CM | POA: Diagnosis not present

## 2023-05-07 ENCOUNTER — Inpatient Hospital Stay
Admission: RE | Admit: 2023-05-07 | Discharge: 2023-05-07 | Disposition: A | Discharge status: Home / Self Care | Payer: Medicare Other | Source: Ambulatory Visit | Attending: Interventional Radiology

## 2023-05-07 DIAGNOSIS — R339 Retention of urine, unspecified: Secondary | ICD-10-CM | POA: Diagnosis not present

## 2023-05-07 DIAGNOSIS — Z9889 Other specified postprocedural states: Secondary | ICD-10-CM | POA: Diagnosis not present

## 2023-05-07 DIAGNOSIS — N401 Enlarged prostate with lower urinary tract symptoms: Secondary | ICD-10-CM

## 2023-05-07 DIAGNOSIS — R319 Hematuria, unspecified: Secondary | ICD-10-CM | POA: Diagnosis not present

## 2023-05-07 HISTORY — PX: IR RADIOLOGIST EVAL & MGMT: IMG5224

## 2023-05-20 DIAGNOSIS — R8271 Bacteriuria: Secondary | ICD-10-CM | POA: Diagnosis not present

## 2023-06-27 ENCOUNTER — Ambulatory Visit: Payer: Self-pay | Admitting: Emergency Medicine

## 2023-06-27 NOTE — Telephone Encounter (Signed)
  Chief Complaint: Low back pain Symptoms: Low back pain when standing Frequency: Ongoing x 2 months Pertinent Negatives: Patient denies fever, numbness, weakness, urinary symptoms Disposition: [] ED /[] Urgent Care (no appt availability in office) / [x] Appointment(In office/virtual)/ []  [facility]/ [] Home Care/ [] Refused Recommended Disposition /[] [facility]/ []  Follow-up with PCP Additional Notes: Pt reports he has been experiencing LBP, primarily when standing on his feet for extended periods of time, he reports the pain at 5/10. He notes he has not been taking anything OTC as he has just been "dealing with it". Pt denies fever, urinary symptoms, numbness, weakness. OV scheduled for 01/21. This RN educated pt on new-worsening symptoms, when to call back/seek emergent care. Pt verbalized understanding and agrees to plan.   Copied from CRM [id number]. Topic: Clinical - Red Word Triage >> Jun 27, 2023 10:25 AM Marica Otter wrote: [facility] that prompted transfer to Nurse Triage: Lower back pain, now a 5 can't stand up for a long period of time. If sitting fine. Reason for Disposition  [1] MODERATE back pain (e.g., interferes with normal activities) AND [2] present > 3 days  Answer Assessment - Initial Assessment Questions 1. ONSET: "When did the pain begin?"      About 2 months or more 2. LOCATION: "Where does it hurt?" (upper, mid or lower back)     Low back 3. SEVERITY: "How bad is the pain?"  (e.g., Scale 1-10; mild, moderate, or severe)   - MILD (1-3): Doesn't interfere with normal activities.    - MODERATE (4-7): Interferes with normal activities or awakens from sleep.    - SEVERE (8-10): Excruciating pain, unable to do any normal activities.      5/10 4. PATTERN: "Is the pain constant?" (e.g., yes, no; constant, intermittent)      Intermittent 5. RADIATION: "Does the pain shoot into your legs or somewhere else?"     None 6. CAUSE:  "What do you think is causing the  back pain?"      Unknown  7. BACK OVERUSE:  "Any recent lifting of heavy objects, strenuous work or exercise?"     None 8. MEDICINES: "What have you taken so far for the pain?" (e.g., nothing, acetaminophen, NSAIDS)     Nothing, just dealing with it 9. NEUROLOGIC SYMPTOMS: "Do you have any weakness, numbness, or problems with bowel/bladder control?"     No 10. OTHER SYMPTOMS: "Do you have any other symptoms?" (e.g., fever, abdomen pain, burning with urination, blood in urine)       None  Protocols used: Back Pain-A-AH

## 2023-07-01 ENCOUNTER — Ambulatory Visit (INDEPENDENT_AMBULATORY_CARE_PROVIDER_SITE_OTHER): Payer: Medicare Other | Admitting: Emergency Medicine

## 2023-07-01 ENCOUNTER — Encounter: Payer: Self-pay | Admitting: Emergency Medicine

## 2023-07-01 VITALS — BP 118/70 | HR 67 | Temp 97.9°F | Ht 74.0 in | Wt 317.0 lb

## 2023-07-01 DIAGNOSIS — C61 Malignant neoplasm of prostate: Secondary | ICD-10-CM | POA: Diagnosis not present

## 2023-07-01 DIAGNOSIS — M4726 Other spondylosis with radiculopathy, lumbar region: Secondary | ICD-10-CM | POA: Diagnosis not present

## 2023-07-01 DIAGNOSIS — G8929 Other chronic pain: Secondary | ICD-10-CM | POA: Diagnosis not present

## 2023-07-01 DIAGNOSIS — E785 Hyperlipidemia, unspecified: Secondary | ICD-10-CM | POA: Diagnosis not present

## 2023-07-01 DIAGNOSIS — I10 Essential (primary) hypertension: Secondary | ICD-10-CM | POA: Diagnosis not present

## 2023-07-01 DIAGNOSIS — M5442 Lumbago with sciatica, left side: Secondary | ICD-10-CM | POA: Diagnosis not present

## 2023-07-01 MED ORDER — TRAMADOL HCL 50 MG PO TABS: 50.0000 mg | ORAL_TABLET | Freq: Three times a day (TID) | ORAL | 0 refills | Status: AC | PRN

## 2023-07-01 NOTE — Assessment & Plan Note (Signed)
Chronic stable condition Continues rosuvastatin 10 mg daily

## 2023-07-01 NOTE — Assessment & Plan Note (Signed)
No evidence of metastatic bone disease. Sees oncologist on a regular basis. Treatment going well.  No complications so far.  Tolerating it well.

## 2023-07-01 NOTE — Patient Instructions (Signed)
Acute Back Pain, Adult Acute back pain is sudden and usually short-lived. It is often caused by an injury to the muscles and tissues in the back. The injury may result from: A muscle, tendon, or ligament getting overstretched or torn. Ligaments are tissues that connect bones to each other. Lifting something improperly can cause a back strain. Wear and tear (degeneration) of the spinal disks. Spinal disks are circular tissue that provide cushioning between the bones of the spine (vertebrae). Twisting motions, such as while playing sports or doing yard work. A hit to the back. Arthritis. You may have a physical exam, lab tests, and imaging tests to find the cause of your pain. Acute back pain usually goes away with rest and home care. Follow these instructions at home: Managing pain, stiffness, and swelling Take over-the-counter and prescription medicines only as told by your health care provider. Treatment may include medicines for pain and inflammation that are taken by mouth or applied to the skin, or muscle relaxants. Your health care provider may recommend applying ice during the first 24-48 hours after your pain starts. To do this: Put ice in a plastic bag. Place a towel between your skin and the bag. Leave the ice on for 20 minutes, 2-3 times a day. Remove the ice if your skin turns bright red. This is very important. If you cannot feel pain, heat, or cold, you have a greater risk of damage to the area. If directed, apply heat to the affected area as often as told by your health care provider. Use the heat source that your health care provider recommends, such as a moist heat pack or a heating pad. Place a towel between your skin and the heat source. Leave the heat on for 20-30 minutes. Remove the heat if your skin turns bright red. This is especially important if you are unable to feel pain, heat, or cold. You have a greater risk of getting burned. Activity  Do not stay in bed. Staying in  bed for more than 1-2 days can delay your recovery. Sit up and stand up straight. Avoid leaning forward when you sit or hunching over when you stand. If you work at a desk, sit close to it so you do not need to lean over. Keep your chin tucked in. Keep your neck drawn back, and keep your elbows bent at a 90-degree angle (right angle). Sit high and close to the steering wheel when you drive. Add lower back (lumbar) support to your car seat, if needed. Take short walks on even surfaces as soon as you are able. Try to increase the length of time you walk each day. Do not sit, drive, or stand in one place for more than 30 minutes at a time. Sitting or standing for long periods of time can put stress on your back. Do not drive or use heavy machinery while taking prescription pain medicine. Use proper lifting techniques. When you bend and lift, use positions that put less stress on your back: Bend your knees. Keep the load close to your body. Avoid twisting. Exercise regularly as told by your health care provider. Exercising helps your back heal faster and helps prevent back injuries by keeping muscles strong and flexible. Work with a physical therapist to make a safe exercise program, as recommended by your health care provider. Do any exercises as told by your physical therapist. Lifestyle Maintain a healthy weight. Extra weight puts stress on your back and makes it difficult to have good   posture. Avoid activities or situations that make you feel anxious or stressed. Stress and anxiety increase muscle tension and can make back pain worse. Learn ways to manage anxiety and stress, such as through exercise. General instructions Sleep on a firm mattress in a comfortable position. Try lying on your side with your knees slightly bent. If you lie on your back, put a pillow under your knees. Keep your head and neck in a straight line with your spine (neutral position) when using electronic equipment like  smartphones or pads. To do this: Raise your smartphone or pad to look at it instead of bending your head or neck to look down. Put the smartphone or pad at the level of your face while looking at the screen. Follow your treatment plan as told by your health care provider. This may include: Cognitive or behavioral therapy. Acupuncture or massage therapy. Meditation or yoga. Contact a health care provider if: You have pain that is not relieved with rest or medicine. You have increasing pain going down into your legs or buttocks. Your pain does not improve after 2 weeks. You have pain at night. You lose weight without trying. You have a fever or chills. You develop nausea or vomiting. You develop abdominal pain. Get help right away if: You develop new bowel or bladder control problems. You have unusual weakness or numbness in your arms or legs. You feel faint. These symptoms may represent a serious problem that is an emergency. Do not wait to see if the symptoms will go away. Get medical help right away. Call your local emergency services (911 in the [place] Do not drive yourself to the hospital. Summary Acute back pain is sudden and usually short-lived. Use proper lifting techniques. When you bend and lift, use positions that put less stress on your back. Take over-the-counter and prescription medicines only as told by your health care provider, and apply heat or ice as told. This information is not intended to replace advice given to you by your health care provider. Make sure you discuss any questions you have with your health care provider. Document Revised: 08/18/2020 Document Reviewed: 08/18/2020 Elsevier Patient Education  2024 [employer]vier Inc[employer]  

## 2023-07-01 NOTE — Assessment & Plan Note (Signed)
Well-controlled hypertension Continues Zestoretic 20-12.5 mg once a day Cardiovascular risks associated with hypertension discussed

## 2023-07-01 NOTE — Assessment & Plan Note (Signed)
Active and affecting quality of life Pain management discussed Advised to take Tylenol for mild to moderate pain and tramadol for moderate to severe pain May benefit from physical therapy.  Referral placed today Recommend orthopedic evaluation.  Referral placed today

## 2023-07-01 NOTE — Progress Notes (Signed)
[name] [name] 73 y.o.   Chief Complaint  Patient presents with   Back Pain    Patient states has been going on for 6 months. Mainly his lower back when he is standing still as long as he is moving it is okay     HISTORY OF PRESENT ILLNESS: This is a 73 y.o. male complaining of lumbar pain for the past 6 months. On and off pain.  Symptoms worse with prolonged standing.  Movement helps. Has history of sciatica. Recent CT scan of abdomen and pelvis from 01/17/2023: Musculoskeletal: Degenerative changes are seen within the lumbar spine.  No lytic or blastic bone lesion.  Patient has history of prostate cancer No other associated symptoms. No other complaints or medical concerns today.  Back Pain Pertinent negatives include no abdominal pain, chest pain, dysuria, fever or headaches.     Prior to Admission medications   Medication Sig Start Date End Date Taking? Authorizing Provider  ALPRAZolam Prudy Feeler) 0.5 MG tablet TAKE 1 TABLET BY MOUTH EVERY DAY AS NEEDED FOR ANXIETY 03/31/23  Yes Antwane Grose, Eilleen Kempf, MD  lisinopril-hydrochlorothiazide (ZESTORETIC) 20-12.5 MG tablet TAKE 1 TABLET BY MOUTH EVERY DAY Patient taking differently: Take 1 tablet by mouth daily. 11/16/22  Yes Braylea Brancato, Eilleen Kempf, MD  meloxicam (MOBIC) 15 MG tablet Take 15 mg by mouth as needed for pain. 04/07/23  Yes [provider]  rosuvastatin (CRESTOR) 10 MG tablet TAKE 1 TABLET BY MOUTH EVERY DAY 02/14/23  Yes Favour Aleshire, Eilleen Kempf, MD  tamsulosin (FLOMAX) 0.4 MG CAPS capsule Take 0.8 mg by mouth daily. 01/03/23  Yes [provider]  zolpidem (AMBIEN) 5 MG tablet Take 1 tablet (5 mg total) by mouth at bedtime as needed for sleep. 05/01/23  Yes Ysela Hettinger, Eilleen Kempf, MD  cephALEXin (KEFLEX) 500 MG capsule Take 1 capsule (500 mg total) by mouth 3 (three) times daily. Patient not taking: Reported on 07/01/2023 02/13/23   Henderly, Britni A, PA-C  methylPREDNISolone (MEDROL DOSEPAK) 4 MG TBPK tablet Dispense as  directed. 6 day taper dose Patient not taking: Reported on 07/01/2023 04/07/23   Mickie Kay, NP  oxybutynin (DITROPAN) 5 MG tablet Take 5 mg by mouth daily. Patient not taking: Reported on 03/06/2023 01/16/23   [provider]    Allergies  Allergen Reactions   Aspirin     REACTION: upsets stomach   Statins     REACTION: UPSETS STOMACH   Gadolinium Derivatives Nausea And Vomiting    Pt was given 20 ml multihance and became nauseated and vomited several times.     Patient Active Problem List   Diagnosis Date Noted   Chronic insomnia 05/01/2023   S/P total knee arthroplasty, right 03/15/2022   Body mass index (BMI) of 38.0-38.9 in adult 01/10/2020   Diverticulosis 01/10/2020   Prediabetes 01/10/2020   Dyslipidemia 01/10/2020   Malignant neoplasm of prostate (HCC) 06/08/2019   Prostate enlargement 08/29/2017   Gout 10/25/2014   Essential hypertension    Benign prostatic hyperplasia    Kidney stones    Prostate nodule     Past Medical History:  Diagnosis Date   Allergy    seasonal allergies   Arthritis    generalized   Blood transfusion without reported diagnosis 2013   when had diverticulitis flare   BPH (benign prostatic hypertrophy)    Colon polyp    diverticular bleed with transfusion   Diverticulosis    History of kidney stones    Hyperlipidemia    on meds  Hypertension    on meds   Kidney stones    hx of 15-20 kidney stones    Obesity, unspecified    Pre-diabetes    Prostate cancer (HCC) 2020   dx 05/2019   Prostate nodule 07/27/2008    Past Surgical History:  Procedure Laterality Date   ANKLE FUSION Left 1991   COLONOSCOPY  2011   DB-F/V-miralax(good)-TICS/3 HPP   CYSTOSCOPY W/ RETROGRADES Bilateral 08/02/2022   Procedure: CYSTOSCOPY WITH BILATERAL RETROGRADE PYELOGRAM;  Surgeon: Crista Elliot, MD;  Location: [facility];  Service: Urology;  Laterality: Bilateral;   HERNIA REPAIR N/A    umbilical    IR 3D INDEPENDENT WKST  04/07/2023    IR ANGIOGRAM PELVIS SELECTIVE OR SUPRASELECTIVE  04/07/2023   IR ANGIOGRAM SELECTIVE EACH ADDITIONAL VESSEL  04/07/2023   IR ANGIOGRAM SELECTIVE EACH ADDITIONAL VESSEL  04/07/2023   IR EMBO ARTERIAL NOT HEMORR HEMANG INC GUIDE ROADMAPPING  04/07/2023   IR RADIOLOGIST EVAL & MGMT  03/19/2023   IR RADIOLOGIST EVAL & MGMT  05/07/2023   IR US GUIDE VASC ACCESS RIGHT  04/07/2023   POLYPECTOMY  2011   3 HPP   TOTAL KNEE ARTHROPLASTY Right 03/15/2022   Procedure: TOTAL KNEE ARTHROPLASTY;  Surgeon: Frederico Hamman, MD;  Location: [facility];  Service: Orthopedics;  Laterality: Right;   TRANSURETHRAL RESECTION OF BLADDER TUMOR N/A 08/02/2022   Procedure: TRANSURETHRAL RESECTION OF BLADDER TUMOR (TURBT);  Surgeon: Crista Elliot, MD;  Location: [facility];  Service: Urology;  Laterality: N/A;  60 MINS   TRANSURETHRAL RESECTION OF PROSTATE  2020    Social History   Socioeconomic History   Marital status: Married    Spouse name: [name]   Number of children: 2   Years of education: Not on file   Highest education level: Not on file  Occupational History   Occupation: Retired  Tobacco Use   Smoking status: Never   Smokeless tobacco: Never  Vaping Use   Vaping status: Never Used  Substance and Sexual Activity   Alcohol use: No   Drug use: No   Sexual activity: Yes  Other Topics Concern   Not on file  Social History Narrative   Lives with wife.   Social Drivers of Health   Financial Resource Strain: Medium Risk (03/06/2023)   Overall Financial Resource Strain (CARDIA)    Difficulty of Paying Living Expenses: Somewhat hard  Food Insecurity: No Food Insecurity (03/06/2023)   Hunger Vital Sign    Worried About Running Out of Food in the Last Year: Never true    Ran Out of Food in the Last Year: Never true  Transportation Needs: No Transportation Needs (03/06/2023)   PRAPARE - Administrator, Civil Service (Medical): No    Lack of Transportation (Non-Medical): No  Physical  Activity: Inactive (03/06/2023)   Exercise Vital Sign    Days of Exercise per Week: 0 days    Minutes of Exercise per Session: 0 min  Stress: No Stress Concern Present (03/06/2023)   [employer] of Occupational Health - Occupational Stress Questionnaire    Feeling of Stress : Not at all  Social Connections: Moderately Integrated (03/06/2023)   Social Connection and Isolation Panel [NHANES]    Frequency of Communication with Friends and Family: More than three times a week    Frequency of Social Gatherings with Friends and Family: Once a week    Attends Religious Services: More than 4 times per year    Active  Member of Clubs or Organizations: No    Attends Banker Meetings: Never    Marital Status: Married  Catering manager Violence: Not At Risk (03/06/2023)   Humiliation, Afraid, Rape, and Kick questionnaire    Fear of Current or Ex-Partner: No    Emotionally Abused: No    Physically Abused: No    Sexually Abused: No    Family History  Problem Relation Age of Onset   Diabetes Mother    Breast cancer Mother 24   Prostate cancer Father        patient did not have a relationship with his father but understands he had prostate ca   Colon polyps Father 76   Colon cancer Father 85   Diabetes Sister    Pancreatic cancer Neg Hx    Esophageal cancer Neg Hx    Stomach cancer Neg Hx    Rectal cancer Neg Hx      Review of Systems  Constitutional: Negative.  Negative for chills and fever.  HENT:  Negative for sore throat.   Respiratory: Negative.  Negative for cough and shortness of breath.   Cardiovascular: Negative.  Negative for chest pain and palpitations.  Gastrointestinal:  Negative for abdominal pain, nausea and vomiting.  Genitourinary: Negative.  Negative for dysuria and hematuria.  Musculoskeletal:  Positive for back pain.  Skin: Negative.  Negative for rash.  Neurological:  Negative for dizziness and headaches.  All other systems reviewed and are  negative.   Vitals:   07/01/23 0803  BP: 118/70  Pulse: 67  Temp: 97.9 F (36.6 C)  SpO2: 97%    Physical Exam Vitals reviewed.  Constitutional:      Appearance: Normal appearance.  HENT:     Head: Normocephalic.  Eyes:     Extraocular Movements: Extraocular movements intact.  Cardiovascular:     Rate and Rhythm: Normal rate.  Pulmonary:     Effort: Pulmonary effort is normal.  Abdominal:     Palpations: Abdomen is soft.     Tenderness: There is no abdominal tenderness.  Musculoskeletal:     Comments: Some tenderness to paraspinous muscles of lumbar spine area along with limited range of motion due to pain.  Skin:    General: Skin is warm and dry.     Capillary Refill: Capillary refill takes less than 2 seconds.  Neurological:     General: No focal deficit present.     Mental Status: He is alert and oriented to person, place, and time.     Sensory: No sensory deficit.     Motor: No weakness.  Psychiatric:        Mood and Affect: Mood normal.        Behavior: Behavior normal.      ASSESSMENT & PLAN: A total of 42 minutes was spent with the patient and counseling/coordination of care regarding preparing for this visit, review of most recent office visit notes, review of multiple chronic medical conditions and their management, management of chronic back pain and need for orthopedic evaluation and physical therapy, review of all medications, review of most recent bloodwork results, review of health maintenance items, education on nutrition, prognosis, documentation, and need for follow up.   Problem List Items Addressed This Visit       Cardiovascular and Mediastinum   Essential hypertension   Well-controlled hypertension Continues Zestoretic 20-12.5 mg once a day Cardiovascular risks associated with hypertension discussed        Nervous and Auditory  Osteoarthritis of spine with radiculopathy, lumbar region - Primary   Active and affecting quality of  life Pain management discussed Advised to take Tylenol for mild to moderate pain and tramadol for moderate to severe pain May benefit from physical therapy.  Referral placed today Recommend orthopedic evaluation.  Referral placed today      Relevant Medications   meloxicam (MOBIC) 15 MG tablet   traMADol (ULTRAM) 50 MG tablet   Other Relevant Orders   Ambulatory referral to Orthopedic Surgery   Ambulatory referral to Physical Therapy   Chronic bilateral low back pain with left-sided sciatica   Pain management discussed. Recommend Tylenol for mild to moderate pain and tramadol for moderate to severe pain. Recommend orthopedic evaluation.  May need lumbar spine MRI. Referral placed today. May benefit from physical therapy.  Referral placed today.      Relevant Medications   meloxicam (MOBIC) 15 MG tablet   traMADol (ULTRAM) 50 MG tablet   Other Relevant Orders   Ambulatory referral to Orthopedic Surgery   Ambulatory referral to Physical Therapy     Genitourinary   Malignant neoplasm of prostate (HCC)   No evidence of metastatic bone disease. Sees oncologist on a regular basis. Treatment going well.  No complications so far.  Tolerating it well.        Other   Dyslipidemia   Chronic stable condition.  Continues rosuvastatin 10 mg daily      Patient Instructions  Acute Back Pain, Adult Acute back pain is sudden and usually short-lived. It is often caused by an injury to the muscles and tissues in the back. The injury may result from: A muscle, tendon, or ligament getting overstretched or torn. Ligaments are tissues that connect bones to each other. Lifting something improperly can cause a back strain. Wear and tear (degeneration) of the spinal disks. Spinal disks are circular tissue that provide cushioning between the bones of the spine (vertebrae). Twisting motions, such as while playing sports or doing yard work. A hit to the back. Arthritis. You may have a physical  exam, lab tests, and imaging tests to find the cause of your pain. Acute back pain usually goes away with rest and home care. Follow these instructions at home: Managing pain, stiffness, and swelling Take over-the-counter and prescription medicines only as told by your health care provider. Treatment may include medicines for pain and inflammation that are taken by mouth or applied to the skin, or muscle relaxants. Your health care provider may recommend applying ice during the first 24-48 hours after your pain starts. To do this: Put ice in a plastic bag. Place a towel between your skin and the bag. Leave the ice on for 20 minutes, 2-3 times a day. Remove the ice if your skin turns bright red. This is very important. If you cannot feel pain, heat, or cold, you have a greater risk of damage to the area. If directed, apply heat to the affected area as often as told by your health care provider. Use the heat source that your health care provider recommends, such as a moist heat pack or a heating pad. Place a towel between your skin and the heat source. Leave the heat on for 20-30 minutes. Remove the heat if your skin turns bright red. This is especially important if you are unable to feel pain, heat, or cold. You have a greater risk of getting burned. Activity  Do not stay in bed. Staying in bed for more than 1-2 days  can delay your recovery. Sit up and stand up straight. Avoid leaning forward when you sit or hunching over when you stand. If you work at a desk, sit close to it so you do not need to lean over. Keep your chin tucked in. Keep your neck drawn back, and keep your elbows bent at a 90-degree angle (right angle). Sit high and close to the steering wheel when you drive. Add lower back (lumbar) support to your car seat, if needed. Take short walks on even surfaces as soon as you are able. Try to increase the length of time you walk each day. Do not sit, drive, or stand in one place for more  than 30 minutes at a time. Sitting or standing for long periods of time can put stress on your back. Do not drive or use heavy machinery while taking prescription pain medicine. Use proper lifting techniques. When you bend and lift, use positions that put less stress on your back: [place] your knees. Keep the load close to your body. Avoid twisting. Exercise regularly as told by your health care provider. Exercising helps your back heal faster and helps prevent back injuries by keeping muscles strong and flexible. Work with a physical therapist to make a safe exercise program, as recommended by your health care provider. Do any exercises as told by your physical therapist. Lifestyle Maintain a healthy weight. Extra weight puts stress on your back and makes it difficult to have good posture. Avoid activities or situations that make you feel anxious or stressed. Stress and anxiety increase muscle tension and can make back pain worse. Learn ways to manage anxiety and stress, such as through exercise. General instructions Sleep on a firm mattress in a comfortable position. Try lying on your side with your knees slightly bent. If you lie on your back, put a pillow under your knees. Keep your head and neck in a straight line with your spine (neutral position) when using electronic equipment like smartphones or pads. To do this: Raise your smartphone or pad to look at it instead of bending your head or neck to look down. Put the smartphone or pad at the level of your face while looking at the screen. Follow your treatment plan as told by your health care provider. This may include: Cognitive or behavioral therapy. Acupuncture or massage therapy. Meditation or yoga. Contact a health care provider if: You have pain that is not relieved with rest or medicine. You have increasing pain going down into your legs or buttocks. Your pain does not improve after 2 weeks. You have pain at night. You lose weight  without trying. You have a fever or chills. You develop nausea or vomiting. You develop abdominal pain. Get help right away if: You develop new bowel or bladder control problems. You have unusual weakness or numbness in your arms or legs. You feel faint. These symptoms may represent a serious problem that is an emergency. Do not wait to see if the symptoms will go away. Get medical help right away. Call your local emergency services (911 in the [place] Do not drive yourself to the hospital. Summary Acute back pain is sudden and usually short-lived. Use proper lifting techniques. When you bend and lift, use positions that put less stress on your back. Take over-the-counter and prescription medicines only as told by your health care provider, and apply heat or ice as told. This information is not intended to replace advice given to you by your health care provider.  Make sure you discuss any questions you have with your health care provider. Document Revised: 08/18/2020 Document Reviewed: 08/18/2020 Elsevier Patient Education  2024 [employer]   Edwina Barth, MD [facility]

## 2023-07-01 NOTE — Assessment & Plan Note (Signed)
Pain management discussed. Recommend Tylenol for mild to moderate pain and tramadol for moderate to severe pain. Recommend orthopedic evaluation.  May need lumbar spine MRI. Referral placed today. May benefit from physical therapy.  Referral placed today.

## 2023-07-14 NOTE — Therapy (Signed)
 OUTPATIENT PHYSICAL THERAPY THORACOLUMBAR EVALUATION   Patient Name: [name] [name] MRN: [id number] DOB:30-May-1951, 73 y.o., male Today's Date: 07/15/2023  END OF SESSION:  PT End of Session - 07/15/23 0830     Visit Number 1    Number of Visits 13    Date for PT Re-Evaluation 08/26/23    Authorization Type [employer] medicare    PT Start Time 0830    PT Stop Time 0915    PT Time Calculation (min) 45 min    Activity Tolerance Patient tolerated treatment well    Behavior During Therapy [facility] for tasks assessed/performed             Past Medical History:  Diagnosis Date   Allergy    seasonal allergies   Arthritis    generalized   Blood transfusion without reported diagnosis 2013   when had diverticulitis flare   BPH (benign prostatic hypertrophy)    Colon polyp    diverticular bleed with transfusion   Diverticulosis    History of kidney stones    Hyperlipidemia    on meds   Hypertension    on meds   Kidney stones    hx of 15-20 kidney stones    Obesity, unspecified    Pre-diabetes    Prostate cancer (HCC) 2020   dx 05/2019   Prostate nodule 07/27/2008   Past Surgical History:  Procedure Laterality Date   ANKLE FUSION Left 1991   COLONOSCOPY  2011   DB-F/V-miralax (good)-TICS/3 HPP   CYSTOSCOPY W/ RETROGRADES Bilateral 08/02/2022   Procedure: CYSTOSCOPY WITH BILATERAL RETROGRADE PYELOGRAM;  Surgeon: Carolee Sherwood JONETTA DOUGLAS, MD;  Location: [facility];  Service: Urology;  Laterality: Bilateral;   HERNIA REPAIR N/A    umbilical    IR 3D INDEPENDENT WKST  04/07/2023   IR ANGIOGRAM PELVIS SELECTIVE OR SUPRASELECTIVE  04/07/2023   IR ANGIOGRAM SELECTIVE EACH ADDITIONAL VESSEL  04/07/2023   IR ANGIOGRAM SELECTIVE EACH ADDITIONAL VESSEL  04/07/2023   IR EMBO ARTERIAL NOT HEMORR HEMANG INC GUIDE ROADMAPPING  04/07/2023   IR RADIOLOGIST EVAL & MGMT  03/19/2023   IR RADIOLOGIST EVAL & MGMT  05/07/2023   IR US  GUIDE VASC ACCESS RIGHT  04/07/2023   POLYPECTOMY  2011   3 HPP   TOTAL  KNEE ARTHROPLASTY Right 03/15/2022   Procedure: TOTAL KNEE ARTHROPLASTY;  Surgeon: Shari Sieving, MD;  Location: [facility];  Service: Orthopedics;  Laterality: Right;   TRANSURETHRAL RESECTION OF BLADDER TUMOR N/A 08/02/2022   Procedure: TRANSURETHRAL RESECTION OF BLADDER TUMOR (TURBT);  Surgeon: Carolee Sherwood JONETTA DOUGLAS, MD;  Location: [facility];  Service: Urology;  Laterality: N/A;  60 MINS   TRANSURETHRAL RESECTION OF PROSTATE  2020   Patient Active Problem List   Diagnosis Date Noted   Osteoarthritis of spine with radiculopathy, lumbar region 07/01/2023   Chronic bilateral low back pain with left-sided sciatica 07/01/2023   Chronic insomnia 05/01/2023   S/P total knee arthroplasty, right 03/15/2022   Body mass index (BMI) of 38.0-38.9 in adult 01/10/2020   Diverticulosis 01/10/2020   Prediabetes 01/10/2020   Dyslipidemia 01/10/2020   Malignant neoplasm of prostate (HCC) 06/08/2019   Prostate enlargement 08/29/2017   Gout 10/25/2014   Essential hypertension    Benign prostatic hyperplasia    Kidney stones    Prostate nodule     PCP: Purcell Emil Schanz, MD  REFERRING PROVIDER: Purcell Emil Schanz, MD  REFERRING DIAG:  [phone] (ICD-10-CM) - Osteoarthritis of spine with radiculopathy, lumbar region  M54.42,G89.29 (ICD-10-CM) -  Chronic bilateral low back pain with left-sided sciatica    Rationale for Evaluation and Treatment: Rehabilitation  THERAPY DIAG:  Other low back pain  Muscle weakness (generalized)  Other abnormalities of gait and mobility  ONSET DATE: 7 months  SUBJECTIVE:                                                                                                                                                                                           SUBJECTIVE STATEMENT: Eval statement 07/15/2023: Can't stand for long, maybe 5 minutes, just has been dealing with it for the past 7 months. Almost feels like a spasm on both L and R side, just oges across the back. No  cepahlic travel. No caudal travel. No numbness or tingling. Just sharp spasms towards the lumbosacral area. Rest reliefs pain to 0/10, standing 9/10, hurts to stand to shower.   PERTINENT HISTORY:  Prostate surgery, Ankle fusion  PAIN:  Are you having pain? Yes: NPRS scale: 0/10 Pain location: lumbosacral Pain description: sharp spasms Aggravating factors: standing  Relieving factors: sitting   PRECAUTIONS: None  RED FLAGS: Bowel or bladder incontinence: Yes: following prostate surgery in feb, 2024 and Cauda equina syndrome: No   WEIGHT BEARING RESTRICTIONS: No  FALLS:  Has patient fallen in last 6 months? No  LIVING ENVIRONMENT: Lives with: lives with their family Lives in: House/apartment Stairs: Yes: Internal: 21 steps; on right going up Has following equipment at home: Single point cane  OCCUPATION: retired   PLOF: Independent  PATIENT GOALS: reduce pain and prolong how long I can stand  NEXT MD VISIT: still needs to schedule  OBJECTIVE:  Note: Objective measures were completed at Evaluation unless otherwise noted.   PATIENT SURVEYS:  Modified Oswestry 21/50   COGNITION: Overall cognitive status: Within functional limits for tasks assessed     SENSATION: WFL  MUSCLE LENGTH: Hamstrings: Right -20 deg; Left -20 deg   POSTURE: rounded shoulders, forward head, flexed trunk , and weight shift right  PALPATION: No tenderness from palpation   Lumbar contraction pattern  L Multifidus:impaired  R Multifidus:impaired   LUMBAR ROM:   AROM eval  Flexion 70%  Extension 70%  Right lateral flexion 40%  Left lateral flexion 40%  Right rotation 40%  Left rotation 40%   (Blank rows = not tested)  ! Indicates pain with testing  LOWER EXTREMITY ROM:     Passive  Right eval Left eval  Hip flexion 100 100  Hip extension [facility]  Hip abduction    Hip adduction    Hip internal rotation    Hip external rotation  Knee flexion    Knee extension     Ankle dorsiflexion    Ankle plantarflexion    Ankle inversion    Ankle eversion     (Blank rows = not tested)  ! Indicates pain with testing  LOWER EXTREMITY MMT:    MMT Right eval Left eval  Hip flexion 4- 4-  Hip extension 4- 4-  Hip abduction 4- 4-  Hip adduction    Hip internal rotation    Hip external rotation    Knee flexion    Knee extension    Ankle dorsiflexion    Ankle plantarflexion    Ankle inversion    Ankle eversion     (Blank rows = not tested)   ! Indicates pain with testing LUMBAR SPECIAL TESTS:  Prone instability test: Positive  FUNCTIONAL TESTS:  5 times sit to stand: 21.16s  GAIT: Distance walked: 83ft Assistive device utilized: None Level of assistance: SBA Comments: antalgic, low quality gait pattern with flexed R trunk lean.   [facility] Adult PT Treatment:                                                DATE: 07/15/2023   Neuromuscular re-ed: Core activation pattern Core bracing practice  Self Care: POC discussion Patient education                                                                                                                                PATIENT EDUCATION:  Education details: Pt received education regarding HEP performance, ADL performance, functional activity tolerance, impairment education, appropriate performance of therapeutic activities, Core activation patterns, anatomy. Person educated: Patient Education method: Explanation, Demonstration, Tactile cues, Verbal cues, and Handouts Education comprehension: verbalized understanding and returned demonstration  HOME EXERCISE PROGRAM: Access Code: [email or web address] URL: [email or web address] Date: 07/15/2023 Prepared by: Mabel Kiang  Exercises - Prone Hip Extension  - 1 x daily - 5 x weekly - 2 sets - 8 reps - 8s hold - Supine Single Knee to Chest Stretch  - 2 x daily - 5-7 x weekly - 2 sets - 1 reps - 53m hold - Supine Lower Trunk Rotation  - 1 x daily - 5 x  weekly - 2-3 sets - 10 reps - 3s hold - Supine Bridge  - 1 x daily - 5 x weekly - 2-3 sets - 8 reps - 5s hold - Sit to Stand with Resistance Around Legs  - 1 x daily - 3-5 x weekly - 2 sets - 12 reps  ASSESSMENT:  CLINICAL IMPRESSION: Eval impression (07/15/2023): Pt. attended today's physical therapy session for evaluation of low back pain. Pt has complaints of intoleracne to standing and pain in the low back. Pt has notable deficits with core activation patterns, BLE strength, B hip mobility, Lumbar AROM, and  posture in stance.  Pt would benefit from therapeutic focus on postural education, Lumbar ROM, core activation during functional tasks, and standing activity tolerance.  Treatment performed today focused on foundational exercises for core activation and HEP independence. Pt demonstrated great understanding of education provided. required minimal cues and stand by assistance during ambulationfor appropriate performance with today's activities. Pt requires the intervention of skilled outpatient physical therapy to address the aforementioned deficits and progress towards a functional level in line with therapeutic goals.    OBJECTIVE IMPAIRMENTS: Abnormal gait, decreased activity tolerance, decreased mobility, difficulty walking, decreased ROM, decreased strength, impaired flexibility, impaired tone, and pain.   ACTIVITY LIMITATIONS: carrying, lifting, bending, standing, squatting, continence, and locomotion level  PARTICIPATION LIMITATIONS: interpersonal relationship, driving, shopping, community activity, and yard work  PERSONAL FACTORS: Age, Fitness, and Past/current experiences are also affecting patient's functional outcome.   REHAB POTENTIAL: Fair personal factors  CLINICAL DECISION MAKING: Stable/uncomplicated  EVALUATION COMPLEXITY: Low   GOALS: Goals reviewed with patient? YES  SHORT TERM GOALS: Target date: 08/05/2023    Pt will be independent with administered HEP to  demonstrate the competency necessary for long term managemnet of symptoms at home. Baseline: Goal status: INITIAL Pt will perform a 5 time STS in less than or equal to 16 seconds to demonstrate improved BLE strength and function for transfer tasks necessary for improving standing tolerance. Baseline: 21s Goal status: INITIAL     LONG TERM GOALS: Target date: 08/26/2023    Pt. Will achieve a MODI score of 33/50 as to demonstrate improvement in self-perceived functional ability with daily activities.  Baseline: 21 Goal status: INITIAL  2.  Pt will improve Global hip strength to a 4+/5 to demonstrate improvement in strength for quality of motion and activity performance.  Baseline: 4- Goal status: INITIAL  3.  Pt will improve Lumbar AROM to 80% of standardized norms with less than 4/10 pain to demonstrate necessary mobility for high quality and safe ADLs  Baseline: 40% Goal status: INITIAL  4.  Pt will report the ability to stand for >/= 15 minutes as to demonstrate improved tolerance to standing for prolonged time and improved ability to participate in ADLs.  Baseline:  Goal status: INITIAL  PLAN:  PT FREQUENCY: 2x/week  PT DURATION: 6 weeks  PLANNED INTERVENTIONS: 97110-Therapeutic exercises, 97530- Therapeutic activity, [id number]- Neuromuscular re-education, 97535- Self Care, [id number]- Manual therapy, [phone]- Gait training, Patient/Family education, Stair training, Dry Needling, Joint mobilization, and Spinal mobilization.  PLAN FOR NEXT SESSION: Review HEP, Begin POC as detailed in assessment   Mabel Kiang, PT, DPT 07/15/2023, 10:40 AM   Date of referral: 07/01/2023 Referring provider: Purcell April Schanz, MD Referring diagnosis?  M47.26 (ICD-10-CM) - Osteoarthritis of spine with radiculopathy, lumbar region  M54.42,G89.29 (ICD-10-CM) - Chronic bilateral low back pain with left-sided sciatica   Treatment diagnosis? (if different than referring diagnosis) see  assessment  What was this (referring dx) caused by? Ongoing Issue and Arthritis  Lysle of Condition: Chronic (continuous duration > 3 months)   Laterality: Both  Current Functional Measure Score: Back Index LEFS: 21/50  Objective measurements identify impairments when they are compared to normal values, the uninvolved extremity, and prior level of function.  [x]  Yes  []  No  Objective assessment of functional ability: Moderate functional limitations   Briefly describe symptoms: see assessment  How did symptoms start: no MOI  Average pain intensity:  Last 24 hours: 0/10  Past week: 9/10  How often does the pt experience symptoms? Frequently  How much have the symptoms interfered with usual daily activities? Moderately  How has condition changed since care began at this facility? NA - initial visit  In general, how is the patients overall health? Fair   BACK PAIN (STarT Back Screening Tool) Has pain spread down the leg(s) at some time in the last 2 weeks? Y Has there been pain in the shoulder or neck at some time in the last 2 weeks? N Has the pt only walked short distances because of back pain? y Has patient dressed more slowly because of back pain in the past 2 weeks? y Does patient think it's not safe for a person with this condition to be physically active? n Does patient have worrying thoughts a lot of the time? y Does patient feel back pain is terrible and will never get any better? n Has patient stopped enjoying things they usually enjoy? y

## 2023-07-15 ENCOUNTER — Other Ambulatory Visit: Payer: Self-pay

## 2023-07-15 ENCOUNTER — Encounter: Payer: Self-pay | Admitting: Physical Therapy

## 2023-07-15 ENCOUNTER — Ambulatory Visit: Payer: Medicare Other | Attending: Emergency Medicine | Admitting: Physical Therapy

## 2023-07-15 DIAGNOSIS — M6281 Muscle weakness (generalized): Secondary | ICD-10-CM | POA: Diagnosis not present

## 2023-07-15 DIAGNOSIS — M4726 Other spondylosis with radiculopathy, lumbar region: Secondary | ICD-10-CM | POA: Insufficient documentation

## 2023-07-15 DIAGNOSIS — R2689 Other abnormalities of gait and mobility: Secondary | ICD-10-CM | POA: Insufficient documentation

## 2023-07-15 DIAGNOSIS — G8929 Other chronic pain: Secondary | ICD-10-CM | POA: Diagnosis not present

## 2023-07-15 DIAGNOSIS — M5442 Lumbago with sciatica, left side: Secondary | ICD-10-CM | POA: Diagnosis not present

## 2023-07-15 DIAGNOSIS — M5459 Other low back pain: Secondary | ICD-10-CM | POA: Insufficient documentation

## 2023-07-22 ENCOUNTER — Ambulatory Visit: Payer: Medicare Other | Admitting: Physical Therapy

## 2023-07-29 ENCOUNTER — Ambulatory Visit: Payer: Medicare Other

## 2023-07-29 DIAGNOSIS — M5442 Lumbago with sciatica, left side: Secondary | ICD-10-CM | POA: Diagnosis not present

## 2023-07-29 DIAGNOSIS — R2689 Other abnormalities of gait and mobility: Secondary | ICD-10-CM

## 2023-07-29 DIAGNOSIS — M6281 Muscle weakness (generalized): Secondary | ICD-10-CM | POA: Diagnosis not present

## 2023-07-29 DIAGNOSIS — G8929 Other chronic pain: Secondary | ICD-10-CM | POA: Diagnosis not present

## 2023-07-29 DIAGNOSIS — M4726 Other spondylosis with radiculopathy, lumbar region: Secondary | ICD-10-CM | POA: Diagnosis not present

## 2023-07-29 DIAGNOSIS — M5459 Other low back pain: Secondary | ICD-10-CM

## 2023-07-29 NOTE — Therapy (Signed)
 OUTPATIENT PHYSICAL THERAPY TREATMENT NOTE   Patient Name: [name] [name] MRN: [id number] DOB:1951-01-08, 73 y.o., male Today's Date: 07/29/2023  END OF SESSION:  PT End of Session - 07/29/23 0828     Visit Number 2    Number of Visits 13    Date for PT Re-Evaluation 08/26/23    Authorization Type [employer] medicare    Authorization Time Period 12 visits approved 07/15/23-08/26/23    Authorization - Visit Number 1    Authorization - Number of Visits 12    PT Start Time 0830    PT Stop Time 0908    PT Time Calculation (min) 38 min    Activity Tolerance Patient tolerated treatment well    Behavior During Therapy [facility] for tasks assessed/performed              Past Medical History:  Diagnosis Date   Allergy    seasonal allergies   Arthritis    generalized   Blood transfusion without reported diagnosis 2013   when had diverticulitis flare   BPH (benign prostatic hypertrophy)    Colon polyp    diverticular bleed with transfusion   Diverticulosis    History of kidney stones    Hyperlipidemia    on meds   Hypertension    on meds   Kidney stones    hx of 15-20 kidney stones    Obesity, unspecified    Pre-diabetes    Prostate cancer (HCC) 2020   dx 05/2019   Prostate nodule 07/27/2008   Past Surgical History:  Procedure Laterality Date   ANKLE FUSION Left 1991   COLONOSCOPY  2011   DB-F/V-miralax(good)-TICS/3 HPP   CYSTOSCOPY W/ RETROGRADES Bilateral 08/02/2022   Procedure: CYSTOSCOPY WITH BILATERAL RETROGRADE PYELOGRAM;  Surgeon: Crista Elliot, MD;  Location: [facility];  Service: Urology;  Laterality: Bilateral;   HERNIA REPAIR N/A    umbilical    IR 3D INDEPENDENT WKST  04/07/2023   IR ANGIOGRAM PELVIS SELECTIVE OR SUPRASELECTIVE  04/07/2023   IR ANGIOGRAM SELECTIVE EACH ADDITIONAL VESSEL  04/07/2023   IR ANGIOGRAM SELECTIVE EACH ADDITIONAL VESSEL  04/07/2023   IR EMBO ARTERIAL NOT HEMORR HEMANG INC GUIDE ROADMAPPING  04/07/2023   IR RADIOLOGIST EVAL & MGMT   03/19/2023   IR RADIOLOGIST EVAL & MGMT  05/07/2023   IR US GUIDE VASC ACCESS RIGHT  04/07/2023   POLYPECTOMY  2011   3 HPP   TOTAL KNEE ARTHROPLASTY Right 03/15/2022   Procedure: TOTAL KNEE ARTHROPLASTY;  Surgeon: Frederico Hamman, MD;  Location: [facility];  Service: Orthopedics;  Laterality: Right;   TRANSURETHRAL RESECTION OF BLADDER TUMOR N/A 08/02/2022   Procedure: TRANSURETHRAL RESECTION OF BLADDER TUMOR (TURBT);  Surgeon: Crista Elliot, MD;  Location: [facility];  Service: Urology;  Laterality: N/A;  60 MINS   TRANSURETHRAL RESECTION OF PROSTATE  2020   Patient Active Problem List   Diagnosis Date Noted   Osteoarthritis of spine with radiculopathy, lumbar region 07/01/2023   Chronic bilateral low back pain with left-sided sciatica 07/01/2023   Chronic insomnia 05/01/2023   S/P total knee arthroplasty, right 03/15/2022   Body mass index (BMI) of 38.0-38.9 in adult 01/10/2020   Diverticulosis 01/10/2020   Prediabetes 01/10/2020   Dyslipidemia 01/10/2020   Malignant neoplasm of prostate (HCC) 06/08/2019   Prostate enlargement 08/29/2017   Gout 10/25/2014   Essential hypertension    Benign prostatic hyperplasia    Kidney stones    Prostate nodule     PCP: Alvy Bimler,  Eilleen Kempf, MD  REFERRING PROVIDER: Georgina Quint, MD  REFERRING DIAG:  [phone] (ICD-10-CM) - Osteoarthritis of spine with radiculopathy, lumbar region  M54.42,G89.29 (ICD-10-CM) - Chronic bilateral low back pain with left-sided sciatica    Rationale for Evaluation and Treatment: Rehabilitation  THERAPY DIAG:  Other low back pain  Muscle weakness (generalized)  Other abnormalities of gait and mobility  ONSET DATE: 7 months  SUBJECTIVE:                                                                                                                                                                                           SUBJECTIVE STATEMENT: Patient reports that his pain has decreased some, rates at  a 4/10 today, states he has not had a chance to do his HEP yet due to a death in the family.   Eval statement: Can't stand for long, maybe 5 minutes, just has been dealing with it for the past 7 months. Almost feels like a spasm on both L and R side, just oges across the back. No cepahlic travel. No caudal travel. No numbness or tingling. Just sharp spasms towards the lumbosacral area. Rest reliefs pain to 0/10, standing 9/10, hurts to stand to shower.   PERTINENT HISTORY:  Prostate surgery, Ankle fusion  PAIN:  Are you having pain? Yes: NPRS scale: 0/10 Pain location: lumbosacral Pain description: sharp spasms Aggravating factors: standing  Relieving factors: sitting   PRECAUTIONS: None  RED FLAGS: Bowel or bladder incontinence: Yes: following prostate surgery in feb, 2024 and Cauda equina syndrome: No   WEIGHT BEARING RESTRICTIONS: No  FALLS:  Has patient fallen in last 6 months? No  LIVING ENVIRONMENT: Lives with: lives with their family Lives in: House/apartment Stairs: Yes: Internal: 21 steps; on right going up Has following equipment at home: Single point cane  OCCUPATION: retired   PLOF: Independent  PATIENT GOALS: reduce pain and prolong how long I can stand  NEXT MD VISIT: still needs to schedule  OBJECTIVE:  Note: Objective measures were completed at Evaluation unless otherwise noted.   PATIENT SURVEYS:  Modified Oswestry 21/50   COGNITION: Overall cognitive status: Within functional limits for tasks assessed     SENSATION: WFL  MUSCLE LENGTH: Hamstrings: Right -20 deg; Left -20 deg   POSTURE: rounded shoulders, forward head, flexed trunk , and weight shift right  PALPATION: No tenderness from palpation   Lumbar contraction pattern  L Multifidus:impaired  R Multifidus:impaired   LUMBAR ROM:   AROM eval  Flexion 70%  Extension 70%  Right lateral flexion 40%  Left lateral flexion 40%  Right rotation 40%  Left rotation  40%   (Blank rows  = not tested)  ! Indicates pain with testing  LOWER EXTREMITY ROM:     Passive  Right eval Left eval  Hip flexion 100 100  Hip extension [facility]  Hip abduction    Hip adduction    Hip internal rotation    Hip external rotation    Knee flexion    Knee extension    Ankle dorsiflexion    Ankle plantarflexion    Ankle inversion    Ankle eversion     (Blank rows = not tested)  ! Indicates pain with testing  LOWER EXTREMITY MMT:    MMT Right eval Left eval  Hip flexion 4- 4-  Hip extension 4- 4-  Hip abduction 4- 4-  Hip adduction    Hip internal rotation    Hip external rotation    Knee flexion    Knee extension    Ankle dorsiflexion    Ankle plantarflexion    Ankle inversion    Ankle eversion     (Blank rows = not tested)   ! Indicates pain with testing LUMBAR SPECIAL TESTS:  Prone instability test: Positive  FUNCTIONAL TESTS:  5 times sit to stand: 21.16s  GAIT: Distance walked: 16ft Assistive device utilized: None Level of assistance: SBA Comments: antalgic, low quality gait pattern with flexed R trunk lean.   [facility] Adult PT Treatment:                                                DATE: 07/29/23 Therapeutic Exercise: Nustep level 5 x 5 mins Seated hamstring stretch 2x30" BIL Bridges 2x10 SLR x10 BIL Supine pilates ring squeeze 5" hold 2x10 LTR x10 BIL SKTC 2x30" BIL DKTC heels on pball 2x10 Sidelying clamshell 2x10 BIL STS 2x10 arms crossed from lifted table   [facility] Adult PT Treatment:                                                DATE: 07/15/23   Neuromuscular re-ed: Core activation pattern Core bracing practice  Self Care: POC discussion Patient education                                                                                                                                PATIENT EDUCATION:  Education details: Pt received education regarding HEP performance, ADL performance, functional activity tolerance, impairment education,  appropriate performance of therapeutic activities, Core activation patterns, anatomy. Person educated: Patient Education method: Explanation, Demonstration, Tactile cues, Verbal cues, and Handouts Education comprehension: verbalized understanding and returned demonstration  HOME EXERCISE PROGRAM: Access Code: [email or web address] URL: [email or web address] Date: 07/15/2023 Prepared by: Sheliah Plane  Exercises -  Prone Hip Extension  - 1 x daily - 5 x weekly - 2 sets - 8 reps - 8s hold - Supine Single Knee to Chest Stretch  - 2 x daily - 5-7 x weekly - 2 sets - 1 reps - 79m hold - Supine Lower Trunk Rotation  - 1 x daily - 5 x weekly - 2-3 sets - 10 reps - 3s hold - Supine Bridge  - 1 x daily - 5 x weekly - 2-3 sets - 8 reps - 5s hold - Sit to Stand with Resistance Around Legs  - 1 x daily - 3-5 x weekly - 2 sets - 12 reps  ASSESSMENT:  CLINICAL IMPRESSION: Patient presents to first follow up PT session reporting improvements in his lower back pain but that he has not been able to complete HEP yet. Session today focused on LE and core strengthening as well as lumbar mobility. Patient was able to tolerate all prescribed exercises with no adverse effects. Plan to incorporate more sanding exercises in future session as tolerable. Patient continues to benefit from skilled PT services and should be progressed as able to improve functional independence.   Eval impression: Pt. attended today's physical therapy session for evaluation of low back pain. Pt has complaints of intoleracne to standing and pain in the low back. Pt has notable deficits with core activation patterns, BLE strength, B hip mobility, Lumbar AROM, and posture in stance.  Pt would benefit from therapeutic focus on postural education, Lumbar ROM, core activation during functional tasks, and standing activity tolerance.  Treatment performed today focused on foundational exercises for core activation and HEP independence. Pt  demonstrated great understanding of education provided. required minimal cues and stand by assistance during ambulationfor appropriate performance with today's activities. Pt requires the intervention of skilled outpatient physical therapy to address the aforementioned deficits and progress towards a functional level in line with therapeutic goals.    OBJECTIVE IMPAIRMENTS: Abnormal gait, decreased activity tolerance, decreased mobility, difficulty walking, decreased ROM, decreased strength, impaired flexibility, impaired tone, and pain.   ACTIVITY LIMITATIONS: carrying, lifting, bending, standing, squatting, continence, and locomotion level  PARTICIPATION LIMITATIONS: interpersonal relationship, driving, shopping, community activity, and yard work  PERSONAL FACTORS: Age, Fitness, and Past/current experiences are also affecting patient's functional outcome.   REHAB POTENTIAL: Fair personal factors  CLINICAL DECISION MAKING: Stable/uncomplicated  EVALUATION COMPLEXITY: Low   GOALS: Goals reviewed with patient? YES  SHORT TERM GOALS: Target date: 08/05/2023    Pt will be independent with administered HEP to demonstrate the competency necessary for long term managemnet of symptoms at home. Baseline: Goal status: INITIAL Pt will perform a 5 time STS in less than or equal to 16 seconds to demonstrate improved BLE strength and function for transfer tasks necessary for improving standing tolerance. Baseline: 21s Goal status: INITIAL     LONG TERM GOALS: Target date: 08/26/2023    Pt. Will achieve a MODI score of 33/50 as to demonstrate improvement in self-perceived functional ability with daily activities.  Baseline: 21 Goal status: INITIAL  2.  Pt will improve Global hip strength to a 4+/5 to demonstrate improvement in strength for quality of motion and activity performance.  Baseline: 4- Goal status: INITIAL  3.  Pt will improve Lumbar AROM to 80% of standardized norms with  less than 4/10 pain to demonstrate necessary mobility for high quality and safe ADLs  Baseline: 40% Goal status: INITIAL  4.  Pt will report the ability to stand for >/= 15  minutes as to demonstrate improved tolerance to standing for prolonged time and improved ability to participate in ADLs.  Baseline:  Goal status: INITIAL  PLAN:  PT FREQUENCY: 2x/week  PT DURATION: 6 weeks  PLANNED INTERVENTIONS: 97110-Therapeutic exercises, 97530- Therapeutic activity, [id number]- Neuromuscular re-education, 97535- Self Care, [id number]- Manual therapy, [phone]- Gait training, Patient/Family education, Stair training, Dry Needling, Joint mobilization, and Spinal mobilization.  PLAN FOR NEXT SESSION: Review HEP, Begin POC as detailed in assessment   Berta Minor PTA 07/29/2023, 9:09 AM

## 2023-07-31 ENCOUNTER — Ambulatory Visit: Payer: Medicare Other

## 2023-08-05 ENCOUNTER — Ambulatory Visit: Payer: Medicare Other | Admitting: Physical Therapy

## 2023-08-11 ENCOUNTER — Encounter: Payer: Self-pay | Admitting: Physical Therapy

## 2023-08-11 ENCOUNTER — Ambulatory Visit: Payer: Medicare Other | Attending: Emergency Medicine | Admitting: Physical Therapy

## 2023-08-11 DIAGNOSIS — R2689 Other abnormalities of gait and mobility: Secondary | ICD-10-CM | POA: Insufficient documentation

## 2023-08-11 DIAGNOSIS — M6281 Muscle weakness (generalized): Secondary | ICD-10-CM | POA: Diagnosis not present

## 2023-08-11 DIAGNOSIS — M5459 Other low back pain: Secondary | ICD-10-CM | POA: Insufficient documentation

## 2023-08-11 NOTE — Therapy (Signed)
 OUTPATIENT PHYSICAL THERAPY TREATMENT NOTE   Patient Name: [name] [name] MRN: [id number] DOB:08-Jun-1951, 73 y.o., male Today's Date: 08/11/2023  END OF SESSION:  PT End of Session - 08/11/23 0909     Visit Number 3    Number of Visits 13    Date for PT Re-Evaluation 08/26/23    Authorization Time Period 12 visits approved 07/15/23-08/26/23    Authorization - Visit Number 2    Authorization - Number of Visits 12    PT Start Time 0910    PT Stop Time 0952    PT Time Calculation (min) 42 min    Activity Tolerance Patient tolerated treatment well    Behavior During Therapy [facility] for tasks assessed/performed               Past Medical History:  Diagnosis Date   Allergy    seasonal allergies   Arthritis    generalized   Blood transfusion without reported diagnosis 2013   when had diverticulitis flare   BPH (benign prostatic hypertrophy)    Colon polyp    diverticular bleed with transfusion   Diverticulosis    History of kidney stones    Hyperlipidemia    on meds   Hypertension    on meds   Kidney stones    hx of 15-20 kidney stones    Obesity, unspecified    Pre-diabetes    Prostate cancer (HCC) 2020   dx 05/2019   Prostate nodule 07/27/2008   Past Surgical History:  Procedure Laterality Date   ANKLE FUSION Left 1991   COLONOSCOPY  2011   DB-F/V-miralax(good)-TICS/3 HPP   CYSTOSCOPY W/ RETROGRADES Bilateral 08/02/2022   Procedure: CYSTOSCOPY WITH BILATERAL RETROGRADE PYELOGRAM;  Surgeon: Crista Elliot, MD;  Location: [facility];  Service: Urology;  Laterality: Bilateral;   HERNIA REPAIR N/A    umbilical    IR 3D INDEPENDENT WKST  04/07/2023   IR ANGIOGRAM PELVIS SELECTIVE OR SUPRASELECTIVE  04/07/2023   IR ANGIOGRAM SELECTIVE EACH ADDITIONAL VESSEL  04/07/2023   IR ANGIOGRAM SELECTIVE EACH ADDITIONAL VESSEL  04/07/2023   IR EMBO ARTERIAL NOT HEMORR HEMANG INC GUIDE ROADMAPPING  04/07/2023   IR RADIOLOGIST EVAL & MGMT  03/19/2023   IR RADIOLOGIST EVAL & MGMT   05/07/2023   IR US GUIDE VASC ACCESS RIGHT  04/07/2023   POLYPECTOMY  2011   3 HPP   TOTAL KNEE ARTHROPLASTY Right 03/15/2022   Procedure: TOTAL KNEE ARTHROPLASTY;  Surgeon: Frederico Hamman, MD;  Location: [facility];  Service: Orthopedics;  Laterality: Right;   TRANSURETHRAL RESECTION OF BLADDER TUMOR N/A 08/02/2022   Procedure: TRANSURETHRAL RESECTION OF BLADDER TUMOR (TURBT);  Surgeon: Crista Elliot, MD;  Location: [facility];  Service: Urology;  Laterality: N/A;  60 MINS   TRANSURETHRAL RESECTION OF PROSTATE  2020   Patient Active Problem List   Diagnosis Date Noted   Osteoarthritis of spine with radiculopathy, lumbar region 07/01/2023   Chronic bilateral low back pain with left-sided sciatica 07/01/2023   Chronic insomnia 05/01/2023   S/P total knee arthroplasty, right 03/15/2022   Body mass index (BMI) of 38.0-38.9 in adult 01/10/2020   Diverticulosis 01/10/2020   Prediabetes 01/10/2020   Dyslipidemia 01/10/2020   Malignant neoplasm of prostate (HCC) 06/08/2019   Prostate enlargement 08/29/2017   Gout 10/25/2014   Essential hypertension    Benign prostatic hyperplasia    Kidney stones    Prostate nodule     PCP: Georgina Quint, MD  REFERRING PROVIDER:  Georgina Quint, MD  REFERRING DIAG:  [phone] (ICD-10-CM) - Osteoarthritis of spine with radiculopathy, lumbar region  M54.42,G89.29 (ICD-10-CM) - Chronic bilateral low back pain with left-sided sciatica    Rationale for Evaluation and Treatment: Rehabilitation  THERAPY DIAG:  Other low back pain  Muscle weakness (generalized)  Other abnormalities of gait and mobility  ONSET DATE: 7 months  SUBJECTIVE:                                                                                                                                                                                           SUBJECTIVE STATEMENT: Pt stated that pain is only a 2/10 today and has begun his HEP, doing exercises that he can. Having  some difficulties with prone hip extensions and STS, however is doing the rest.  Eval statement: Can't stand for long, maybe 5 minutes, just has been dealing with it for the past 7 months. Almost feels like a spasm on both L and R side, just oges across the back. No cepahlic travel. No caudal travel. No numbness or tingling. Just sharp spasms towards the lumbosacral area. Rest reliefs pain to 0/10, standing 9/10, hurts to stand to shower.   PERTINENT HISTORY:  Prostate surgery, Ankle fusion  PAIN:  Are you having pain? Yes: NPRS scale: 0/10 Pain location: lumbosacral Pain description: sharp spasms Aggravating factors: standing  Relieving factors: sitting   PRECAUTIONS: None  RED FLAGS: Bowel or bladder incontinence: Yes: following prostate surgery in feb, 2024 and Cauda equina syndrome: No   WEIGHT BEARING RESTRICTIONS: No  FALLS:  Has patient fallen in last 6 months? No  LIVING ENVIRONMENT: Lives with: lives with their family Lives in: House/apartment Stairs: Yes: Internal: 21 steps; on right going up Has following equipment at home: Single point cane  OCCUPATION: retired   PLOF: Independent  PATIENT GOALS: reduce pain and prolong how long I can stand  NEXT MD VISIT: still needs to schedule  OBJECTIVE:  Note: Objective measures were completed at Evaluation unless otherwise noted.   PATIENT SURVEYS:  Modified Oswestry 21/50   COGNITION: Overall cognitive status: Within functional limits for tasks assessed     SENSATION: WFL  MUSCLE LENGTH: Hamstrings: Right -20 deg; Left -20 deg   POSTURE: rounded shoulders, forward head, flexed trunk , and weight shift right  PALPATION: No tenderness from palpation   Lumbar contraction pattern  L Multifidus:impaired  R Multifidus:impaired   LUMBAR ROM:   AROM eval  Flexion 70%  Extension 70%  Right lateral flexion 40%  Left lateral flexion 40%  Right rotation 40%  Left rotation 40%   (Blank rows =  not  tested)  ! Indicates pain with testing  LOWER EXTREMITY ROM:     Passive  Right eval Left eval  Hip flexion 100 100  Hip extension [facility]  Hip abduction    Hip adduction    Hip internal rotation    Hip external rotation    Knee flexion    Knee extension    Ankle dorsiflexion    Ankle plantarflexion    Ankle inversion    Ankle eversion     (Blank rows = not tested)  ! Indicates pain with testing  LOWER EXTREMITY MMT:    MMT Right eval Left eval  Hip flexion 4- 4-  Hip extension 4- 4-  Hip abduction 4- 4-  Hip adduction    Hip internal rotation    Hip external rotation    Knee flexion    Knee extension    Ankle dorsiflexion    Ankle plantarflexion    Ankle inversion    Ankle eversion     (Blank rows = not tested)   ! Indicates pain with testing LUMBAR SPECIAL TESTS:  Prone instability test: Positive  FUNCTIONAL TESTS:  5 times sit to stand: 21.16s  GAIT: Distance walked: 57ft Assistive device utilized: None Level of assistance: SBA Comments: antalgic, low quality gait pattern with flexed R trunk lean.    [facility] Adult PT Treatment:                                                DATE: 08/11/2023  Therapeutic Exercise: NuStep 5' Figure 4 on Pball 1x20 reps, 3s hold, both sides  Therapeutic Activity: Axe chops with Cables 2x12, 2s hold, 10lbs Standing marches with YTB 2x8, 3s hold Flexion rows with spring board 2x15, 15ft from board   [facility] Adult PT Treatment:                                                DATE: 07/29/23 Therapeutic Exercise: Nustep level 5 x 5 mins Seated hamstring stretch 2x30" BIL Bridges 2x10 SLR x10 BIL Supine pilates ring squeeze 5" hold 2x10 LTR x10 BIL SKTC 2x30" BIL DKTC heels on pball 2x10 Sidelying clamshell 2x10 BIL STS 2x10 arms crossed from lifted table    PATIENT EDUCATION:  Education details: Pt received education regarding HEP performance, ADL performance, functional activity tolerance, impairment education,  appropriate performance of therapeutic activities, Core activation patterns, anatomy. Person educated: Patient Education method: Explanation, Demonstration, Tactile cues, Verbal cues, and Handouts Education comprehension: verbalized understanding and returned demonstration  HOME EXERCISE PROGRAM: Access Code: [email or web address] URL: [email or web address] Date: 07/15/2023 Prepared by: Sheliah Plane  Exercises - Prone Hip Extension  - 1 x daily - 5 x weekly - 2 sets - 8 reps - 8s hold - Supine Single Knee to Chest Stretch  - 2 x daily - 5-7 x weekly - 2 sets - 1 reps - 31m hold - Supine Lower Trunk Rotation  - 1 x daily - 5 x weekly - 2-3 sets - 10 reps - 3s hold - Supine Bridge  - 1 x daily - 5 x weekly - 2-3 sets - 8 reps - 5s hold - Sit to Stand with Resistance Around Legs  - 1 x daily -  3-5 x weekly - 2 sets - 12 reps  ASSESSMENT:  CLINICAL IMPRESSION: Pt attended physical therapy session for continuation of treatment regarding low back pain. Today's treatment focused on improvement of  dynamic core stability, hip/lumbar mobility, hip strengthening, and standing tolerance. Pt showed  great tolerance to treatment and demonstrated improvement with lumbar mobility, hip strength . Some difficulties continue with HEP performance and reported transfer tolerance. Pt required minimal verbal/tactile cuing as well as no assistance for safe and appropriate performance of today's activities. Continue with therapeutic focus on dynamic core stability, Hip/lumbar mobility, standing tolerance, transfer quality, and global hip strengthening. Rhys Martini impression: Pt. attended today's physical therapy session for evaluation of low back pain. Pt has complaints of intoleracne to standing and pain in the low back. Pt has notable deficits with core activation patterns, BLE strength, B hip mobility, Lumbar AROM, and posture in stance.  Pt would benefit from therapeutic focus on postural education, Lumbar ROM,  core activation during functional tasks, and standing activity tolerance.  Treatment performed today focused on foundational exercises for core activation and HEP independence. Pt demonstrated great understanding of education provided. required minimal cues and stand by assistance during ambulationfor appropriate performance with today's activities. Pt requires the intervention of skilled outpatient physical therapy to address the aforementioned deficits and progress towards a functional level in line with therapeutic goals.    OBJECTIVE IMPAIRMENTS: Abnormal gait, decreased activity tolerance, decreased mobility, difficulty walking, decreased ROM, decreased strength, impaired flexibility, impaired tone, and pain.   ACTIVITY LIMITATIONS: carrying, lifting, bending, standing, squatting, continence, and locomotion level  PARTICIPATION LIMITATIONS: interpersonal relationship, driving, shopping, community activity, and yard work  PERSONAL FACTORS: Age, Fitness, and Past/current experiences are also affecting patient's functional outcome.   REHAB POTENTIAL: Fair personal factors  CLINICAL DECISION MAKING: Stable/uncomplicated  EVALUATION COMPLEXITY: Low   GOALS: Goals reviewed with patient? YES  SHORT TERM GOALS: Target date: 08/05/2023    Pt will be independent with administered HEP to demonstrate the competency necessary for long term managemnet of symptoms at home. Baseline: Goal status: INITIAL Pt will perform a 5 time STS in less than or equal to 16 seconds to demonstrate improved BLE strength and function for transfer tasks necessary for improving standing tolerance. Baseline: 21s Goal status: INITIAL     LONG TERM GOALS: Target date: 08/26/2023    Pt. Will achieve a MODI score of 33/50 as to demonstrate improvement in self-perceived functional ability with daily activities.  Baseline: 21 Goal status: INITIAL  2.  Pt will improve Global hip strength to a 4+/5 to  demonstrate improvement in strength for quality of motion and activity performance.  Baseline: 4- Goal status: INITIAL  3.  Pt will improve Lumbar AROM to 80% of standardized norms with less than 4/10 pain to demonstrate necessary mobility for high quality and safe ADLs  Baseline: 40% Goal status: INITIAL  4.  Pt will report the ability to stand for >/= 15 minutes as to demonstrate improved tolerance to standing for prolonged time and improved ability to participate in ADLs.  Baseline:  Goal status: INITIAL  PLAN:  PT FREQUENCY: 2x/week  PT DURATION: 6 weeks  PLANNED INTERVENTIONS: 97110-Therapeutic exercises, 97530- Therapeutic activity, [id number]- Neuromuscular re-education, 97535- Self Care, [id number]- Manual therapy, [phone]- Gait training, Patient/Family education, Stair training, Dry Needling, Joint mobilization, and Spinal mobilization.  PLAN FOR NEXT SESSION: dynamic core stability, Hip/lumbar mobility, standing tolerance, transfer quality, and global hip strengthening.  Sheliah Plane, PT, DPT 08/11/2023, 9:53  AM

## 2023-08-12 ENCOUNTER — Ambulatory Visit: Payer: Medicare Other | Admitting: Physical Therapy

## 2023-08-12 ENCOUNTER — Encounter: Payer: Self-pay | Admitting: Physical Therapy

## 2023-08-12 DIAGNOSIS — R2689 Other abnormalities of gait and mobility: Secondary | ICD-10-CM | POA: Diagnosis not present

## 2023-08-12 DIAGNOSIS — M6281 Muscle weakness (generalized): Secondary | ICD-10-CM | POA: Diagnosis not present

## 2023-08-12 DIAGNOSIS — M5459 Other low back pain: Secondary | ICD-10-CM | POA: Diagnosis not present

## 2023-08-12 NOTE — Therapy (Signed)
 OUTPATIENT PHYSICAL THERAPY TREATMENT NOTE   Patient Name: [name] [name] MRN: [id number] DOB:02/24/1951, 73 y.o., male Today's Date: 08/12/2023  END OF SESSION:  PT End of Session - 08/12/23 0831     Visit Number 4    Number of Visits 13    Date for PT Re-Evaluation 08/26/23    Authorization Type [employer] medicare    Authorization Time Period 12 visits approved 07/15/23-08/26/23    Authorization - Visit Number 3    Authorization - Number of Visits 12    PT Start Time 0830    PT Stop Time 0910    PT Time Calculation (min) 40 min    Activity Tolerance Patient tolerated treatment well    Behavior During Therapy [facility] for tasks assessed/performed                Past Medical History:  Diagnosis Date   Allergy    seasonal allergies   Arthritis    generalized   Blood transfusion without reported diagnosis 2013   when had diverticulitis flare   BPH (benign prostatic hypertrophy)    Colon polyp    diverticular bleed with transfusion   Diverticulosis    History of kidney stones    Hyperlipidemia    on meds   Hypertension    on meds   Kidney stones    hx of 15-20 kidney stones    Obesity, unspecified    Pre-diabetes    Prostate cancer (HCC) 2020   dx 05/2019   Prostate nodule 07/27/2008   Past Surgical History:  Procedure Laterality Date   ANKLE FUSION Left 1991   COLONOSCOPY  2011   DB-F/V-miralax(good)-TICS/3 HPP   CYSTOSCOPY W/ RETROGRADES Bilateral 08/02/2022   Procedure: CYSTOSCOPY WITH BILATERAL RETROGRADE PYELOGRAM;  Surgeon: Crista Elliot, MD;  Location: [facility];  Service: Urology;  Laterality: Bilateral;   HERNIA REPAIR N/A    umbilical    IR 3D INDEPENDENT WKST  04/07/2023   IR ANGIOGRAM PELVIS SELECTIVE OR SUPRASELECTIVE  04/07/2023   IR ANGIOGRAM SELECTIVE EACH ADDITIONAL VESSEL  04/07/2023   IR ANGIOGRAM SELECTIVE EACH ADDITIONAL VESSEL  04/07/2023   IR EMBO ARTERIAL NOT HEMORR HEMANG INC GUIDE ROADMAPPING  04/07/2023   IR RADIOLOGIST EVAL & MGMT   03/19/2023   IR RADIOLOGIST EVAL & MGMT  05/07/2023   IR US GUIDE VASC ACCESS RIGHT  04/07/2023   POLYPECTOMY  2011   3 HPP   TOTAL KNEE ARTHROPLASTY Right 03/15/2022   Procedure: TOTAL KNEE ARTHROPLASTY;  Surgeon: Frederico Hamman, MD;  Location: [facility];  Service: Orthopedics;  Laterality: Right;   TRANSURETHRAL RESECTION OF BLADDER TUMOR N/A 08/02/2022   Procedure: TRANSURETHRAL RESECTION OF BLADDER TUMOR (TURBT);  Surgeon: Crista Elliot, MD;  Location: [facility];  Service: Urology;  Laterality: N/A;  60 MINS   TRANSURETHRAL RESECTION OF PROSTATE  2020   Patient Active Problem List   Diagnosis Date Noted   Osteoarthritis of spine with radiculopathy, lumbar region 07/01/2023   Chronic bilateral low back pain with left-sided sciatica 07/01/2023   Chronic insomnia 05/01/2023   S/P total knee arthroplasty, right 03/15/2022   Body mass index (BMI) of 38.0-38.9 in adult 01/10/2020   Diverticulosis 01/10/2020   Prediabetes 01/10/2020   Dyslipidemia 01/10/2020   Malignant neoplasm of prostate (HCC) 06/08/2019   Prostate enlargement 08/29/2017   Gout 10/25/2014   Essential hypertension    Benign prostatic hyperplasia    Kidney stones    Prostate nodule  PCP: Georgina Quint, MD  REFERRING PROVIDER: Georgina Quint, MD  REFERRING DIAG:  [phone] (ICD-10-CM) - Osteoarthritis of spine with radiculopathy, lumbar region  M54.42,G89.29 (ICD-10-CM) - Chronic bilateral low back pain with left-sided sciatica    Rationale for Evaluation and Treatment: Rehabilitation  THERAPY DIAG:  Other low back pain  Muscle weakness (generalized)  Other abnormalities of gait and mobility  ONSET DATE: 7 months  SUBJECTIVE:                                                                                                                                                                                           SUBJECTIVE STATEMENT: Pt stated feeling good even with the back to back session,  still at roughly 2/10 pain in the low back, across the back. Didn't perform HEP last night because of having PT.  Eval statement: Can't stand for long, maybe 5 minutes, just has been dealing with it for the past 7 months. Almost feels like a spasm on both L and R side, just oges across the back. No cepahlic travel. No caudal travel. No numbness or tingling. Just sharp spasms towards the lumbosacral area. Rest reliefs pain to 0/10, standing 9/10, hurts to stand to shower.   PERTINENT HISTORY:  Prostate surgery, Ankle fusion  PAIN:  Are you having pain? Yes: NPRS scale: 0/10 Pain location: lumbosacral Pain description: sharp spasms Aggravating factors: standing  Relieving factors: sitting   PRECAUTIONS: None  RED FLAGS: Bowel or bladder incontinence: Yes: following prostate surgery in feb, 2024 and Cauda equina syndrome: No   WEIGHT BEARING RESTRICTIONS: No  FALLS:  Has patient fallen in last 6 months? No  LIVING ENVIRONMENT: Lives with: lives with their family Lives in: House/apartment Stairs: Yes: Internal: 21 steps; on right going up Has following equipment at home: Single point cane  OCCUPATION: retired   PLOF: Independent  PATIENT GOALS: reduce pain and prolong how long I can stand  NEXT MD VISIT: still needs to schedule  OBJECTIVE:  Note: Objective measures were completed at Evaluation unless otherwise noted.   PATIENT SURVEYS:  Modified Oswestry 21/50   COGNITION: Overall cognitive status: Within functional limits for tasks assessed     SENSATION: WFL  MUSCLE LENGTH: Hamstrings: Right -20 deg; Left -20 deg   POSTURE: rounded shoulders, forward head, flexed trunk , and weight shift right  PALPATION: No tenderness from palpation   Lumbar contraction pattern  L Multifidus:impaired  R Multifidus:impaired   LUMBAR ROM:   AROM eval  Flexion 70%  Extension 70%  Right lateral flexion 40%  Left lateral flexion 40%  Right rotation 40%  Left  rotation  40%   (Blank rows = not tested)  ! Indicates pain with testing  LOWER EXTREMITY ROM:     Passive  Right eval Left eval  Hip flexion 100 100  Hip extension [facility]  Hip abduction    Hip adduction    Hip internal rotation    Hip external rotation    Knee flexion    Knee extension    Ankle dorsiflexion    Ankle plantarflexion    Ankle inversion    Ankle eversion     (Blank rows = not tested)  ! Indicates pain with testing  LOWER EXTREMITY MMT:    MMT Right eval Left eval  Hip flexion 4- 4-  Hip extension 4- 4-  Hip abduction 4- 4-  Hip adduction    Hip internal rotation    Hip external rotation    Knee flexion    Knee extension    Ankle dorsiflexion    Ankle plantarflexion    Ankle inversion    Ankle eversion     (Blank rows = not tested)   ! Indicates pain with testing LUMBAR SPECIAL TESTS:  Prone instability test: Positive  FUNCTIONAL TESTS:  5 times sit to stand: 21.16s  GAIT: Distance walked: 26ft Assistive device utilized: None Level of assistance: SBA Comments: antalgic, low quality gait pattern with flexed R trunk lean.    [facility] Adult PT Treatment:                                                DATE: 08/12/2023  Therapeutic Exercise: NuStep 8'  Therapeutic Activity: Bridge with core/pelvic floor brace  2x12, 3s hold Split stance lumbar rotation w/core bracing 2x8 UE assist, 10# KB Flexion rows with spring board 2x15, 36ft from board Hip 3 ways 2x12, 4# cuff  [facility] Adult PT Treatment:                                                DATE: 08/11/2023  Therapeutic Exercise: NuStep 5' Figure 4 on Pball 1x20 reps, 3s hold, both sides  Therapeutic Activity: Axe chops with Cables 2x12, 2s hold, 10lbs Standing marches with YTB 2x8, 3s hold Flexion rows with spring board 2x15, 15ft from board    PATIENT EDUCATION:  Education details: Pt received education regarding HEP performance, ADL performance, functional activity tolerance, impairment  education, appropriate performance of therapeutic activities, Core activation patterns, anatomy. Person educated: Patient Education method: Explanation, Demonstration, Tactile cues, Verbal cues, and Handouts Education comprehension: verbalized understanding and returned demonstration  HOME EXERCISE PROGRAM: Access Code: [email or web address] URL: [email or web address] Date: 07/15/2023 Prepared by: Sheliah Plane  Exercises - Prone Hip Extension  - 1 x daily - 5 x weekly - 2 sets - 8 reps - 8s hold - Supine Single Knee to Chest Stretch  - 2 x daily - 5-7 x weekly - 2 sets - 1 reps - 56m hold - Supine Lower Trunk Rotation  - 1 x daily - 5 x weekly - 2-3 sets - 10 reps - 3s hold - Supine Bridge  - 1 x daily - 5 x weekly - 2-3 sets - 8 reps - 5s hold - Sit to Stand with Resistance Around Legs  - 1  x daily - 3-5 x weekly - 2 sets - 12 reps  ASSESSMENT:  CLINICAL IMPRESSION: Pt attended physical therapy session for continuation of treatment regarding low back pain. Today's treatment focused on improvement of  core bracing, dynamic core stability, stance tolerance, and global hip strength. Pt showed  great tolerance to treatment and demonstrated improvement with core bracing as well as stance tolerance and global hip strength during hip 3 ways. Some difficulties continue with pain reduction between sessions. Pt required minimal cuing as well as stand by assistance for safe and appropriate performance of today's activities.Continue with therapeutic focus on dynamic core stability, Hip/lumbar mobility, standing tolerance, transfer quality, and global hip strengthening.   Eval impression: Pt. attended today's physical therapy session for evaluation of low back pain. Pt has complaints of intoleracne to standing and pain in the low back. Pt has notable deficits with core activation patterns, BLE strength, B hip mobility, Lumbar AROM, and posture in stance.  Pt would benefit from therapeutic focus on  postural education, Lumbar ROM, core activation during functional tasks, and standing activity tolerance.  Treatment performed today focused on foundational exercises for core activation and HEP independence. Pt demonstrated great understanding of education provided. required minimal cues and stand by assistance during ambulationfor appropriate performance with today's activities. Pt requires the intervention of skilled outpatient physical therapy to address the aforementioned deficits and progress towards a functional level in line with therapeutic goals.    OBJECTIVE IMPAIRMENTS: Abnormal gait, decreased activity tolerance, decreased mobility, difficulty walking, decreased ROM, decreased strength, impaired flexibility, impaired tone, and pain.   ACTIVITY LIMITATIONS: carrying, lifting, bending, standing, squatting, continence, and locomotion level  PARTICIPATION LIMITATIONS: interpersonal relationship, driving, shopping, community activity, and yard work  PERSONAL FACTORS: Age, Fitness, and Past/current experiences are also affecting patient's functional outcome.   REHAB POTENTIAL: Fair personal factors  CLINICAL DECISION MAKING: Stable/uncomplicated  EVALUATION COMPLEXITY: Low   GOALS: Goals reviewed with patient? YES  SHORT TERM GOALS: Target date: 08/05/2023    Pt will be independent with administered HEP to demonstrate the competency necessary for long term managemnet of symptoms at home. Baseline: Goal status: INITIAL Pt will perform a 5 time STS in less than or equal to 16 seconds to demonstrate improved BLE strength and function for transfer tasks necessary for improving standing tolerance. Baseline: 21s Goal status: INITIAL     LONG TERM GOALS: Target date: 08/26/2023    Pt. Will achieve a MODI score of 33/50 as to demonstrate improvement in self-perceived functional ability with daily activities.  Baseline: 21 Goal status: INITIAL  2.  Pt will improve Global hip  strength to a 4+/5 to demonstrate improvement in strength for quality of motion and activity performance.  Baseline: 4- Goal status: INITIAL  3.  Pt will improve Lumbar AROM to 80% of standardized norms with less than 4/10 pain to demonstrate necessary mobility for high quality and safe ADLs  Baseline: 40% Goal status: INITIAL  4.  Pt will report the ability to stand for >/= 15 minutes as to demonstrate improved tolerance to standing for prolonged time and improved ability to participate in ADLs.  Baseline:  Goal status: INITIAL  PLAN:  PT FREQUENCY: 2x/week  PT DURATION: 6 weeks  PLANNED INTERVENTIONS: 97110-Therapeutic exercises, 97530- Therapeutic activity, [id number]- Neuromuscular re-education, 97535- Self Care, [id number]- Manual therapy, [phone]- Gait training, Patient/Family education, Stair training, Dry Needling, Joint mobilization, and Spinal mobilization.  PLAN FOR NEXT SESSION: Continue with therapeutic focus on dynamic core stability, Hip/lumbar mobility,  standing tolerance, transfer quality, and global hip strengthening.  Sheliah Plane, PT, DPT 08/12/2023, 9:11 AM

## 2023-08-14 ENCOUNTER — Ambulatory Visit: Payer: Medicare Other

## 2023-08-14 DIAGNOSIS — M5459 Other low back pain: Secondary | ICD-10-CM

## 2023-08-14 DIAGNOSIS — M6281 Muscle weakness (generalized): Secondary | ICD-10-CM | POA: Diagnosis not present

## 2023-08-14 DIAGNOSIS — R2689 Other abnormalities of gait and mobility: Secondary | ICD-10-CM

## 2023-08-14 NOTE — Therapy (Signed)
 OUTPATIENT PHYSICAL THERAPY TREATMENT NOTE   Patient Name: [name] [name] MRN: [id number] DOB:June 29, 1950, 73 y.o., male Today's Date: 08/14/2023  END OF SESSION:  PT End of Session - 08/14/23 0831     Visit Number 5    Number of Visits 13    Date for PT Re-Evaluation 08/26/23    Authorization Type [employer] medicare    Authorization Time Period 12 visits approved 07/15/23-08/26/23    Authorization - Visit Number 4    Authorization - Number of Visits 12    PT Start Time 0831    PT Stop Time 0909    PT Time Calculation (min) 38 min    Activity Tolerance Patient tolerated treatment well    Behavior During Therapy [facility] for tasks assessed/performed             Past Medical History:  Diagnosis Date   Allergy    seasonal allergies   Arthritis    generalized   Blood transfusion without reported diagnosis 2013   when had diverticulitis flare   BPH (benign prostatic hypertrophy)    Colon polyp    diverticular bleed with transfusion   Diverticulosis    History of kidney stones    Hyperlipidemia    on meds   Hypertension    on meds   Kidney stones    hx of 15-20 kidney stones    Obesity, unspecified    Pre-diabetes    Prostate cancer (HCC) 2020   dx 05/2019   Prostate nodule 07/27/2008   Past Surgical History:  Procedure Laterality Date   ANKLE FUSION Left 1991   COLONOSCOPY  2011   DB-F/V-miralax(good)-TICS/3 HPP   CYSTOSCOPY W/ RETROGRADES Bilateral 08/02/2022   Procedure: CYSTOSCOPY WITH BILATERAL RETROGRADE PYELOGRAM;  Surgeon: Crista Elliot, MD;  Location: [facility];  Service: Urology;  Laterality: Bilateral;   HERNIA REPAIR N/A    umbilical    IR 3D INDEPENDENT WKST  04/07/2023   IR ANGIOGRAM PELVIS SELECTIVE OR SUPRASELECTIVE  04/07/2023   IR ANGIOGRAM SELECTIVE EACH ADDITIONAL VESSEL  04/07/2023   IR ANGIOGRAM SELECTIVE EACH ADDITIONAL VESSEL  04/07/2023   IR EMBO ARTERIAL NOT HEMORR HEMANG INC GUIDE ROADMAPPING  04/07/2023   IR RADIOLOGIST EVAL & MGMT  03/19/2023    IR RADIOLOGIST EVAL & MGMT  05/07/2023   IR US GUIDE VASC ACCESS RIGHT  04/07/2023   POLYPECTOMY  2011   3 HPP   TOTAL KNEE ARTHROPLASTY Right 03/15/2022   Procedure: TOTAL KNEE ARTHROPLASTY;  Surgeon: Frederico Hamman, MD;  Location: [facility];  Service: Orthopedics;  Laterality: Right;   TRANSURETHRAL RESECTION OF BLADDER TUMOR N/A 08/02/2022   Procedure: TRANSURETHRAL RESECTION OF BLADDER TUMOR (TURBT);  Surgeon: Crista Elliot, MD;  Location: [facility];  Service: Urology;  Laterality: N/A;  60 MINS   TRANSURETHRAL RESECTION OF PROSTATE  2020   Patient Active Problem List   Diagnosis Date Noted   Osteoarthritis of spine with radiculopathy, lumbar region 07/01/2023   Chronic bilateral low back pain with left-sided sciatica 07/01/2023   Chronic insomnia 05/01/2023   S/P total knee arthroplasty, right 03/15/2022   Body mass index (BMI) of 38.0-38.9 in adult 01/10/2020   Diverticulosis 01/10/2020   Prediabetes 01/10/2020   Dyslipidemia 01/10/2020   Malignant neoplasm of prostate (HCC) 06/08/2019   Prostate enlargement 08/29/2017   Gout 10/25/2014   Essential hypertension    Benign prostatic hyperplasia    Kidney stones    Prostate nodule     PCP: Edwina Barth  Jose, MD  REFERRING PROVIDER: Georgina Quint, MD  REFERRING DIAG:  [phone] (ICD-10-CM) - Osteoarthritis of spine with radiculopathy, lumbar region  M54.42,G89.29 (ICD-10-CM) - Chronic bilateral low back pain with left-sided sciatica    Rationale for Evaluation and Treatment: Rehabilitation  THERAPY DIAG:  Other low back pain  Muscle weakness (generalized)  Other abnormalities of gait and mobility  ONSET DATE: 7 months  SUBJECTIVE:                                                                                                                                                                                           SUBJECTIVE STATEMENT: Patient reports that his lower back pain is a little higher today,  states he woke up to more pain.   Eval statement: Can't stand for long, maybe 5 minutes, just has been dealing with it for the past 7 months. Almost feels like a spasm on both L and R side, just oges across the back. No cepahlic travel. No caudal travel. No numbness or tingling. Just sharp spasms towards the lumbosacral area. Rest reliefs pain to 0/10, standing 9/10, hurts to stand to shower.   PERTINENT HISTORY:  Prostate surgery, Ankle fusion  PAIN:  Are you having pain? Yes: NPRS scale: 0/10 Pain location: lumbosacral Pain description: sharp spasms Aggravating factors: standing  Relieving factors: sitting   PRECAUTIONS: None  RED FLAGS: Bowel or bladder incontinence: Yes: following prostate surgery in feb, 2024 and Cauda equina syndrome: No   WEIGHT BEARING RESTRICTIONS: No  FALLS:  Has patient fallen in last 6 months? No  LIVING ENVIRONMENT: Lives with: lives with their family Lives in: House/apartment Stairs: Yes: Internal: 21 steps; on right going up Has following equipment at home: Single point cane  OCCUPATION: retired   PLOF: Independent  PATIENT GOALS: reduce pain and prolong how long I can stand  NEXT MD VISIT: still needs to schedule  OBJECTIVE:  Note: Objective measures were completed at Evaluation unless otherwise noted.   PATIENT SURVEYS:  Modified Oswestry 21/50   COGNITION: Overall cognitive status: Within functional limits for tasks assessed     SENSATION: WFL  MUSCLE LENGTH: Hamstrings: Right -20 deg; Left -20 deg   POSTURE: rounded shoulders, forward head, flexed trunk , and weight shift right  PALPATION: No tenderness from palpation   Lumbar contraction pattern  L Multifidus:impaired  R Multifidus:impaired   LUMBAR ROM:   AROM eval  Flexion 70%  Extension 70%  Right lateral flexion 40%  Left lateral flexion 40%  Right rotation 40%  Left rotation 40%   (Blank rows = not tested)  ! Indicates pain with testing  LOWER  EXTREMITY ROM:     Passive  Right eval Left eval  Hip flexion 100 100  Hip extension [facility]  Hip abduction    Hip adduction    Hip internal rotation    Hip external rotation    Knee flexion    Knee extension    Ankle dorsiflexion    Ankle plantarflexion    Ankle inversion    Ankle eversion     (Blank rows = not tested)  ! Indicates pain with testing  LOWER EXTREMITY MMT:    MMT Right eval Left eval  Hip flexion 4- 4-  Hip extension 4- 4-  Hip abduction 4- 4-  Hip adduction    Hip internal rotation    Hip external rotation    Knee flexion    Knee extension    Ankle dorsiflexion    Ankle plantarflexion    Ankle inversion    Ankle eversion     (Blank rows = not tested)   ! Indicates pain with testing LUMBAR SPECIAL TESTS:  Prone instability test: Positive  FUNCTIONAL TESTS:  5 times sit to stand: 21.16s  GAIT: Distance walked: 45ft Assistive device utilized: None Level of assistance: SBA Comments: antalgic, low quality gait pattern with flexed R trunk lean.   [facility] Adult PT Treatment:                                                DATE: 08/14/23 Therapeutic Exercise: Nustep level 5 x 8 mins while gathering subjective and planning session with patient Therapeutic Activity: Bridge with core/pelvic floor brace  2x12, 3s hold Split stance lumbar rotation w/core bracing 2x8 UE assist, 10# KB Flexion rows with spring board 2x15, 73ft from board Standing marching 4# 2x30" single UE support Hip 3 ways x12, 4# cuff Palloff press 13# 2x10 BIL w/core bracing   [facility] Adult PT Treatment:                                                DATE: 08/12/2023  Therapeutic Exercise: NuStep 8'  Therapeutic Activity: Bridge with core/pelvic floor brace  2x12, 3s hold Split stance lumbar rotation w/core bracing 2x8 UE assist, 10# KB Flexion rows with spring board 2x15, 39ft from board Hip 3 ways 2x12, 4# cuff  [facility] Adult PT Treatment:                                                 DATE: 08/11/2023  Therapeutic Exercise: NuStep 5' Figure 4 on Pball 1x20 reps, 3s hold, both sides  Therapeutic Activity: Axe chops with Cables 2x12, 2s hold, 10lbs Standing marches with YTB 2x8, 3s hold Flexion rows with spring board 2x15, 25ft from board    PATIENT EDUCATION:  Education details: Pt received education regarding HEP performance, ADL performance, functional activity tolerance, impairment education, appropriate performance of therapeutic activities, Core activation patterns, anatomy. Person educated: Patient Education method: Explanation, Demonstration, Tactile cues, Verbal cues, and Handouts Education comprehension: verbalized understanding and returned demonstration  HOME EXERCISE PROGRAM: Access Code: [email or web address] URL: [email or web address] Date: 07/15/2023 Prepared by: Janyth Pupa  Perry  Exercises - Prone Hip Extension  - 1 x daily - 5 x weekly - 2 sets - 8 reps - 8s hold - Supine Single Knee to Chest Stretch  - 2 x daily - 5-7 x weekly - 2 sets - 1 reps - 51m hold - Supine Lower Trunk Rotation  - 1 x daily - 5 x weekly - 2-3 sets - 10 reps - 3s hold - Supine Bridge  - 1 x daily - 5 x weekly - 2-3 sets - 8 reps - 5s hold - Sit to Stand with Resistance Around Legs  - 1 x daily - 3-5 x weekly - 2 sets - 12 reps  ASSESSMENT:  CLINICAL IMPRESSION: Patient presents to PT reporting increase in lower back pain from previous session, stating he woke up in more pain today. Session today continued to focus on core strengthening and stabilization as well as proximal hip strengthening. He requires some cues for form during standing activities, particularly into trunk extension as he tends to flex with muscular fatigue. Patient was able to tolerate all prescribed exercises with no adverse effects. Patient continues to benefit from skilled PT services and should be progressed as able to improve functional independence.    Eval impression: Pt. attended  today's physical therapy session for evaluation of low back pain. Pt has complaints of intoleracne to standing and pain in the low back. Pt has notable deficits with core activation patterns, BLE strength, B hip mobility, Lumbar AROM, and posture in stance.  Pt would benefit from therapeutic focus on postural education, Lumbar ROM, core activation during functional tasks, and standing activity tolerance.  Treatment performed today focused on foundational exercises for core activation and HEP independence. Pt demonstrated great understanding of education provided. required minimal cues and stand by assistance during ambulationfor appropriate performance with today's activities. Pt requires the intervention of skilled outpatient physical therapy to address the aforementioned deficits and progress towards a functional level in line with therapeutic goals.    OBJECTIVE IMPAIRMENTS: Abnormal gait, decreased activity tolerance, decreased mobility, difficulty walking, decreased ROM, decreased strength, impaired flexibility, impaired tone, and pain.   ACTIVITY LIMITATIONS: carrying, lifting, bending, standing, squatting, continence, and locomotion level  PARTICIPATION LIMITATIONS: interpersonal relationship, driving, shopping, community activity, and yard work  PERSONAL FACTORS: Age, Fitness, and Past/current experiences are also affecting patient's functional outcome.   REHAB POTENTIAL: Fair personal factors  CLINICAL DECISION MAKING: Stable/uncomplicated  EVALUATION COMPLEXITY: Low   GOALS: Goals reviewed with patient? YES  SHORT TERM GOALS: Target date: 08/05/2023    Pt will be independent with administered HEP to demonstrate the competency necessary for long term managemnet of symptoms at home. Baseline: Goal status: INITIAL Pt will perform a 5 time STS in less than or equal to 16 seconds to demonstrate improved BLE strength and function for transfer tasks necessary for improving standing  tolerance. Baseline: 21s Goal status: INITIAL     LONG TERM GOALS: Target date: 08/26/2023    Pt. Will achieve a MODI score of 33/50 as to demonstrate improvement in self-perceived functional ability with daily activities.  Baseline: 21 Goal status: INITIAL  2.  Pt will improve Global hip strength to a 4+/5 to demonstrate improvement in strength for quality of motion and activity performance.  Baseline: 4- Goal status: INITIAL  3.  Pt will improve Lumbar AROM to 80% of standardized norms with less than 4/10 pain to demonstrate necessary mobility for high quality and safe ADLs  Baseline: 40% Goal status: INITIAL  4.  Pt will report the ability to stand for >/= 15 minutes as to demonstrate improved tolerance to standing for prolonged time and improved ability to participate in ADLs.  Baseline:  Goal status: INITIAL  PLAN:  PT FREQUENCY: 2x/week  PT DURATION: 6 weeks  PLANNED INTERVENTIONS: 97110-Therapeutic exercises, 97530- Therapeutic activity, [id number]- Neuromuscular re-education, 97535- Self Care, [id number]- Manual therapy, [phone]- Gait training, Patient/Family education, Stair training, Dry Needling, Joint mobilization, and Spinal mobilization.  PLAN FOR NEXT SESSION: Continue with therapeutic focus on dynamic core stability, Hip/lumbar mobility, standing tolerance, transfer quality, and global hip strengthening.  Berta Minor PTA  08/14/2023, 9:15 AM

## 2023-08-19 ENCOUNTER — Encounter: Payer: Self-pay | Admitting: Physical Therapy

## 2023-08-19 ENCOUNTER — Ambulatory Visit: Payer: Medicare Other | Admitting: Physical Therapy

## 2023-08-19 DIAGNOSIS — M5459 Other low back pain: Secondary | ICD-10-CM

## 2023-08-19 DIAGNOSIS — M6281 Muscle weakness (generalized): Secondary | ICD-10-CM

## 2023-08-19 DIAGNOSIS — R2689 Other abnormalities of gait and mobility: Secondary | ICD-10-CM | POA: Diagnosis not present

## 2023-08-19 NOTE — Therapy (Signed)
 OUTPATIENT PHYSICAL THERAPY TREATMENT NOTE   Patient Name: [name] [name] MRN: [id number] DOB:07/25/1950, 73 y.o., male Today's Date: 08/19/2023  END OF SESSION:  PT End of Session - 08/19/23 0832     Visit Number 6    Number of Visits 13    Date for PT Re-Evaluation 08/26/23    Authorization Type [employer] medicare    Authorization Time Period 12 visits approved 07/15/23-08/26/23    Authorization - Visit Number 5    Authorization - Number of Visits 12    PT Start Time 0832    PT Stop Time 0910    PT Time Calculation (min) 38 min    Activity Tolerance Patient tolerated treatment well    Behavior During Therapy [facility] for tasks assessed/performed              Past Medical History:  Diagnosis Date   Allergy    seasonal allergies   Arthritis    generalized   Blood transfusion without reported diagnosis 2013   when had diverticulitis flare   BPH (benign prostatic hypertrophy)    Colon polyp    diverticular bleed with transfusion   Diverticulosis    History of kidney stones    Hyperlipidemia    on meds   Hypertension    on meds   Kidney stones    hx of 15-20 kidney stones    Obesity, unspecified    Pre-diabetes    Prostate cancer (HCC) 2020   dx 05/2019   Prostate nodule 07/27/2008   Past Surgical History:  Procedure Laterality Date   ANKLE FUSION Left 1991   COLONOSCOPY  2011   DB-F/V-miralax(good)-TICS/3 HPP   CYSTOSCOPY W/ RETROGRADES Bilateral 08/02/2022   Procedure: CYSTOSCOPY WITH BILATERAL RETROGRADE PYELOGRAM;  Surgeon: Crista Elliot, MD;  Location: [facility];  Service: Urology;  Laterality: Bilateral;   HERNIA REPAIR N/A    umbilical    IR 3D INDEPENDENT WKST  04/07/2023   IR ANGIOGRAM PELVIS SELECTIVE OR SUPRASELECTIVE  04/07/2023   IR ANGIOGRAM SELECTIVE EACH ADDITIONAL VESSEL  04/07/2023   IR ANGIOGRAM SELECTIVE EACH ADDITIONAL VESSEL  04/07/2023   IR EMBO ARTERIAL NOT HEMORR HEMANG INC GUIDE ROADMAPPING  04/07/2023   IR RADIOLOGIST EVAL & MGMT   03/19/2023   IR RADIOLOGIST EVAL & MGMT  05/07/2023   IR US GUIDE VASC ACCESS RIGHT  04/07/2023   POLYPECTOMY  2011   3 HPP   TOTAL KNEE ARTHROPLASTY Right 03/15/2022   Procedure: TOTAL KNEE ARTHROPLASTY;  Surgeon: Frederico Hamman, MD;  Location: [facility];  Service: Orthopedics;  Laterality: Right;   TRANSURETHRAL RESECTION OF BLADDER TUMOR N/A 08/02/2022   Procedure: TRANSURETHRAL RESECTION OF BLADDER TUMOR (TURBT);  Surgeon: Crista Elliot, MD;  Location: [facility];  Service: Urology;  Laterality: N/A;  60 MINS   TRANSURETHRAL RESECTION OF PROSTATE  2020   Patient Active Problem List   Diagnosis Date Noted   Osteoarthritis of spine with radiculopathy, lumbar region 07/01/2023   Chronic bilateral low back pain with left-sided sciatica 07/01/2023   Chronic insomnia 05/01/2023   S/P total knee arthroplasty, right 03/15/2022   Body mass index (BMI) of 38.0-38.9 in adult 01/10/2020   Diverticulosis 01/10/2020   Prediabetes 01/10/2020   Dyslipidemia 01/10/2020   Malignant neoplasm of prostate (HCC) 06/08/2019   Prostate enlargement 08/29/2017   Gout 10/25/2014   Essential hypertension    Benign prostatic hyperplasia    Kidney stones    Prostate nodule     PCP: Alvy Bimler,  Eilleen Kempf, MD  REFERRING PROVIDER: Georgina Quint, MD  REFERRING DIAG:  [phone] (ICD-10-CM) - Osteoarthritis of spine with radiculopathy, lumbar region  M54.42,G89.29 (ICD-10-CM) - Chronic bilateral low back pain with left-sided sciatica    Rationale for Evaluation and Treatment: Rehabilitation  THERAPY DIAG:  Other low back pain  Muscle weakness (generalized)  Other abnormalities of gait and mobility  ONSET DATE: 7 months  SUBJECTIVE:                                                                                                                                                                                           SUBJECTIVE STATEMENT: Pt stated that pain overall is improving, sometimes still  hurts depending on the day/activity. Most discomfort after sitting/standing for a while.  Eval statement: Can't stand for long, maybe 5 minutes, just has been dealing with it for the past 7 months. Almost feels like a spasm on both L and R side, just oges across the back. No cepahlic travel. No caudal travel. No numbness or tingling. Just sharp spasms towards the lumbosacral area. Rest reliefs pain to 0/10, standing 9/10, hurts to stand to shower.   PERTINENT HISTORY:  Prostate surgery, Ankle fusion  PAIN:  Are you having pain? Yes: NPRS scale: 0/10 Pain location: lumbosacral Pain description: sharp spasms Aggravating factors: standing  Relieving factors: sitting   PRECAUTIONS: None  RED FLAGS: Bowel or bladder incontinence: Yes: following prostate surgery in feb, 2024 and Cauda equina syndrome: No   WEIGHT BEARING RESTRICTIONS: No  FALLS:  Has patient fallen in last 6 months? No  LIVING ENVIRONMENT: Lives with: lives with their family Lives in: House/apartment Stairs: Yes: Internal: 21 steps; on right going up Has following equipment at home: Single point cane  OCCUPATION: retired   PLOF: Independent  PATIENT GOALS: reduce pain and prolong how long I can stand  NEXT MD VISIT: still needs to schedule  OBJECTIVE:  Note: Objective measures were completed at Evaluation unless otherwise noted.   PATIENT SURVEYS:  Modified Oswestry 21/50   COGNITION: Overall cognitive status: Within functional limits for tasks assessed     SENSATION: WFL  MUSCLE LENGTH: Hamstrings: Right -20 deg; Left -20 deg   POSTURE: rounded shoulders, forward head, flexed trunk , and weight shift right  PALPATION: No tenderness from palpation   Lumbar contraction pattern  L Multifidus:impaired  R Multifidus:impaired   LUMBAR ROM:   AROM eval  Flexion 70%  Extension 70%  Right lateral flexion 40%  Left lateral flexion 40%  Right rotation 40%  Left rotation 40%   (Blank rows =  not tested)  ! Indicates  pain with testing  LOWER EXTREMITY ROM:     Passive  Right eval Left eval  Hip flexion 100 100  Hip extension [facility]  Hip abduction    Hip adduction    Hip internal rotation    Hip external rotation    Knee flexion    Knee extension    Ankle dorsiflexion    Ankle plantarflexion    Ankle inversion    Ankle eversion     (Blank rows = not tested)  ! Indicates pain with testing  LOWER EXTREMITY MMT:    MMT Right eval Left eval  Hip flexion 4- 4-  Hip extension 4- 4-  Hip abduction 4- 4-  Hip adduction    Hip internal rotation    Hip external rotation    Knee flexion    Knee extension    Ankle dorsiflexion    Ankle plantarflexion    Ankle inversion    Ankle eversion     (Blank rows = not tested)   ! Indicates pain with testing LUMBAR SPECIAL TESTS:  Prone instability test: Positive  FUNCTIONAL TESTS:  5 times sit to stand: 21.16s  GAIT: Distance walked: 73ft Assistive device utilized: None Level of assistance: SBA Comments: antalgic, low quality gait pattern with flexed R trunk lean.   [facility] Adult PT Treatment:                                                DATE: 08/19/23 Therapeutic Exercise: Nustep level 5' Figure 4 stretch w/knee ext using strap 4x10 ea. Hamstring stretch w/ strap 2x1' Therapeutic Activity: Bridge with core/pelvic floor brace and BTB 2x12, 3s hold Split stance lumbar rotation w/core bracing 2x8 UE assist, 10# KB Flexion rows with spring board 2x15, 51ft from board  [facility] Adult PT Treatment:                                                DATE: 08/14/23 Therapeutic Exercise: Nustep level 5 x 8 mins while gathering subjective and planning session with patient Therapeutic Activity: Bridge with core/pelvic floor brace  2x12, 3s hold Split stance lumbar rotation w/core bracing 2x8 UE assist, 10# KB Flexion rows with spring board 2x15, 51ft from board Standing marching 4# 2x30" single UE support Hip 3  ways x12, 4# cuff Palloff press 13# 2x10 BIL w/core bracing      PATIENT EDUCATION:  Education details: Pt received education regarding HEP performance, ADL performance, functional activity tolerance, impairment education, appropriate performance of therapeutic activities, Core activation patterns, anatomy. Person educated: Patient Education method: Explanation, Demonstration, Tactile cues, Verbal cues, and Handouts Education comprehension: verbalized understanding and returned demonstration  HOME EXERCISE PROGRAM: Access Code: [email or web address] URL: [email or web address] Date: 07/15/2023 Prepared by: Sheliah Plane  Exercises - Prone Hip Extension  - 1 x daily - 5 x weekly - 2 sets - 8 reps - 8s hold - Supine Single Knee to Chest Stretch  - 2 x daily - 5-7 x weekly - 2 sets - 1 reps - 66m hold - Supine Lower Trunk Rotation  - 1 x daily - 5 x weekly - 2-3 sets - 10 reps - 3s hold - Supine Bridge  - 1 x daily - 5 x  weekly - 2-3 sets - 8 reps - 5s hold - Sit to Stand with Resistance Around Legs  - 1 x daily - 3-5 x weekly - 2 sets - 12 reps  ASSESSMENT:  CLINICAL IMPRESSION: Pt attended physical therapy session for continuation of treatment regarding LBP. Today's treatment focused on improvement of  lumbar/hip motility and mobility as well as dynamic core stabilization. Pt showed  great tolerance to treatment and demonstrated improvement with pain levels, as well as lumbar/hip mobility by the end of session. Some difficulties continue with pain levels with prolonged sitting/standing between sessions. Pt required minimal cuing as well as no  assistance for safe and appropriate performance of today's activities. Continue with therapeutic focus on lumbar/hip mobility and motility, Global hip strength, dynamic core stability, and postural education for reducing joint stress between sessions.   Eval impression: Pt. attended today's physical therapy session for evaluation of low back  pain. Pt has complaints of intoleracne to standing and pain in the low back. Pt has notable deficits with core activation patterns, BLE strength, B hip mobility, Lumbar AROM, and posture in stance.  Pt would benefit from therapeutic focus on postural education, Lumbar ROM, core activation during functional tasks, and standing activity tolerance.  Treatment performed today focused on foundational exercises for core activation and HEP independence. Pt demonstrated great understanding of education provided. required minimal cues and stand by assistance during ambulationfor appropriate performance with today's activities. Pt requires the intervention of skilled outpatient physical therapy to address the aforementioned deficits and progress towards a functional level in line with therapeutic goals.    OBJECTIVE IMPAIRMENTS: Abnormal gait, decreased activity tolerance, decreased mobility, difficulty walking, decreased ROM, decreased strength, impaired flexibility, impaired tone, and pain.   ACTIVITY LIMITATIONS: carrying, lifting, bending, standing, squatting, continence, and locomotion level  PARTICIPATION LIMITATIONS: interpersonal relationship, driving, shopping, community activity, and yard work  PERSONAL FACTORS: Age, Fitness, and Past/current experiences are also affecting patient's functional outcome.   REHAB POTENTIAL: Fair personal factors  CLINICAL DECISION MAKING: Stable/uncomplicated  EVALUATION COMPLEXITY: Low   GOALS: Goals reviewed with patient? YES  SHORT TERM GOALS: Target date: 08/05/2023    Pt will be independent with administered HEP to demonstrate the competency necessary for long term managemnet of symptoms at home. Baseline: Goal status: INITIAL Pt will perform a 5 time STS in less than or equal to 16 seconds to demonstrate improved BLE strength and function for transfer tasks necessary for improving standing tolerance. Baseline: 21s Goal status: INITIAL     LONG  TERM GOALS: Target date: 08/26/2023    Pt. Will achieve a MODI score of 33/50 as to demonstrate improvement in self-perceived functional ability with daily activities.  Baseline: 21 Goal status: INITIAL  2.  Pt will improve Global hip strength to a 4+/5 to demonstrate improvement in strength for quality of motion and activity performance.  Baseline: 4- Goal status: INITIAL  3.  Pt will improve Lumbar AROM to 80% of standardized norms with less than 4/10 pain to demonstrate necessary mobility for high quality and safe ADLs  Baseline: 40% Goal status: INITIAL  4.  Pt will report the ability to stand for >/= 15 minutes as to demonstrate improved tolerance to standing for prolonged time and improved ability to participate in ADLs.  Baseline:  Goal status: INITIAL  PLAN:  PT FREQUENCY: 2x/week  PT DURATION: 6 weeks  PLANNED INTERVENTIONS: 97110-Therapeutic exercises, 97530- Therapeutic activity, [id number]- Neuromuscular re-education, 97535- Self Care, [id number]- Manual therapy, [phone]- Gait training, Patient/Family  education, Stair training, Dry Needling, Joint mobilization, and Spinal mobilization.  PLAN FOR NEXT SESSION: Continue with therapeutic focus on lumbar/hip mobility and motility, Global hip strength, dynamic core stability, and postural education for reducing joint stress between sessions.   Sheliah Plane, PT, DPT 08/19/2023, 9:43 AM

## 2023-08-21 ENCOUNTER — Ambulatory Visit: Payer: Medicare Other | Admitting: Physical Therapy

## 2023-08-21 ENCOUNTER — Encounter: Payer: Self-pay | Admitting: Physical Therapy

## 2023-08-21 DIAGNOSIS — R2689 Other abnormalities of gait and mobility: Secondary | ICD-10-CM | POA: Diagnosis not present

## 2023-08-21 DIAGNOSIS — M5459 Other low back pain: Secondary | ICD-10-CM

## 2023-08-21 DIAGNOSIS — M6281 Muscle weakness (generalized): Secondary | ICD-10-CM

## 2023-08-21 NOTE — Therapy (Signed)
 OUTPATIENT PHYSICAL THERAPY TREATMENT NOTE   Patient Name: [name] [name] MRN: [id number] DOB:09/30/1950, 73 y.o., male Today's Date: 08/21/2023  END OF SESSION:  PT End of Session - 08/21/23 0916     Visit Number 7    Number of Visits 13    Date for PT Re-Evaluation 08/26/23    Authorization - Visit Number 6    Authorization - Number of Visits 12    PT Start Time 0912    PT Stop Time 0935    PT Time Calculation (min) 23 min    Activity Tolerance Patient tolerated treatment well    Behavior During Therapy WFL for tasks assessed/performed             PHYSICAL THERAPY DISCHARGE SUMMARY  Visits from Start of Care: 7  Current functional level related to goals / functional outcomes: All goals met   Remaining deficits: None related to POC   Education / Equipment: See assessment   Patient agrees to discharge. Patient goals were met. Patient is being discharged due to meeting the stated rehab goals.     Past Medical History:  Diagnosis Date   Allergy    seasonal allergies   Arthritis    generalized   Blood transfusion without reported diagnosis 2013   when had diverticulitis flare   BPH (benign prostatic hypertrophy)    Colon polyp    diverticular bleed with transfusion   Diverticulosis    History of kidney stones    Hyperlipidemia    on meds   Hypertension    on meds   Kidney stones    hx of 15-20 kidney stones    Obesity, unspecified    Pre-diabetes    Prostate cancer (HCC) 2020   dx 05/2019   Prostate nodule 07/27/2008   Past Surgical History:  Procedure Laterality Date   ANKLE FUSION Left 1991   COLONOSCOPY  2011   DB-F/V-miralax(good)-TICS/3 HPP   CYSTOSCOPY W/ RETROGRADES Bilateral 08/02/2022   Procedure: CYSTOSCOPY WITH BILATERAL RETROGRADE PYELOGRAM;  Surgeon: Crista Elliot, MD;  Location: [facility];  Service: Urology;  Laterality: Bilateral;   HERNIA REPAIR N/A    umbilical    IR 3D INDEPENDENT WKST  04/07/2023   IR ANGIOGRAM PELVIS  SELECTIVE OR SUPRASELECTIVE  04/07/2023   IR ANGIOGRAM SELECTIVE EACH ADDITIONAL VESSEL  04/07/2023   IR ANGIOGRAM SELECTIVE EACH ADDITIONAL VESSEL  04/07/2023   IR EMBO ARTERIAL NOT HEMORR HEMANG INC GUIDE ROADMAPPING  04/07/2023   IR RADIOLOGIST EVAL & MGMT  03/19/2023   IR RADIOLOGIST EVAL & MGMT  05/07/2023   IR US GUIDE VASC ACCESS RIGHT  04/07/2023   POLYPECTOMY  2011   3 HPP   TOTAL KNEE ARTHROPLASTY Right 03/15/2022   Procedure: TOTAL KNEE ARTHROPLASTY;  Surgeon: Frederico Hamman, MD;  Location: [facility];  Service: Orthopedics;  Laterality: Right;   TRANSURETHRAL RESECTION OF BLADDER TUMOR N/A 08/02/2022   Procedure: TRANSURETHRAL RESECTION OF BLADDER TUMOR (TURBT);  Surgeon: Crista Elliot, MD;  Location: [facility];  Service: Urology;  Laterality: N/A;  60 MINS   TRANSURETHRAL RESECTION OF PROSTATE  2020   Patient Active Problem List   Diagnosis Date Noted   Osteoarthritis of spine with radiculopathy, lumbar region 07/01/2023   Chronic bilateral low back pain with left-sided sciatica 07/01/2023   Chronic insomnia 05/01/2023   S/P total knee arthroplasty, right 03/15/2022   Body mass index (BMI) of 38.0-38.9 in adult 01/10/2020   Diverticulosis 01/10/2020   Prediabetes 01/10/2020  Dyslipidemia 01/10/2020   Malignant neoplasm of prostate (HCC) 06/08/2019   Prostate enlargement 08/29/2017   Gout 10/25/2014   Essential hypertension    Benign prostatic hyperplasia    Kidney stones    Prostate nodule     PCP: Georgina Quint, MD  REFERRING PROVIDER: Georgina Quint, MD  REFERRING DIAG:  [phone] (ICD-10-CM) - Osteoarthritis of spine with radiculopathy, lumbar region  M54.42,G89.29 (ICD-10-CM) - Chronic bilateral low back pain with left-sided sciatica    Rationale for Evaluation and Treatment: Rehabilitation  THERAPY DIAG:  Other low back pain  Muscle weakness (generalized)  Other abnormalities of gait and mobility  ONSET DATE: 7 months  SUBJECTIVE:                                                                                                                                                                                            SUBJECTIVE STATEMENT: Pt stated that he's feeling really good today, everything at home is feeling much easier and he has noticed a huge difference in general functional ability.  Eval statement: Can't stand for long, maybe 5 minutes, just has been dealing with it for the past 7 months. Almost feels like a spasm on both L and R side, just oges across the back. No cepahlic travel. No caudal travel. No numbness or tingling. Just sharp spasms towards the lumbosacral area. Rest reliefs pain to 0/10, standing 9/10, hurts to stand to shower.   PERTINENT HISTORY:  Prostate surgery, Ankle fusion  PAIN:  Are you having pain? Yes: NPRS scale: 0/10 Pain location: lumbosacral Pain description: sharp spasms Aggravating factors: standing  Relieving factors: sitting   PRECAUTIONS: None  RED FLAGS: Bowel or bladder incontinence: Yes: following prostate surgery in feb, 2024 and Cauda equina syndrome: No   WEIGHT BEARING RESTRICTIONS: No  FALLS:  Has patient fallen in last 6 months? No  LIVING ENVIRONMENT: Lives with: lives with their family Lives in: House/apartment Stairs: Yes: Internal: 21 steps; on right going up Has following equipment at home: Single point cane  OCCUPATION: retired   PLOF: Independent  PATIENT GOALS: reduce pain and prolong how long I can stand  NEXT MD VISIT: still needs to schedule  OBJECTIVE:  Note: Objective measures were completed at Evaluation unless otherwise noted.   PATIENT SURVEYS:  Modified Oswestry 21/50  08/21/2023: 8/50 (16%)  COGNITION: Overall cognitive status: Within functional limits for tasks assessed     SENSATION: WFL  MUSCLE LENGTH: Hamstrings: Right -20 deg; Left -20 deg   POSTURE: rounded shoulders, forward head, flexed trunk , and weight shift  right  PALPATION: No tenderness from palpation  Lumbar contraction pattern  L Multifidus:impaired  R Multifidus:impaired   LUMBAR ROM:   AROM eval 08/21/2023  Flexion 70% 90%  Extension 70% 100%  Right lateral flexion 40% 90%  Left lateral flexion 40% 90%  Right rotation 40% 80%  Left rotation 40% 80%   (Blank rows = not tested)  ! Indicates pain with testing  LOWER EXTREMITY ROM:     Passive  Right eval Left eval  Hip flexion 100 100  Hip extension [facility]  Hip abduction    Hip adduction    Hip internal rotation    Hip external rotation    Knee flexion    Knee extension    Ankle dorsiflexion    Ankle plantarflexion    Ankle inversion    Ankle eversion     (Blank rows = not tested)  ! Indicates pain with testing  LOWER EXTREMITY MMT:    MMT Right eval 3/13/205 Left eval   Hip flexion 4- 5+ 4- 4+  Hip extension 4- 5 4- 5  Hip abduction 4- 4+ 4- 4+  Hip adduction      Hip internal rotation      Hip external rotation      Knee flexion  5  5  Knee extension  5  5  Ankle dorsiflexion      Ankle plantarflexion      Ankle inversion      Ankle eversion       (Blank rows = not tested)   ! Indicates pain with testing LUMBAR SPECIAL TESTS:  Prone instability test: Positive  FUNCTIONAL TESTS:  5 times sit to stand: 21.16s  GAIT: Distance walked: 95ft Assistive device utilized: None Level of assistance: SBA Comments: antalgic, low quality gait pattern with flexed R trunk lean.    [facility] Adult PT Treatment:                                                DATE: 08/21/2023  Therapeutic Activity: Re-evaluative measures Self Care: POC discussion Pt education   [facility] Adult PT Treatment:                                                DATE: 08/19/23 Therapeutic Exercise: Nustep level 5' Figure 4 stretch w/knee ext using strap 4x10 ea. Hamstring stretch w/ strap 2x1' Therapeutic Activity: Bridge with core/pelvic floor brace and BTB 2x12, 3s hold Split  stance lumbar rotation w/core bracing 2x8 UE assist, 10# KB Flexion rows with spring board 2x15, 20ft from board  PATIENT EDUCATION:  Education details: Pt received education regarding HEP performance, ADL performance, functional activity tolerance, impairment education, appropriate performance of therapeutic activities, Core activation patterns, anatomy. Person educated: Patient Education method: Explanation, Demonstration, Tactile cues, Verbal cues, and Handouts Education comprehension: verbalized understanding and returned demonstration  HOME EXERCISE PROGRAM: Access Code: [email or web address] URL: [email or web address] Date: 07/15/2023 Prepared by: Sheliah Plane  Exercises - Prone Hip Extension  - 1 x daily - 5 x weekly - 2 sets - 8 reps - 8s hold - Supine Single Knee to Chest Stretch  - 2 x daily - 5-7 x weekly - 2 sets - 1 reps - 28m hold - Supine Lower Trunk Rotation  - 1  x daily - 5 x weekly - 2-3 sets - 10 reps - 3s hold - Supine Bridge  - 1 x daily - 5 x weekly - 2-3 sets - 8 reps - 5s hold - Sit to Stand with Resistance Around Legs  - 1 x daily - 3-5 x weekly - 2 sets - 12 reps  ASSESSMENT:  CLINICAL IMPRESSION: Pt attended physical therapy session for re-evaluation of LBP. Pt has made significant progress in all areas of current POC. Pt has met all therapeutic goals and is content with functional progress.  Pt was educated to continue with HEP at home, consider joining a gym or social activity group such as swimming or fitness classes.Pt required no verbal/tactile cuing as well as no physical assistance for safe and appropriate performance of today's activities. Pt is to be discharged at the completion of session d/t meeting stated rehab goals.  Eval impression: Pt. attended today's physical therapy session for evaluation of low back pain. Pt has complaints of intoleracne to standing and pain in the low back. Pt has notable deficits with core activation patterns, BLE  strength, B hip mobility, Lumbar AROM, and posture in stance.  Pt would benefit from therapeutic focus on postural education, Lumbar ROM, core activation during functional tasks, and standing activity tolerance.  Treatment performed today focused on foundational exercises for core activation and HEP independence. Pt demonstrated great understanding of education provided. required minimal cues and stand by assistance during ambulationfor appropriate performance with today's activities. Pt requires the intervention of skilled outpatient physical therapy to address the aforementioned deficits and progress towards a functional level in line with therapeutic goals.    OBJECTIVE IMPAIRMENTS: Abnormal gait, decreased activity tolerance, decreased mobility, difficulty walking, decreased ROM, decreased strength, impaired flexibility, impaired tone, and pain.   ACTIVITY LIMITATIONS: carrying, lifting, bending, standing, squatting, continence, and locomotion level  PARTICIPATION LIMITATIONS: interpersonal relationship, driving, shopping, community activity, and yard work  PERSONAL FACTORS: Age, Fitness, and Past/current experiences are also affecting patient's functional outcome.   REHAB POTENTIAL: Fair personal factors  CLINICAL DECISION MAKING: Stable/uncomplicated  EVALUATION COMPLEXITY: Low   GOALS: Goals reviewed with patient? YES  SHORT TERM GOALS: Target date: 08/05/2023    Pt will be independent with administered HEP to demonstrate the competency necessary for long term managemnet of symptoms at home. Baseline: Goal status: MET 08/21/2023  Pt will perform a 5 time STS in less than or equal to 16 seconds to demonstrate improved BLE strength and function for transfer tasks necessary for improving standing tolerance. Baseline: 21s 08/21/2023: 15.25s Goal status: MET 08/21/2023     LONG TERM GOALS: Target date: 08/26/2023   Pt. Will achieve a MODI score of 33/50 as to demonstrate  improvement in self-perceived functional ability with daily activities.  Baseline: 21 Goal status: IMET 08/21/2023  2.  Pt will improve Global hip strength to a 4+/5 to demonstrate improvement in strength for quality of motion and activity performance.  Baseline: see obj chart Goal status: MET 08/21/2023  3.  Pt will improve Lumbar AROM to 80% of standardized norms with less than 4/10 pain to demonstrate necessary mobility for high quality and safe ADLs  Baseline: 40% Goal status: MET 08/21/2023  4.  Pt will report the ability to stand for >/= 15 minutes as to demonstrate improved tolerance to standing for prolonged time and improved ability to participate in ADLs.  Baseline:  Goal status: MET 08/21/2023  PLAN:  PT FREQUENCY: 2x/week  PT DURATION: 6 weeks  PLANNED INTERVENTIONS: 97110-Therapeutic exercises, 97530- Therapeutic activity, [id number]- Neuromuscular re-education, [phone]- Self Care, [id number]- Manual therapy, [phone]- Gait training, Patient/Family education, Stair training, Dry Needling, Joint mobilization, and Spinal mobilization.  PLAN FOR NEXT SESSION: d/c.   Sheliah Plane, PT, DPT 08/21/2023, 9:38 AM

## 2023-09-16 ENCOUNTER — Other Ambulatory Visit: Payer: Self-pay | Admitting: Interventional Radiology

## 2023-09-16 DIAGNOSIS — N401 Enlarged prostate with lower urinary tract symptoms: Secondary | ICD-10-CM

## 2023-09-29 ENCOUNTER — Ambulatory Visit (HOSPITAL_COMMUNITY)
Admission: RE | Admit: 2023-09-29 | Discharge: 2023-09-29 | Disposition: A | Discharge status: Home / Self Care | Source: Ambulatory Visit | Attending: Interventional Radiology | Admitting: Interventional Radiology

## 2023-09-29 DIAGNOSIS — K5730 Diverticulosis of large intestine without perforation or abscess without bleeding: Secondary | ICD-10-CM | POA: Diagnosis not present

## 2023-09-29 DIAGNOSIS — N401 Enlarged prostate with lower urinary tract symptoms: Secondary | ICD-10-CM | POA: Insufficient documentation

## 2023-09-29 MED ORDER — GADOBUTROL 1 MMOL/ML IV SOLN
10.0000 mL | Freq: Once | INTRAVENOUS | Status: AC | PRN
Start: 2023-09-29 — End: 2023-09-29
  Administered 2023-09-29: 10 mL via INTRAVENOUS
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
## 2023-10-06 ENCOUNTER — Ambulatory Visit
Admission: RE | Admit: 2023-10-06 | Discharge: 2023-10-06 | Disposition: A | Discharge status: Home / Self Care | Source: Ambulatory Visit | Attending: Interventional Radiology | Admitting: Interventional Radiology

## 2023-10-06 DIAGNOSIS — N401 Enlarged prostate with lower urinary tract symptoms: Secondary | ICD-10-CM

## 2023-10-06 HISTORY — PX: IR RADIOLOGIST EVAL & MGMT: IMG5224

## 2023-10-23 DIAGNOSIS — R3915 Urgency of urination: Secondary | ICD-10-CM | POA: Diagnosis not present

## 2023-10-23 DIAGNOSIS — N3000 Acute cystitis without hematuria: Secondary | ICD-10-CM | POA: Diagnosis not present

## 2023-10-23 DIAGNOSIS — N3041 Irradiation cystitis with hematuria: Secondary | ICD-10-CM | POA: Diagnosis not present

## 2023-10-29 ENCOUNTER — Ambulatory Visit (INDEPENDENT_AMBULATORY_CARE_PROVIDER_SITE_OTHER): Payer: Medicare Other | Admitting: Emergency Medicine

## 2023-10-29 ENCOUNTER — Ambulatory Visit: Payer: Self-pay | Admitting: Emergency Medicine

## 2023-10-29 ENCOUNTER — Encounter: Payer: Self-pay | Admitting: Emergency Medicine

## 2023-10-29 VITALS — BP 122/82 | HR 69 | Temp 97.7°F | Ht 74.0 in | Wt 316.0 lb

## 2023-10-29 DIAGNOSIS — R7303 Prediabetes: Secondary | ICD-10-CM | POA: Diagnosis not present

## 2023-10-29 DIAGNOSIS — E785 Hyperlipidemia, unspecified: Secondary | ICD-10-CM

## 2023-10-29 DIAGNOSIS — N400 Enlarged prostate without lower urinary tract symptoms: Secondary | ICD-10-CM

## 2023-10-29 DIAGNOSIS — I10 Essential (primary) hypertension: Secondary | ICD-10-CM | POA: Diagnosis not present

## 2023-10-29 DIAGNOSIS — C61 Malignant neoplasm of prostate: Secondary | ICD-10-CM

## 2023-10-29 DIAGNOSIS — L819 Disorder of pigmentation, unspecified: Secondary | ICD-10-CM

## 2023-10-29 LAB — COMPREHENSIVE METABOLIC PANEL WITH GFR
ALT: 14 U/L (ref 0–53)
AST: 16 U/L (ref 0–37)
Albumin: 4.3 g/dL (ref 3.5–5.2)
Alkaline Phosphatase: 55 U/L (ref 39–117)
BUN: 22 mg/dL (ref 6–23)
CO2: 27 meq/L (ref 19–32)
Calcium: 9.7 mg/dL (ref 8.4–10.5)
Chloride: 106 meq/L (ref 96–112)
Creatinine, Ser: 1.21 mg/dL (ref 0.40–1.50)
GFR: 60.34 mL/min (ref >60.00)
Glucose, Bld: 106 mg/dL — ABNORMAL HIGH (ref 70–99)
Potassium: 4 meq/L (ref 3.5–5.1)
Sodium: 140 meq/L (ref 135–145)
Total Bilirubin: 0.4 mg/dL (ref 0.2–1.2)
Total Protein: 7.5 g/dL (ref 6.0–8.3)

## 2023-10-29 LAB — HEMOGLOBIN A1C: Hgb A1c MFr Bld: 6.6 % — ABNORMAL HIGH (ref 4.6–6.5)

## 2023-10-29 LAB — LIPID PANEL
Cholesterol: 115 mg/dL (ref 0–200)
HDL: 36.7 mg/dL — ABNORMAL LOW (ref >39.00)
LDL Cholesterol: 54 mg/dL (ref 0–99)
NonHDL: 77.81
Total CHOL/HDL Ratio: 3
Triglycerides: 121 mg/dL (ref 0.0–149.0)
VLDL: 24.2 mg/dL (ref 0.0–40.0)

## 2023-10-29 LAB — CBC WITH DIFFERENTIAL/PLATELET
Basophils Absolute: 0 10*3/uL (ref 0.0–0.1)
Basophils Relative: 0.5 % (ref 0.0–3.0)
Eosinophils Absolute: 0.2 10*3/uL (ref 0.0–0.7)
Eosinophils Relative: 4.1 % (ref 0.0–5.0)
HCT: 36.6 % — ABNORMAL LOW (ref 39.0–52.0)
Hemoglobin: 11.9 g/dL — ABNORMAL LOW (ref 13.0–17.0)
Lymphocytes Relative: 30.9 % (ref 12.0–46.0)
Lymphs Abs: 1.4 10*3/uL (ref 0.7–4.0)
MCHC: 32.6 g/dL (ref 30.0–36.0)
MCV: 80.8 fl (ref 78.0–100.0)
Monocytes Absolute: 0.4 10*3/uL (ref 0.1–1.0)
Monocytes Relative: 8.1 % (ref 3.0–12.0)
Neutro Abs: 2.5 10*3/uL (ref 1.4–7.7)
Neutrophils Relative %: 56.4 % (ref 43.0–77.0)
Platelets: 150 10*3/uL (ref 150.0–400.0)
RBC: 4.53 Mil/uL (ref 4.22–5.81)
RDW: 17.1 % — ABNORMAL HIGH (ref 11.5–15.5)
WBC: 4.5 10*3/uL (ref 4.0–10.5)

## 2023-10-29 NOTE — Assessment & Plan Note (Signed)
 No evidence of metastatic bone disease. Sees oncologist on a regular basis. Treatment going well.  No complications so far.  Tolerating it well.

## 2023-10-29 NOTE — Assessment & Plan Note (Signed)
 Well-controlled lower urinary tract symptoms Continues tamsulosin  0.8 milligrams at bedtime

## 2023-10-29 NOTE — Assessment & Plan Note (Signed)
 BP Readings from Last 3 Encounters:  10/29/23 122/82  07/01/23 118/70  05/01/23 118/70  Well-controlled hypertension Continues Zestoretic  20-12.5 mg once a day Cardiovascular risks associated with hypertension discussed

## 2023-10-29 NOTE — Patient Instructions (Signed)
 Health Maintenance After Age 71 After age 4, you are at a higher risk for certain long-term diseases and infections as well as injuries from falls. Falls are a major cause of broken bones and head injuries in people who are older than age 47. Getting regular preventive care can help to keep you healthy and well. Preventive care includes getting regular testing and making lifestyle changes as recommended by your health care provider. Talk with your health care provider about: Which screenings and tests you should have. A screening is a test that checks for a disease when you have no symptoms. A diet and exercise plan that is right for you. What should I know about screenings and tests to prevent falls? Screening and testing are the best ways to find a health problem early. Early diagnosis and treatment give you the best chance of managing medical conditions that are common after age 37. Certain conditions and lifestyle choices may make you more likely to have a fall. Your health care provider may recommend: Regular vision checks. Poor vision and conditions such as cataracts can make you more likely to have a fall. If you wear glasses, make sure to get your prescription updated if your vision changes. Medicine review. Work with your health care provider to regularly review all of the medicines you are taking, including over-the-counter medicines. Ask your health care provider about any side effects that may make you more likely to have a fall. Tell your health care provider if any medicines that you take make you feel dizzy or sleepy. Strength and balance checks. Your health care provider may recommend certain tests to check your strength and balance while standing, walking, or changing positions. Foot health exam. Foot pain and numbness, as well as not wearing proper footwear, can make you more likely to have a fall. Screenings, including: Osteoporosis screening. Osteoporosis is a condition that causes  the bones to get weaker and break more easily. Blood pressure screening. Blood pressure changes and medicines to control blood pressure can make you feel dizzy. Depression screening. You may be more likely to have a fall if you have a fear of falling, feel depressed, or feel unable to do activities that you used to do. Alcohol use screening. Using too much alcohol can affect your balance and may make you more likely to have a fall. Follow these instructions at home: Lifestyle Do not drink alcohol if: Your health care provider tells you not to drink. If you drink alcohol: Limit how much you have to: 0-1 drink a day for women. 0-2 drinks a day for men. Know how much alcohol is in your drink. In the [place] one drink equals one 12 oz bottle of beer (355 mL), one 5 oz glass of wine (148 mL), or one 1 oz glass of hard liquor (44 mL). Do not use any products that contain nicotine or tobacco. These products include cigarettes, chewing tobacco, and vaping devices, such as e-cigarettes. If you need help quitting, ask your health care provider. Activity  Follow a regular exercise program to stay fit. This will help you maintain your balance. Ask your health care provider what types of exercise are appropriate for you. If you need a cane or walker, use it as recommended by your health care provider. Wear supportive shoes that have nonskid soles. Safety  Remove any tripping hazards, such as rugs, cords, and clutter. Install safety equipment such as grab bars in bathrooms and safety rails on stairs. Keep rooms and walkways  well-lit. General instructions Talk with your health care provider about your risks for falling. Tell your health care provider if: You fall. Be sure to tell your health care provider about all falls, even ones that seem minor. You feel dizzy, tiredness (fatigue), or off-balance. Take over-the-counter and prescription medicines only as told by your health care provider. These include  supplements. Eat a healthy diet and maintain a healthy weight. A healthy diet includes low-fat dairy products, low-fat (lean) meats, and fiber from whole grains, beans, and lots of fruits and vegetables. Stay current with your vaccines. Schedule regular health, dental, and eye exams. Summary Having a healthy lifestyle and getting preventive care can help to protect your health and wellness after age 11. Screening and testing are the best way to find a health problem early and help you avoid having a fall. Early diagnosis and treatment give you the best chance for managing medical conditions that are more common for people who are older than age 28. Falls are a major cause of broken bones and head injuries in people who are older than age 48. Take precautions to prevent a fall at home. Work with your health care provider to learn what changes you can make to improve your health and wellness and to prevent falls. This information is not intended to replace advice given to you by your health care provider. Make sure you discuss any questions you have with your health care provider. Document Revised: 10/16/2020 Document Reviewed: 10/16/2020 Elsevier Patient Education  2024 [employer].

## 2023-10-29 NOTE — Assessment & Plan Note (Signed)
Diet and nutrition discussed.  Advised to decrease amount of daily carbohydrate intake and daily calories and increase amount of plant based protein in his diet. Hemoglobin A1c done today.

## 2023-10-29 NOTE — Progress Notes (Signed)
 [name] [name] 73 y.o.   Chief Complaint  Patient presents with   Follow-up    6 month f/u for HTN. No other concerns     HISTORY OF PRESENT ILLNESS: This is a 73 y.o. male here for 51-month follow-up of chronic medical conditions including hypertension, dyslipidemia, and prediabetes History of prostate cancer.  Doing well.  Lower urinary tract symptoms much improved. Complaining of a dark skin lesion to mid upper back No other complaints or medical concerns today.  BP Readings from Last 3 Encounters:  10/29/23 122/82  07/01/23 118/70  05/01/23 118/70     HPI   Prior to Admission medications   Medication Sig Start Date End Date Taking? Authorizing Provider  ALPRAZolam  (XANAX ) 0.5 MG tablet TAKE 1 TABLET BY MOUTH EVERY DAY AS NEEDED FOR ANXIETY 03/31/23  Yes Cord Wilczynski, Isidro Margo, MD  lisinopril -hydrochlorothiazide  (ZESTORETIC ) 20-12.5 MG tablet TAKE 1 TABLET BY MOUTH EVERY DAY Patient taking differently: Take 1 tablet by mouth daily. 11/16/22  Yes Murad Staples, Isidro Margo, MD  meloxicam (MOBIC) 15 MG tablet Take 15 mg by mouth as needed for pain. 04/07/23  Yes [provider]  rosuvastatin  (CRESTOR ) 10 MG tablet TAKE 1 TABLET BY MOUTH EVERY DAY 02/14/23  Yes Magenta Schmiesing, Isidro Margo, MD  tamsulosin  (FLOMAX ) 0.4 MG CAPS capsule Take 0.8 mg by mouth daily. 01/03/23  Yes [provider]  zolpidem  (AMBIEN ) 5 MG tablet Take 1 tablet (5 mg total) by mouth at bedtime as needed for sleep. 05/01/23  Yes Shea Swalley, Isidro Margo, MD    Allergies  Allergen Reactions   Aspirin      REACTION: upsets stomach   Statins     REACTION: UPSETS STOMACH   Gadolinium Derivatives Nausea And Vomiting    Pt was given 20 ml multihance  and became nauseated and vomited several times.     Patient Active Problem List   Diagnosis Date Noted   Osteoarthritis of spine with radiculopathy, lumbar region 07/01/2023   Chronic bilateral low back pain with left-sided sciatica 07/01/2023   Chronic  insomnia 05/01/2023   S/P total knee arthroplasty, right 03/15/2022   Body mass index (BMI) of 38.0-38.9 in adult 01/10/2020   Diverticulosis 01/10/2020   Prediabetes 01/10/2020   Dyslipidemia 01/10/2020   Malignant neoplasm of prostate (HCC) 06/08/2019   Prostate enlargement 08/29/2017   Gout 10/25/2014   Essential hypertension    Benign prostatic hyperplasia    Kidney stones    Prostate nodule     Past Medical History:  Diagnosis Date   Allergy    seasonal allergies   Arthritis    generalized   Blood transfusion without reported diagnosis 2013   when had diverticulitis flare   BPH (benign prostatic hypertrophy)    Colon polyp    diverticular bleed with transfusion   Diverticulosis    History of kidney stones    Hyperlipidemia    on meds   Hypertension    on meds   Kidney stones    hx of 15-20 kidney stones    Obesity, unspecified    Pre-diabetes    Prostate cancer (HCC) 2020   dx 05/2019   Prostate nodule 07/27/2008    Past Surgical History:  Procedure Laterality Date   ANKLE FUSION Left 1991   COLONOSCOPY  2011   DB-F/V-miralax (good)-TICS/3 HPP   CYSTOSCOPY W/ RETROGRADES Bilateral 08/02/2022   Procedure: CYSTOSCOPY WITH BILATERAL RETROGRADE PYELOGRAM;  Surgeon: Samson Croak, MD;  Location: [facility];  Service: Urology;  Laterality: Bilateral;  HERNIA REPAIR N/A    umbilical    IR 3D INDEPENDENT WKST  04/07/2023   IR ANGIOGRAM PELVIS SELECTIVE OR SUPRASELECTIVE  04/07/2023   IR ANGIOGRAM SELECTIVE EACH ADDITIONAL VESSEL  04/07/2023   IR ANGIOGRAM SELECTIVE EACH ADDITIONAL VESSEL  04/07/2023   IR EMBO ARTERIAL NOT HEMORR HEMANG INC GUIDE ROADMAPPING  04/07/2023   IR RADIOLOGIST EVAL & MGMT  03/19/2023   IR RADIOLOGIST EVAL & MGMT  05/07/2023   IR RADIOLOGIST EVAL & MGMT  10/06/2023   IR US  GUIDE VASC ACCESS RIGHT  04/07/2023   POLYPECTOMY  2011   3 HPP   TOTAL KNEE ARTHROPLASTY Right 03/15/2022   Procedure: TOTAL KNEE ARTHROPLASTY;  Surgeon: Marlena Sima, MD;  Location: [facility];  Service: Orthopedics;  Laterality: Right;   TRANSURETHRAL RESECTION OF BLADDER TUMOR N/A 08/02/2022   Procedure: TRANSURETHRAL RESECTION OF BLADDER TUMOR (TURBT);  Surgeon: Samson Croak, MD;  Location: [facility];  Service: Urology;  Laterality: N/A;  60 MINS   TRANSURETHRAL RESECTION OF PROSTATE  2020    Social History   Socioeconomic History   Marital status: Married    Spouse name: [name]   Number of children: 2   Years of education: Not on file   Highest education level: Not on file  Occupational History   Occupation: Retired  Tobacco Use   Smoking status: Never   Smokeless tobacco: Never  Vaping Use   Vaping status: Never Used  Substance and Sexual Activity   Alcohol use: No   Drug use: No   Sexual activity: Yes  Other Topics Concern   Not on file  Social History Narrative   Lives with wife.   Social Drivers of Health   Financial Resource Strain: Medium Risk (03/06/2023)   Overall Financial Resource Strain (CARDIA)    Difficulty of Paying Living Expenses: Somewhat hard  Food Insecurity: No Food Insecurity (03/06/2023)   Hunger Vital Sign    Worried About Running Out of Food in the Last Year: Never true    Ran Out of Food in the Last Year: Never true  Transportation Needs: No Transportation Needs (03/06/2023)   PRAPARE - Administrator, Civil Service (Medical): No    Lack of Transportation (Non-Medical): No  Physical Activity: Inactive (03/06/2023)   Exercise Vital Sign    Days of Exercise per Week: 0 days    Minutes of Exercise per Session: 0 min  Stress: No Stress Concern Present (03/06/2023)   [employer] of Occupational Health - Occupational Stress Questionnaire    Feeling of Stress : Not at all  Social Connections: Moderately Integrated (03/06/2023)   Social Connection and Isolation Panel [NHANES]    Frequency of Communication with Friends and Family: More than three times a week    Frequency of Social  Gatherings with Friends and Family: Once a week    Attends Religious Services: More than 4 times per year    Active Member of [employer] or Organizations: No    Attends Banker Meetings: Never    Marital Status: Married  Catering manager Violence: Not At Risk (03/06/2023)   Humiliation, Afraid, Rape, and Kick questionnaire    Fear of Current or Ex-Partner: No    Emotionally Abused: No    Physically Abused: No    Sexually Abused: No    Family History  Problem Relation Age of Onset   Diabetes Mother    Breast cancer Mother 2   Prostate cancer Father  patient did not have a relationship with his father but understands he had prostate ca   Colon polyps Father 60   Colon cancer Father 70   Diabetes Sister    Pancreatic cancer Neg Hx    Esophageal cancer Neg Hx    Stomach cancer Neg Hx    Rectal cancer Neg Hx      Review of Systems  Constitutional: Negative.  Negative for chills and fever.  HENT: Negative.  Negative for congestion and sore throat.   Respiratory: Negative.  Negative for cough and shortness of breath.   Cardiovascular: Negative.  Negative for chest pain and palpitations.  Gastrointestinal:  Negative for abdominal pain, diarrhea, nausea and vomiting.  Genitourinary: Negative.  Negative for dysuria and hematuria.  Skin:  Negative for rash.  Neurological: Negative.  Negative for dizziness and headaches.  All other systems reviewed and are negative.   Vitals:   10/29/23 0835  BP: 122/82  Pulse: 69  Temp: 97.7 F (36.5 C)  SpO2: 96%    Physical Exam Constitutional:      Appearance: Normal appearance. He is obese.  HENT:     Head: Normocephalic.     Mouth/Throat:     Mouth: Mucous membranes are moist.     Pharynx: Oropharynx is clear.  Eyes:     Extraocular Movements: Extraocular movements intact.     Pupils: Pupils are equal, round, and reactive to light.  Cardiovascular:     Rate and Rhythm: Normal rate and regular rhythm.     Pulses:  Normal pulses.     Heart sounds: Normal heart sounds.  Pulmonary:     Effort: Pulmonary effort is normal.     Breath sounds: Normal breath sounds.  Abdominal:     Palpations: Abdomen is soft.     Tenderness: There is no abdominal tenderness.  Musculoskeletal:     Cervical back: No tenderness.  Lymphadenopathy:     Cervical: No cervical adenopathy.  Skin:    General: Skin is warm and dry.     Findings: Lesion present.     Comments: Hyperpigmented skin lesion to mid upper back, base of neck posteriorly  Neurological:     General: No focal deficit present.     Mental Status: He is alert and oriented to person, place, and time.  Psychiatric:        Mood and Affect: Mood normal.        Behavior: Behavior normal.      ASSESSMENT & PLAN: A total of 45 minutes was spent with the patient and counseling/coordination of care regarding preparing for this visit, review of most recent office visit notes, review of multiple chronic medical conditions and their management, cardiovascular risks associated with hypertension and dyslipidemia and morbid obesity, need for dermatology evaluation of hyperpigmented skin lesion, review of all medications, review of most recent bloodwork results, review of health maintenance items, education on nutrition, prognosis, documentation, and need for follow up.   Problem List Items Addressed This Visit       Cardiovascular and Mediastinum   Essential hypertension - Primary   BP Readings from Last 3 Encounters:  10/29/23 122/82  07/01/23 118/70  05/01/23 118/70  Well-controlled hypertension Continues Zestoretic  20-12.5 mg once a day Cardiovascular risks associated with hypertension discussed       Relevant Orders   Comprehensive metabolic panel with GFR   CBC with Differential/Platelet     Musculoskeletal and Integument   Hyperpigmented skin lesion   Relevant Orders  Ambulatory referral to Dermatology     Genitourinary   Benign prostatic  hyperplasia   Well-controlled lower urinary tract symptoms Continues tamsulosin  0.8 milligrams at bedtime      Malignant neoplasm of prostate (HCC)   No evidence of metastatic bone disease. Sees oncologist on a regular basis. Treatment going well.  No complications so far.  Tolerating it well.        Other   Obesity, morbid (HCC)   Diet and nutrition discussed Benefits of exercise discussed Advised to decrease amount of daily carbohydrate intake and daily calories and increase amount of plant-based protein in his diet      Prediabetes   Diet and nutrition discussed Advised to decrease amount of daily carbohydrate intake and daily calories and increase amount of plant based protein in his diet Hemoglobin A1c done today      Relevant Orders   Hemoglobin A1c   Dyslipidemia   Chronic stable condition.  Continues rosuvastatin  10 mg daily Diet and nutrition discussed Lipid profile done today      Relevant Orders   Lipid panel   Patient Instructions  Health Maintenance After Age 40 After age 81, you are at a higher risk for certain long-term diseases and infections as well as injuries from falls. Falls are a major cause of broken bones and head injuries in people who are older than age 20. Getting regular preventive care can help to keep you healthy and well. Preventive care includes getting regular testing and making lifestyle changes as recommended by your health care provider. Talk with your health care provider about: Which screenings and tests you should have. A screening is a test that checks for a disease when you have no symptoms. A diet and exercise plan that is right for you. What should I know about screenings and tests to prevent falls? Screening and testing are the best ways to find a health problem early. Early diagnosis and treatment give you the best chance of managing medical conditions that are common after age 25. Certain conditions and lifestyle choices may make  you more likely to have a fall. Your health care provider may recommend: Regular vision checks. Poor vision and conditions such as cataracts can make you more likely to have a fall. If you wear glasses, make sure to get your prescription updated if your vision changes. Medicine review. Work with your health care provider to regularly review all of the medicines you are taking, including over-the-counter medicines. Ask your health care provider about any side effects that may make you more likely to have a fall. Tell your health care provider if any medicines that you take make you feel dizzy or sleepy. Strength and balance checks. Your health care provider may recommend certain tests to check your strength and balance while standing, walking, or changing positions. Foot health exam. Foot pain and numbness, as well as not wearing proper footwear, can make you more likely to have a fall. Screenings, including: Osteoporosis screening. Osteoporosis is a condition that causes the bones to get weaker and break more easily. Blood pressure screening. Blood pressure changes and medicines to control blood pressure can make you feel dizzy. Depression screening. You may be more likely to have a fall if you have a fear of falling, feel depressed, or feel unable to do activities that you used to do. Alcohol use screening. Using too much alcohol can affect your balance and may make you more likely to have a fall. Follow these instructions at  home: Lifestyle Do not drink alcohol if: Your health care provider tells you not to drink. If you drink alcohol: Limit how much you have to: 0-1 drink a day for women. 0-2 drinks a day for men. Know how much alcohol is in your drink. In the [place] one drink equals one 12 oz bottle of beer (355 mL), one 5 oz glass of wine (148 mL), or one 1 oz glass of hard liquor (44 mL). Do not use any products that contain nicotine or tobacco. These products include cigarettes, chewing  tobacco, and vaping devices, such as e-cigarettes. If you need help quitting, ask your health care provider. Activity  Follow a regular exercise program to stay fit. This will help you maintain your balance. Ask your health care provider what types of exercise are appropriate for you. If you need a cane or walker, use it as recommended by your health care provider. Wear supportive shoes that have nonskid soles. Safety  Remove any tripping hazards, such as rugs, cords, and clutter. Install safety equipment such as grab bars in bathrooms and safety rails on stairs. Keep rooms and walkways well-lit. General instructions Talk with your health care provider about your risks for falling. Tell your health care provider if: You fall. Be sure to tell your health care provider about all falls, even ones that seem minor. You feel dizzy, tiredness (fatigue), or off-balance. Take over-the-counter and prescription medicines only as told by your health care provider. These include supplements. Eat a healthy diet and maintain a healthy weight. A healthy diet includes low-fat dairy products, low-fat (lean) meats, and fiber from whole grains, beans, and lots of fruits and vegetables. Stay current with your vaccines. Schedule regular health, dental, and eye exams. Summary Having a healthy lifestyle and getting preventive care can help to protect your health and wellness after age 85. Screening and testing are the best way to find a health problem early and help you avoid having a fall. Early diagnosis and treatment give you the best chance for managing medical conditions that are more common for people who are older than age 19. Falls are a major cause of broken bones and head injuries in people who are older than age 8. Take precautions to prevent a fall at home. Work with your health care provider to learn what changes you can make to improve your health and wellness and to prevent falls. This information is  not intended to replace advice given to you by your health care provider. Make sure you discuss any questions you have with your health care provider. Document Revised: 10/16/2020 Document Reviewed: 10/16/2020 Elsevier Patient Education  2024 [employer] Inc[employer]     Maryagnes Small, MD [facility]

## 2023-10-29 NOTE — Assessment & Plan Note (Signed)
Diet and nutrition discussed. Benefits of exercise discussed. Advised to decrease amount of daily carbohydrate intake and daily calories and increase amount of plant-based protein in his diet 

## 2023-10-29 NOTE — Assessment & Plan Note (Signed)
 Chronic stable condition.  Continues rosuvastatin  10 mg daily Diet and nutrition discussed Lipid profile done today

## 2023-11-07 ENCOUNTER — Other Ambulatory Visit: Payer: Self-pay | Admitting: Emergency Medicine

## 2023-11-07 DIAGNOSIS — I10 Essential (primary) hypertension: Secondary | ICD-10-CM

## 2023-11-10 ENCOUNTER — Other Ambulatory Visit: Payer: Self-pay | Admitting: Emergency Medicine

## 2023-11-10 DIAGNOSIS — F5104 Psychophysiologic insomnia: Secondary | ICD-10-CM

## 2023-11-13 DIAGNOSIS — M545 Low back pain, unspecified: Secondary | ICD-10-CM | POA: Diagnosis not present

## 2023-12-10 DIAGNOSIS — M545 Low back pain, unspecified: Secondary | ICD-10-CM | POA: Diagnosis not present

## 2023-12-24 DIAGNOSIS — M545 Low back pain, unspecified: Secondary | ICD-10-CM | POA: Diagnosis not present

## 2023-12-25 DIAGNOSIS — M545 Low back pain, unspecified: Secondary | ICD-10-CM | POA: Diagnosis not present

## 2023-12-31 DIAGNOSIS — M545 Low back pain, unspecified: Secondary | ICD-10-CM | POA: Diagnosis not present

## 2024-01-08 DIAGNOSIS — M545 Low back pain, unspecified: Secondary | ICD-10-CM | POA: Diagnosis not present

## 2024-01-13 DIAGNOSIS — M545 Low back pain, unspecified: Secondary | ICD-10-CM | POA: Diagnosis not present

## 2024-01-21 DIAGNOSIS — M545 Low back pain, unspecified: Secondary | ICD-10-CM | POA: Diagnosis not present

## 2024-02-16 DIAGNOSIS — M1712 Unilateral primary osteoarthritis, left knee: Secondary | ICD-10-CM | POA: Diagnosis not present

## 2024-03-11 ENCOUNTER — Ambulatory Visit (INDEPENDENT_AMBULATORY_CARE_PROVIDER_SITE_OTHER)

## 2024-03-11 VITALS — Ht 74.0 in | Wt 313.0 lb

## 2024-03-11 DIAGNOSIS — Z0000 Encounter for general adult medical examination without abnormal findings: Secondary | ICD-10-CM | POA: Diagnosis not present

## 2024-03-11 NOTE — Patient Instructions (Addendum)
 [name] [name],  Thank you for taking the time for your Medicare Wellness Visit. I appreciate your continued commitment to your health goals. Please review the care plan we discussed, and feel free to reach out if I can assist you further.  Medicare recommends these wellness visits once per year to help you and your care team stay ahead of potential health issues. These visits are designed to focus on prevention, allowing your provider to concentrate on managing your acute and chronic conditions during your regular appointments.  Please note that Annual Wellness Visits do not include a physical exam. Some assessments may be limited, especially if the visit was conducted virtually. If needed, we may recommend a separate in-person follow-up with your provider.  Ongoing Care Seeing your primary care provider every 3 to 6 months helps us  monitor your health and provide consistent, personalized care.   Referrals If a referral was made during today's visit and you haven't received any updates within two weeks, please contact the referred provider directly to check on the status.  Recommended Screenings:  Health Maintenance  Topic Date Due   Flu Shot  01/09/2024   COVID-19 Vaccine (4 - 2025-26 season) 02/09/2024   Medicare Annual Wellness Visit  03/11/2025   DTaP/Tdap/Td vaccine (3 - Td or Tdap) 04/14/2030   Colon Cancer Screening  04/24/2030   Pneumococcal Vaccine for age over 43  Completed   Hepatitis C Screening  Completed   Zoster (Shingles) Vaccine  Completed   HPV Vaccine  Aged Out   Meningitis B Vaccine  Aged Out       03/11/2024    1:49 PM  Advanced Directives  Does Patient Have a Medical Advance Directive? No  Would patient like information on creating a medical advance directive? No - Patient declined   Advance Care Planning is important because it: Ensures you receive medical care that aligns with your values, goals, and preferences. Provides guidance to your family and loved  ones, reducing the emotional burden of decision-making during critical moments.  Vision: Annual vision screenings are recommended for early detection of glaucoma, cataracts, and diabetic retinopathy. These exams can also reveal signs of chronic conditions such as diabetes and high blood pressure.  Dental: Annual dental screenings help detect early signs of oral cancer, gum disease, and other conditions linked to overall health, including heart disease and diabetes.

## 2024-03-11 NOTE — Progress Notes (Signed)
 Subjective:   [name] [name] is a 73 y.o. who presents for a Medicare Wellness preventive visit.  As a reminder, Annual Wellness Visits don't include a physical exam, and some assessments may be limited, especially if this visit is performed virtually. We may recommend an in-person follow-up visit with your provider if needed.  Visit Complete: Virtual I connected with  Jerre Hales on 03/11/24 by a audio enabled telemedicine application and verified that I am speaking with the correct person using two identifiers.  Patient Location: Home  Provider Location: Office/Clinic  I discussed the limitations of evaluation and management by telemedicine. The patient expressed understanding and agreed to proceed.  Vital Signs: Because this visit was a virtual/telehealth visit, some criteria may be missing or patient reported. Any vitals not documented were not able to be obtained and vitals that have been documented are patient reported.  VideoDeclined- This patient declined Librarian, academic. Therefore the visit was completed with audio only.  Persons Participating in Visit: Patient.  AWV Questionnaire: Yes: Patient Medicare AWV questionnaire was completed by the patient on 03/07/2024; I have confirmed that all information answered by patient is correct and no changes since this date.  Cardiac Risk Factors include: advanced age (>23men, >50 women);dyslipidemia;male gender;hypertension     Objective:    Today's Vitals   03/11/24 1350  Weight: (!) 313 lb (142 kg)  Height: 6' 2 (1.88 m)   Body mass index is 40.19 kg/m.     03/11/2024    1:49 PM 07/15/2023    8:33 AM 04/07/2023    6:56 AM 03/09/2023   11:26 AM 03/06/2023    1:55 PM 02/13/2023    5:23 AM 01/31/2023    9:59 AM  Advanced Directives  Does Patient Have a Medical Advance Directive? No No No No No No No  Would patient like information on creating a medical advance directive? No - Patient declined  No - Patient declined No - Patient declined No - Patient declined  No - Patient declined No - Guardian declined    Current Medications (verified) Outpatient Encounter Medications as of 03/11/2024  Medication Sig   ALPRAZolam  (XANAX ) 0.5 MG tablet TAKE 1 TABLET BY MOUTH EVERY DAY AS NEEDED FOR ANXIETY   lisinopril -hydrochlorothiazide  (ZESTORETIC ) 20-12.5 MG tablet TAKE 1 TABLET BY MOUTH EVERY DAY   meloxicam (MOBIC) 15 MG tablet Take 15 mg by mouth as needed for pain.   rosuvastatin  (CRESTOR ) 10 MG tablet TAKE 1 TABLET BY MOUTH EVERY DAY   tamsulosin  (FLOMAX ) 0.4 MG CAPS capsule Take 0.8 mg by mouth daily.   zolpidem  (AMBIEN ) 5 MG tablet TAKE 1 TABLET BY MOUTH AT BEDTIME AS NEEDED FOR SLEEP.   No facility-administered encounter medications on file as of 03/11/2024.    Allergies (verified) Aspirin , Statins, and Gadolinium derivatives   History: Past Medical History:  Diagnosis Date   Allergy    seasonal allergies   Arthritis    generalized   Blood transfusion without reported diagnosis 2013   when had diverticulitis flare   BPH (benign prostatic hypertrophy)    Colon polyp    diverticular bleed with transfusion   Diverticulosis    History of kidney stones    Hyperlipidemia    on meds   Hypertension    on meds   Kidney stones    hx of 15-20 kidney stones    Obesity, unspecified    Pre-diabetes    Prostate cancer (HCC) 2020   dx 05/2019   Prostate  nodule 07/27/2008   Past Surgical History:  Procedure Laterality Date   ANKLE FUSION Left 1991   COLONOSCOPY  2011   DB-F/V-miralax (good)-TICS/3 HPP   CYSTOSCOPY W/ RETROGRADES Bilateral 08/02/2022   Procedure: CYSTOSCOPY WITH BILATERAL RETROGRADE PYELOGRAM;  Surgeon: Carolee Sherwood JONETTA DOUGLAS, MD;  Location: [facility];  Service: Urology;  Laterality: Bilateral;   HERNIA REPAIR N/A    umbilical    IR 3D INDEPENDENT WKST  04/07/2023   IR ANGIOGRAM PELVIS SELECTIVE OR SUPRASELECTIVE  04/07/2023   IR ANGIOGRAM SELECTIVE EACH ADDITIONAL  VESSEL  04/07/2023   IR ANGIOGRAM SELECTIVE EACH ADDITIONAL VESSEL  04/07/2023   IR EMBO ARTERIAL NOT HEMORR HEMANG INC GUIDE ROADMAPPING  04/07/2023   IR RADIOLOGIST EVAL & MGMT  03/19/2023   IR RADIOLOGIST EVAL & MGMT  05/07/2023   IR RADIOLOGIST EVAL & MGMT  10/06/2023   IR US  GUIDE VASC ACCESS RIGHT  04/07/2023   POLYPECTOMY  2011   3 HPP   TOTAL KNEE ARTHROPLASTY Right 03/15/2022   Procedure: TOTAL KNEE ARTHROPLASTY;  Surgeon: Shari Sieving, MD;  Location: [facility];  Service: Orthopedics;  Laterality: Right;   TRANSURETHRAL RESECTION OF BLADDER TUMOR N/A 08/02/2022   Procedure: TRANSURETHRAL RESECTION OF BLADDER TUMOR (TURBT);  Surgeon: Carolee Sherwood JONETTA DOUGLAS, MD;  Location: [facility];  Service: Urology;  Laterality: N/A;  60 MINS   TRANSURETHRAL RESECTION OF PROSTATE  2020   Family History  Problem Relation Age of Onset   Diabetes Mother    Breast cancer Mother 43   Prostate cancer Father        patient did not have a relationship with his father but understands he had prostate ca   Colon polyps Father 35   Colon cancer Father 58   Diabetes Sister    Pancreatic cancer Neg Hx    Esophageal cancer Neg Hx    Stomach cancer Neg Hx    Rectal cancer Neg Hx    Social History   Socioeconomic History   Marital status: Married    Spouse name: [name]   Number of children: 2   Years of education: Not on file   Highest education level: 12th grade  Occupational History   Occupation: Retired  Tobacco Use   Smoking status: Never   Smokeless tobacco: Never  Vaping Use   Vaping status: Never Used  Substance and Sexual Activity   Alcohol use: No   Drug use: No   Sexual activity: Yes  Other Topics Concern   Not on file  Social History Narrative   Lives with wife.   Social Drivers of Corporate investment banker Strain: Low Risk  (03/11/2024)   Overall Financial Resource Strain (CARDIA)    Difficulty of Paying Living Expenses: Not hard at all  Food Insecurity: No Food Insecurity  (03/11/2024)   Hunger Vital Sign    Worried About Running Out of Food in the Last Year: Never true    Ran Out of Food in the Last Year: Never true  Transportation Needs: No Transportation Needs (03/11/2024)   PRAPARE - Administrator, Civil Service (Medical): No    Lack of Transportation (Non-Medical): No  Physical Activity: Sufficiently Active (03/11/2024)   Exercise Vital Sign    Days of Exercise per Week: 3 days    Minutes of Exercise per Session: 60 min  Stress: No Stress Concern Present (03/11/2024)   [employer] of Occupational Health - Occupational Stress Questionnaire    Feeling of Stress: Not at  all  Social Connections: Socially Integrated (03/11/2024)   Social Connection and Isolation Panel    Frequency of Communication with Friends and Family: Three times a week    Frequency of Social Gatherings with Friends and Family: Once a week    Attends Religious Services: More than 4 times per year    Active Member of [employer] or Organizations: Yes    Attends Engineer, structural: More than 4 times per year    Marital Status: Married    Tobacco Counseling Counseling given: Not Answered    Clinical Intake:  Pre-visit preparation completed: Yes  Pain : No/denies pain     BMI - recorded: 40.19 Nutritional Status: BMI > 30  Obese Nutritional Risks: None Diabetes: No  Lab Results  Component Value Date   HGBA1C 6.6 (H) 10/29/2023   HGBA1C 5.6 07/30/2022   HGBA1C 5.8 (H) 03/04/2022     How often do you need to have someone help you when you read instructions, pamphlets, or other written materials from your doctor or pharmacy?: 1 - Never  Interpreter Needed?: No  Information entered by :: Verdie Saba, CMA   Activities of Daily Living     03/11/2024    1:52 PM 03/07/2024    5:48 PM  In your present state of health, do you have any difficulty performing the following activities:  Hearing? 0 0  Vision? 0 0  Difficulty concentrating or making  decisions? 0 0  Walking or climbing stairs? 1 1  Dressing or bathing? 0 0  Doing errands, shopping? 0 0  Preparing Food and eating ? N N  Using the Toilet? N N  In the past six months, have you accidently leaked urine? Y Y  Do you have problems with loss of bowel control? N N  Managing your Medications? N N  Managing your Finances? N N  Housekeeping or managing your Housekeeping? N N    Patient Care Team: Purcell Emil Schanz, MD as PCP - General (Internal Medicine) Grayce Buddle, RN Nurse Navigator as Registered Nurse (Medical Oncology)  I have updated your Care Teams any recent Medical Services you may have received from other providers in the past year.     Assessment:   This is a routine wellness examination for [name] [name].  Hearing/Vision screen Hearing Screening - Comments:: Denies hearing difficulties   Vision Screening - Comments:: Wears eyeglasses for reading - up to date with routine eye exams with [facility]   Goals Addressed               This Visit's Progress     Patient Stated (pt-stated)        Patient stated he plans to continue walking       Depression Screen     03/11/2024    1:54 PM 10/29/2023    8:39 AM 07/01/2023    8:07 AM 05/01/2023    8:35 AM 03/06/2023    2:00 PM 10/30/2022    8:36 AM 04/11/2022    2:37 PM  PHQ 2/9 Scores  PHQ - 2 Score 0 0 0 0 1 0 0  PHQ- 9 Score 0 0   6 0     Fall Risk     03/11/2024    1:53 PM 03/07/2024    5:48 PM 10/29/2023    8:39 AM 07/01/2023    8:06 AM 05/01/2023    8:35 AM  Fall Risk   Falls in the past year? 0 1 0 0 0  Comment No falls - confirmed w/pt      Number falls in past yr: 0 0 0 0 0  Injury with Fall? 0 0 0 0 0  Risk for fall due to : No Fall Risks  No Fall Risks No Fall Risks No Fall Risks  Follow up Falls evaluation completed;Falls prevention discussed  Falls evaluation completed Falls evaluation completed Falls evaluation completed    MEDICARE RISK AT HOME:  Medicare Risk at Home Any stairs  in or around the home?: Yes If so, are there any without handrails?: No Home free of loose throw rugs in walkways, pet beds, electrical cords, etc?: Yes Adequate lighting in your home to reduce risk of falls?: Yes Life alert?: No Use of a cane, walker or w/c?: Yes (cane) Grab bars in the bathroom?: No Shower chair or bench in shower?: Yes Elevated toilet seat or a handicapped toilet?: Yes  TIMED UP AND GO:  Was the test performed?  No  Cognitive Function: 6CIT completed        03/11/2024    1:55 PM 03/06/2023    1:56 PM 04/11/2022    2:43 PM  6CIT Screen  What Year? 0 points 0 points 0 points  What month? 0 points 0 points 0 points  What time? 0 points 0 points 0 points  Count back from 20 0 points 0 points 0 points  Months in reverse 0 points 0 points 0 points  Repeat phrase 0 points 2 points 0 points  Total Score 0 points 2 points 0 points    Immunizations Immunization History  Administered Date(s) Administered   Fluad Quad(high Dose 65+) 04/14/2020   Fluad Trivalent(High Dose 65+) 05/01/2023   INFLUENZA, HIGH DOSE SEASONAL PF 03/03/2018, 04/05/2019   Influenza Split 08/01/2010   Influenza,inj,Quad PF,6+ Mos 02/25/2015, 04/05/2016   Influenza-Unspecified 03/20/2019   PFIZER(Purple Top)SARS-COV-2 Vaccination 07/06/2019, 07/27/2019, 05/11/2020   Pneumococcal Conjugate-13 03/03/2018   Pneumococcal Polysaccharide-23 02/25/2007, 06/07/2019   Td 04/14/2020   Tdap 07/31/2009   Zoster Recombinant(Shingrix) 11/16/2020, 01/23/2021    Screening Tests Health Maintenance  Topic Date Due   Influenza Vaccine  01/09/2024   COVID-19 Vaccine (4 - 2025-26 season) 02/09/2024   Medicare Annual Wellness (AWV)  03/11/2025   DTaP/Tdap/Td (3 - Td or Tdap) 04/14/2030   Colonoscopy  04/24/2030   Pneumococcal Vaccine: 50+ Years  Completed   Hepatitis C Screening  Completed   Zoster Vaccines- Shingrix  Completed   HPV VACCINES  Aged Out   Meningococcal B Vaccine  Aged Out    Health  Maintenance Items Addressed:  03/11/2024  Additional Screening:  Vision Screening: Recommended annual ophthalmology exams for early detection of glaucoma and other disorders of the eye. Is the patient up to date with their annual eye exam?  Yes  Who is the provider or what is the name of the office in which the patient attends annual eye exams? [facility]  Dental Screening: Recommended annual dental exams for proper oral Nurse, mental health Referral / Chronic Care Management: CRR required this visit?  No   CCM required this visit?  No   Plan:    I have personally reviewed and noted the following in the patient's chart:   Medical and social history Use of alcohol, tobacco or illicit drugs  Current medications and supplements including opioid prescriptions. Patient is not currently taking opioid prescriptions. Functional ability and status Nutritional status Physical activity Advanced directives List of other physicians Hospitalizations, surgeries, and ER visits in previous  12 months Vitals Screenings to include cognitive, depression, and falls Referrals and appointments  In addition, I have reviewed and discussed with patient certain preventive protocols, quality metrics, and best practice recommendations. A written personalized care plan for preventive services as well as general preventive health recommendations were provided to patient.   Verdie CHRISTELLA Saba, CMA   03/11/2024   After Visit Summary: (MyChart) Due to this being a telephonic visit, the after visit summary with patients personalized plan was offered to patient via MyChart   Notes: Nothing significant to report at this time.

## 2024-04-04 ENCOUNTER — Other Ambulatory Visit: Payer: Self-pay | Admitting: Emergency Medicine

## 2024-04-04 DIAGNOSIS — F5104 Psychophysiologic insomnia: Secondary | ICD-10-CM

## 2024-04-05 ENCOUNTER — Ambulatory Visit: Admitting: Physician Assistant

## 2024-04-05 ENCOUNTER — Encounter: Payer: Self-pay | Admitting: Physician Assistant

## 2024-04-05 VITALS — BP 116/74 | HR 76

## 2024-04-05 DIAGNOSIS — D492 Neoplasm of unspecified behavior of bone, soft tissue, and skin: Secondary | ICD-10-CM

## 2024-04-05 DIAGNOSIS — L700 Acne vulgaris: Secondary | ICD-10-CM | POA: Diagnosis not present

## 2024-04-05 NOTE — Patient Instructions (Signed)

## 2024-04-05 NOTE — Progress Notes (Signed)
   New Patient Visit   Subjective  [name] [name] is a 73 y.o. male NEW PATIENT who presents for the following: spot check  Patient states he  has irritated mole located at the posterior neck that he  would like to have examined. Patient reports the areas have been there for 3 years. He reports the areas are bothersome.Patient rates irritation 8 out of 10, patient states it will get tender to touch. He states that the areas have not spread. Patient reports he  has not previously been treated for these areas. Patient denies Hx of bx. Patient denies family history of skin cancer(s).     The following portions of the chart were reviewed this encounter and updated as appropriate: medications, allergies, medical history  Review of Systems:  No other skin or systemic complaints except as noted in HPI or Assessment and Plan.  Objective  Well appearing patient in no apparent distress; mood and affect are within normal limits.  A focused examination was performed of the following areas: Back and neck  Relevant exam findings are noted in the Assessment and Plan.  12 cm soft slightly mobile tumor (open comedone overlying)          Assessment & Plan   NEOPLASM OF UNCERTAIN BEHAVIOR -POSTERIOR NECK  - patient states it has been there for many years - asymptomatic and not changing in size. Is not interested in any further workup at this time.  - Ddx includes cyst vs: lipoma  - 12 cm in widest diameter    OPEN COMEDONE - POSTERIOR NECK - removed without difficulty   OPEN COMEDONE   NEOPLASM OF SOFT TISSUE    Return if symptoms worsen or fail to improve.  I, Doyce Pan, CMA, am acting as scribe for Dalisa Forrer K, PA-C.   Documentation: I have reviewed the above documentation for accuracy and completeness, and I agree with the above.  [name] [name] K, PA-C

## 2024-04-28 ENCOUNTER — Ambulatory Visit: Admitting: Emergency Medicine

## 2024-04-28 ENCOUNTER — Encounter: Payer: Self-pay | Admitting: Emergency Medicine

## 2024-04-28 VITALS — BP 124/80 | HR 68 | Temp 98.3°F | Ht 74.0 in | Wt 325.0 lb

## 2024-04-28 DIAGNOSIS — E785 Hyperlipidemia, unspecified: Secondary | ICD-10-CM

## 2024-04-28 DIAGNOSIS — Z23 Encounter for immunization: Secondary | ICD-10-CM

## 2024-04-28 DIAGNOSIS — R29818 Other symptoms and signs involving the nervous system: Secondary | ICD-10-CM

## 2024-04-28 DIAGNOSIS — E1159 Type 2 diabetes mellitus with other circulatory complications: Secondary | ICD-10-CM | POA: Diagnosis not present

## 2024-04-28 DIAGNOSIS — Z7985 Long-term (current) use of injectable non-insulin antidiabetic drugs: Secondary | ICD-10-CM

## 2024-04-28 DIAGNOSIS — I152 Hypertension secondary to endocrine disorders: Secondary | ICD-10-CM

## 2024-04-28 DIAGNOSIS — E1169 Type 2 diabetes mellitus with other specified complication: Secondary | ICD-10-CM

## 2024-04-28 DIAGNOSIS — N400 Enlarged prostate without lower urinary tract symptoms: Secondary | ICD-10-CM

## 2024-04-28 LAB — POCT GLYCOSYLATED HEMOGLOBIN (HGB A1C): HbA1c POC (<> result, manual entry): 6.4 % (ref 4.0–5.6)

## 2024-04-28 MED ORDER — OZEMPIC (0.25 OR 0.5 MG/DOSE) 2 MG/3ML ~~LOC~~ SOPN: 0.2500 mg | PEN_INJECTOR | SUBCUTANEOUS | 3 refills | Status: DC

## 2024-04-28 NOTE — Assessment & Plan Note (Signed)
 Diet and nutrition discussed Benefits of exercise discussed Advised to decrease amount of daily carbohydrate intake and daily calories and increase amount of plant-based protein in his diet Recommend to start GLP-1 agonist as per medical insurance's formulary

## 2024-04-28 NOTE — Assessment & Plan Note (Signed)
 Snores loudly.  Wakes up tired Has some daytime somnolence Suspected sleep apnea Recommend sleep studies Referral placed today

## 2024-04-28 NOTE — Progress Notes (Signed)
 [name] [name] 73 y.o.   Chief Complaint  Patient presents with   Follow-up    HISTORY OF PRESENT ILLNESS: This is a 73 y.o. male here for 75-month follow-up of chronic medical conditions including hypertension and diabetes. Eating better but not losing weight.  Inquiring about GLP-1 agonist.  Interested in starting them. No other complaints or medical concerns today.  HPI   Prior to Admission medications   Medication Sig Start Date End Date Taking? Authorizing Provider  Semaglutide,0.25 or 0.5MG /DOS, (OZEMPIC, 0.25 OR 0.5 MG/DOSE,) 2 MG/3ML SOPN Inject 0.25 mg into the skin once a week. 04/28/24  Yes Baldemar Dady, Emil Schanz, MD  ALPRAZolam  (XANAX ) 0.5 MG tablet TAKE 1 TABLET BY MOUTH EVERY DAY AS NEEDED FOR ANXIETY 03/31/23   Purcell Emil Schanz, MD  lisinopril -hydrochlorothiazide  (ZESTORETIC ) 20-12.5 MG tablet TAKE 1 TABLET BY MOUTH EVERY DAY 11/07/23   Purcell Emil Schanz, MD  meloxicam (MOBIC) 15 MG tablet Take 15 mg by mouth as needed for pain. 04/07/23   [provider]  rosuvastatin  (CRESTOR ) 10 MG tablet TAKE 1 TABLET BY MOUTH EVERY DAY 02/14/23   Purcell Emil Schanz, MD  tamsulosin  (FLOMAX ) 0.4 MG CAPS capsule Take 0.8 mg by mouth daily. 01/03/23   [provider]  zolpidem  (AMBIEN ) 5 MG tablet TAKE 1 TABLET BY MOUTH EVERY DAY AT BEDTIME AS NEEDED FOR SLEEP 04/05/24   Purcell Emil Schanz, MD    Allergies  Allergen Reactions   Aspirin      REACTION: upsets stomach   Statins     REACTION: UPSETS STOMACH   Gadolinium Derivatives Nausea And Vomiting    Pt was given 20 ml multihance  and became nauseated and vomited several times.     Patient Active Problem List   Diagnosis Date Noted   Hyperpigmented skin lesion 10/29/2023   Osteoarthritis of spine with radiculopathy, lumbar region 07/01/2023   Chronic bilateral low back pain with left-sided sciatica 07/01/2023   Chronic insomnia 05/01/2023   S/P total knee arthroplasty, right 03/15/2022   Body mass  index (BMI) of 38.0-38.9 in adult 01/10/2020   Diverticulosis 01/10/2020   Prediabetes 01/10/2020   Dyslipidemia 01/10/2020   Malignant neoplasm of prostate (HCC) 06/08/2019   Prostate enlargement 08/29/2017   Gout 10/25/2014   Hypertension associated with diabetes (HCC)    Benign prostatic hyperplasia    Obesity, morbid (HCC)    Kidney stones    Prostate nodule     Past Medical History:  Diagnosis Date   Allergy    seasonal allergies   Arthritis    generalized   Blood transfusion without reported diagnosis 2013   when had diverticulitis flare   BPH (benign prostatic hypertrophy)    Colon polyp    diverticular bleed with transfusion   Diverticulosis    History of kidney stones    Hyperlipidemia    on meds   Hypertension    on meds   Kidney stones    hx of 15-20 kidney stones    Obesity, unspecified    Pre-diabetes    Prostate cancer (HCC) 2020   dx 05/2019   Prostate nodule 07/27/2008    Past Surgical History:  Procedure Laterality Date   ANKLE FUSION Left 1991   COLONOSCOPY  2011   DB-F/V-miralax (good)-TICS/3 HPP   CYSTOSCOPY W/ RETROGRADES Bilateral 08/02/2022   Procedure: CYSTOSCOPY WITH BILATERAL RETROGRADE PYELOGRAM;  Surgeon: Carolee Sherwood JONETTA DOUGLAS, MD;  Location: [facility];  Service: Urology;  Laterality: Bilateral;   HERNIA REPAIR N/A    umbilical  IR 3D INDEPENDENT WKST  04/07/2023   IR ANGIOGRAM PELVIS SELECTIVE OR SUPRASELECTIVE  04/07/2023   IR ANGIOGRAM SELECTIVE EACH ADDITIONAL VESSEL  04/07/2023   IR ANGIOGRAM SELECTIVE EACH ADDITIONAL VESSEL  04/07/2023   IR EMBO ARTERIAL NOT HEMORR HEMANG INC GUIDE ROADMAPPING  04/07/2023   IR RADIOLOGIST EVAL & MGMT  03/19/2023   IR RADIOLOGIST EVAL & MGMT  05/07/2023   IR RADIOLOGIST EVAL & MGMT  10/06/2023   IR US  GUIDE VASC ACCESS RIGHT  04/07/2023   POLYPECTOMY  2011   3 HPP   TOTAL KNEE ARTHROPLASTY Right 03/15/2022   Procedure: TOTAL KNEE ARTHROPLASTY;  Surgeon: Shari Sieving, MD;  Location: [facility];   Service: Orthopedics;  Laterality: Right;   TRANSURETHRAL RESECTION OF BLADDER TUMOR N/A 08/02/2022   Procedure: TRANSURETHRAL RESECTION OF BLADDER TUMOR (TURBT);  Surgeon: Carolee Sherwood JONETTA DOUGLAS, MD;  Location: [facility];  Service: Urology;  Laterality: N/A;  60 MINS   TRANSURETHRAL RESECTION OF PROSTATE  2020    Social History   Socioeconomic History   Marital status: Married    Spouse name: [name]   Number of children: 2   Years of education: Not on file   Highest education level: 12th grade  Occupational History   Occupation: Retired  Tobacco Use   Smoking status: Never   Smokeless tobacco: Never  Vaping Use   Vaping status: Never Used  Substance and Sexual Activity   Alcohol use: No   Drug use: No   Sexual activity: Yes  Other Topics Concern   Not on file  Social History Narrative   Lives with wife.   Social Drivers of Corporate Investment Banker Strain: Low Risk  (03/11/2024)   Overall Financial Resource Strain (CARDIA)    Difficulty of Paying Living Expenses: Not hard at all  Food Insecurity: No Food Insecurity (03/11/2024)   Hunger Vital Sign    Worried About Running Out of Food in the Last Year: Never true    Ran Out of Food in the Last Year: Never true  Transportation Needs: No Transportation Needs (03/11/2024)   PRAPARE - Administrator, Civil Service (Medical): No    Lack of Transportation (Non-Medical): No  Physical Activity: Sufficiently Active (03/11/2024)   Exercise Vital Sign    Days of Exercise per Week: 3 days    Minutes of Exercise per Session: 60 min  Stress: No Stress Concern Present (03/11/2024)   [employer] of Occupational Health - Occupational Stress Questionnaire    Feeling of Stress: Not at all  Social Connections: Socially Integrated (03/11/2024)   Social Connection and Isolation Panel    Frequency of Communication with Friends and Family: Three times a week    Frequency of Social Gatherings with Friends and Family: Once a week     Attends Religious Services: More than 4 times per year    Active Member of [employer] or Organizations: Yes    Attends Engineer, Structural: More than 4 times per year    Marital Status: Married  Catering Manager Violence: Not At Risk (03/11/2024)   Humiliation, Afraid, Rape, and Kick questionnaire    Fear of Current or Ex-Partner: No    Emotionally Abused: No    Physically Abused: No    Sexually Abused: No    Family History  Problem Relation Age of Onset   Diabetes Mother    Breast cancer Mother 33   Prostate cancer Father        patient  did not have a relationship with his father but understands he had prostate ca   Colon polyps Father 60   Colon cancer Father 41   Diabetes Sister    Pancreatic cancer Neg Hx    Esophageal cancer Neg Hx    Stomach cancer Neg Hx    Rectal cancer Neg Hx      Review of Systems  Constitutional: Negative.  Negative for chills and fever.  HENT: Negative.  Negative for congestion and sore throat.   Respiratory: Negative.  Negative for cough and shortness of breath.   Cardiovascular: Negative.  Negative for chest pain and palpitations.  Gastrointestinal:  Negative for abdominal pain, diarrhea, nausea and vomiting.  Genitourinary: Negative.  Negative for dysuria and hematuria.  Skin: Negative.  Negative for rash.  Neurological: Negative.  Negative for dizziness and headaches.  All other systems reviewed and are negative.   Vitals:   04/28/24 0912  BP: 124/80  Pulse: 68  Temp: 98.3 F (36.8 C)  SpO2: 98%    Physical Exam Vitals reviewed.  Constitutional:      Appearance: Normal appearance. He is obese.  HENT:     Head: Normocephalic.     Mouth/Throat:     Mouth: Mucous membranes are moist.     Pharynx: Oropharynx is clear.  Eyes:     Extraocular Movements: Extraocular movements intact.     Conjunctiva/sclera: Conjunctivae normal.     Pupils: Pupils are equal, round, and reactive to light.  Cardiovascular:     Rate and Rhythm:  Normal rate and regular rhythm.     Pulses: Normal pulses.     Heart sounds: Normal heart sounds.  Pulmonary:     Effort: Pulmonary effort is normal.     Breath sounds: Normal breath sounds.  Abdominal:     Palpations: Abdomen is soft.     Tenderness: There is no abdominal tenderness.  Musculoskeletal:     Cervical back: No tenderness.  Lymphadenopathy:     Cervical: No cervical adenopathy.  Skin:    General: Skin is warm and dry.     Capillary Refill: Capillary refill takes less than 2 seconds.  Neurological:     General: No focal deficit present.     Mental Status: He is alert and oriented to person, place, and time.  Psychiatric:        Mood and Affect: Mood normal.        Behavior: Behavior normal.    Results for orders placed or performed in visit on 04/28/24 (from the past 24 hours)  POCT HgB A1C     Status: Abnormal   Collection Time: 04/28/24  9:22 AM  Result Value Ref Range   Hemoglobin A1C     HbA1c POC (<> result, manual entry) 6.4 4.0 - 5.6 %   HbA1c, POC (prediabetic range)     HbA1c, POC (controlled diabetic range)        ASSESSMENT & PLAN: A total of 41 minutes was spent with the patient and counseling/coordination of care regarding preparing for this visit, review of most recent office visit notes, review of multiple chronic medical conditions and their management, cardiovascular risks associated with hypertension and diabetes, review of all medications and changes made, review of most recent bloodwork results including interpretation of today's hemoglobin A1c, review of health maintenance items, education on nutrition, prognosis, documentation, and need for follow up.   Problem List Items Addressed This Visit       Cardiovascular and Mediastinum  Hypertension associated with diabetes (HCC) - Primary   BP Readings from Last 3 Encounters:  04/28/24 124/80  04/05/24 116/74  10/29/23 122/82   Lab Results  Component Value Date   HGBA1C 6.4 04/28/2024   Well-controlled hypertension and diabetes Continue Zestoretic  20-12.5 mg daily Recommend to start Ozempic weekly Cardiovascular risks associated with hypertension and diabetes discussed Diet and nutrition discussed Recommend follow-up in 3 months       Relevant Medications   Semaglutide,0.25 or 0.5MG /DOS, (OZEMPIC, 0.25 OR 0.5 MG/DOSE,) 2 MG/3ML SOPN   Other Relevant Orders   POCT HgB A1C (Completed)     Endocrine   Dyslipidemia associated with type 2 diabetes mellitus (HCC)   Chronic stable conditions Diet and nutrition discussed Recommend weekly Ozempic and continue rosuvastatin  10 mg daily       Relevant Medications   Semaglutide,0.25 or 0.5MG /DOS, (OZEMPIC, 0.25 OR 0.5 MG/DOSE,) 2 MG/3ML SOPN     Genitourinary   Benign prostatic hyperplasia   Well-controlled lower urinary tract symptoms Continues tamsulosin  0.8 milligrams at bedtime        Other   Obesity, morbid (HCC)   Diet and nutrition discussed Benefits of exercise discussed Advised to decrease amount of daily carbohydrate intake and daily calories and increase amount of plant-based protein in his diet Recommend to start GLP-1 agonist as per medical insurance's formulary      Relevant Medications   Semaglutide,0.25 or 0.5MG /DOS, (OZEMPIC, 0.25 OR 0.5 MG/DOSE,) 2 MG/3ML SOPN   Suspected sleep apnea   Snores loudly.  Wakes up tired Has some daytime somnolence Suspected sleep apnea Recommend sleep studies Referral placed today      Relevant Orders   Ambulatory referral to Sleep Studies   Other Visit Diagnoses       Flu vaccine need       Relevant Orders   Flu vaccine HIGH DOSE PF(Fluzone Trivalent)      Patient Instructions  Diabetes: Carbohydrate Counting for Adults Carbohydrate counting is a method of keeping track of how many carbohydrates you eat. Eating carbohydrates increases the amount of sugar, also called glucose, in your blood. By counting how many carbohydrates you eat, you can improve  how well you manage your blood sugar. This, in turn, helps you manage your diabetes. Carbohydrates are measured in grams (g) per serving. It's important to know how many carbohydrates (in grams or by serving size) you can have in each meal. This is different for every person. A dietitian can help you make a meal plan and calculate how many carbohydrates you should have at each meal and snack. What foods contain carbohydrates? Carbohydrates are found in these foods: Grains, such as breads and cereals. Dried beans and soy products. Starchy vegetables, such as potatoes, peas, and corn. Fruit and fruit juices. Milk and yogurt. Sweets and snack foods like cake, cookies, candy, chips, and soft drinks. How do I count carbohydrates in foods? There are two ways to count carbohydrates in food. You can read food labels or learn standard serving sizes of foods. You can use either of these methods or a combination of both. Using the Nutrition Facts label The Nutrition Facts list is included on the labels of almost all packaged foods and drinks in the [place] It includes: The serving size. Information about nutrients in each serving. This includes the grams of carbohydrate per serving. To use the Nutrition Facts, decide how many servings you will have. Then, multiply the number of servings by the number of carbohydrates per serving. The  resulting number is the total grams of carbohydrates that you'll be having. Learning the standard serving sizes of foods When you eat carbohydrate foods that aren't packaged or don't include Nutrition Facts on the label, you need to measure the servings in order to count the grams of carbohydrates. Measure the foods that you'll eat with a food scale or measuring cup, if needed. Decide how many standard-size servings you'll eat. Multiply the number of servings by 15. For foods that contain carbohydrates, one serving equals 15 g of carbohydrates. For example, if you eat 2 cups or  10 oz (300 g) of strawberries, you'll have eaten 2 servings and 30 g of carbohydrates (2 servings x 15 g = 30 g). For foods that have more than one food mixed, such as soups and casseroles, you must count the carbohydrates in each food that's included. Here's a list of standard serving sizes for common carbohydrate-rich foods. Each of these servings has about 15 g of carbohydrates: 1 slice of bread. 1 six-inch (15 cm) tortilla. ? cup or 2 oz (53 g) of cooked rice or pasta.  cup or 3 oz (85 g) of cooked or canned, drained, and rinsed beans or lentils.  cup or 3 oz (85 g) of a starchy vegetable, such as peas, corn, or squash.  cup or 4 oz (120 g) of hot cereal.  cup or 3 oz (85 g) of boiled or mashed potatoes, or  or 3 oz (85 g) of a large baked potato.  cup or 4 fl oz (118 mL) of fruit juice. 1 cup or 8 fl oz (237 mL) of milk. 1 small or 4 oz (106 g) apple.  or 2 oz (63 g) of a medium banana. 1 cup or 5 oz (150 g) of strawberries. 3 cups or 1 oz (28.3 g) of popped popcorn. What is an example of carbohydrate counting? To calculate the grams of carbohydrates in this sample meal, follow the steps below. Sample meal 3 oz (85 g) chicken breast. ? cup or 4 oz (106 g) of brown rice.  cup or 3 oz (85 g) of corn. 1 cup or 8 fl oz (237 mL) of milk. 1 cup or 5 oz (150 g) of strawberries with sugar-free whipped topping. Carbohydrate calculation Identify the foods that have carbohydrates: Rice. Corn. Milk. Strawberries. Calculate how many servings you have of each food: 2 servings of rice. 1 serving of corn. 1 serving of milk. 1 serving of strawberries. Multiply each number of servings by 15 g: 2 servings of rice x 15 g = 30 g. 1 serving of corn x 15 g = 15 g. 1 serving of milk x 15 g = 15 g. 1 serving of strawberries x 15 g = 15 g. Add together all of the amounts to find the total grams of carbohydrates eaten: 30 g + 15 g + 15 g + 15 g = 75 g of carbohydrates total. Where to find  more information To learn more, go to: American Diabetes Association at diabetes.org. Click Search and type carb counting. Find the link you need. Centers for Disease Control and Prevention at tonerpromos.no. Click Search and type diabetes. Find the link you need. Academy of Nutrition and Dietetics: eatright.org This information is not intended to replace advice given to you by your health care provider. Make sure you discuss any questions you have with your health care provider. Document Revised: 05/14/2023 Document Reviewed: 05/14/2023 Elsevier Patient Education  2025 [employer].     [place]  Purcell, MD [facility]

## 2024-04-28 NOTE — Assessment & Plan Note (Signed)
 BP Readings from Last 3 Encounters:  04/28/24 124/80  04/05/24 116/74  10/29/23 122/82   Lab Results  Component Value Date   HGBA1C 6.4 04/28/2024  Well-controlled hypertension and diabetes Continue Zestoretic  20-12.5 mg daily Recommend to start Ozempic  weekly Cardiovascular risks associated with hypertension and diabetes discussed Diet and nutrition discussed Recommend follow-up in 3 months

## 2024-04-28 NOTE — Patient Instructions (Signed)

## 2024-04-28 NOTE — Assessment & Plan Note (Signed)
 Chronic stable conditions Diet and nutrition discussed Recommend weekly Ozempic and continue rosuvastatin  10 mg daily

## 2024-04-28 NOTE — Assessment & Plan Note (Signed)
 Well-controlled lower urinary tract symptoms Continues tamsulosin  0.8 milligrams at bedtime

## 2024-05-28 ENCOUNTER — Ambulatory Visit
Admission: EM | Admit: 2024-05-28 | Discharge: 2024-05-28 | Disposition: A | Discharge status: Home / Self Care | Attending: Family Medicine | Admitting: Family Medicine

## 2024-05-28 DIAGNOSIS — J40 Bronchitis, not specified as acute or chronic: Secondary | ICD-10-CM

## 2024-05-28 DIAGNOSIS — J329 Chronic sinusitis, unspecified: Secondary | ICD-10-CM

## 2024-05-28 MED ORDER — PROMETHAZINE-DM 6.25-15 MG/5ML PO SYRP: 5.0000 mL | ORAL_SOLUTION | Freq: Three times a day (TID) | ORAL | 0 refills | Status: AC | PRN

## 2024-05-28 MED ORDER — AMOXICILLIN-POT CLAVULANATE 875-125 MG PO TABS: 1.0000 | ORAL_TABLET | Freq: Two times a day (BID) | ORAL | 0 refills | Status: AC

## 2024-05-28 MED ORDER — PREDNISONE 20 MG PO TABS: ORAL_TABLET | ORAL | 0 refills | Status: AC

## 2024-05-28 NOTE — Discharge Instructions (Signed)
 Start prednisone  and Augmentin  to help with sinobronchitis. Use cough syrup as needed.

## 2024-05-28 NOTE — ED Triage Notes (Signed)
 Pt reports cough and chest congestion x 9 days. States he gets bronchitis around this time every year. Mucinex  gives no relief.

## 2024-05-28 NOTE — ED Provider Notes (Signed)
 " [facility]  Note:  This document was prepared using Conservation officer, historic buildings and may include unintentional dictation errors.  MRN: [id number] DOB: 1950-10-03  Subjective:   [name] [name] is a 73 y.o. male presenting for 9 day history of persistent dry cough, chest burning, slight wheezing, runny and stuffy nose, nasal burning, mild intermittent sinus headaches. No asthma but regularly gets bronchitis this time of year per patient. No fever, chest pain. No smoking of any kind including cigarettes, cigars, vaping, marijuana use.    Current Outpatient Medications  Medication Instructions   ALPRAZolam  (XANAX ) 0.5 MG tablet TAKE 1 TABLET BY MOUTH EVERY DAY AS NEEDED FOR ANXIETY   lisinopril -hydrochlorothiazide  (ZESTORETIC ) 20-12.5 MG tablet 1 tablet, Oral, Daily   meloxicam (MOBIC) 15 mg, As needed   Ozempic  (0.25 or 0.5 MG/DOSE) 0.25 mg, Subcutaneous, Weekly   rosuvastatin  (CRESTOR ) 10 MG tablet TAKE 1 TABLET BY MOUTH EVERY DAY   tamsulosin  (FLOMAX ) 0.8 mg, Daily   zolpidem  (AMBIEN ) 5 MG tablet TAKE 1 TABLET BY MOUTH EVERY DAY AT BEDTIME AS NEEDED FOR SLEEP    Allergies[1]  Past Medical History:  Diagnosis Date   Allergy    seasonal allergies   Arthritis    generalized   Blood transfusion without reported diagnosis 2013   when had diverticulitis flare   BPH (benign prostatic hypertrophy)    Colon polyp    diverticular bleed with transfusion   Diverticulosis    History of kidney stones    Hyperlipidemia    on meds   Hypertension    on meds   Kidney stones    hx of 15-20 kidney stones    Obesity, unspecified    Pre-diabetes    Prostate cancer (HCC) 2020   dx 05/2019   Prostate nodule 07/27/2008     Past Surgical History:  Procedure Laterality Date   ANKLE FUSION Left 1991   COLONOSCOPY  2011   DB-F/V-miralax (good)-TICS/3 HPP   CYSTOSCOPY W/ RETROGRADES Bilateral 08/02/2022   Procedure: CYSTOSCOPY WITH BILATERAL RETROGRADE PYELOGRAM;   Surgeon: Carolee Sherwood JONETTA DOUGLAS, MD;  Location: [facility];  Service: Urology;  Laterality: Bilateral;   HERNIA REPAIR N/A    umbilical    IR 3D INDEPENDENT WKST  04/07/2023   IR ANGIOGRAM PELVIS SELECTIVE OR SUPRASELECTIVE  04/07/2023   IR ANGIOGRAM SELECTIVE EACH ADDITIONAL VESSEL  04/07/2023   IR ANGIOGRAM SELECTIVE EACH ADDITIONAL VESSEL  04/07/2023   IR EMBO ARTERIAL NOT HEMORR HEMANG INC GUIDE ROADMAPPING  04/07/2023   IR RADIOLOGIST EVAL & MGMT  03/19/2023   IR RADIOLOGIST EVAL & MGMT  05/07/2023   IR RADIOLOGIST EVAL & MGMT  10/06/2023   IR US  GUIDE VASC ACCESS RIGHT  04/07/2023   POLYPECTOMY  2011   3 HPP   TOTAL KNEE ARTHROPLASTY Right 03/15/2022   Procedure: TOTAL KNEE ARTHROPLASTY;  Surgeon: Shari Sieving, MD;  Location: [facility];  Service: Orthopedics;  Laterality: Right;   TRANSURETHRAL RESECTION OF BLADDER TUMOR N/A 08/02/2022   Procedure: TRANSURETHRAL RESECTION OF BLADDER TUMOR (TURBT);  Surgeon: Carolee Sherwood JONETTA DOUGLAS, MD;  Location: [facility];  Service: Urology;  Laterality: N/A;  60 MINS   TRANSURETHRAL RESECTION OF PROSTATE  2020    Family History  Problem Relation Age of Onset   Diabetes Mother    Breast cancer Mother 31   Prostate cancer Father        patient did not have a relationship with his father but understands he had prostate ca  Colon polyps Father 61   Colon cancer Father 50   Diabetes Sister    Pancreatic cancer Neg Hx    Esophageal cancer Neg Hx    Stomach cancer Neg Hx    Rectal cancer Neg Hx     Social History   Occupational History   Occupation: Retired  Tobacco Use   Smoking status: Never   Smokeless tobacco: Never  Vaping Use   Vaping status: Never Used  Substance and Sexual Activity   Alcohol use: No   Drug use: No   Sexual activity: Yes     ROS   Objective:   Vitals: BP (!) 147/83 (BP Location: Left Arm)   Pulse 78   Temp 99.1 F (37.3 C) (Oral)   Resp 18   SpO2 94%   Physical Exam Constitutional:      General: He is not in  acute distress.    Appearance: Normal appearance. He is well-developed. He is obese. He is not ill-appearing, toxic-appearing or diaphoretic.  HENT:     Head: Normocephalic and atraumatic.     Right Ear: Tympanic membrane, ear canal and external ear normal. No drainage, swelling or tenderness. No middle ear effusion. There is no impacted cerumen. Tympanic membrane is not erythematous or bulging.     Left Ear: Tympanic membrane, ear canal and external ear normal. No drainage, swelling or tenderness.  No middle ear effusion. There is no impacted cerumen. Tympanic membrane is not erythematous or bulging.     Nose: Congestion and rhinorrhea present.     Mouth/Throat:     Mouth: Mucous membranes are moist.     Pharynx: No oropharyngeal exudate or posterior oropharyngeal erythema.  Eyes:     General: No scleral icterus.       Right eye: No discharge.        Left eye: No discharge.     Extraocular Movements: Extraocular movements intact.     Conjunctiva/sclera: Conjunctivae normal.  Cardiovascular:     Rate and Rhythm: Normal rate and regular rhythm.     Heart sounds: Normal heart sounds. No murmur heard.    No friction rub. No gallop.  Pulmonary:     Effort: Pulmonary effort is normal. No respiratory distress.     Breath sounds: No stridor. Rhonchi (trace over mid lung fields bilaterally) present. No wheezing or rales.  Musculoskeletal:     Cervical back: Normal range of motion and neck supple. No rigidity. No muscular tenderness.  Neurological:     General: No focal deficit present.     Mental Status: He is alert and oriented to person, place, and time.  Psychiatric:        Mood and Affect: Mood normal.        Behavior: Behavior normal.        Thought Content: Thought content normal.     Assessment and Plan :   PDMP not reviewed this encounter.  1. Sinobronchitis     Will cover for sinobronchitis with Augmentin  and prednisone . Patient has done well with these medications in the  past. Will defer imaging for now. Use supportive care otherwise. Counseled patient on potential for adverse effects with medications prescribed/recommended today, ER and return-to-clinic precautions discussed, patient verbalized understanding.      [1]  Allergies Allergen Reactions   Aspirin      REACTION: upsets stomach   Statins     REACTION: UPSETS STOMACH   Gadolinium Derivatives Nausea And Vomiting    Pt was given 20  ml multihance  and became nauseated and vomited several times.      Christopher Savannah, PA-C 05/28/24 1121  "

## 2024-06-16 ENCOUNTER — Other Ambulatory Visit: Payer: Self-pay | Admitting: Emergency Medicine

## 2024-06-16 DIAGNOSIS — I152 Hypertension secondary to endocrine disorders: Secondary | ICD-10-CM

## 2024-06-16 DIAGNOSIS — E785 Hyperlipidemia, unspecified: Secondary | ICD-10-CM

## 2024-06-16 MED ORDER — OZEMPIC (0.25 OR 0.5 MG/DOSE) 2 MG/3ML ~~LOC~~ SOPN: 0.2500 mg | PEN_INJECTOR | SUBCUTANEOUS | 3 refills | Status: AC

## 2024-06-16 MED ORDER — ROSUVASTATIN CALCIUM 10 MG PO TABS: 10.0000 mg | ORAL_TABLET | Freq: Every day | ORAL | 1 refills | Status: AC

## 2024-06-16 NOTE — Telephone Encounter (Signed)
 Copied from CRM #[id number]. Topic: Clinical - Medication Refill >> Jun 16, 2024 10:21 AM Suzen RAMAN wrote: Medication: Semaglutide ,0.25 or 0.5MG /DOS, (OZEMPIC , 0.25 OR 0.5 MG/DOSE,) 2 MG/3ML SOPN   Has the patient contacted their pharmacy? Yes; patient never picked up original scripts from 04/28/24.   This is the patient's preferred pharmacy:  [facility] [phone] [address], [place] - [address] [place] [address] Phone: [phone] Fax: [phone]  Is this the correct pharmacy for this prescription? Yes If no, delete pharmacy and type the correct one.   Has the prescription been filled recently? No  Is the patient out of the medication? Yes  Has the patient been seen for an appointment in the last year OR does the patient have an upcoming appointment? Yes  Can we respond through MyChart? Yes  Agent: Please be advised that Rx refills may take up to 3 business days. We ask that you follow-up with your pharmacy.

## 2024-06-16 NOTE — Telephone Encounter (Signed)
 Copied from CRM #[id number]. Topic: Clinical - Medication Refill >> Jun 16, 2024 10:21 AM Suzen RAMAN wrote: Medication: Semaglutide ,0.25 or 0.5MG /DOS, (OZEMPIC , 0.25 OR 0.5 MG/DOSE,) 2 MG/3ML SOPN   Has the patient contacted their pharmacy? Yes; patient never picked up original scripts from 04/28/24.   This is the patient's preferred pharmacy:  [facility] [phone] [address], [place] - [address] [place] [address] Phone: [phone] Fax: [phone]  Is this the correct pharmacy for this prescription? Yes If no, delete pharmacy and type the correct one.   Has the prescription been filled recently? No  Is the patient out of the medication? Yes  Has the patient been seen for an appointment in the last year OR does the patient have an upcoming appointment? Yes  Can we respond through MyChart? Yes  Agent: Please be advised that Rx refills may take up to 3 business days. We ask that you follow-up with your pharmacy. >> Jun 16, 2024 10:29 AM Suzen RAMAN wrote: Patient states he was never notified by the pharmacy that medication was available for pick up; so script was never picked up on 04/28/24. Patient reached out to pharmacy and was advised a new script needed to be sent.Patient advised to follow up with the pharmacy for the status of medication refill. >> Jun 16, 2024 10:25 AM Suzen RAMAN wrote: Patient also needs  rosuvastatin  (CRESTOR ) 10 MG tablet     Refilled... mentioned after CRM was almost routed.

## 2024-07-29 ENCOUNTER — Ambulatory Visit: Admitting: Emergency Medicine

## 2025-03-14 ENCOUNTER — Ambulatory Visit
